# Patient Record
Sex: Female | Born: 1935
Health system: Southern US, Community
[De-identification: ages and names within clinical notes are randomized; demographics above are authoritative.]

## PROBLEM LIST (undated history)

## (undated) DIAGNOSIS — K589 Irritable bowel syndrome without diarrhea: Secondary | ICD-10-CM

## (undated) DIAGNOSIS — M1990 Unspecified osteoarthritis, unspecified site: Secondary | ICD-10-CM

## (undated) DIAGNOSIS — C50919 Malignant neoplasm of unspecified site of unspecified female breast: Secondary | ICD-10-CM

## (undated) DIAGNOSIS — K52831 Collagenous colitis: Secondary | ICD-10-CM

## (undated) DIAGNOSIS — I10 Essential (primary) hypertension: Secondary | ICD-10-CM

## (undated) DIAGNOSIS — H3530 Unspecified macular degeneration: Secondary | ICD-10-CM

## (undated) DIAGNOSIS — E039 Hypothyroidism, unspecified: Secondary | ICD-10-CM

## (undated) DIAGNOSIS — I809 Phlebitis and thrombophlebitis of unspecified site: Secondary | ICD-10-CM

## (undated) DIAGNOSIS — B029 Zoster without complications: Secondary | ICD-10-CM

## (undated) DIAGNOSIS — G629 Polyneuropathy, unspecified: Secondary | ICD-10-CM

## (undated) DIAGNOSIS — N951 Menopausal and female climacteric states: Secondary | ICD-10-CM

## (undated) DIAGNOSIS — C50911 Malignant neoplasm of unspecified site of right female breast: Secondary | ICD-10-CM

## (undated) DIAGNOSIS — R002 Palpitations: Secondary | ICD-10-CM

## (undated) DIAGNOSIS — E785 Hyperlipidemia, unspecified: Secondary | ICD-10-CM

## (undated) DIAGNOSIS — R9431 Abnormal electrocardiogram [ECG] [EKG]: Secondary | ICD-10-CM

## (undated) DIAGNOSIS — K5730 Diverticulosis of large intestine without perforation or abscess without bleeding: Secondary | ICD-10-CM

## (undated) DIAGNOSIS — K644 Residual hemorrhoidal skin tags: Secondary | ICD-10-CM

## (undated) HISTORY — DX: Polyneuropathy, unspecified: G62.9

## (undated) HISTORY — DX: Phlebitis and thrombophlebitis of unspecified site: I80.9

## (undated) HISTORY — DX: Palpitations: R00.2

## (undated) HISTORY — DX: Residual hemorrhoidal skin tags: K64.4

## (undated) HISTORY — PX: CARPAL TUNNEL RELEASE: SHX101

## (undated) HISTORY — DX: Abnormal electrocardiogram (ECG) (EKG): R94.31

## (undated) HISTORY — DX: Unspecified osteoarthritis, unspecified site: M19.90

## (undated) HISTORY — DX: Zoster without complications: B02.9

## (undated) HISTORY — DX: Collagenous colitis: K52.831

## (undated) HISTORY — PX: TONSILLECTOMY AND ADENOIDECTOMY: SHX28

## (undated) HISTORY — PX: BACK SURGERY: SHX140

## (undated) HISTORY — PX: VAGINAL HYSTERECTOMY: SUR661

## (undated) HISTORY — DX: Unspecified macular degeneration: H35.30

## (undated) HISTORY — DX: Essential (primary) hypertension: I10

## (undated) HISTORY — DX: Hypothyroidism, unspecified: E03.9

## (undated) HISTORY — DX: Diverticulosis of large intestine without perforation or abscess without bleeding: K57.30

## (undated) HISTORY — DX: Malignant neoplasm of unspecified site of unspecified female breast: C50.919

## (undated) HISTORY — PX: NEUROPLASTY / TRANSPOSITION MEDIAN NERVE AT CARPAL TUNNEL BILATERAL: SUR894

## (undated) HISTORY — DX: Hyperlipidemia, unspecified: E78.5

## (undated) HISTORY — PX: BREAST LUMPECTOMY: SHX2

## (undated) HISTORY — DX: Irritable bowel syndrome, unspecified: K58.9

## (undated) HISTORY — DX: Malignant neoplasm of unspecified site of right female breast: C50.911

## (undated) HISTORY — DX: Menopausal and female climacteric states: N95.1

---

## 1999-03-14 ENCOUNTER — Other Ambulatory Visit: Admission: RE | Admit: 1999-03-14 | Discharge: 1999-03-14 | Payer: Self-pay | Admitting: Gastroenterology

## 2001-03-25 ENCOUNTER — Ambulatory Visit (HOSPITAL_COMMUNITY): Admission: RE | Admit: 2001-03-25 | Discharge: 2001-03-25 | Payer: Self-pay | Admitting: Family Medicine

## 2001-03-25 ENCOUNTER — Encounter: Payer: Self-pay | Admitting: Family Medicine

## 2001-06-06 ENCOUNTER — Encounter: Admission: RE | Admit: 2001-06-06 | Discharge: 2001-07-03 | Payer: Self-pay | Admitting: Neurosurgery

## 2001-12-10 ENCOUNTER — Encounter: Admission: RE | Admit: 2001-12-10 | Discharge: 2001-12-26 | Payer: Self-pay | Admitting: Internal Medicine

## 2002-11-06 LAB — HM COLONOSCOPY

## 2002-12-03 HISTORY — PX: COLONOSCOPY: SHX174

## 2004-08-03 ENCOUNTER — Encounter: Payer: Self-pay | Admitting: Family Medicine

## 2004-09-27 ENCOUNTER — Encounter: Admission: RE | Admit: 2004-09-27 | Discharge: 2004-11-29 | Payer: Self-pay | Admitting: Family Medicine

## 2005-10-30 ENCOUNTER — Encounter
Admission: RE | Admit: 2005-10-30 | Discharge: 2005-12-03 | Payer: Self-pay | Admitting: Physical Medicine and Rehabilitation

## 2005-12-04 ENCOUNTER — Encounter
Admission: RE | Admit: 2005-12-04 | Discharge: 2006-01-04 | Payer: Self-pay | Admitting: Physical Medicine and Rehabilitation

## 2008-07-14 ENCOUNTER — Encounter: Admission: RE | Admit: 2008-07-14 | Discharge: 2008-08-06 | Payer: Self-pay | Admitting: Family Medicine

## 2008-08-07 ENCOUNTER — Encounter: Admission: RE | Admit: 2008-08-07 | Discharge: 2008-09-22 | Payer: Self-pay | Admitting: Family Medicine

## 2008-08-13 ENCOUNTER — Encounter: Admission: RE | Admit: 2008-08-13 | Discharge: 2008-08-13 | Payer: Self-pay | Admitting: General Surgery

## 2008-09-08 ENCOUNTER — Encounter (INDEPENDENT_AMBULATORY_CARE_PROVIDER_SITE_OTHER): Payer: Self-pay | Admitting: General Surgery

## 2008-09-08 ENCOUNTER — Encounter: Admission: RE | Admit: 2008-09-08 | Discharge: 2008-09-08 | Payer: Self-pay | Admitting: General Surgery

## 2008-09-08 ENCOUNTER — Ambulatory Visit (HOSPITAL_COMMUNITY): Admission: RE | Admit: 2008-09-08 | Discharge: 2008-09-08 | Payer: Self-pay | Admitting: General Surgery

## 2008-10-12 ENCOUNTER — Ambulatory Visit: Admission: RE | Admit: 2008-10-12 | Discharge: 2008-12-01 | Payer: Self-pay | Admitting: Radiation Oncology

## 2008-11-05 LAB — HM PAP SMEAR

## 2010-08-02 LAB — HM MAMMOGRAPHY

## 2010-09-12 ENCOUNTER — Ambulatory Visit: Payer: MEDICARE | Attending: Family Medicine | Admitting: Physical Therapy

## 2010-09-12 DIAGNOSIS — R5381 Other malaise: Secondary | ICD-10-CM | POA: Insufficient documentation

## 2010-09-12 DIAGNOSIS — M6281 Muscle weakness (generalized): Secondary | ICD-10-CM | POA: Insufficient documentation

## 2010-09-12 DIAGNOSIS — R293 Abnormal posture: Secondary | ICD-10-CM | POA: Insufficient documentation

## 2010-09-12 DIAGNOSIS — IMO0001 Reserved for inherently not codable concepts without codable children: Secondary | ICD-10-CM | POA: Insufficient documentation

## 2010-09-16 ENCOUNTER — Ambulatory Visit: Payer: MEDICARE | Admitting: *Deleted

## 2010-09-20 ENCOUNTER — Ambulatory Visit: Payer: MEDICARE | Admitting: Physical Therapy

## 2010-09-22 ENCOUNTER — Ambulatory Visit: Payer: MEDICARE | Admitting: Physical Therapy

## 2010-09-26 ENCOUNTER — Ambulatory Visit: Payer: MEDICARE | Admitting: Physical Therapy

## 2010-09-29 ENCOUNTER — Ambulatory Visit: Payer: MEDICARE | Admitting: Physical Therapy

## 2010-10-03 ENCOUNTER — Ambulatory Visit: Payer: MEDICARE | Admitting: Physical Therapy

## 2010-10-06 ENCOUNTER — Ambulatory Visit: Payer: MEDICARE | Attending: Family Medicine | Admitting: Physical Therapy

## 2010-10-06 DIAGNOSIS — M6281 Muscle weakness (generalized): Secondary | ICD-10-CM | POA: Insufficient documentation

## 2010-10-06 DIAGNOSIS — R5381 Other malaise: Secondary | ICD-10-CM | POA: Insufficient documentation

## 2010-10-06 DIAGNOSIS — IMO0001 Reserved for inherently not codable concepts without codable children: Secondary | ICD-10-CM | POA: Insufficient documentation

## 2010-10-06 DIAGNOSIS — R293 Abnormal posture: Secondary | ICD-10-CM | POA: Insufficient documentation

## 2010-10-10 ENCOUNTER — Ambulatory Visit: Payer: MEDICARE | Admitting: Physical Therapy

## 2010-10-12 ENCOUNTER — Ambulatory Visit: Payer: MEDICARE | Admitting: Physical Therapy

## 2010-10-17 ENCOUNTER — Ambulatory Visit: Payer: MEDICARE | Admitting: Physical Therapy

## 2010-10-20 ENCOUNTER — Ambulatory Visit: Payer: MEDICARE | Admitting: Physical Therapy

## 2010-10-24 ENCOUNTER — Ambulatory Visit: Payer: MEDICARE | Admitting: Physical Therapy

## 2010-10-26 ENCOUNTER — Ambulatory Visit: Payer: MEDICARE | Admitting: Physical Therapy

## 2010-10-31 ENCOUNTER — Ambulatory Visit: Payer: MEDICARE | Admitting: Physical Therapy

## 2010-11-03 ENCOUNTER — Ambulatory Visit: Payer: MEDICARE | Admitting: Physical Therapy

## 2010-11-05 ENCOUNTER — Encounter: Payer: Self-pay | Admitting: Family Medicine

## 2010-11-05 DIAGNOSIS — K529 Noninfective gastroenteritis and colitis, unspecified: Secondary | ICD-10-CM

## 2010-11-05 DIAGNOSIS — I809 Phlebitis and thrombophlebitis of unspecified site: Secondary | ICD-10-CM | POA: Insufficient documentation

## 2010-11-05 DIAGNOSIS — E039 Hypothyroidism, unspecified: Secondary | ICD-10-CM

## 2010-11-05 DIAGNOSIS — E785 Hyperlipidemia, unspecified: Secondary | ICD-10-CM | POA: Insufficient documentation

## 2010-11-05 DIAGNOSIS — M810 Age-related osteoporosis without current pathological fracture: Secondary | ICD-10-CM | POA: Insufficient documentation

## 2010-11-05 DIAGNOSIS — R9431 Abnormal electrocardiogram [ECG] [EKG]: Secondary | ICD-10-CM

## 2010-11-05 DIAGNOSIS — R5383 Other fatigue: Secondary | ICD-10-CM | POA: Insufficient documentation

## 2010-11-05 DIAGNOSIS — R002 Palpitations: Secondary | ICD-10-CM | POA: Insufficient documentation

## 2010-11-05 DIAGNOSIS — K589 Irritable bowel syndrome without diarrhea: Secondary | ICD-10-CM | POA: Insufficient documentation

## 2010-11-05 DIAGNOSIS — N951 Menopausal and female climacteric states: Secondary | ICD-10-CM | POA: Insufficient documentation

## 2010-11-05 DIAGNOSIS — I119 Hypertensive heart disease without heart failure: Secondary | ICD-10-CM | POA: Insufficient documentation

## 2010-11-21 LAB — DIFFERENTIAL
Basophils Absolute: 0.1 10*3/uL (ref 0.0–0.1)
Basophils Relative: 2 % — ABNORMAL HIGH (ref 0–1)
Lymphocytes Relative: 27 % (ref 12–46)
Neutro Abs: 3.6 10*3/uL (ref 1.7–7.7)
Neutrophils Relative %: 56 % (ref 43–77)

## 2010-11-21 LAB — URINALYSIS, ROUTINE W REFLEX MICROSCOPIC
Nitrite: NEGATIVE
Protein, ur: NEGATIVE mg/dL
Specific Gravity, Urine: 1.01 (ref 1.005–1.030)
Urobilinogen, UA: 0.2 mg/dL (ref 0.0–1.0)

## 2010-11-21 LAB — CBC
HCT: 46.3 % — ABNORMAL HIGH (ref 36.0–46.0)
Hemoglobin: 15.1 g/dL — ABNORMAL HIGH (ref 12.0–15.0)
MCV: 95.2 fL (ref 78.0–100.0)
Platelets: 199 10*3/uL (ref 150–400)
RDW: 13.7 % (ref 11.5–15.5)

## 2010-11-21 LAB — URINE MICROSCOPIC-ADD ON

## 2010-11-21 LAB — CANCER ANTIGEN 27.29: CA 27.29: 23 U/mL (ref 0–39)

## 2010-11-21 LAB — COMPREHENSIVE METABOLIC PANEL
Albumin: 4 g/dL (ref 3.5–5.2)
Alkaline Phosphatase: 88 U/L (ref 39–117)
BUN: 16 mg/dL (ref 6–23)
Creatinine, Ser: 0.94 mg/dL (ref 0.4–1.2)
Glucose, Bld: 103 mg/dL — ABNORMAL HIGH (ref 70–99)
Potassium: 4.3 mEq/L (ref 3.5–5.1)
Total Bilirubin: 0.8 mg/dL (ref 0.3–1.2)
Total Protein: 7.3 g/dL (ref 6.0–8.3)

## 2010-12-20 NOTE — Op Note (Signed)
NAMEMAYGEN, [name], M.D.DATE OF BIRTH:  Nov 02, 1921   DATE OF PROCEDURE:  09/08/2008  DATE OF DISCHARGE:  09/08/2008                               OPERATIVE REPORT   PREOPERATIVE DIAGNOSIS:  Invasive breast cancer, right breast.   POSTOPERATIVE DIAGNOSIS:  Invasive breast cancer, right breast.   OPERATION PERFORMED:  1. Inject blue dye, right breast.  2. Right partial mastectomy with needle localization.  3. Right axillary sentinel lymph node biopsy.   SURGEON:  Angelia Mould. Derrell Lolling, MD   OPERATIVE INDICATIONS:  This is a fairly healthy 77 year old white  female who had a screening mammogram which identified an 8-mm density  with calcifications in the right breast at 10 o'clock position, 3 cm  from the nipple.  Further imaging studies were done.  Ultrasound-guided  biopsy was performed and it showed invasive ductal carcinoma which  turned out to be estrogen receptor-positive.  She subsequently had an  MRI which suggested a 1-cm spiculated mass at 10 o'clock position in the  right breast and also some enhancement contiguous with an extending 1.7  cm deep to the superior margin of the mass, suspicious for DCIS.  There  was no axillary or internal mammary chain lymphadenopathy and the breast  abnormality was a solitary finding.  I counseled her extensively as an  outpatient on 2 separate occasions.  She clearly wanted to have breast  conservation surgery.  I felt that was appropriate.  She is brought to  operating room electively.   OPERATIVE TECHNIQUE:  The patient underwent needle localization by Dr.  Cain Saupe at the [facility] this morning and that  wire was very satisfactorily placed.  She was brought to [facility]  where she underwent injection of the right  retroareolar area with  radionuclide by the nuclear medicine technician.  She was taken to the  operating room and underwent general anesthesia.  After an alcohol prep,  we injected the right retroareolar area with 5 mL of blue dye which was  2 mL of methylene blue mixed with 3 mL of saline.  The breast was  massaged for 5 minutes.  we then held surgical time-out identifying the  correct patient, correct procedure, and correct site.  The right chest,  breast wall, axilla, and shoulder were then prepped and draped in a  sterile fashion.  Marcaine 0.5% with epinephrine was used a local  infiltration anesthetic.   We used a NeoProbe and we identified an area of increased radioactivity  very low in the right axilla.  We made a transverse incision overlying  this area and dissected down through the subcutaneous tissue.  We  incised the clavipectoral fascia and on further exploration, we found a  single small lymph node probably less than 5 mm in diameter.  This was  the only sentinel lymph node that we could identify.  We sent it to the  lab.  Dr. Colonel Bald did frozen section and said this was negative for cancer  cells.   I then planned the breast incision.  The localizing wire was inserted  laterally and directed superiorly.  I marked and then made a curved  incision in the outer aspect of the right breast from about the 9  o'clock to about 11 o'clock position.  This was in general parallel to  the areolar margin.  Dissection was carried down into the breast tissue  around localizing wire.  Because of the MRI findings suggesting DCIS, I  took the dissection all the way down to the pectoralis fascia.  The  specimen was marked with the six color margin marker kit.  The specimen  was sent to Dr. Deboraha Sprang at the [facility] [place].  Dr. Deboraha Sprang  called back and stated that the specimen looked good and that we seemed  to have everything.  Specimen was then sent to pathology.   Both incisions  were irrigated with saline.  Hemostasis was excellent in  both incisions.  In the axillary incision, we closed the clavipectoral  fascia and deep subcutaneous tissue with interrupted sutures of 3-0  Vicryl and the skin with a running suture of 4-0 Monocryl and Steri-  Strips.  In the breast incisions, we only closed the superficial  subcutaneous tissue with interrupted sutures of 3-0 Vicryl and the skin  with a running subcuticular suture of 4-0 Monocryl and Steri-Strips.  Clean bandages were placed.  A 6-inch Ace wrap was placed around the  chest.  The patient tolerated the procedure well and was taken recovery  room in stable condition.  Estimated blood loss was about 20 mL.  Complications were none.  Sponge, needle, and instrument counts were  correct.      Angelia Mould. Derrell Lolling, M.D.  Electronically Signed     HMI/MEDQ  D:  09/08/2008  T:  09/09/2008  Job:  [id number]   cc:   Ernestina Penna, M.D.

## 2010-12-27 ENCOUNTER — Encounter (INDEPENDENT_AMBULATORY_CARE_PROVIDER_SITE_OTHER): Payer: Self-pay | Admitting: General Surgery

## 2010-12-27 DIAGNOSIS — M1990 Unspecified osteoarthritis, unspecified site: Secondary | ICD-10-CM

## 2010-12-27 DIAGNOSIS — H9190 Unspecified hearing loss, unspecified ear: Secondary | ICD-10-CM | POA: Insufficient documentation

## 2010-12-27 DIAGNOSIS — H6690 Otitis media, unspecified, unspecified ear: Secondary | ICD-10-CM

## 2010-12-27 DIAGNOSIS — N63 Unspecified lump in unspecified breast: Secondary | ICD-10-CM

## 2011-08-31 ENCOUNTER — Ambulatory Visit (INDEPENDENT_AMBULATORY_CARE_PROVIDER_SITE_OTHER): Payer: Self-pay | Admitting: General Surgery

## 2011-09-26 ENCOUNTER — Ambulatory Visit (INDEPENDENT_AMBULATORY_CARE_PROVIDER_SITE_OTHER): Payer: Medicare Other | Admitting: General Surgery

## 2011-09-28 ENCOUNTER — Ambulatory Visit (INDEPENDENT_AMBULATORY_CARE_PROVIDER_SITE_OTHER): Payer: Medicare Other | Admitting: General Surgery

## 2011-09-28 ENCOUNTER — Encounter (INDEPENDENT_AMBULATORY_CARE_PROVIDER_SITE_OTHER): Payer: Self-pay | Admitting: General Surgery

## 2011-09-28 VITALS — BP 138/80 | HR 90 | Temp 99.0°F | Ht 63.5 in | Wt 121.0 lb

## 2011-09-28 DIAGNOSIS — C50911 Malignant neoplasm of unspecified site of right female breast: Secondary | ICD-10-CM

## 2011-09-28 DIAGNOSIS — C50919 Malignant neoplasm of unspecified site of unspecified female breast: Secondary | ICD-10-CM

## 2011-09-28 HISTORY — DX: Malignant neoplasm of unspecified site of right female breast: C50.911

## 2011-09-28 NOTE — Progress Notes (Signed)
Patient ID: Janice Reed, female   DOB: [birth year], 78 y.o.   MRN: [id number]  Chief Complaint  Patient presents with  . Breast Cancer Long Term Follow Up    R. PM/SLN 09/08/2008;   T2,N0 (IHC+), ER+, Her-2 neg.    HPI Janice Reed is a 78 y.o. female.  She returns for long-term followup of her right breast cancer.  This patient was originally diagnosed with invasive cancer in the right breast 3 years ago. She underwent right partial mastectomy and sentinel lobe biopsy. Date of surgery was September 08, 2008. Final pathology report revealed a T2, N0 (IHC-positive), receptor positive, HER-2-negative, 2.1 cm tumor. She had adjuvant radiation therapy. Dr. Cleone Slim has been following her. He elected not to treat her with antiestrogen therapy. She has done well and has no known recurrence to date.  She has no complaints about her breast. Her health has been stable. She is followed by [facility].  Mammograms performed at the right center in the evening on August 15, 2011 looked fine. HPI  Past Medical History  Diagnosis Date  . Benign hypertensive heart disease   . Other and unspecified hyperlipidemia   . Prolapse of vaginal walls without mention of uterine prolapse   . Phlebitis and thrombophlebitis of unspecified site   . Symptomatic menopausal or female climacteric states   . Malaise and fatigue   . Palpitations   . Nonspecific abnormal electrocardiogram (ECG) (EKG)   . Colitis   . Unspecified hypothyroidism   . Osteoporosis   . IBS (irritable bowel syndrome)   . Arthritis   . Cancer   . Hypertension   . Hearing loss   . Leg swelling   . Cough   . Arthritis pain     Past Surgical History  Procedure Date  . Back surgery   . Vaginal hysterectomy   . Tonsilectomy, adenoidectomy, bilateral myringotomy and tubes   . Neuroplasty / transposition median nerve at carpal tunnel bilateral   . Breast lumpectomy     per medical history form dated 09/27/09.    Family History  Problem Relation  Age of Onset  . Heart disease Mother     Heart failure per medical history form dated 09/27/09.  [name] Kitchen Heart disease Father     Heart attack per medical history form dated 09/27/09.  [name] Kitchen Heart disease Brother     Heart failure per medical history form dated 09/27/09.  . Stroke Brother   . Stroke Brother   . Leukemia Brother     Social History History  Substance Use Topics  . Smoking status: Never Smoker   . Smokeless tobacco: Not on file  . Alcohol Use: No    Allergies  Allergen Reactions  . Benicar (Olmesartan Medoxomil)   . Biaxin   . Celebrex (Celecoxib)   . Fosamax   . Penicillins     Current Outpatient Prescriptions  Medication Sig Dispense Refill  . aspirin 81 MG EC tablet Take 81 mg by mouth daily.       [name] Kitchen atorvastatin (LIPITOR) 20 MG tablet Take 20 mg by mouth daily.        . benazepril (LOTENSIN) 10 MG tablet Take 10 mg by mouth daily.        . Calcium-Magnesium-Vitamin D (CITRACAL CALCIUM+D PO) Take 1 capsule by mouth 2 (two) times daily.        . cholecalciferol (VITAMIN D) 400 UNITS TABS Take 1,000 Units by mouth daily.      [name] Kitchen levothyroxine (SYNTHROID,  LEVOTHROID) 100 MCG tablet Take 100 mcg by mouth daily.        . Multiple Vitamins-Minerals (ICAPS AREDS FORMULA PO) Take by mouth 2 (two) times daily.      . Omega-3 Fatty Acids (FISH OIL) 1000 MG CAPS Take 2 capsules by mouth daily.        . risedronate (ACTONEL) 35 MG tablet Take 35 mg by mouth every 7 (seven) days. with water on empty stomach, nothing by mouth or lie down for next 30 minutes.       . triamterene-hydrochlorothiazide (DYAZIDE) 37.5-25 MG per capsule Take 1 capsule by mouth. Take 1/2 tab qam         Review of Systems Review of Systems  Constitutional: Negative for fever, chills and unexpected weight change.  HENT: Negative for hearing loss, congestion, sore throat, trouble swallowing and voice change.   Eyes: Negative for visual disturbance.  Respiratory: Negative for cough and wheezing.     Cardiovascular: Negative for chest pain, palpitations and leg swelling.  Gastrointestinal: Negative for nausea, vomiting, abdominal pain, diarrhea, constipation, blood in stool, abdominal distention and anal bleeding.  Genitourinary: Negative for hematuria, vaginal bleeding and difficulty urinating.  Musculoskeletal: Negative for arthralgias.  Skin: Negative for rash and wound.  Neurological: Negative for seizures, syncope and headaches.  Hematological: Negative for adenopathy. Does not bruise/bleed easily.  Psychiatric/Behavioral: Negative for confusion.    Blood pressure 138/80, pulse 90, temperature 99 F (37.2 C), temperature source Temporal, height 5' 3.5" (1.613 m), weight 121 lb (54.885 kg), SpO2 97.00%.  Physical Exam Physical Exam  Constitutional: She is oriented to person, place, and time. She appears well-developed and well-nourished. No distress.  HENT:  Head: Normocephalic and atraumatic.  Nose: Nose normal.  Neck: Neck supple. No JVD present. No tracheal deviation present. No thyromegaly present.  Cardiovascular: Normal rate, regular rhythm, normal heart sounds and intact distal pulses.   No murmur heard. Pulmonary/Chest: Effort normal and breath sounds normal. No respiratory distress. She has no wheezes. She has no rales. She exhibits no tenderness.    Musculoskeletal: She exhibits no edema and no tenderness.  Lymphadenopathy:    She has no cervical adenopathy.  Neurological: She is alert and oriented to person, place, and time. She exhibits normal muscle tone. Coordination normal.  Skin: Skin is warm. No rash noted. She is not diaphoretic. No erythema. No pallor.  Psychiatric: She has a normal mood and affect. Her behavior is normal. Judgment and thought content normal.    Data Reviewed I reviewed all of my old records, Dr. [name] Stakes recent note, and the mammogram report. Assessment    Invasive ductal carcinoma right breast, 2.1 cm, receptor positive,  HER-2-negative, pathologic stage T2, N0;   (IHC positive).  Status post right partial mastectomy and sentinel node biopsy September 08, 2008. No evidence of recurrence 3 years post op  S/p adjuvant radiation therapy.    Plan    Continue medical followup with Dr. Rudi Heap.  Continue medical oncology followup with Dr. Margo Common.  Return to see me in one year after you get her annual mammograms.       Angelia Mould. Derrell Lolling, M.D., [facility] General and Minimally invasive Surgery Breast and Colorectal Surgery Office:   [phone] Pager:   [phone]  09/28/2011, 10:24 AM

## 2011-09-28 NOTE — Patient Instructions (Signed)
Your physical exam and your mammograms are normal. There is no evidence of cancer.  Return to see me in one year after you get your annual mammograms.

## 2011-12-08 ENCOUNTER — Encounter: Payer: Self-pay | Admitting: Internal Medicine

## 2012-01-05 ENCOUNTER — Encounter: Payer: Self-pay | Admitting: Internal Medicine

## 2012-01-05 ENCOUNTER — Ambulatory Visit (INDEPENDENT_AMBULATORY_CARE_PROVIDER_SITE_OTHER): Payer: Medicare Other | Admitting: Internal Medicine

## 2012-01-05 ENCOUNTER — Ambulatory Visit: Payer: Medicare Other | Admitting: Internal Medicine

## 2012-01-05 VITALS — BP 150/70 | HR 72 | Ht 63.5 in | Wt 123.8 lb

## 2012-01-05 DIAGNOSIS — R195 Other fecal abnormalities: Secondary | ICD-10-CM

## 2012-01-05 NOTE — Patient Instructions (Signed)
Dr. Leone Payor recommends a colonoscopy and understands that you want to discuss this with your family first.  If you decide to do a colonoscopy call us back and we can set up a pre-visit to go over the details.  We are giving you a colonoscopy handout today.

## 2012-01-05 NOTE — Progress Notes (Signed)
Subjective:    Patient ID: Janice Reed, female    DOB: 10/15/1921, 78 y.o.   MRN: [id number]  HPI This is a delightful widowed elderly white woman who was found to have heme positive stool. She actually had immune based fecal occult blood testing in January of this year, and it was realized some month later that she had heme positive stool. A repeat immune fecal occult blood test was also positive. She has some hemorrhoids but does not note any rectal bleeding or melena. There is rare constipation. She is known to me from previous colonoscopy and a diagnosis of collagenous colitis as well as having diverticulosis and external hemorrhoids. There is no significant change in bowel habits. Sometimes she has some postprandial abdominal bloating and gas. The review of systems is otherwise negative.  She is otherwise doing reasonably well with a diagnosis of breast cancer about 3 years ago. She still lives at home alone and tries her own car. Allergies  Allergen Reactions  . Alendronate Sodium   . Benicar (Olmesartan Medoxomil)   . Celebrex (Celecoxib)   . Clarithromycin   . Penicillins    Outpatient Prescriptions Prior to Visit  Medication Sig Dispense Refill  . atorvastatin (LIPITOR) 20 MG tablet Take 20 mg by mouth daily.        . benazepril (LOTENSIN) 10 MG tablet Take 10 mg by mouth daily.        . Calcium-Magnesium-Vitamin D (CITRACAL CALCIUM+D PO) Take 1 capsule by mouth 2 (two) times daily.        [name] Kitchen levothyroxine (SYNTHROID, LEVOTHROID) 100 MCG tablet Take 100 mcg by mouth daily.        . Multiple Vitamins-Minerals (ICAPS AREDS FORMULA PO) Take by mouth 2 (two) times daily.      . Omega-3 Fatty Acids (FISH OIL) 1000 MG CAPS Take 2 capsules by mouth daily.        . risedronate (ACTONEL) 35 MG tablet Take 35 mg by mouth every 7 (seven) days. with water on empty stomach, nothing by mouth or lie down for next 30 minutes.       . triamterene-hydrochlorothiazide (DYAZIDE) 37.5-25 MG per capsule  Take 1 capsule by mouth. Take 1/2 tab qam       . aspirin 81 MG EC tablet Take 81 mg by mouth daily.       . cholecalciferol (VITAMIN D) 400 UNITS TABS Take 1,000 Units by mouth daily.       Past Medical History  Diagnosis Date  . Benign hypertensive heart disease   . Other and unspecified hyperlipidemia   . Prolapse of vaginal walls without mention of uterine prolapse   . Phlebitis and thrombophlebitis of unspecified site   . Symptomatic menopausal or female climacteric states   . Palpitations   . Nonspecific abnormal electrocardiogram (ECG) (EKG)   . Collagenous colitis   . Unspecified hypothyroidism   . Osteoporosis   . IBS (irritable bowel syndrome)   . Arthritis   . Breast cancer   . Hypertension   . Hearing loss   . Arthritis pain   . Neuropathy, peripheral   . Diverticulosis of colon   . External hemorrhoid   . Macular degeneration of both eyes    Past Surgical History  Procedure Date  . Back surgery   . Vaginal hysterectomy   . Tonsilectomy, adenoidectomy, bilateral myringotomy and tubes   . Neuroplasty / transposition median nerve at carpal tunnel bilateral   . Breast lumpectomy  per medical history form dated 09/27/09.  . Colonoscopy 12/03/2002    Dr. Stan Head  . Carpal tunnel release     bilateral   History   Social History  . Marital Status: Widowed    Spouse Name: N/A    Number of Children: 2  .     Occupational History  . retired-homemaker    Social History Main Topics  . Smoking status: Never Smoker   . Smokeless tobacco: None  . Alcohol Use: No  . Drug Use: No    Social History Narrative   Widowed, 1 son one daughter, retired and homemaker. No alcohol or caffeine or tobacco.   Family History  Problem Relation Age of Onset  . Heart disease Mother     Heart failure per medical history form dated 09/27/09.  [name] Kitchen Heart disease Father     Heart attack per medical history form dated 09/27/09.  [name] Kitchen Heart disease Brother     Heart failure per  medical history form dated 09/27/09.  . Stroke Brother   . Stroke Brother   . Leukemia Brother   . Colon cancer Neg Hx   . Breast cancer Cousin         Review of Systems Positive for reduced vision from macular degeneration, decreased hearing, some pedal edema, some muscle cramps, osteoarthritis symptoms and back pain. All other review of systems are negative or as per history of present illness.    Objective:   Physical Exam General:  Well-developed, well-nourished and in no acute distress Eyes:  anicteric. Other than arcus bilaterally ENT:   Mouth and posterior pharynx free of lesions. He does have dentures. Neck:   supple w/o thyromegaly or mass.  Lungs: Clear to auscultation bilaterally. Heart:  S1S2, no rubs, murmurs, gallops. Abdomen:  soft, non-tender, no hepatosplenomegaly, hernia, or mass and BS+.  Rectal: With female staff present there is no rectal mass, there are no obvious swollen hemorrhoids. Stool is brown. Lymph:  no cervical or supraclavicular adenopathy. Extremities:   no edema Skin   no rash. Neuro:  A&O x 3.  Psych:  appropriate mood and  Affect.   Data Reviewed: Immune fecal occult blood testing from 12-2011 is positive. Hemoglobin is 15 MCV is 94.7 on April 29. White count and platelets are normal.       Assessment & Plan:   1.  immune fecal occult blood test positive x2    I have recommended a colonoscopy to understand the cause of her heme positive stool. The test she has had is specific to a colonic source of hemoglobin. I've advised her of the risks and benefits of the procedure including bleeding, perforation, possible need for surgery, infection, medication reaction. She is elderly but overall fit. I think she would tolerate the procedure well though certainly can't guarantee that. However given that she said to heme positive stools, there could be a significant lesion other than hemorrhoids causing this and it could be helpful to detect this and  provide therapy.  She wants to discuss colonoscopy with her family prior to proceeding. She will think about it. I also mentioned the possibility of a CT colonoscopy though that would probably cost around $700 out of pocket. It is a reasonable option though it could still lead to an endoscopic evaluation.  She understands she could have colorectal cancer causing a heme positive stool.  I appreciate the opportunity to care for this patient.   CC: Rudi Heap, MD

## 2012-01-08 ENCOUNTER — Ambulatory Visit: Payer: Medicare Other | Admitting: Gastroenterology

## 2012-02-19 ENCOUNTER — Ambulatory Visit (INDEPENDENT_AMBULATORY_CARE_PROVIDER_SITE_OTHER): Payer: Medicare Other | Admitting: Internal Medicine

## 2012-02-19 ENCOUNTER — Ambulatory Visit: Payer: Medicare Other | Admitting: Gastroenterology

## 2012-02-19 ENCOUNTER — Encounter: Payer: Self-pay | Admitting: Internal Medicine

## 2012-02-19 VITALS — BP 144/80 | HR 64 | Ht 63.5 in | Wt 121.0 lb

## 2012-02-19 DIAGNOSIS — R195 Other fecal abnormalities: Secondary | ICD-10-CM

## 2012-02-19 MED ORDER — MOVIPREP 100 G PO SOLR: ORAL | Status: DC

## 2012-02-19 NOTE — Patient Instructions (Addendum)
You have been scheduled for a colonoscopy with propofol. Please follow written instructions given to you at your visit today.  Please pick up your prep kit at the pharmacy within the next 1-3 days. If you use inhalers (even only as needed), please bring them with you on the day of your procedure.   Thank you for choosing [facility] today. 

## 2012-02-19 NOTE — Progress Notes (Signed)
Patient ID: Janice Reed, female   DOB: 1922-02-01, 78 y.o.   MRN: [id number]  Patient had been previously seen, we discussed colonoscopy. She discussed this with her family and primary care doctor and is decided to proceed. She was not charge for this visit today, she was here just to schedule a colonoscopy that had previously been discussed.

## 2012-02-23 ENCOUNTER — Ambulatory Visit (AMBULATORY_SURGERY_CENTER): Payer: Medicare Other | Admitting: Internal Medicine

## 2012-02-23 ENCOUNTER — Encounter (INDEPENDENT_AMBULATORY_CARE_PROVIDER_SITE_OTHER): Payer: Self-pay

## 2012-02-23 ENCOUNTER — Encounter: Payer: Self-pay | Admitting: Internal Medicine

## 2012-02-23 VITALS — BP 152/74 | HR 77 | Temp 98.0°F | Resp 18 | Ht 64.0 in | Wt 121.0 lb

## 2012-02-23 DIAGNOSIS — K5730 Diverticulosis of large intestine without perforation or abscess without bleeding: Secondary | ICD-10-CM

## 2012-02-23 DIAGNOSIS — R195 Other fecal abnormalities: Secondary | ICD-10-CM

## 2012-02-23 DIAGNOSIS — K648 Other hemorrhoids: Secondary | ICD-10-CM

## 2012-02-23 MED ORDER — SODIUM CHLORIDE 0.9 % IV SOLN: 500.0000 mL | INTRAVENOUS | Status: DC

## 2012-02-23 NOTE — Progress Notes (Signed)
Patient did not experience any of the following events: a burn prior to discharge; a fall within the facility; wrong site/side/patient/procedure/implant event; or a hospital transfer or hospital admission upon discharge from the facility. (G8907) Patient did not have preoperative order for IV antibiotic SSI prophylaxis. (G8918)  

## 2012-02-23 NOTE — Patient Instructions (Addendum)
You have internal hemorrhoids. That must be where the blood on the test came from. You have diverticulosis also.  No signs of polyps or cancer.  You may see me as needed.  Thank you for choosing me and [facility].  Iva Boop, MD, FACG  YOU HAD AN ENDOSCOPIC PROCEDURE TODAY AT THE  ENDOSCOPY CENTER: Refer to the procedure report that was given to you for any specific questions about what was found during the examination.  If the procedure report does not answer your questions, please call your gastroenterologist to clarify.  If you requested that your care partner not be given the details of your procedure findings, then the procedure report has been included in a sealed envelope for you to review at your convenience later.  YOU SHOULD EXPECT: Some feelings of bloating in the abdomen. Passage of more gas than usual.  Walking can help get rid of the air that was put into your GI tract during the procedure and reduce the bloating. If you had a lower endoscopy (such as a colonoscopy or flexible sigmoidoscopy) you may notice spotting of blood in your stool or on the toilet paper. If you underwent a bowel prep for your procedure, then you may not have a normal bowel movement for a few days.  DIET: Your first meal following the procedure should be a light meal and then it is ok to progress to your normal diet.  A half-sandwich or bowl of soup is an example of a good first meal.  Heavy or fried foods are harder to digest and may make you feel nauseous or bloated.  Likewise meals heavy in dairy and vegetables can cause extra gas to form and this can also increase the bloating.  Drink plenty of fluids but you should avoid alcoholic beverages for 24 hours.  ACTIVITY: Your care partner should take you home directly after the procedure.  You should plan to take it easy, moving slowly for the rest of the day.  You can resume normal activity the day after the procedure however you should  NOT DRIVE or use heavy machinery for 24 hours (because of the sedation medicines used during the test).    SYMPTOMS TO REPORT IMMEDIATELY: A gastroenterologist can be reached at any hour.  During normal business hours, 8:30 AM to 5:00 PM Monday through Friday, call [phone].  After hours and on weekends, please call the GI answering service at [phone] who will take a message and have the physician on call contact you.   Following lower endoscopy (colonoscopy or flexible sigmoidoscopy):  Excessive amounts of blood in the stool  Significant tenderness or worsening of abdominal pains  Swelling of the abdomen that is new, acute  Fever of 100F or higher  Following upper endoscopy (EGD)  Vomiting of blood or coffee ground material  New chest pain or pain under the shoulder blades  Painful or persistently difficult swallowing  New shortness of breath  Fever of 100F or higher  Black, tarry-looking stools  FOLLOW UP: If any biopsies were taken you will be contacted by phone or by letter within the next 1-3 weeks.  Call your gastroenterologist if you have not heard about the biopsies in 3 weeks.  Our staff will call the home number listed on your records the next business day following your procedure to check on you and address any questions or concerns that you may have at that time regarding the information given to you following your procedure.  This is a courtesy call and so if there is no answer at the home number and we have not heard from you through the emergency physician on call, we will assume that you have returned to your regular daily activities without incident.  SIGNATURES/CONFIDENTIALITY: You and/or your care partner have signed paperwork which will be entered into your electronic medical record.  These signatures attest to the fact that that the information above on your After Visit Summary has been reviewed and is understood.  Full responsibility of the confidentiality  of this discharge information lies with you and/or your care-partner.   Handouts on hemorrhoids, diverticulosis, high fiber diet

## 2012-02-23 NOTE — Op Note (Signed)
[facility] [address] [place], [place]  [address]  COLONOSCOPY PROCEDURE REPORT  PATIENT:  Janice Reed, Janice Reed  MR#:  [id number] BIRTHDATE:  12/14/1921, 89 yrs. old  GENDER:  female ENDOSCOPIST:  Iva Boop, MD, [facility]  PROCEDURE DATE:  02/23/2012 PROCEDURE:  Colonoscopy 62130 ASA CLASS:  Class III INDICATIONS:  heme positive stool iFOBT + x 2 MEDICATIONS:   These medications were titrated to patient response per physician's verbal order, MAC sedation, administered by CRNA, propofol (Diprivan) 30 mg IV  DESCRIPTION OF PROCEDURE:   After the risks benefits and alternatives of the procedure were thoroughly explained, informed consent was obtained.  Digital rectal exam was performed and revealed no abnormalities.   The LB [id number] endoscope was introduced through the anus and advanced to the cecum, which was identified by both the appendix and ileocecal valve, without limitations.  The quality of the prep was excellent, using MoviPrep.  The instrument was then slowly withdrawn as the colon was fully examined. <<PROCEDUREIMAGES>>  FINDINGS:  Severe diverticulosis was found in the sigmoid colon. This was otherwise a normal examination of the colon.   Retroflexed views in the rectum revealed internal hemorrhoids.  Moderate to large.  The time to cecum = 4:52 minutes. The scope was then withdrawn in 5:00 minutes from the cecum and the procedure completed. COMPLICATIONS:  None ENDOSCOPIC IMPRESSION: 1) Severe diverticulosis in the sigmoid colon 2) Internal hemorrhoids 3) Otherwise normal examination  Follow-up as needed. No further screening needed at 89.  Iva Boop, MD, [facility]  CC:  Rudi Heap, MD and The Patient  n. eSIGNED:   Iva Boop at 02/23/2012 11:16 AM  Kristeen Miss, [phone]

## 2012-02-26 ENCOUNTER — Telehealth: Payer: Self-pay

## 2012-02-26 NOTE — Telephone Encounter (Signed)
  Follow up Call-  Call back number 02/23/2012  Post procedure Call Back phone  # [phone]  Permission to leave phone message Yes     Patient questions:  Do you have a fever, pain , or abdominal swelling? no Pain Score  0 *  Have you tolerated food without any problems? yes  Have you been able to return to your normal activities? yes  Do you have any questions about your discharge instructions: Diet   no Medications  no Follow up visit  no  Do you have questions or concerns about your Care? no  Actions: * If pain score is 4 or above: No action needed, pain <4.

## 2012-03-05 ENCOUNTER — Encounter: Payer: Medicare Other | Admitting: Hematology and Oncology

## 2012-03-05 DIAGNOSIS — C50919 Malignant neoplasm of unspecified site of unspecified female breast: Secondary | ICD-10-CM

## 2012-10-01 ENCOUNTER — Encounter (INDEPENDENT_AMBULATORY_CARE_PROVIDER_SITE_OTHER): Payer: Self-pay | Admitting: General Surgery

## 2012-10-01 ENCOUNTER — Ambulatory Visit (INDEPENDENT_AMBULATORY_CARE_PROVIDER_SITE_OTHER): Payer: Medicare Other | Admitting: General Surgery

## 2012-10-01 VITALS — BP 124/70 | HR 68 | Temp 98.0°F | Resp 18 | Ht 64.0 in | Wt 120.0 lb

## 2012-10-01 DIAGNOSIS — C50919 Malignant neoplasm of unspecified site of unspecified female breast: Secondary | ICD-10-CM

## 2012-10-01 DIAGNOSIS — C50911 Malignant neoplasm of unspecified site of right female breast: Secondary | ICD-10-CM

## 2012-10-01 NOTE — Patient Instructions (Signed)
Your breast exam and lymph node exam today is normal. There is no evidence of cancer.  Your recent mammograms and even are reportedly normal, and I will review this.  Return to see Dr. Derrell Lolling in one year after you get your annual mammograms.

## 2012-10-01 NOTE — Progress Notes (Signed)
Patient ID: Janice Reed, female   DOB: [birth year], 79 y.o.   MRN: [id number] History: This patient returns for long-term followup regarding her right breast cancer. On 09/08/2008 she underwent right partial mastectomy and SLN biopsy for a cancer in the central right breast. Pathologic stage T2, N0, (IHC-positive). Receptor positive, HER-2-negative, 2.1 cm tumor. She had adjuvant radiation therapy and Dr. Cleone Slim decided that no further treatment was necessary She has no complaints about her breast. She feels things are stable Most recent mammograms at [facility] cancer center dated 08/20/2012 looked fine. No focal abnormality. Category 2.  Exam: Patient looks well. No distress. Neck reveals no adenopathy or mass or JVD. Lungs: Clear to auscultation bilaterally Heart: Regular rate and rhythm. No ectopy. No murmur Breast: Somewhat atrophic. Soft. Transverse scar in upper outer right breast. No palpable mass either breast. No adenopathy  ROS: 10 system review of systems is negative except as described above  Assessment: Invasive carcinoma right breast, central, 2.1 cm tumor, receptor positive, HER-2-negative, stage T2, N0.(IHC+) No evidence of recurrence 4 years following right partial mastectomy and sentinel node biopsy and adjuvant radiation therapy  Plan: Repeat mammograms in 1 year. Return to see me in one year for breast exam. I told her we could consider discontinuing mammograms and routine exams at age 31, if she desired. She wanted to continue followup at this time. Continue routine medical followup with Dr. Vernon Prey in [place].   Angelia Mould. Derrell Lolling, M.D., [facility] General and Minimally invasive Surgery Breast and Colorectal Surgery Office:   [phone] Pager:   [phone]

## 2012-10-15 ENCOUNTER — Other Ambulatory Visit: Payer: Self-pay | Admitting: *Deleted

## 2012-10-15 DIAGNOSIS — M949 Disorder of cartilage, unspecified: Secondary | ICD-10-CM

## 2012-10-30 ENCOUNTER — Ambulatory Visit (INDEPENDENT_AMBULATORY_CARE_PROVIDER_SITE_OTHER): Payer: Medicare Other

## 2012-10-30 ENCOUNTER — Other Ambulatory Visit: Payer: Self-pay

## 2012-10-30 ENCOUNTER — Ambulatory Visit (INDEPENDENT_AMBULATORY_CARE_PROVIDER_SITE_OTHER): Payer: Medicare Other | Admitting: Pharmacist

## 2012-10-30 ENCOUNTER — Encounter: Payer: Self-pay | Admitting: Pharmacist

## 2012-10-30 VITALS — Ht 62.1 in | Wt 123.0 lb

## 2012-10-30 DIAGNOSIS — M949 Disorder of cartilage, unspecified: Secondary | ICD-10-CM

## 2012-10-30 DIAGNOSIS — M810 Age-related osteoporosis without current pathological fracture: Secondary | ICD-10-CM

## 2012-10-30 DIAGNOSIS — M899 Disorder of bone, unspecified: Secondary | ICD-10-CM

## 2012-10-30 NOTE — Progress Notes (Signed)
Patient ID: Janice Reed, female   DOB: 05/16/1922, 79 y.o.   MRN: [id number]  Osteoporosis Visit Filed Vitals:   10/30/12 1247  Height: 5' 2.1" (1.577 m)  Weight: 123 lb (55.792 kg)   Max Lifetime Height:  5'6"   HPI: Does pt already have a diagnosis of:  Osteopenia?  No  Osteoporosis?  Yes  Back Pain?  Yes   History of discectomy 1960's and dx of scoliosis    Kyphosis?  Yes Prior fracture?  Yes- lumbar Med(s) for Osteoporosis/Osteopenia:  Actonel 35mg  QW Med(s) previously tried for Osteoporosis/Osteopenia:  Alendronate - unable to tolerate due to esophagitis                                                            PMH: Age at menopause:  79yo Hysterectomy?  Yes Oophorectomy?  Yes HRT? Yes - Former.  Type/duration: estrace/8 years Steroid Use?  No Thyroid med?  Yes History of cancer?  Breast cancer 2010 - radiation to breast History of digestive disorders (ie Crohn's)?  No Current or previous eating disorders?  No   FH/SH: Family history of osteoporosis?  No Parent with history of hip fracture?  No Family history of breast cancer?  No Exercise?  Yes -  [employer] yoga class Caffeine?  No Smoking?  No Alcohol?  No    Calcium Assessment Calcium Intake  # of servings/day  Calcium mg  Milk (8 oz) 1  x  300  = 300  Yogurt (8 oz) 0 x  400 = 0  Cheese (1 oz) 0 x  200 = 0  Non dairy sources   250 mg  Ca supplement 600mg  bid = 1200mg    Estimated calcium intake per day 1550mg /day    DEXA Results Date of Test T-Score for AP Spine L1-L4 T-Score for Total Left Hip T-Score for Total Right Hip  10/30/2012 -1.3 -3.1 -3.4  09/07/2010 -1.6 -2.8 -3.2  05/13/2008 -1.6 -2.8 -2.9  04/30/2006 -1.6 -2.7 -2.9    Assessment: Osteoporosis with continued decreased in BMD  Recommendations: Will look into insurance coverage of Prolia - if cost agreeable to patient then will start Prolia For now will continue actonel 35mg  weekly Continue weight bearing exercise and yoga Educate on fall  preventtion - counseling and educational materials provided Recheck DEXA:  2 years  Time spent counseling patient:  .

## 2012-10-31 ENCOUNTER — Other Ambulatory Visit: Payer: Self-pay | Admitting: Pharmacist

## 2012-10-31 DIAGNOSIS — M25551 Pain in right hip: Secondary | ICD-10-CM

## 2012-11-11 ENCOUNTER — Telehealth: Payer: Self-pay | Admitting: *Deleted

## 2012-11-11 NOTE — Telephone Encounter (Signed)
Pt wants to wait on ortho referral, hip pain is no worse Please cancel appt

## 2012-11-27 ENCOUNTER — Telehealth: Payer: Self-pay | Admitting: Family Medicine

## 2012-11-27 NOTE — Telephone Encounter (Signed)
Patient says she received a notification saying that prolia had been approved for her.

## 2012-11-27 NOTE — Telephone Encounter (Signed)
Wants [facility] to call Has questions and would like to talk to Dr Christell Constant

## 2012-11-28 NOTE — Telephone Encounter (Signed)
Patient's insurance [employer] has approved Prolia but not sure what co pay will be. I will see if our ins dept can find out.

## 2012-11-29 NOTE — Telephone Encounter (Signed)
Janice Reed -  Called [employer] spoke with [employer] rep Anne Hahn - she stated patients responsibility for Prolia will be $6.35. I asked Zaida to reverify the patients cost and she stated $6.35 was correct.

## 2012-12-02 NOTE — Telephone Encounter (Signed)
Tried to call to discuss Prolia co-pay - LM on VM

## 2012-12-10 ENCOUNTER — Telehealth: Payer: Self-pay | Admitting: Pharmacist

## 2012-12-10 NOTE — Telephone Encounter (Signed)
Patient was contacted regarding Prolia.  Prolia has been approved through [employer].  $6.35 copay. Patient still has 4 weeks for Actonel left.  She will call to have Prolia ordered after she finishes Actonel.  Once she receives Prolia she is instructed to refrigerate and call office for appt for administration.

## 2013-01-27 ENCOUNTER — Encounter: Payer: Self-pay | Admitting: Family Medicine

## 2013-01-27 ENCOUNTER — Ambulatory Visit (INDEPENDENT_AMBULATORY_CARE_PROVIDER_SITE_OTHER): Payer: Medicare Other | Admitting: Family Medicine

## 2013-01-27 VITALS — BP 170/83 | HR 88 | Temp 97.7°F | Ht 62.5 in | Wt 121.4 lb

## 2013-01-27 DIAGNOSIS — R5383 Other fatigue: Secondary | ICD-10-CM

## 2013-01-27 DIAGNOSIS — I10 Essential (primary) hypertension: Secondary | ICD-10-CM

## 2013-01-27 DIAGNOSIS — E785 Hyperlipidemia, unspecified: Secondary | ICD-10-CM

## 2013-01-27 DIAGNOSIS — M25559 Pain in unspecified hip: Secondary | ICD-10-CM

## 2013-01-27 LAB — POCT CBC
Granulocyte percent: 66.8 %G (ref 37–80)
Lymph, poc: 1.6 (ref 0.6–3.4)
MCV: 91.8 fL (ref 80–97)
MPV: 9.7 fL (ref 0–99.8)
Platelet Count, POC: 142 10*3/uL (ref 142–424)
RBC: 4.5 M/uL (ref 4.04–5.48)

## 2013-01-27 LAB — LIPID PANEL
HDL: 69 mg/dL (ref >39)
LDL Cholesterol: 67 mg/dL (ref 0–99)
Total CHOL/HDL Ratio: 2.2 Ratio

## 2013-01-27 LAB — HEPATIC FUNCTION PANEL
Bilirubin, Direct: 0.1 mg/dL (ref 0.0–0.3)
Indirect Bilirubin: 0.3 mg/dL (ref 0.0–0.9)

## 2013-01-27 NOTE — Patient Instructions (Addendum)
Continue to be careful and not fall Try ibuprofen after breakfast and take Zantac before breakfast Change aspirin to after supper Hold the Lipitor for a couple weeks and to see if joint pains get any better when not taking this medication Stay as active as possible Drink plenty of fluids

## 2013-01-27 NOTE — Progress Notes (Signed)
  Subjective:    Patient ID: Janice Reed, female    DOB: 09/15/1921, 79 y.o.   MRN: [id number]  HPI The patient returns today for followup of chronic medical problems. Her health maintenance is up to date. See review of systems. Patient recently received shots in both wrists and she still having wrist pain bilaterally. She was also having hip knee and ankle pain which seems to be increased more recently. She complains of just generalized weakness.   Review of Systems  Constitutional: Positive for fatigue (increased).  HENT: Negative.   Eyes: Negative.   Respiratory: Negative.   Cardiovascular: Positive for leg swelling (L lower leg).  Gastrointestinal: Negative.   Endocrine: Negative.   Genitourinary: Negative.   Musculoskeletal: Positive for back pain (LBP) and arthralgias (bilateral legs and hands, arthritis).  Skin: Negative.   Allergic/Immunologic: Negative.   Neurological: Negative.   Psychiatric/Behavioral: Negative for sleep disturbance. The patient is nervous/anxious (slight).        Objective:   Physical Exam BP 170/83  Pulse 88  Temp(Src) 97.7 F (36.5 C) (Oral)  Ht 5' 2.5" (1.588 m)  Wt 121 lb 6.4 oz (55.067 kg)  BMI 21.84 kg/m2  Repeat blood pressure in the left arm was 152/84  The patient appeared well nourished and normally developed, kyphotic, alert and oriented to time and place. Speech, behavior and judgement appear normal. Vital signs as documented.  Head exam is unremarkable. No scleral icterus or pallor noted. External auditory canals were clear with a hearing aid in the right EAC. Mouth and throat were normal. Neck is without jugular venous distension, thyromegally, or carotid bruits. Carotid upstrokes are brisk bilaterally. No cervical adenopathy. Lungs are clear anteriorly and posteriorly to auscultation. Normal respiratory effort. Cardiac exam reveals regular rate and rhythm at 72 per minute. First and second heart sounds normal.  No murmurs, rubs or  gallops.  Abdominal exam reveals normal bowl sounds, no masses, no organomegaly and no aortic enlargement. No inguinal adenopathy. There is no epigastric or suprapubic tenderness. Extremities are minimally edematous and both femoral and pedal pulses are normal. There were quite a few varicose veins in both ankles and distal lower extremities. As mentioned above she is somewhat kyphotic and has some scoliosis. Skin without pallor or jaundice.  Warm and dry, without rash. Neurologic exam reveals normal deep tendon reflexes and normal sensation.          Assessment & Plan:  1. Fatigue - POCT CBC; Standing - Vitamin D 25 hydroxy; Standing  2. Hyperlipemia - Hepatic function panel; Standing - Lipid panel; Standing  3. Hypertension - POCT CBC; Standing - BASIC METABOLIC PANEL WITH GFR; Standing  4. Chronic arthralgias of knees and hips, unspecified laterality   Patient Instructions  Continue to be careful and not fall Try ibuprofen after breakfast and take Zantac before breakfast Change aspirin to after supper Hold the Lipitor for a couple weeks and to see if joint pains get any better when not taking this medication Stay as active as possible Drink plenty of fluids     Patient will meet with clinical pharmacist soon to get started on Prolia Continue to monitor blood pressures at home

## 2013-01-27 NOTE — Addendum Note (Signed)
Addended by: Orma Render F on: 01/27/2013 04:08 PM   Modules accepted: Orders

## 2013-01-28 LAB — BASIC METABOLIC PANEL WITH GFR
BUN: 24 mg/dL — ABNORMAL HIGH (ref 6–23)
Chloride: 98 mEq/L (ref 96–112)
GFR, Est African American: 61 mL/min
Glucose, Bld: 98 mg/dL (ref 70–99)
Potassium: 4.8 mEq/L (ref 3.5–5.3)

## 2013-02-04 ENCOUNTER — Telehealth: Payer: Self-pay | Admitting: Pharmacist

## 2013-02-05 NOTE — Telephone Encounter (Signed)
Prolia is being shipped from mail order pharmacy.  She will call when she receives to set up appt to have administered.

## 2013-02-12 ENCOUNTER — Telehealth: Payer: Self-pay | Admitting: Pharmacist

## 2013-02-12 NOTE — Telephone Encounter (Signed)
Patient has dental procedure coming up - appt not scheduled yet.  Wants to wait to start Prolia until after this procedure.  This is OK.  Patient to let me know when ready to have Prolia injection given.

## 2013-02-14 ENCOUNTER — Encounter (HOSPITAL_COMMUNITY): Payer: Self-pay | Admitting: *Deleted

## 2013-02-14 ENCOUNTER — Emergency Department (HOSPITAL_COMMUNITY): Payer: Medicare Other

## 2013-02-14 ENCOUNTER — Ambulatory Visit (INDEPENDENT_AMBULATORY_CARE_PROVIDER_SITE_OTHER): Payer: Medicare Other | Admitting: Family Medicine

## 2013-02-14 ENCOUNTER — Encounter: Payer: Self-pay | Admitting: Family Medicine

## 2013-02-14 ENCOUNTER — Emergency Department (HOSPITAL_COMMUNITY)
Admission: EM | Admit: 2013-02-14 | Discharge: 2013-02-14 | Disposition: A | Discharge status: Home / Self Care | Payer: Medicare Other | Attending: Emergency Medicine | Admitting: Emergency Medicine

## 2013-02-14 VITALS — BP 169/85 | HR 97 | Temp 97.1°F | Ht 62.5 in | Wt 120.0 lb

## 2013-02-14 DIAGNOSIS — M25569 Pain in unspecified knee: Secondary | ICD-10-CM

## 2013-02-14 DIAGNOSIS — M79609 Pain in unspecified limb: Secondary | ICD-10-CM

## 2013-02-14 DIAGNOSIS — M810 Age-related osteoporosis without current pathological fracture: Secondary | ICD-10-CM | POA: Insufficient documentation

## 2013-02-14 DIAGNOSIS — Z880 Allergy status to penicillin: Secondary | ICD-10-CM | POA: Insufficient documentation

## 2013-02-14 DIAGNOSIS — E785 Hyperlipidemia, unspecified: Secondary | ICD-10-CM | POA: Insufficient documentation

## 2013-02-14 DIAGNOSIS — M129 Arthropathy, unspecified: Secondary | ICD-10-CM | POA: Insufficient documentation

## 2013-02-14 DIAGNOSIS — Z79899 Other long term (current) drug therapy: Secondary | ICD-10-CM | POA: Insufficient documentation

## 2013-02-14 DIAGNOSIS — Z7982 Long term (current) use of aspirin: Secondary | ICD-10-CM | POA: Insufficient documentation

## 2013-02-14 DIAGNOSIS — M25562 Pain in left knee: Secondary | ICD-10-CM

## 2013-02-14 DIAGNOSIS — I119 Hypertensive heart disease without heart failure: Secondary | ICD-10-CM | POA: Insufficient documentation

## 2013-02-14 DIAGNOSIS — M7989 Other specified soft tissue disorders: Secondary | ICD-10-CM | POA: Insufficient documentation

## 2013-02-14 DIAGNOSIS — Z8742 Personal history of other diseases of the female genital tract: Secondary | ICD-10-CM | POA: Insufficient documentation

## 2013-02-14 DIAGNOSIS — Z853 Personal history of malignant neoplasm of breast: Secondary | ICD-10-CM | POA: Insufficient documentation

## 2013-02-14 DIAGNOSIS — Z8719 Personal history of other diseases of the digestive system: Secondary | ICD-10-CM | POA: Insufficient documentation

## 2013-02-14 DIAGNOSIS — E039 Hypothyroidism, unspecified: Secondary | ICD-10-CM | POA: Insufficient documentation

## 2013-02-14 DIAGNOSIS — Z8669 Personal history of other diseases of the nervous system and sense organs: Secondary | ICD-10-CM | POA: Insufficient documentation

## 2013-02-14 DIAGNOSIS — H9190 Unspecified hearing loss, unspecified ear: Secondary | ICD-10-CM | POA: Insufficient documentation

## 2013-02-14 DIAGNOSIS — Z8679 Personal history of other diseases of the circulatory system: Secondary | ICD-10-CM | POA: Insufficient documentation

## 2013-02-14 LAB — D-DIMER, QUANTITATIVE: D-Dimer, Quant: 5.4 ug/mL-FEU — ABNORMAL HIGH (ref 0.00–0.48)

## 2013-02-14 MED ORDER — TRAMADOL HCL 50 MG PO TABS: 50.0000 mg | ORAL_TABLET | Freq: Three times a day (TID) | ORAL | Status: DC | PRN

## 2013-02-14 NOTE — ED Provider Notes (Addendum)
History    CSN: [id number] Arrival date & time 02/14/13  1755  First MD Initiated Contact with Patient 02/14/13 1834     Chief Complaint  Patient presents with  . Leg Pain   (Consider location/radiation/quality/duration/timing/severity/associated sxs/prior Treatment) Patient is a 79 y.o. female presenting with leg pain. The history is provided by the patient.  Leg Pain Associated symptoms: no back pain and no fever    Patient referred in by their primary care Dr. is Dr. Christell Constant. Patient with left leg swelling and pain for several weeks. Every care Dr. get concerned about deep vein thrombosis and referred her in. Pain is mostly left knee and left ankle area swelling is mostly around the left ankle no traumatic injury known to either one of them for history of arthritis. Patient states that the pain is 5/10 in both areas. Denies any shortness of breath or chest pain. Sounds as if the primary care Dr. Janae Bridgeman have had a positive d-dimer but were not certain. Pain is described as sharp and achy mostly an ache has stated nonradiating.  Past Medical History  Diagnosis Date  . Benign hypertensive heart disease   . Other and unspecified hyperlipidemia   . Prolapse of vaginal walls without mention of uterine prolapse   . Phlebitis and thrombophlebitis of unspecified site   . Symptomatic menopausal or female climacteric states   . Palpitations   . Nonspecific abnormal electrocardiogram (ECG) (EKG)   . Collagenous colitis   . Unspecified hypothyroidism   . Osteoporosis   . IBS (irritable bowel syndrome)   . Arthritis   . Breast cancer   . Hypertension   . Hearing loss   . Arthritis pain   . Neuropathy, peripheral   . Diverticulosis of colon   . External hemorrhoid   . Macular degeneration of both eyes    Past Surgical History  Procedure Laterality Date  . Back surgery    . Vaginal hysterectomy    . Neuroplasty / transposition median nerve at carpal tunnel bilateral    . Breast  lumpectomy      per medical history form dated 09/27/09.  . Colonoscopy  12/03/2002    Dr. Stan Head  . Carpal tunnel release      bilateral  . Tonsillectomy and adenoidectomy     Family History  Problem Relation Age of Onset  . Heart disease Mother     Heart failure per medical history form dated 09/27/09.  [name] Kitchen Heart disease Father     Heart attack per medical history form dated 09/27/09.  [name] Kitchen Heart disease Brother     Heart failure per medical history form dated 09/27/09.  . Stroke Brother   . Stroke Brother   . Leukemia Brother   . Colon cancer Neg Hx   . Breast cancer Cousin    History  Substance Use Topics  . Smoking status: Never Smoker   . Smokeless tobacco: Never Used  . Alcohol Use: No   OB History   Grav Para Term Preterm Abortions TAB SAB Ect Mult Living                 Review of Systems  Constitutional: Negative for fever.  HENT: Negative for congestion.   Eyes: Negative for visual disturbance.  Respiratory: Negative for shortness of breath.   Cardiovascular: Positive for leg swelling. Negative for chest pain.  Gastrointestinal: Negative for nausea, vomiting and abdominal pain.  Genitourinary: Negative for dysuria.  Musculoskeletal: Negative for back pain.  Skin: Negative for rash.  Neurological: Negative for headaches.  Hematological: Does not bruise/bleed easily.  Psychiatric/Behavioral: Negative for confusion.    Allergies  Alendronate sodium; Benicar; Celebrex; Clarithromycin; Sulfa antibiotics; and Penicillins  Home Medications   Current Outpatient Rx  Name  Route  Sig  Dispense  Refill  . Aflibercept (EYLEA IO)   Intraocular   Inject into the eye every 30 (thirty) days.          [name] Kitchen aspirin 325 MG tablet   Oral   Take 325 mg by mouth See admin instructions. Take one capsule a day and more if needed for pain - up to 4 times daily         . atorvastatin (LIPITOR) 20 MG tablet   Oral   Take 20 mg by mouth every evening.          .  benazepril (LOTENSIN) 10 MG tablet   Oral   Take 10 mg by mouth daily.           . bevacizumab (AVASTIN) 1.25 mg/0.1 mL SOLN   Intravitreal   1.25 mg by Intravitreal route every 30 (thirty) days.          . Calcium-Magnesium-Vitamin D (CITRACAL CALCIUM+D PO)   Oral   Take 1 capsule by mouth 2 (two) times daily.           . Cholecalciferol (VITAMIN D3) 1000 UNITS CAPS   Oral   Take 1,000 Units by mouth 2 (two) times daily.          [name] Kitchen levothyroxine (SYNTHROID, LEVOTHROID) 100 MCG tablet   Oral   Take 100 mcg by mouth daily.          . Multiple Vitamins-Minerals (ICAPS AREDS FORMULA PO)   Oral   Take 1 capsule by mouth 2 (two) times daily.          . Omega-3 Fatty Acids (FISH OIL) 1000 MG CAPS   Oral   Take 1,000 mg by mouth 2 (two) times daily.          . ranitidine (ZANTAC) 150 MG tablet   Oral   Take 150 mg by mouth 2 (two) times daily as needed for heartburn.         . triamterene-hydrochlorothiazide (MAXZIDE-25) 37.5-25 MG per tablet   Oral   Take 0.5 tablets by mouth daily.         [name] Kitchen PROLIA 60 MG/ML SOLN injection               . traMADol (ULTRAM) 50 MG tablet   Oral   Take 1 tablet (50 mg total) by mouth every 8 (eight) hours as needed for pain.   30 tablet   0    BP 178/91  Pulse 85  Temp(Src) 98.5 F (36.9 C) (Oral)  Resp 16  SpO2 95% Physical Exam  Nursing note and vitals reviewed. Constitutional: She is oriented to person, place, and time. She appears well-developed and well-nourished. No distress.  HENT:  Head: Normocephalic and atraumatic.  Mouth/Throat: Oropharynx is clear and moist.  Eyes: Conjunctivae are normal. Pupils are equal, round, and reactive to light.  Neck: Normal range of motion. Neck supple.  Cardiovascular: Normal rate, regular rhythm and intact distal pulses.   No murmur heard. Pulmonary/Chest: Effort normal and breath sounds normal. No respiratory distress.  Abdominal: Soft. Bowel sounds are normal. There  is no tenderness.  Musculoskeletal: Normal range of motion. She exhibits edema and tenderness.  Swelling to left  leg and ankle. No redness no increased warmth. No effusion to the left knee. Dorsalis pedis pulses 1+. Good cap refill. Sensations intact. No bruising.  Lymphadenopathy:    She has no cervical adenopathy.  Neurological: She is alert and oriented to person, place, and time. No cranial nerve deficit. She exhibits normal muscle tone. Coordination normal.  Skin: Skin is warm. No erythema.    ED Course  Procedures (including critical care time) Labs Reviewed - No data to display Dg Ankle Complete Left  02/14/2013   *RADIOLOGY REPORT*  Clinical Data: Leg pain  LEFT ANKLE COMPLETE - 3+ VIEW  Comparison: None  Findings: Ankle joint appears normal.  No fracture is seen.  No significant degenerative change.  Phleboliths are present in the calf.  IMPRESSION: Negative ankle.   Original Report Authenticated By: Janeece Riggers, M.D.   Dg Knee Complete 4 Views Left  02/14/2013   *RADIOLOGY REPORT*  Clinical Data: Leg pain  LEFT KNEE - COMPLETE 4+ VIEW  Comparison: None.  Findings: Negative for fracture.  Chondrocalcinosis is present in the medial and lateral joint compartment.  Patella appears normal.  IMPRESSION: Chondrocalcinosis.  Negative for fracture.   Original Report Authenticated By: Janeece Riggers, M.D.   X-rays of the knee and ankle on the left are negative for any acute injuries. Doppler studies are pending. Current time is 7:58 PM.   1. Left leg swelling     MDM  Patient referred in by primary care for rule out of left leg DVT. Doppler study is pending. Patient without any chest pain or shortness of breath nothing consistent with pulmonary embolus. X-rays of the area also been ordered since patient is known to have some arthritic changes and does have some tenderness mostly at the left knee and left ankle to rule out any occult injury. Disposition will be based on the x-rays and the  Doppler study.  Shelda Jakes, MD 02/14/13 1948  Shelda Jakes, MD 02/14/13 1958

## 2013-02-14 NOTE — ED Provider Notes (Signed)
Chi Woodham S  8:00 PM patient discussed in sign out. Patient sent from PCP office to rule out DVT in left lower leg. Vascular ultrasound pending. Plain film x-rays unremarkable.  9:00 PM patient resting comfortably. She has returned from vascular lab. Preliminary report does not show any evidence of DVT. There was a small ruptured Baker's cyst. Patient does report having some pains to the popliteal area. There is no significant swelling at this time. I have explained the x-ray and ultrasound findings to the patient. She does express long history of bilateral foot and ankle swelling particularly in the summer times however the left extremity is worse than any prior time. We discussed treatment including elevation, rest, compression stockings and anti-inflammatories. She had arty been advised by Dr. Christell Constant, PCP to take his Zantac for her stomach in the morning before breakfast and after breakfast use Advil for her arthritis. She also takes aspirin 325 mg in the evening before bed. She is in agreement with the plan and will return home at this time.     VASCULAR LAB  PRELIMINARY PRELIMINARY PRELIMINARY PRELIMINARY  Left lower extremity venous duplex completed.  Preliminary report: Left: No evidence of DVT or superficial thrombosis. There is a small ruptured Baker's cyst in the popliteal fossa coursing to the most proximal calf  SLAUGHTER, [place], RVS  02/14/2013, 8:18 PM   Angus Seller, PA-C 02/14/13 2131

## 2013-02-14 NOTE — ED Notes (Signed)
Pt in c/o left lower leg pain, states the pain moves around from the top of her knee to her ankle area, also behind knee, states she went to her PCP today and they did blood work, called her back tonight and told to her to come in to ED for further evaluation for a blood clot.

## 2013-02-14 NOTE — Progress Notes (Signed)
D-Dimer is elevated at 5.4.  Dr. Alvester Morin notified and recommended that patient go to ED.    Discussed results with patient and instructed her to have someone drive her to [facility] or [facility] ED.  She stated understanding and agreement to plan.  She will f/u as planned and PRN.

## 2013-02-14 NOTE — Progress Notes (Signed)
VASCULAR LAB PRELIMINARY  PRELIMINARY  PRELIMINARY  PRELIMINARY  Left lower extremity venous duplex completed.    Preliminary report:  Left:  No evidence of DVT or superficial thrombosis. There is a small ruptured Baker's cyst in the popliteal fossa coursing to the most proximal calf  Nestor Wieneke, RVS 02/14/2013, 8:18 PM

## 2013-02-14 NOTE — Progress Notes (Signed)
  Subjective:    Patient ID: Janice Reed, female    DOB: Jul 14, 1922, 79 y.o.   MRN: [id number]  HPI This is a pleasant 79 year old female presents today with chief complaint of left knee and leg pain. Patient reports a history of chronic joint pain the setting of osteoarthritis. Patient states she's had some left knee as well as ankle pain over the past 2-4 weeks. This is an acute on chronic issue.  Patient denies any trauma. Pain is predominantly in the knee and is associated with prolonged standing. Has had some popliteal tenderness albeit minimal. Patient also has some mild left ankle pain. Has been diagnosed with venous stasis related pain in her blood sugars in the past. Patient feels ankle pain is related to this. No distal numbness or paresthesias. Patient has been able to ambulate at baseline although knee pain does seem to be exacerbated by prolonged standing. Knee pain is predominantly in the anterolateral knee compartment. Baseline hx/o scoliosis.    Review of Systems  All other systems reviewed and are negative.       Objective:   Physical Exam  Constitutional: She appears well-developed and well-nourished.  HENT:  Head: Normocephalic and atraumatic.  Eyes: Conjunctivae are normal. Pupils are equal, round, and reactive to light.  Neck: Normal range of motion.  Cardiovascular: Normal rate and regular rhythm.   Pulmonary/Chest: Effort normal and breath sounds normal.  Abdominal: Soft.  Musculoskeletal:       Legs: Neurological: She is alert.  Skin: Skin is warm.          Assessment & Plan:  Knee pain, left - Plan: D-dimer, quantitative, Uric acid, traMADol (ULTRAM) 50 MG tablet   Suspect that pain is likely secondary to osteoarthritis flare. Patient does have some minimal popliteal tenderness. Well score of 1-2 at best. We'll check a d-dimer correlate. Also the differential diagnosis includes gout. Resections include thiazide diuretic use. We'll check a  uric acid. Given age. We'll hold off on any conflicting medications such as high-dose NSAIDs her prednisone given GI red flags in setting of high dose ASA use and DeBeers criteria. We'll start patient on low-dose tramadol for pain pending blood work.  discuss musculoskeletal red flags. Followup with primary care provider 1-2 weeks.

## 2013-02-15 NOTE — ED Provider Notes (Signed)
Medical screening examination/treatment/procedure(s) were performed by non-physician practitioner and as supervising physician I was immediately available for consultation/collaboration.  Lyanne Co, MD 02/15/13 [phone]

## 2013-02-18 ENCOUNTER — Telehealth: Payer: Self-pay | Admitting: Pharmacist

## 2013-02-19 ENCOUNTER — Telehealth: Payer: Self-pay | Admitting: *Deleted

## 2013-02-19 NOTE — Telephone Encounter (Signed)
Message copied by Bearl Mulberry on Wed Feb 19, 2013  7:11 PM ------      Message from: Ernestina Penna      Created: Mon Jan 27, 2013  7:12 PM       The CBC had a normal white blood cell count and a normal hemoglobin at 14.5. The platelet count was adequate ------

## 2013-02-19 NOTE — Telephone Encounter (Signed)
appt made for 02/24/13 at 2pm to have prolia injection given

## 2013-02-19 NOTE — Telephone Encounter (Signed)
Pt notified of results

## 2013-02-24 ENCOUNTER — Ambulatory Visit (INDEPENDENT_AMBULATORY_CARE_PROVIDER_SITE_OTHER): Payer: Medicare Other | Admitting: Pharmacist

## 2013-02-24 DIAGNOSIS — M810 Age-related osteoporosis without current pathological fracture: Secondary | ICD-10-CM

## 2013-02-24 NOTE — Progress Notes (Signed)
Patient came in for Prolia - but is unsure is she wants to take Prolia.  She had several questions and I answered them.   She is to see Dr. Christell Constant tomorrow and would like to discuss further with him

## 2013-02-25 ENCOUNTER — Ambulatory Visit (INDEPENDENT_AMBULATORY_CARE_PROVIDER_SITE_OTHER): Payer: Medicare Other | Admitting: Family Medicine

## 2013-02-25 ENCOUNTER — Encounter: Payer: Self-pay | Admitting: Family Medicine

## 2013-02-25 ENCOUNTER — Telehealth: Payer: Self-pay | Admitting: Family Medicine

## 2013-02-25 DIAGNOSIS — M25569 Pain in unspecified knee: Secondary | ICD-10-CM

## 2013-02-25 DIAGNOSIS — M25562 Pain in left knee: Secondary | ICD-10-CM

## 2013-02-25 DIAGNOSIS — M79609 Pain in unspecified limb: Secondary | ICD-10-CM

## 2013-02-25 NOTE — Patient Instructions (Addendum)
Continue support hose as doing We will set up a return visit with Tammy to get started on prolia for the osteoporosis

## 2013-02-25 NOTE — Progress Notes (Signed)
  Subjective:    Patient ID: Janice Reed, female    DOB: July 03, 1922, 79 y.o.   MRN: [id number]  HPI Patient comes the office today to evaluate  left lower leg swelling after making a trip to the emergency room on 02/14/2013. An ultrasound was done at the time and there was no sign of any blood clots.   Review of Systems  Cardiovascular: Positive for leg swelling (LLL, improving).  Skin: Positive for color change (some bruising LLL).       Objective:   Physical Exam  Constitutional: She is oriented to person, place, and time. She appears well-developed and well-nourished.  For her age  HENT:  Head: Normocephalic and atraumatic.  Eyes: Conjunctivae are normal. Right eye exhibits no discharge. Left eye exhibits no discharge. No scleral icterus.  Cardiovascular: Normal rate, regular rhythm and normal heart sounds.  Exam reveals no gallop and no friction rub.   No murmur heard. At 72 per minute  Pulmonary/Chest: Breath sounds normal. No respiratory distress.  Musculoskeletal: Normal range of motion. She exhibits tenderness. She exhibits no edema.  Homans sign is negative. Minimal edema. There was a small lump in the  Medial leg with some surrounding bruising. I feel this is a resolving small hematoma.  Neurological: She is alert and oriented to person, place, and time.  Skin: Skin is warm and dry.  Psychiatric: She has a normal mood and affect. Her behavior is normal. Judgment and thought content normal.          Assessment & Plan:  Lower leg pain, left  Knee pain, left  Patient Instructions  Continue support hose as doing We will set up a return visit with Tammy to get started on prolia for the osteoporosis   Nyra Capes MD

## 2013-02-26 NOTE — Telephone Encounter (Signed)
Patient called and appt made for 03/05/13 at 2:30pm

## 2013-03-05 ENCOUNTER — Ambulatory Visit (INDEPENDENT_AMBULATORY_CARE_PROVIDER_SITE_OTHER): Payer: Medicare Other | Admitting: Pharmacist

## 2013-03-05 DIAGNOSIS — M810 Age-related osteoporosis without current pathological fracture: Secondary | ICD-10-CM

## 2013-03-05 MED ORDER — DENOSUMAB 60 MG/ML ~~LOC~~ SOLN
60.0000 mg | Freq: Once | SUBCUTANEOUS | Status: AC
  Administered 2013-03-05: 60 mg via SUBCUTANEOUS

## 2013-03-05 NOTE — Progress Notes (Signed)
Patient ID: Janice Reed, female   DOB: [birth year], 79 y.o.   MRN: [id number]   Patient here for her first Prolia injection  She has osteoporosis .  Last Dexa was 10/30/2012.  She has taken Actonel in the past but BMD continued to decrease.   She is getting about 1550mg  of calcium daily and last vitmamin D level was normal at 59 on 01/28/2013 Also last GFR was 53 (01/28/2013)  Assessment;  Osteoporosis  Plan:  Prolia 60mg  given SQ in upper right arm.  Continue calcium at least 1200mg  daily  Weight bearing exercise as able daily Recheck Dexa 10/2014 Next Prolia due 09/05/2013  Henrene Pastor, PharmD, CPP

## 2013-03-12 ENCOUNTER — Ambulatory Visit: Payer: Medicare Other

## 2013-06-04 ENCOUNTER — Encounter: Payer: Self-pay | Admitting: Family Medicine

## 2013-06-04 ENCOUNTER — Other Ambulatory Visit: Payer: Medicare Other | Admitting: *Deleted

## 2013-06-04 ENCOUNTER — Ambulatory Visit (INDEPENDENT_AMBULATORY_CARE_PROVIDER_SITE_OTHER): Payer: Medicare Other | Admitting: Family Medicine

## 2013-06-04 VITALS — BP 154/81 | HR 84 | Temp 97.4°F | Ht 62.5 in | Wt 120.0 lb

## 2013-06-04 DIAGNOSIS — M25569 Pain in unspecified knee: Secondary | ICD-10-CM

## 2013-06-04 DIAGNOSIS — R5381 Other malaise: Secondary | ICD-10-CM

## 2013-06-04 DIAGNOSIS — M129 Arthropathy, unspecified: Secondary | ICD-10-CM

## 2013-06-04 DIAGNOSIS — M7120 Synovial cyst of popliteal space [Baker], unspecified knee: Secondary | ICD-10-CM

## 2013-06-04 DIAGNOSIS — E785 Hyperlipidemia, unspecified: Secondary | ICD-10-CM

## 2013-06-04 DIAGNOSIS — C50911 Malignant neoplasm of unspecified site of right female breast: Secondary | ICD-10-CM

## 2013-06-04 DIAGNOSIS — E559 Vitamin D deficiency, unspecified: Secondary | ICD-10-CM

## 2013-06-04 DIAGNOSIS — Z23 Encounter for immunization: Secondary | ICD-10-CM

## 2013-06-04 DIAGNOSIS — I119 Hypertensive heart disease without heart failure: Secondary | ICD-10-CM

## 2013-06-04 DIAGNOSIS — M1990 Unspecified osteoarthritis, unspecified site: Secondary | ICD-10-CM

## 2013-06-04 DIAGNOSIS — C50919 Malignant neoplasm of unspecified site of unspecified female breast: Secondary | ICD-10-CM

## 2013-06-04 DIAGNOSIS — M810 Age-related osteoporosis without current pathological fracture: Secondary | ICD-10-CM

## 2013-06-04 DIAGNOSIS — E039 Hypothyroidism, unspecified: Secondary | ICD-10-CM

## 2013-06-04 LAB — POCT CBC
Granulocyte percent: 59.1 %G (ref 37–80)
Hemoglobin: 14.6 g/dL (ref 12.2–16.2)
MCHC: 32.9 g/dL (ref 31.8–35.4)
MPV: 8.8 fL (ref 0–99.8)
POC Granulocyte: 3.3 (ref 2–6.9)
POC LYMPH PERCENT: 34.8 %L (ref 10–50)

## 2013-06-04 NOTE — Patient Instructions (Addendum)
Continue current medications. Continue good therapeutic lifestyle changes.  Fall precautions discussed with patient.f follow up as planned and earlier as needed. We will call you  when lab work is available We will make arrangements to have you see the orthopedic surgeon regarding your possible Baker's cyst of the left popliteal fossa

## 2013-06-04 NOTE — Progress Notes (Signed)
Subjective:    Patient ID: Janice Reed, female    DOB: 11-07-1921, 79 y.o.   MRN: [id number]  HPI Pt here for follow up and management of chronic medical problems.     Patient Active Problem List   Diagnosis Date Noted  . Internal hemorrhoids without mention of complication 02/23/2012  . Immune fecal occult blood test + x 2 01/05/2012  . Cancer of right breast 09/28/2011  . Hearing loss 12/27/2010  . Arthritis 12/27/2010  . Benign hypertensive heart disease   . Other and unspecified hyperlipidemia   . Phlebitis and thrombophlebitis of unspecified site   . Symptomatic menopausal or female climacteric states   . Malaise and fatigue   . Palpitations   . Nonspecific abnormal electrocardiogram (ECG) (EKG)   . Unspecified hypothyroidism   . Osteoporosis   . IBS (irritable bowel syndrome)    Outpatient Encounter Prescriptions as of 06/04/2013  Medication Sig Dispense Refill  . Aflibercept (EYLEA IO) Inject into the eye every 30 (thirty) days.       [name] Kitchen aspirin 325 MG tablet Take 325 mg by mouth See admin instructions. Take one capsule a day and more if needed for pain - up to 4 times daily      . benazepril (LOTENSIN) 10 MG tablet Take 10 mg by mouth daily.        . bevacizumab (AVASTIN) 1.25 mg/0.1 mL SOLN 1.25 mg by Intravitreal route every 30 (thirty) days.       . Calcium-Magnesium-Vitamin D (CITRACAL CALCIUM+D PO) Take 1 capsule by mouth 2 (two) times daily.        . Cholecalciferol (VITAMIN D3) 1000 UNITS CAPS Take 1,000 Units by mouth 2 (two) times daily.       [name] Kitchen levothyroxine (SYNTHROID, LEVOTHROID) 100 MCG tablet Take 100 mcg by mouth daily.       . Multiple Vitamins-Minerals (ICAPS AREDS FORMULA PO) Take 1 capsule by mouth 2 (two) times daily.       . Omega-3 Fatty Acids (FISH OIL) 1000 MG CAPS Take 1,000 mg by mouth 2 (two) times daily.       [name] Kitchen PROLIA 60 MG/ML SOLN injection       . ranitidine (ZANTAC) 150 MG tablet Take 150 mg by mouth 2 (two) times daily as needed for  heartburn.      . triamterene-hydrochlorothiazide (MAXZIDE-25) 37.5-25 MG per tablet Take 0.5 tablets by mouth daily.      [name] Kitchen atorvastatin (LIPITOR) 20 MG tablet Take 20 mg by mouth every evening.        No facility-administered encounter medications on file as of 06/04/2013.    Review of Systems  Constitutional: Negative.   HENT: Positive for hearing loss.   Eyes: Negative.   Respiratory: Negative.   Cardiovascular: Negative.   Gastrointestinal: Negative.   Endocrine: Negative.   Genitourinary: Negative.   Musculoskeletal: Positive for arthralgias (bilateral leg pain).  Skin: Negative.   Allergic/Immunologic: Negative.   Neurological: Negative.   Hematological: Negative.   Psychiatric/Behavioral: Negative.        Objective:   Physical Exam  Nursing note and vitals reviewed. Constitutional: She is oriented to person, place, and time. No distress.  Thin, appropriately dressed  HENT:  Head: Normocephalic and atraumatic.  Right Ear: External ear normal.  Left Ear: External ear normal.  Nose: Nose normal.  Mouth/Throat: Oropharynx is clear and moist. No oropharyngeal exudate.  Eyes: Conjunctivae and EOM are normal. Pupils are equal, round, and reactive to light. Right eye  exhibits no discharge. Left eye exhibits no discharge. No scleral icterus.  Neck: Normal range of motion. Neck supple. No thyromegaly present.  Cardiovascular: Normal rate, regular rhythm, normal heart sounds and intact distal pulses.  Exam reveals no gallop and no friction rub.   No murmur heard. At 84 per minute  Pulmonary/Chest: Effort normal and breath sounds normal. No respiratory distress. She has no wheezes. She has no rales.  Abdominal: Soft. Bowel sounds are normal. She exhibits no mass. There is no tenderness. There is no rebound and no guarding.  Musculoskeletal: Normal range of motion. She exhibits edema. She exhibits no tenderness.  Slight pedal edema bilaterally Patient has fullness in the left  popliteal fossa  Lymphadenopathy:    She has no cervical adenopathy.  Neurological: She is alert and oriented to person, place, and time. She has normal reflexes. No cranial nerve deficit.  Skin: Skin is warm and dry.  Psychiatric: She has a normal mood and affect. Her behavior is normal. Judgment and thought content normal.   BP 154/81  Pulse 84  Temp(Src) 97.4 F (36.3 C) (Oral)  Ht 5' 2.5" (1.588 m)  Wt 120 lb (54.432 kg)  BMI 21.59 kg/m2        Assessment & Plan:   1. Arthritis   2. Unspecified hypothyroidism   3. Osteoporosis   4. Vitamin D deficiency   5. Other and unspecified hyperlipidemia   6. Malaise and fatigue   7. Cancer of right breast   8. Benign hypertensive heart disease   9. Knee pain, unspecified laterality   10. Baker's cyst, unspecified laterality    Orders Placed This Encounter  Procedures  . Hepatic function panel    Standing Status: Future     Number of Occurrences: 1     Standing Expiration Date: 06/04/2014  . BMP8+EGFR    Standing Status: Future     Number of Occurrences: 1     Standing Expiration Date: 06/04/2014  . NMR, lipoprofile    Standing Status: Future     Number of Occurrences: 1     Standing Expiration Date: 06/04/2014  . Vit D  25 hydroxy (rtn osteoporosis monitoring)    Standing Status: Future     Number of Occurrences: 1     Standing Expiration Date: 06/04/2014  . Thyroid Panel With TSH    Standing Status: Future     Number of Occurrences: 1     Standing Expiration Date: 06/04/2014  . Ambulatory referral to Orthopedic Surgery    Referral Priority:  Routine    Referral Type:  Surgical    Referral Reason:  Specialty Services Required    Requested Specialty:  Orthopedic Surgery    Number of Visits Requested:  1  . POCT CBC    Standing Status: Future     Number of Occurrences: 1     Standing Expiration Date: 07/05/2013   No orders of the defined types were placed in this encounter.   Patient Instructions  Continue  current medications. Continue good therapeutic lifestyle changes.  Fall precautions discussed with patient.f follow up as planned and earlier as needed. We will call you  when lab work is available We will make arrangements to have you see the orthopedic surgeon regarding your possible Baker's cyst of the left popliteal fossa    Nyra Capes MD

## 2013-06-06 LAB — BMP8+EGFR
BUN/Creatinine Ratio: 25 (ref 11–26)
BUN: 24 mg/dL (ref 8–27)
GFR calc Af Amer: 61 mL/min/{1.73_m2} (ref >59)
GFR calc non Af Amer: 53 mL/min/{1.73_m2} — ABNORMAL LOW (ref >59)
Glucose: 100 mg/dL — ABNORMAL HIGH (ref 65–99)
Potassium: 4.9 mmol/L (ref 3.5–5.2)

## 2013-06-06 LAB — NMR, LIPOPROFILE
Cholesterol: 252 mg/dL — ABNORMAL HIGH (ref <200)
HDL Particle Number: 38.4 umol/L (ref >=30.5)
LDL Particle Number: 1714 nmol/L — ABNORMAL HIGH (ref <1000)
LDLC SERPL CALC-MCNC: 154 mg/dL — ABNORMAL HIGH (ref <100)
Triglycerides by NMR: 98 mg/dL (ref <150)

## 2013-06-06 LAB — HEPATIC FUNCTION PANEL
Albumin: 4.4 g/dL (ref 3.5–4.7)
Bilirubin, Direct: 0.09 mg/dL (ref 0.00–0.40)
Total Protein: 6.7 g/dL (ref 6.0–8.5)

## 2013-06-06 LAB — THYROID PANEL WITH TSH
T3 Uptake Ratio: 26 % (ref 24–39)
T4, Total: 11.4 ug/dL (ref 4.5–12.0)

## 2013-08-06 ENCOUNTER — Encounter: Payer: Self-pay | Admitting: *Deleted

## 2013-08-11 ENCOUNTER — Telehealth: Payer: Self-pay | Admitting: Pharmacist

## 2013-08-11 NOTE — Telephone Encounter (Signed)
Next prolia due 09/05/2013. Patient will order Prolia from [facility].   She already has appt to see Dr Laurance Flatten 09/11/13 so will administer Prolia at that appt. Patient called

## 2013-08-13 ENCOUNTER — Other Ambulatory Visit: Payer: Self-pay | Admitting: Family Medicine

## 2013-09-09 ENCOUNTER — Ambulatory Visit: Payer: Medicare Other | Admitting: Family Medicine

## 2013-09-11 ENCOUNTER — Ambulatory Visit: Payer: Medicare Other

## 2013-09-11 ENCOUNTER — Encounter: Payer: Self-pay | Admitting: Family Medicine

## 2013-09-11 ENCOUNTER — Ambulatory Visit (INDEPENDENT_AMBULATORY_CARE_PROVIDER_SITE_OTHER): Payer: Medicare Other | Admitting: Family Medicine

## 2013-09-11 VITALS — BP 146/77 | HR 83 | Temp 98.5°F | Ht 62.5 in | Wt 121.0 lb

## 2013-09-11 DIAGNOSIS — C50919 Malignant neoplasm of unspecified site of unspecified female breast: Secondary | ICD-10-CM

## 2013-09-11 DIAGNOSIS — R002 Palpitations: Secondary | ICD-10-CM

## 2013-09-11 DIAGNOSIS — M1990 Unspecified osteoarthritis, unspecified site: Secondary | ICD-10-CM

## 2013-09-11 DIAGNOSIS — C50911 Malignant neoplasm of unspecified site of right female breast: Secondary | ICD-10-CM

## 2013-09-11 DIAGNOSIS — E039 Hypothyroidism, unspecified: Secondary | ICD-10-CM

## 2013-09-11 DIAGNOSIS — I119 Hypertensive heart disease without heart failure: Secondary | ICD-10-CM

## 2013-09-11 DIAGNOSIS — R2689 Other abnormalities of gait and mobility: Secondary | ICD-10-CM

## 2013-09-11 DIAGNOSIS — E559 Vitamin D deficiency, unspecified: Secondary | ICD-10-CM

## 2013-09-11 DIAGNOSIS — M25569 Pain in unspecified knee: Secondary | ICD-10-CM

## 2013-09-11 DIAGNOSIS — E785 Hyperlipidemia, unspecified: Secondary | ICD-10-CM

## 2013-09-11 DIAGNOSIS — M129 Arthropathy, unspecified: Secondary | ICD-10-CM

## 2013-09-11 DIAGNOSIS — Z1231 Encounter for screening mammogram for malignant neoplasm of breast: Secondary | ICD-10-CM

## 2013-09-11 LAB — POCT CBC
GRANULOCYTE PERCENT: 63.2 % (ref 37–80)
HCT, POC: 44.4 % (ref 37.7–47.9)
Hemoglobin: 14 g/dL (ref 12.2–16.2)
Lymph, poc: 1.9 (ref 0.6–3.4)
MCH, POC: 29.7 pg (ref 27–31.2)
MCHC: 31.6 g/dL — AB (ref 31.8–35.4)
MCV: 93.9 fL (ref 80–97)
MPV: 9.6 fL (ref 0–99.8)
PLATELET COUNT, POC: 166 10*3/uL (ref 142–424)
POC GRANULOCYTE: 4.1 (ref 2–6.9)
POC LYMPH %: 29.2 % (ref 10–50)
RBC: 4.7 M/uL (ref 4.04–5.48)
RDW, POC: 14.5 %
WBC: 6.5 10*3/uL (ref 4.6–10.2)

## 2013-09-11 MED ORDER — DENOSUMAB 60 MG/ML ~~LOC~~ SOLN
60.0000 mg | Freq: Once | SUBCUTANEOUS | Status: AC
  Administered 2013-09-11: 60 mg via SUBCUTANEOUS

## 2013-09-11 NOTE — Patient Instructions (Addendum)
Continue current medications. Continue good therapeutic lifestyle changes which include good diet and exercise. Fall precautions discussed with patient. Schedule your flu vaccine if you haven't had it yet If you are over 78 years old - you may need Prevnar 65 or the adult Pneumonia vaccine. We will arrange for you to get physical therapy next door for gait strengthening and improved mobility Use warm wet compresses to posterior neck and take Tylenol or ASA as needed Get mammogram as planned and followup with surgeon as planned Do not forget to check out the life line monitor

## 2013-09-11 NOTE — Progress Notes (Signed)
Subjective:    Patient ID: Janice Reed, female    DOB: June 18, 1922, 80 y.o.   MRN: [id number]  HPI Pt here for follow up and management of chronic medical problems. Patient complains of arthralgias and left leg with occasional paresthesias        Patient Active Problem List   Diagnosis Date Noted  . Internal hemorrhoids without mention of complication 37/34/2876  . Immune fecal occult blood test + x 2 01/05/2012  . Cancer of right breast 09/28/2011  . Hearing loss 12/27/2010  . Arthritis 12/27/2010  . Hypertension   . Other and unspecified hyperlipidemia   . Phlebitis and thrombophlebitis of unspecified site   . Symptomatic menopausal or female climacteric states   . Malaise and fatigue   . Palpitations   . Nonspecific abnormal electrocardiogram (ECG) (EKG)   . Unspecified hypothyroidism   . Osteoporosis   . IBS (irritable bowel syndrome)    Outpatient Encounter Prescriptions as of 09/11/2013  Medication Sig  . Aflibercept (EYLEA IO) Inject into the eye every 30 (thirty) days.   [name] Kitchen aspirin 325 MG tablet Take 325 mg by mouth See admin instructions. Take one capsule a day and more if needed for pain - up to 4 times daily  . benazepril (LOTENSIN) 10 MG tablet Take 10 mg by mouth daily.    . bevacizumab (AVASTIN) 1.25 mg/0.1 mL SOLN 1.25 mg by Intravitreal route every 30 (thirty) days.   . Calcium-Magnesium-Vitamin D (CITRACAL CALCIUM+D PO) Take 1 capsule by mouth 2 (two) times daily.    . Cholecalciferol (VITAMIN D3) 1000 UNITS CAPS Take 1,000 Units by mouth 2 (two) times daily.   [name] Kitchen levothyroxine (SYNTHROID, LEVOTHROID) 100 MCG tablet Take 100 mcg by mouth daily.   . Multiple Vitamins-Minerals (ICAPS AREDS FORMULA PO) Take 1 capsule by mouth 2 (two) times daily.   . Omega-3 Fatty Acids (FISH OIL) 1000 MG CAPS Take 1,000 mg by mouth 2 (two) times daily.   [name] Kitchen PROLIA 60 MG/ML SOLN injection Inject subcutaneously 45m  every 6 months (to be  administered by MD at  office)  .  ranitidine (ZANTAC) 150 MG tablet Take 150 mg by mouth 2 (two) times daily as needed for heartburn.  . triamterene-hydrochlorothiazide (MAXZIDE-25) 37.5-25 MG per tablet Take 0.5 tablets by mouth daily.  .[name] Kitchenatorvastatin (LIPITOR) 20 MG tablet Take 20 mg by mouth every evening.     Review of Systems  Constitutional: Negative.   HENT: Negative.   Eyes: Negative.   Respiratory: Negative.   Cardiovascular: Negative.   Gastrointestinal: Negative.   Endocrine: Negative.   Genitourinary: Negative.   Musculoskeletal: Positive for arthralgias (pain in left leg- from vericose veins ( some swelling in left ankle)).       Left hand tingling, numb, and cold at times  Skin: Negative.   Allergic/Immunologic: Negative.   Neurological: Negative.   Hematological: Negative.   Psychiatric/Behavioral: Negative.        Objective:   Physical Exam  Nursing note and vitals reviewed. Constitutional: She is oriented to person, place, and time. No distress.  BN younger appearing than stated age, kyphotic and pleasant  HENT:  Head: Normocephalic and atraumatic.  Right Ear: External ear normal.  Left Ear: External ear normal.  Nose: Nose normal.  Mouth/Throat: Oropharynx is clear and moist.  Eyes: Conjunctivae and EOM are normal. Pupils are equal, round, and reactive to light. Right eye exhibits no discharge. Left eye exhibits no discharge. No scleral icterus.  Neck: Normal range  of motion. Neck supple. No JVD present. No thyromegaly present.  Cardiovascular: Normal rate, regular rhythm, normal heart sounds and intact distal pulses.  Exam reveals no gallop and no friction rub.   No murmur heard. At 72 per minute  Pulmonary/Chest: Effort normal and breath sounds normal. No respiratory distress. She has no wheezes. She has no rales. She exhibits no tenderness.  Abdominal: Soft. Bowel sounds are normal. She exhibits no mass. There is no tenderness. There is no rebound and no guarding.  Musculoskeletal:  Normal range of motion. She exhibits no edema and no tenderness.  Left foot and leg are slightly larger than the right and this is most likely secondary to the history of DVT many years ago. No occipital mass or significant tenderness  Lymphadenopathy:    She has no cervical adenopathy.  Neurological: She is alert and oriented to person, place, and time. She has normal reflexes.  Skin: Skin is warm and dry.  Psychiatric: She has a normal mood and affect. Her behavior is normal. Judgment and thought content normal.  Patient wants to maintain her dignity and independence and because of her kyphotic spine she feels less confident with her movements.   BP 146/77  Pulse 83  Temp(Src) 98.5 F (36.9 C) (Oral)  Ht 5' 2.5" (1.588 m)  Wt 121 lb (54.885 kg)  BMI 21.76 kg/m2  [facility] reading (PRIMARY) by  Dr Brunilda Payor x-ray-no active disease                                    Assessment & Plan:  1. Hypertension - POCT CBC - BMP8+EGFR - Hepatic function panel - DG Chest 2 View; Future  2. Other and unspecified hyperlipidemia - POCT CBC - Lipid panel  3. Unspecified hypothyroidism - POCT CBC  4. Vitamin D deficiency - Vit D  25 hydroxy (rtn osteoporosis monitoring) - denosumab (PROLIA) injection 60 mg; Inject 60 mg into the skin once.  5. Visit for screening mammogram - [facility] DIGITAL SCREENING BILATERAL; Future  6. Cancer of right breast  7. Arthritis - denosumab (PROLIA) injection 60 mg; Inject 60 mg into the skin once.  8. Palpitations   Patient Instructions  Continue current medications. Continue good therapeutic lifestyle changes which include good diet and exercise. Fall precautions discussed with patient. Schedule your flu vaccine if you haven't had it yet If you are over 86 years old - you may need Prevnar 32 or the adult Pneumonia vaccine. We will arrange for you to get physical therapy next door for gait strengthening and improved mobility Use warm wet compresses to  posterior neck and take Tylenol or ASA as needed Get mammogram as planned and followup with surgeon as planned Do not forget to check out the life line monitor   Arrie Senate MD

## 2013-09-12 LAB — HEPATIC FUNCTION PANEL
ALBUMIN: 4.2 g/dL (ref 3.5–4.7)
ALT: 19 IU/L (ref 0–32)
AST: 23 IU/L (ref 0–40)
Alkaline Phosphatase: 87 IU/L (ref 39–117)
BILIRUBIN TOTAL: 0.3 mg/dL (ref 0.0–1.2)
Bilirubin, Direct: 0.08 mg/dL (ref 0.00–0.40)
Total Protein: 6.6 g/dL (ref 6.0–8.5)

## 2013-09-12 LAB — BMP8+EGFR
BUN/Creatinine Ratio: 23 (ref 11–26)
BUN: 23 mg/dL (ref 8–27)
CALCIUM: 9.4 mg/dL (ref 8.7–10.3)
CHLORIDE: 97 mmol/L (ref 97–108)
CO2: 28 mmol/L (ref 18–29)
CREATININE: 1.02 mg/dL — AB (ref 0.57–1.00)
GFR calc Af Amer: 56 mL/min/{1.73_m2} — ABNORMAL LOW (ref >59)
GFR calc non Af Amer: 49 mL/min/{1.73_m2} — ABNORMAL LOW (ref >59)
Glucose: 110 mg/dL — ABNORMAL HIGH (ref 65–99)
Potassium: 4.3 mmol/L (ref 3.5–5.2)
Sodium: 138 mmol/L (ref 134–144)

## 2013-09-12 LAB — LIPID PANEL
CHOL/HDL RATIO: 3.2 ratio (ref 0.0–4.4)
Cholesterol, Total: 254 mg/dL — ABNORMAL HIGH (ref 100–199)
HDL: 80 mg/dL (ref >39)
LDL Calculated: 148 mg/dL — ABNORMAL HIGH (ref 0–99)
TRIGLYCERIDES: 130 mg/dL (ref 0–149)
VLDL CHOLESTEROL CAL: 26 mg/dL (ref 5–40)

## 2013-09-12 LAB — VITAMIN D 25 HYDROXY (VIT D DEFICIENCY, FRACTURES): VIT D 25 HYDROXY: 39.9 ng/mL (ref 30.0–100.0)

## 2013-09-15 NOTE — Addendum Note (Signed)
Addended by: Zannie Cove on: 09/15/2013 08:09 AM   Modules accepted: Orders

## 2013-09-17 ENCOUNTER — Other Ambulatory Visit: Payer: Self-pay | Admitting: Family Medicine

## 2013-09-19 ENCOUNTER — Telehealth: Payer: Self-pay | Admitting: Family Medicine

## 2013-09-19 NOTE — Telephone Encounter (Signed)
PATIENT AWARE

## 2013-09-22 ENCOUNTER — Ambulatory Visit: Payer: Medicare Other | Admitting: Physical Therapy

## 2013-09-25 ENCOUNTER — Ambulatory Visit (INDEPENDENT_AMBULATORY_CARE_PROVIDER_SITE_OTHER): Payer: Medicare Other | Admitting: General Surgery

## 2013-09-29 ENCOUNTER — Telehealth: Payer: Self-pay | Admitting: Family Medicine

## 2013-10-02 ENCOUNTER — Ambulatory Visit: Payer: Medicare Other | Admitting: Physical Therapy

## 2013-10-06 ENCOUNTER — Ambulatory Visit: Payer: Medicare Other | Attending: Family Medicine | Admitting: Physical Therapy

## 2013-10-06 DIAGNOSIS — IMO0001 Reserved for inherently not codable concepts without codable children: Secondary | ICD-10-CM | POA: Insufficient documentation

## 2013-10-06 DIAGNOSIS — R5381 Other malaise: Secondary | ICD-10-CM | POA: Insufficient documentation

## 2013-10-06 DIAGNOSIS — R42 Dizziness and giddiness: Secondary | ICD-10-CM | POA: Insufficient documentation

## 2013-10-08 ENCOUNTER — Ambulatory Visit: Payer: Medicare Other | Admitting: Physical Therapy

## 2013-10-13 ENCOUNTER — Ambulatory Visit: Payer: Medicare Other | Admitting: Physical Therapy

## 2013-10-15 ENCOUNTER — Ambulatory Visit: Payer: Medicare Other | Admitting: Physical Therapy

## 2013-10-17 ENCOUNTER — Ambulatory Visit: Payer: Medicare Other | Admitting: *Deleted

## 2013-10-20 ENCOUNTER — Telehealth: Payer: Self-pay | Admitting: Family Medicine

## 2013-10-20 DIAGNOSIS — Z853 Personal history of malignant neoplasm of breast: Secondary | ICD-10-CM

## 2013-10-21 NOTE — Telephone Encounter (Signed)
Needs referral to Dr. Dalbert Batman. Sees him once a year to follow up on breast surgery. Appt scheduled for 11/12/13.

## 2013-11-12 ENCOUNTER — Ambulatory Visit (INDEPENDENT_AMBULATORY_CARE_PROVIDER_SITE_OTHER): Payer: Medicare Other | Admitting: General Surgery

## 2013-11-12 ENCOUNTER — Encounter (INDEPENDENT_AMBULATORY_CARE_PROVIDER_SITE_OTHER): Payer: Self-pay | Admitting: General Surgery

## 2013-11-12 VITALS — BP 118/80 | HR 68 | Temp 97.4°F | Resp 16 | Ht 63.0 in | Wt 121.8 lb

## 2013-11-12 DIAGNOSIS — C50911 Malignant neoplasm of unspecified site of right female breast: Secondary | ICD-10-CM

## 2013-11-12 DIAGNOSIS — C50919 Malignant neoplasm of unspecified site of unspecified female breast: Secondary | ICD-10-CM

## 2013-11-12 NOTE — Patient Instructions (Signed)
Examination of the breasts and all of the regional lymph nodes today is normal.  Your recent mammograms at [facility] are also normal.  There is no evidence of cancer.  We agree that you would graduate from my care today, but you may return to see me if there are any new problems in the future.  Be sure to get annual mammograms  Be sure that Dr. Laurance Flatten examins your breasts once a year.

## 2013-11-12 NOTE — Progress Notes (Signed)
Patient ID: Janice Reed, female   DOB: 04/03/1922, 80 y.o.   MRN: [id number] History: This patient returns for long-term followup regarding her right breast cancer.  On 09/08/2008 she underwent right partial mastectomy and SLN biopsy for a cancer in the central right breast. Pathologic stage T2, N0, (IHC-positive). Receptor positive, HER-2-negative, 2.1 cm tumor.  She had adjuvant radiation therapy and Dr. Sonny Dandy decided that no further treatment was necessary, And she was discharged from his care. She has no complaints about her breast. She feels things are stable  Most recent mammograms at [place] [facility] dated 10/28/2013  looked fine. No focal abnormality. Category 2.   Exam: Patient looks well. No distress.  Neck reveals no adenopathy or mass or JVD.  Lungs: Clear to auscultation bilaterally  Heart: Regular rate and rhythm. No ectopy. No murmur  Breast: Somewhat atrophic. Soft. Transverse scar in upper outer right breast. No palpable mass either breast. No adenopathy   Past history, family history, and social history are documented on the chart, unchanged, and noncontributory except as described above. ROS: 10 system review of systems is negative except as described above   Assessment: Invasive carcinoma right breast, central, 2.1 cm tumor, receptor positive, HER-2-negative, stage T2, N0.(IHC+)  No evidence of recurrence 5 years following right partial mastectomy and sentinel node biopsy and adjuvant radiation therapy   Plan: She states that she is hoping she can graduate from my care this time, and I told her that was reasonable.  Repeat mammograms in 1 year. Certainly she can consider discontinuing mammograms at any time given her advanced age. She will continue to be followed by Dr. Morrie Sheldon. I told her to ask him to perform an annual breast exam very Return to see me as necessary .  Edsel Petrin. Dalbert Batman, M.D., [facility]  General and Minimally  invasive Surgery  Breast and Colorectal Surgery  Office: [phone]  Pager: [phone]

## 2013-11-19 ENCOUNTER — Encounter (INDEPENDENT_AMBULATORY_CARE_PROVIDER_SITE_OTHER): Payer: Self-pay

## 2013-12-05 ENCOUNTER — Other Ambulatory Visit: Payer: Self-pay | Admitting: Family Medicine

## 2013-12-09 ENCOUNTER — Ambulatory Visit: Payer: Medicare Other | Admitting: *Deleted

## 2013-12-09 ENCOUNTER — Ambulatory Visit (INDEPENDENT_AMBULATORY_CARE_PROVIDER_SITE_OTHER): Payer: Medicare Other | Admitting: Family Medicine

## 2013-12-09 ENCOUNTER — Encounter: Payer: Self-pay | Admitting: Family Medicine

## 2013-12-09 ENCOUNTER — Other Ambulatory Visit: Payer: Self-pay | Admitting: Family Medicine

## 2013-12-09 VITALS — BP 145/73 | HR 88 | Temp 98.2°F | Ht 63.0 in | Wt 119.0 lb

## 2013-12-09 DIAGNOSIS — A084 Viral intestinal infection, unspecified: Secondary | ICD-10-CM

## 2013-12-09 DIAGNOSIS — R197 Diarrhea, unspecified: Secondary | ICD-10-CM

## 2013-12-09 DIAGNOSIS — R300 Dysuria: Secondary | ICD-10-CM

## 2013-12-09 DIAGNOSIS — A088 Other specified intestinal infections: Secondary | ICD-10-CM

## 2013-12-09 DIAGNOSIS — N390 Urinary tract infection, site not specified: Secondary | ICD-10-CM

## 2013-12-09 LAB — POCT CBC
Granulocyte percent: 70.7 %G (ref 37–80)
HEMATOCRIT: 40.4 % (ref 37.7–47.9)
HEMOGLOBIN: 13.1 g/dL (ref 12.2–16.2)
Lymph, poc: 1.7 (ref 0.6–3.4)
MCH, POC: 30.3 pg (ref 27–31.2)
MCHC: 32.4 g/dL (ref 31.8–35.4)
MCV: 93.5 fL (ref 80–97)
MPV: 9.7 fL (ref 0–99.8)
POC GRANULOCYTE: 4.7 (ref 2–6.9)
POC LYMPH %: 26.1 % (ref 10–50)
Platelet Count, POC: 191 10*3/uL (ref 142–424)
RBC: 4.3 M/uL (ref 4.04–5.48)
RDW, POC: 13.1 %
WBC: 6.6 10*3/uL (ref 4.6–10.2)

## 2013-12-09 LAB — POCT UA - MICROSCOPIC ONLY
CASTS, UR, LPF, POC: NEGATIVE
Crystals, Ur, HPF, POC: NEGATIVE
Mucus, UA: NEGATIVE
YEAST UA: NEGATIVE

## 2013-12-09 LAB — POCT URINALYSIS DIPSTICK
BILIRUBIN UA: NEGATIVE
GLUCOSE UA: NEGATIVE
Ketones, UA: NEGATIVE
NITRITE UA: POSITIVE
Protein, UA: NEGATIVE
Spec Grav, UA: <=1.005
UROBILINOGEN UA: NEGATIVE
pH, UA: 7

## 2013-12-09 MED ORDER — CIPROFLOXACIN HCL 250 MG PO TABS: 250.0000 mg | ORAL_TABLET | Freq: Two times a day (BID) | ORAL | Status: DC

## 2013-12-09 NOTE — Progress Notes (Signed)
Subjective:    Patient ID: Janice Reed, female    DOB: 1922-04-12, 80 y.o.   MRN: [id number]  HPI Patient here today for diarrhea that has been going on for 2 weeks. The patient indicates that she has only been taking Pepto-Bismol for this. She denies fever or being around anyone else has been sick. She denies milk cheese ice cream and dairy products. She denies caffeine. The patient also complains of some difficulty or burning when she passes her water. Alert and uses a can because of her severe kyphosis scoliosis. She indicates she's been trying to drink plenty of fluids.        Patient Active Problem List   Diagnosis Date Noted  . Internal hemorrhoids without mention of complication 22/29/7989  . Immune fecal occult blood test + x 2 01/05/2012  . Cancer of right breast 09/28/2011  . Hearing loss 12/27/2010  . Arthritis 12/27/2010  . Hypertension   . Hyperlipidemia   . Phlebitis and thrombophlebitis of unspecified site   . Symptomatic menopausal or female climacteric states   . Malaise and fatigue   . Palpitations   . Nonspecific abnormal electrocardiogram (ECG) (EKG)   . Unspecified hypothyroidism   . Osteoporosis   . IBS (irritable bowel syndrome)    Outpatient Encounter Prescriptions as of 12/09/2013  Medication Sig  . Aflibercept (EYLEA IO) Inject into the eye every 30 (thirty) days.   [name] Kitchen aspirin 325 MG tablet Take 325 mg by mouth See admin instructions. Take one capsule a day and more if needed for pain - up to 4 times daily  . atorvastatin (LIPITOR) 20 MG tablet Take 20 mg by mouth every evening.   . benazepril (LOTENSIN) 10 MG tablet Take 10 mg by mouth daily.    . bevacizumab (AVASTIN) 1.25 mg/0.1 mL SOLN 1.25 mg by Intravitreal route every 30 (thirty) days.   . Calcium-Magnesium-Vitamin D (CITRACAL CALCIUM+D PO) Take 1 capsule by mouth 2 (two) times daily.    . Cholecalciferol (VITAMIN D3) 1000 UNITS CAPS Take 1,000 Units by mouth 2 (two) times daily.   [name] Kitchen  levothyroxine (SYNTHROID, LEVOTHROID) 100 MCG tablet Take 1 tablet by mouth  every day  . Multiple Vitamins-Minerals (ICAPS AREDS FORMULA PO) Take 1 capsule by mouth 2 (two) times daily.   . Omega-3 Fatty Acids (FISH OIL) 1000 MG CAPS Take 1,000 mg by mouth 2 (two) times daily.   [name] Kitchen PROLIA 60 MG/ML SOLN injection Inject subcutaneously 53m  every 6 months (to be  administered by MD at  office)  . ranitidine (ZANTAC) 150 MG tablet Take 150 mg by mouth 2 (two) times daily as needed for heartburn.  . triamterene-hydrochlorothiazide (MAXZIDE-25) 37.5-25 MG per tablet Take 1 tablet by mouth  every day as directed    Review of Systems  Constitutional: Negative.   HENT: Negative.   Eyes: Negative.   Respiratory: Negative.   Cardiovascular: Negative.   Gastrointestinal: Positive for diarrhea.  Endocrine: Negative.   Genitourinary: Negative.   Musculoskeletal: Negative.   Skin: Negative.   Allergic/Immunologic: Negative.   Neurological: Negative.   Hematological: Negative.   Psychiatric/Behavioral: Negative.        Objective:   Physical Exam  Nursing note and vitals reviewed. Constitutional: She is oriented to person, place, and time. She appears well-developed and well-nourished. No distress.  Pleasant and alert and cooperative with kyphoscoliosis and using a cane  HENT:  Head: Normocephalic and atraumatic.  Right Ear: External ear normal.  Left Ear: External ear  normal.  Nose: Nose normal.  Mouth/Throat: Oropharynx is clear and moist.  Appears well-hydrated  Eyes: Conjunctivae and EOM are normal. Pupils are equal, round, and reactive to light. Right eye exhibits no discharge. Left eye exhibits no discharge. No scleral icterus.  Neck: Normal range of motion. Neck supple. No thyromegaly present.  Cardiovascular: Normal rate, regular rhythm, normal heart sounds and intact distal pulses.  Exam reveals no gallop and no friction rub.   No murmur heard. At 84 per minute  Pulmonary/Chest:  Effort normal and breath sounds normal. No respiratory distress. She has no wheezes. She has no rales. She exhibits no tenderness.  Abdominal: Soft. Bowel sounds are normal. She exhibits no mass. There is no tenderness. There is no rebound and no guarding.  No abdominal tenderness or masses  Musculoskeletal: Normal range of motion. She exhibits no edema and no tenderness.  Lymphadenopathy:    She has no cervical adenopathy.  Neurological: She is alert and oriented to person, place, and time. She has normal reflexes. No cranial nerve deficit.  Skin: Skin is warm and dry. Rash noted.  Psychiatric: She has a normal mood and affect. Her behavior is normal. Judgment and thought content normal.   BP 145/73  Pulse 88  Temp(Src) 98.2 F (36.8 C) (Oral)  Ht 5' 3" (1.6 m)  Wt 119 lb (53.978 kg)  BMI 21.09 kg/m2        Assessment & Plan:  1. Diarrhea - POCT CBC - BMP8+EGFR - Thyroid Panel With TSH  2. Dysuria - POCT UA - Microscopic Only - POCT urinalysis dipstick - Urine culture  3. Viral gastroenteritis Patient Instructions  Clear liquids for 24 hours (like 7-Up, ginger ale, Sprite, Jello, frozen pops) Full liquids the second 24-hours (like potato soup, tomato soup, chicken noodle soup) Bland diet the third 24-hours (boiled and baked foods, no fried or greasy foods) Avoid milk, cheese, ice cream and dairy products for 72 hours. Avoid caffeine (cola drinks, coffee, tea, Mountain Dew, Mellow Yellow) Take in small amounts, but frequently. Tylenol and/or Advil as needed for aches pains and fever  Will call you with the results of the lab work once those results are available Take Imodium one twice daily for 2 days then one daily as needed Return the stool specimens for cultures as directed     Return the FOBT  Arrie Senate MD

## 2013-12-09 NOTE — Patient Instructions (Signed)
Clear liquids for 24 hours (like 7-Up, ginger ale, Sprite, Jello, frozen pops) Full liquids the second 24-hours (like potato soup, tomato soup, chicken noodle soup) Bland diet the third 24-hours (boiled and baked foods, no fried or greasy foods) Avoid milk, cheese, ice cream and dairy products for 72 hours. Avoid caffeine (cola drinks, coffee, tea, Mountain Dew, Mellow Yellow) Take in small amounts, but frequently. Tylenol and/or Advil as needed for aches pains and fever  Will call you with the results of the lab work once those results are available Take Imodium one twice daily for 2 days then one daily as needed Return the stool specimens for cultures as directed

## 2013-12-10 ENCOUNTER — Other Ambulatory Visit: Payer: Medicare Other

## 2013-12-10 DIAGNOSIS — Z1212 Encounter for screening for malignant neoplasm of rectum: Secondary | ICD-10-CM

## 2013-12-10 LAB — BMP8+EGFR
BUN/Creatinine Ratio: 16 (ref 11–26)
BUN: 19 mg/dL (ref 8–27)
CALCIUM: 9.1 mg/dL (ref 8.7–10.3)
CO2: 23 mmol/L (ref 18–29)
CREATININE: 1.17 mg/dL — AB (ref 0.57–1.00)
Chloride: 96 mmol/L — ABNORMAL LOW (ref 97–108)
GFR, EST AFRICAN AMERICAN: 48 mL/min/{1.73_m2} — AB (ref >59)
GFR, EST NON AFRICAN AMERICAN: 41 mL/min/{1.73_m2} — AB (ref >59)
GLUCOSE: 100 mg/dL — AB (ref 65–99)
Potassium: 4.4 mmol/L (ref 3.5–5.2)
Sodium: 136 mmol/L (ref 134–144)

## 2013-12-10 LAB — THYROID PANEL WITH TSH
FREE THYROXINE INDEX: 3.5 (ref 1.2–4.9)
T3 UPTAKE RATIO: 29 % (ref 24–39)
T4 TOTAL: 12 ug/dL (ref 4.5–12.0)
TSH: 1.53 u[IU]/mL (ref 0.450–4.500)

## 2013-12-11 LAB — URINE CULTURE

## 2013-12-12 LAB — FECAL OCCULT BLOOD, IMMUNOCHEMICAL: Fecal Occult Bld: NEGATIVE

## 2013-12-17 ENCOUNTER — Telehealth: Payer: Self-pay | Admitting: Family Medicine

## 2013-12-17 NOTE — Telephone Encounter (Signed)
Patient aware.

## 2013-12-19 ENCOUNTER — Other Ambulatory Visit (INDEPENDENT_AMBULATORY_CARE_PROVIDER_SITE_OTHER): Payer: Medicare Other

## 2013-12-19 DIAGNOSIS — N390 Urinary tract infection, site not specified: Secondary | ICD-10-CM

## 2013-12-19 LAB — POCT UA - MICROSCOPIC ONLY
CRYSTALS, UR, HPF, POC: NEGATIVE
Casts, Ur, LPF, POC: NEGATIVE
MUCUS UA: NEGATIVE
YEAST UA: NEGATIVE

## 2013-12-19 LAB — POCT URINALYSIS DIPSTICK
BILIRUBIN UA: NEGATIVE
Glucose, UA: NEGATIVE
KETONES UA: NEGATIVE
NITRITE UA: NEGATIVE
PH UA: 6
PROTEIN UA: NEGATIVE
Spec Grav, UA: 1.01
Urobilinogen, UA: NEGATIVE

## 2014-01-03 ENCOUNTER — Other Ambulatory Visit: Payer: Self-pay | Admitting: Family Medicine

## 2014-01-13 ENCOUNTER — Encounter: Payer: Self-pay | Admitting: Family Medicine

## 2014-01-13 ENCOUNTER — Ambulatory Visit (INDEPENDENT_AMBULATORY_CARE_PROVIDER_SITE_OTHER): Payer: Medicare Other

## 2014-01-13 ENCOUNTER — Ambulatory Visit (INDEPENDENT_AMBULATORY_CARE_PROVIDER_SITE_OTHER): Payer: Medicare Other | Admitting: Family Medicine

## 2014-01-13 VITALS — BP 143/69 | HR 88 | Temp 96.6°F | Ht 63.0 in | Wt 115.0 lb

## 2014-01-13 DIAGNOSIS — E559 Vitamin D deficiency, unspecified: Secondary | ICD-10-CM

## 2014-01-13 DIAGNOSIS — R634 Abnormal weight loss: Secondary | ICD-10-CM

## 2014-01-13 DIAGNOSIS — R799 Abnormal finding of blood chemistry, unspecified: Secondary | ICD-10-CM

## 2014-01-13 DIAGNOSIS — E785 Hyperlipidemia, unspecified: Secondary | ICD-10-CM

## 2014-01-13 DIAGNOSIS — I119 Hypertensive heart disease without heart failure: Secondary | ICD-10-CM

## 2014-01-13 DIAGNOSIS — E039 Hypothyroidism, unspecified: Secondary | ICD-10-CM

## 2014-01-13 DIAGNOSIS — Z853 Personal history of malignant neoplasm of breast: Secondary | ICD-10-CM

## 2014-01-13 DIAGNOSIS — R7989 Other specified abnormal findings of blood chemistry: Secondary | ICD-10-CM

## 2014-01-13 LAB — POCT CBC
Granulocyte percent: 66.7 %G (ref 37–80)
HCT, POC: 43 % (ref 37.7–47.9)
HEMOGLOBIN: 13.8 g/dL (ref 12.2–16.2)
Lymph, poc: 1.6 (ref 0.6–3.4)
MCH, POC: 29.9 pg (ref 27–31.2)
MCHC: 32 g/dL (ref 31.8–35.4)
MCV: 93.2 fL (ref 80–97)
MPV: 9.9 fL (ref 0–99.8)
PLATELET COUNT, POC: 212 10*3/uL (ref 142–424)
POC GRANULOCYTE: 4.1 (ref 2–6.9)
POC LYMPH PERCENT: 25.4 %L (ref 10–50)
RBC: 4.6 M/uL (ref 4.04–5.48)
RDW, POC: 13.5 %
WBC: 6.2 10*3/uL (ref 4.6–10.2)

## 2014-01-13 NOTE — Progress Notes (Signed)
Subjective:    Patient ID: Janice Reed, female    DOB: [birth year], 80 y.o.   MRN: [id number]  HPI Pt here for follow up and management of chronic medical problems. The patient has had a 4 pound weight loss over the past month. The patient is also getting over a bout with diarrhea. She also had family members from out of town visiting her for couple weeks and she thinks that stress is playing a role with her weight loss.        Patient Active Problem List   Diagnosis Date Noted  . Internal hemorrhoids without mention of complication 42/59/5638  . Immune fecal occult blood test + x 2 01/05/2012  . Cancer of right breast 09/28/2011  . Hearing loss 12/27/2010  . Arthritis 12/27/2010  . Hypertension   . Hyperlipidemia   . Phlebitis and thrombophlebitis of unspecified site   . Symptomatic menopausal or female climacteric states   . Malaise and fatigue   . Palpitations   . Nonspecific abnormal electrocardiogram (ECG) (EKG)   . Unspecified hypothyroidism   . Osteoporosis   . IBS (irritable bowel syndrome)    Outpatient Encounter Prescriptions as of 01/13/2014  Medication Sig  . Aflibercept (EYLEA IO) Inject into the eye every 30 (thirty) days.   [name] Kitchen aspirin 325 MG tablet Take 325 mg by mouth See admin instructions. Take one capsule a day and more if needed for pain - up to 4 times daily  . benazepril (LOTENSIN) 10 MG tablet Take 1 tablet by mouth  every day  . Calcium-Magnesium-Vitamin D (CITRACAL CALCIUM+D PO) Take 1 capsule by mouth 2 (two) times daily.    . Cholecalciferol (VITAMIN D3) 1000 UNITS CAPS Take 1,000 Units by mouth 2 (two) times daily.   [name] Kitchen levothyroxine (SYNTHROID, LEVOTHROID) 100 MCG tablet Take 1 tablet by mouth  every day  . Multiple Vitamins-Minerals (ICAPS AREDS FORMULA PO) Take 1 capsule by mouth 2 (two) times daily.   . Omega-3 Fatty Acids (FISH OIL) 1000 MG CAPS Take 1,000 mg by mouth 2 (two) times daily.   [name] Kitchen PROLIA 60 MG/ML SOLN injection Inject subcutaneously  44m  every 6 months (to be  administered by MD at  office)  . triamterene-hydrochlorothiazide (MAXZIDE-25) 37.5-25 MG per tablet Take 1 tablet by mouth  every day as directed  . atorvastatin (LIPITOR) 20 MG tablet Take 20 mg by mouth every evening.   . bevacizumab (AVASTIN) 1.25 mg/0.1 mL SOLN 1.25 mg by Intravitreal route every 30 (thirty) days.   . ranitidine (ZANTAC) 150 MG tablet Take 150 mg by mouth 2 (two) times daily as needed for heartburn.  . [DISCONTINUED] ciprofloxacin (CIPRO) 250 MG tablet Take 1 tablet (250 mg total) by mouth 2 (two) times daily.    Review of Systems  Constitutional: Positive for unexpected weight change (wt loss ).  HENT: Negative.   Eyes: Negative.   Respiratory: Negative.   Cardiovascular: Negative.   Gastrointestinal: Negative.   Endocrine: Negative.   Genitourinary: Negative.   Musculoskeletal: Negative.   Skin: Negative.   Allergic/Immunologic: Negative.   Neurological: Negative.   Hematological: Negative.   Psychiatric/Behavioral: Negative.        Objective:   Physical Exam  Nursing note and vitals reviewed. Constitutional: She is oriented to person, place, and time.  Thin kyphotic and pleasant female who looks and acts younger than her stated age of 89 HENT:  Head: Normocephalic and atraumatic.  Right Ear: External ear normal.  Left Ear: External  ear normal.  Nose: Nose normal.  Mouth/Throat: Oropharynx is clear and moist.  Hearing aid in the right ear canal  Eyes: Conjunctivae and EOM are normal. Pupils are equal, round, and reactive to light. Right eye exhibits no discharge. Left eye exhibits no discharge. No scleral icterus.  Neck: Normal range of motion. Neck supple. No thyromegaly present.  Cardiovascular: Normal rate, regular rhythm, normal heart sounds and intact distal pulses.  Exam reveals no gallop and no friction rub.   No murmur heard. At 72 per minute  Pulmonary/Chest: Effort normal and breath sounds normal. No  respiratory distress. She has no wheezes. She has no rales. She exhibits no tenderness.  Abdominal: Soft. Bowel sounds are normal. She exhibits no mass. There is no tenderness. There is no rebound and no guarding.  Musculoskeletal: She exhibits edema. She exhibits no tenderness.  Somewhat hesitant range of motion secondary to her kyphotic posture, slight edema in the left foot greater than the right  Lymphadenopathy:    She has no cervical adenopathy.  Neurological: She is alert and oriented to person, place, and time. She has normal reflexes. No cranial nerve deficit.  Skin: Skin is warm and dry. No rash noted.  Psychiatric: She has a normal mood and affect. Her behavior is normal. Judgment and thought content normal.   BP 143/69  Pulse 88  Temp(Src) 96.6 F (35.9 C) (Oral)  Ht '5\' 3"'  (1.6 m)  Wt 115 lb (52.164 kg)  BMI 20.38 kg/m2  [facility] reading (PRIMARY) by  Dr. Brunilda Payor x-ray-  no active disease, scoliosis, kyphosis, and osteoporosis                                      Assessment & Plan:  1. Hyperlipidemia - POCT CBC - Lipid panel  2. Hypertension - BMP8+EGFR - Hepatic function panel - POCT CBC - DG Chest 2 View; Future  3. Unspecified hypothyroidism - POCT CBC - Thyroid Panel With TSH  4. Vitamin D deficiency - POCT CBC - Vit D  25 hydroxy (rtn osteoporosis monitoring)  5. Loss of weight  6. Elevated serum creatinine  7. History of breast cancer -Recent mammogram was within normal  Patient Instructions                       Medicare Annual Wellness Visit  [facility] and the medical providers at [facility] strive to bring you the best medical care.  In doing so we not only want to address your current medical conditions and concerns but also to detect new conditions early and prevent illness, disease and health-related problems.    Medicare offers a yearly Wellness Visit which allows our clinical staff to assess your need for  preventative services including immunizations, lifestyle education, counseling to decrease risk of preventable diseases and screening for fall risk and other medical concerns.    This visit is provided free of charge (no copay) for all Medicare recipients. The clinical pharmacists at [facility] have begun to conduct these Wellness Visits which will also include a thorough review of all your medications.    As you primary medical provider recommend that you make an appointment for your Annual Wellness Visit if you have not done so already this year.  You may set up this appointment before you leave today or you may call back ([phone]) and schedule an  appointment.  Please make sure when you call that you mention that you are scheduling your Annual Wellness Visit with the clinical pharmacist so that the appointment may be made for the proper length of time.       Continue current medications. Continue good therapeutic lifestyle changes which include good diet and exercise. Fall precautions discussed with patient. If an FOBT was given today- please return it to our front desk. If you are over 4 years old - you may need Prevnar 36 or the adult Pneumonia vaccine.  Continue to watch caffeine intake Avoid milk cheese ice cream and dairy product Take Align over-the-counter one daily as a probiotic Return to clinic in 4 weeks for a BMP and weight check   Arrie Senate MD

## 2014-01-13 NOTE — Patient Instructions (Addendum)
Medicare Annual Wellness Visit  [facility] and the medical providers at [facility] strive to bring you the best medical care.  In doing so we not only want to address your current medical conditions and concerns but also to detect new conditions early and prevent illness, disease and health-related problems.    Medicare offers a yearly Wellness Visit which allows our clinical staff to assess your need for preventative services including immunizations, lifestyle education, counseling to decrease risk of preventable diseases and screening for fall risk and other medical concerns.    This visit is provided free of charge (no copay) for all Medicare recipients. The clinical pharmacists at [facility] have begun to conduct these Wellness Visits which will also include a thorough review of all your medications.    As you primary medical provider recommend that you make an appointment for your Annual Wellness Visit if you have not done so already this year.  You may set up this appointment before you leave today or you may call back ([phone]) and schedule an appointment.  Please make sure when you call that you mention that you are scheduling your Annual Wellness Visit with the clinical pharmacist so that the appointment may be made for the proper length of time.       Continue current medications. Continue good therapeutic lifestyle changes which include good diet and exercise. Fall precautions discussed with patient. If an FOBT was given today- please return it to our front desk. If you are over 10 years old - you may need Prevnar 62 or the adult Pneumonia vaccine.  Continue to watch caffeine intake Avoid milk cheese ice cream and dairy product Take Align over-the-counter one daily as a probiotic Return to clinic in 4 weeks for a BMP and weight check

## 2014-01-14 LAB — HEPATIC FUNCTION PANEL
ALBUMIN: 4.2 g/dL (ref 3.2–4.6)
ALK PHOS: 64 IU/L (ref 39–117)
ALT: 13 IU/L (ref 0–32)
AST: 20 IU/L (ref 0–40)
Bilirubin, Direct: 0.09 mg/dL (ref 0.00–0.40)
TOTAL PROTEIN: 6.3 g/dL (ref 6.0–8.5)
Total Bilirubin: 0.3 mg/dL (ref 0.0–1.2)

## 2014-01-14 LAB — BMP8+EGFR
BUN / CREAT RATIO: 28 — AB (ref 11–26)
BUN: 27 mg/dL (ref 10–36)
CO2: 28 mmol/L (ref 18–29)
CREATININE: 0.98 mg/dL (ref 0.57–1.00)
Calcium: 9.9 mg/dL (ref 8.7–10.3)
Chloride: 96 mmol/L — ABNORMAL LOW (ref 97–108)
GFR calc Af Amer: 59 mL/min/{1.73_m2} — ABNORMAL LOW (ref >59)
GFR, EST NON AFRICAN AMERICAN: 51 mL/min/{1.73_m2} — AB (ref >59)
Glucose: 81 mg/dL (ref 65–99)
Potassium: 4.1 mmol/L (ref 3.5–5.2)
SODIUM: 139 mmol/L (ref 134–144)

## 2014-01-14 LAB — THYROID PANEL WITH TSH
Free Thyroxine Index: 3 (ref 1.2–4.9)
T3 UPTAKE RATIO: 26 % (ref 24–39)
T4, Total: 11.7 ug/dL (ref 4.5–12.0)
TSH: 2.89 u[IU]/mL (ref 0.450–4.500)

## 2014-01-14 LAB — LIPID PANEL
Chol/HDL Ratio: 3.2 ratio units (ref 0.0–4.4)
Cholesterol, Total: 224 mg/dL — ABNORMAL HIGH (ref 100–199)
HDL: 70 mg/dL (ref >39)
LDL Calculated: 137 mg/dL — ABNORMAL HIGH (ref 0–99)
Triglycerides: 85 mg/dL (ref 0–149)
VLDL CHOLESTEROL CAL: 17 mg/dL (ref 5–40)

## 2014-01-14 LAB — VITAMIN D 25 HYDROXY (VIT D DEFICIENCY, FRACTURES): Vit D, 25-Hydroxy: 52.4 ng/mL (ref 30.0–100.0)

## 2014-02-10 ENCOUNTER — Ambulatory Visit (INDEPENDENT_AMBULATORY_CARE_PROVIDER_SITE_OTHER): Payer: Medicare Other | Admitting: *Deleted

## 2014-02-10 VITALS — BP 146/77 | HR 95 | Wt 115.6 lb

## 2014-02-10 DIAGNOSIS — R634 Abnormal weight loss: Secondary | ICD-10-CM

## 2014-02-10 DIAGNOSIS — I10 Essential (primary) hypertension: Secondary | ICD-10-CM

## 2014-02-10 NOTE — Progress Notes (Signed)
Patient came in today for a BP and weight check per Laurance Flatten. BP 146/77  Pulse 95  Wt 115 lb 9.6 oz (52.436 kg) Patient was told that I would send this over to Dr. Laurance Flatten to review and if any changes needed to be made we would call her. Patient also had a BMP today per moore.

## 2014-02-10 NOTE — Addendum Note (Signed)
Addended by: Selmer Dominion on: 02/10/2014 03:13 PM   Modules accepted: Orders

## 2014-02-11 LAB — BMP8+EGFR
BUN/Creatinine Ratio: 18 (ref 11–26)
BUN: 20 mg/dL (ref 10–36)
CALCIUM: 9.9 mg/dL (ref 8.7–10.3)
CHLORIDE: 94 mmol/L — AB (ref 97–108)
CO2: 25 mmol/L (ref 18–29)
Creatinine, Ser: 1.11 mg/dL — ABNORMAL HIGH (ref 0.57–1.00)
GFR calc Af Amer: 51 mL/min/{1.73_m2} — ABNORMAL LOW (ref >59)
GFR, EST NON AFRICAN AMERICAN: 44 mL/min/{1.73_m2} — AB (ref >59)
GLUCOSE: 111 mg/dL — AB (ref 65–99)
POTASSIUM: 4.1 mmol/L (ref 3.5–5.2)
Sodium: 138 mmol/L (ref 134–144)

## 2014-02-11 NOTE — Progress Notes (Signed)
Patient aware to come in 

## 2014-02-16 ENCOUNTER — Telehealth: Payer: Self-pay | Admitting: Pharmacist

## 2014-02-17 NOTE — Telephone Encounter (Signed)
We are working on PA - appt made for 03/12/14 for Prolia injection

## 2014-02-23 ENCOUNTER — Telehealth: Payer: Self-pay | Admitting: Pharmacist

## 2014-02-23 NOTE — Telephone Encounter (Signed)
I called patient to let her know that PA was sent last week and she informed me that she received a call from her insurance Friday 02/20/14 that PA was approved and they are shipping Prolia to her.  Appt 03/12/14.

## 2014-03-12 ENCOUNTER — Ambulatory Visit (INDEPENDENT_AMBULATORY_CARE_PROVIDER_SITE_OTHER): Payer: Medicare Other | Admitting: Pharmacist

## 2014-03-12 VITALS — BP 144/68 | HR 76 | Ht 63.0 in | Wt 115.0 lb

## 2014-03-12 DIAGNOSIS — H353230 Exudative age-related macular degeneration, bilateral, stage unspecified: Secondary | ICD-10-CM | POA: Insufficient documentation

## 2014-03-12 DIAGNOSIS — M810 Age-related osteoporosis without current pathological fracture: Secondary | ICD-10-CM

## 2014-03-12 DIAGNOSIS — H3530 Unspecified macular degeneration: Secondary | ICD-10-CM

## 2014-03-12 MED ORDER — DENOSUMAB 60 MG/ML ~~LOC~~ SOLN
60.0000 mg | Freq: Once | SUBCUTANEOUS | Status: AC
  Administered 2014-03-12: 60 mg via SUBCUTANEOUS

## 2014-03-12 NOTE — Progress Notes (Signed)
Patient ID: Janice Reed, female   DOB: 09/28/1921, 80 y.o.   MRN: [id number]  CC:  Osteoporosis  Patient here for her 3rd Prolia injection.  Last Prolia injection was 08/2013 She has osteoporosis.  Positive kyphosis. Last Dexa was 10/30/2012.  She has taken Actonel in the past but BMD continued to decrease.   She is getting about 1550mg  of calcium daily and last vitmamin D level was normal at 59 on 01/28/2013 Also last GFR was 53 (01/28/2013)  Assessment;  Osteoporosis  Plan:  Prolia 60mg  given SQ in upper left arm.  Continue calcium at least 1200mg  daily  Weight bearing exercise as able daily Recheck Dexa 10/2014 Next Prolia due 09/12/2014  Cherre Robins, PharmD, CPP

## 2014-03-12 NOTE — Patient Instructions (Signed)

## 2014-05-22 ENCOUNTER — Encounter: Payer: Self-pay | Admitting: Family Medicine

## 2014-05-22 ENCOUNTER — Ambulatory Visit (INDEPENDENT_AMBULATORY_CARE_PROVIDER_SITE_OTHER): Payer: Medicare Other | Admitting: Family Medicine

## 2014-05-22 VITALS — BP 157/91 | HR 81 | Temp 96.8°F | Ht 63.0 in | Wt 116.5 lb

## 2014-05-22 DIAGNOSIS — N183 Chronic kidney disease, stage 3 (moderate): Secondary | ICD-10-CM

## 2014-05-22 DIAGNOSIS — L209 Atopic dermatitis, unspecified: Secondary | ICD-10-CM

## 2014-05-22 DIAGNOSIS — C50911 Malignant neoplasm of unspecified site of right female breast: Secondary | ICD-10-CM

## 2014-05-22 DIAGNOSIS — I10 Essential (primary) hypertension: Secondary | ICD-10-CM

## 2014-05-22 DIAGNOSIS — E559 Vitamin D deficiency, unspecified: Secondary | ICD-10-CM

## 2014-05-22 DIAGNOSIS — E039 Hypothyroidism, unspecified: Secondary | ICD-10-CM

## 2014-05-22 DIAGNOSIS — R21 Rash and other nonspecific skin eruption: Secondary | ICD-10-CM

## 2014-05-22 DIAGNOSIS — Z23 Encounter for immunization: Secondary | ICD-10-CM

## 2014-05-22 DIAGNOSIS — E785 Hyperlipidemia, unspecified: Secondary | ICD-10-CM

## 2014-05-22 DIAGNOSIS — I129 Hypertensive chronic kidney disease with stage 1 through stage 4 chronic kidney disease, or unspecified chronic kidney disease: Secondary | ICD-10-CM | POA: Insufficient documentation

## 2014-05-22 DIAGNOSIS — H3530 Unspecified macular degeneration: Secondary | ICD-10-CM

## 2014-05-22 LAB — POCT CBC
Granulocyte percent: 65.9 %G (ref 37–80)
HEMATOCRIT: 45.1 % (ref 37.7–47.9)
Hemoglobin: 14.6 g/dL (ref 12.2–16.2)
Lymph, poc: 2.1 (ref 0.6–3.4)
MCH, POC: 30 pg (ref 27–31.2)
MCHC: 32.4 g/dL (ref 31.8–35.4)
MCV: 92.7 fL (ref 80–97)
MPV: 9.4 fL (ref 0–99.8)
POC GRANULOCYTE: 4.7 (ref 2–6.9)
POC LYMPH PERCENT: 28.9 %L (ref 10–50)
Platelet Count, POC: 175 10*3/uL (ref 142–424)
RBC: 4.9 M/uL (ref 4.04–5.48)
RDW, POC: 13.9 %
WBC: 7.2 10*3/uL (ref 4.6–10.2)

## 2014-05-22 MED ORDER — BENAZEPRIL HCL 10 MG PO TABS: ORAL_TABLET | ORAL | Status: DC

## 2014-05-22 MED ORDER — TRIAMTERENE-HCTZ 37.5-25 MG PO TABS: ORAL_TABLET | ORAL | Status: DC

## 2014-05-22 MED ORDER — LEVOTHYROXINE SODIUM 100 MCG PO TABS: ORAL_TABLET | ORAL | Status: DC

## 2014-05-22 NOTE — Patient Instructions (Addendum)
Medicare Annual Wellness Visit  [facility] and the medical providers at [facility] strive to bring you the best medical care.  In doing so we not only want to address your current medical conditions and concerns but also to detect new conditions early and prevent illness, disease and health-related problems.    Medicare offers a yearly Wellness Visit which allows our clinical staff to assess your need for preventative services including immunizations, lifestyle education, counseling to decrease risk of preventable diseases and screening for fall risk and other medical concerns.    This visit is provided free of charge (no copay) for all Medicare recipients. The clinical pharmacists at [facility] have begun to conduct these Wellness Visits which will also include a thorough review of all your medications.    As you primary medical provider recommend that you make an appointment for your Annual Wellness Visit if you have not done so already this year.  You may set up this appointment before you leave today or you may call back [phone]) and schedule an appointment.  Please make sure when you call that you mention that you are scheduling your Annual Wellness Visit with the clinical pharmacist so that the appointment may be made for the proper length of time.     Continue current medications. Continue good therapeutic lifestyle changes which include good diet and exercise. Fall precautions discussed with patient. If an FOBT was given today- please return it to our front desk. If you are over 42 years old - you may need Prevnar 63 or the adult Pneumonia vaccine.  Flu Shots will be available at our office starting mid- September. Please call and schedule a FLU CLINIC APPOINTMENT.   Avoid scent free fabric softener Use Benadryl for itching Use cortisone 10 cream sparingly at waistline Continue to drink plenty of fluid   No more  Prolia Monitor BP at home and bring readings by for review

## 2014-05-22 NOTE — Progress Notes (Signed)
Subjective:    Patient ID: Janice Reed, female    DOB: 07-06-1922, 80 y.o.   MRN: [id number]  HPI Pt here for follow up and management of chronic medical problems. The patient complains of fatigue. She also complains that she has a rash and she is concerned that it could be  from the prolia. She indicates that she does not want to take any more prolia.        Patient Active Problem List   Diagnosis Date Noted  . Benign essential HTN 05/22/2014  . Macular degeneration of both eyes 03/12/2014  . Internal hemorrhoids without mention of complication 40/98/1191  . Immune fecal occult blood test + x 2 01/05/2012  . Cancer of right breast 09/28/2011  . Hearing loss 12/27/2010  . Arthritis 12/27/2010  . Hypertension   . Hyperlipidemia   . Phlebitis and thrombophlebitis of unspecified site   . Symptomatic menopausal or female climacteric states   . Malaise and fatigue   . Palpitations   . Nonspecific abnormal electrocardiogram (ECG) (EKG)   . Hypothyroidism   . Osteoporosis   . IBS (irritable bowel syndrome)    Outpatient Encounter Prescriptions as of 05/22/2014  Medication Sig  . Aflibercept (EYLEA IO) Inject into the eye every 30 (thirty) days.   [name] Kitchen aspirin 325 MG tablet Take 325 mg by mouth See admin instructions. Take one capsule a day and more if needed for pain - up to 3 times daily  . benazepril (LOTENSIN) 10 MG tablet Take 1 tablet by mouth  every day  . bevacizumab (AVASTIN) 1.25 mg/0.1 mL SOLN 1.25 mg by Intravitreal route every 30 (thirty) days.   . Calcium-Magnesium-Vitamin D (CITRACAL CALCIUM+D PO) Take 1 capsule by mouth 2 (two) times daily.    . Cholecalciferol (VITAMIN D3) 1000 UNITS CAPS Take 1,000 Units by mouth 2 (two) times daily.   [name] Kitchen levothyroxine (SYNTHROID, LEVOTHROID) 100 MCG tablet Take 1 tablet by mouth  every day  . Multiple Vitamins-Minerals (ICAPS AREDS FORMULA PO) Take 1 capsule by mouth 2 (two) times daily.   . Omega-3 Fatty Acids (FISH OIL) 1000 MG  CAPS Take 1,000 mg by mouth 2 (two) times daily.   [name] Kitchen PROLIA 60 MG/ML SOLN injection Inject subcutaneously 54m  every 6 months (to be  administered by MD at  office)  . triamterene-hydrochlorothiazide (MAXZIDE-25) 37.5-25 MG per tablet Take 1 tablet by mouth  every day as directed  . ranitidine (ZANTAC) 150 MG tablet Take 150 mg by mouth 2 (two) times daily as needed for heartburn.    Review of Systems  Constitutional: Positive for fatigue ("no energy").  HENT: Negative.   Eyes: Negative.   Respiratory: Negative.   Cardiovascular: Negative.   Gastrointestinal: Negative.   Endocrine: Negative.   Genitourinary: Negative.   Musculoskeletal: Negative.   Skin: Negative.        Possible rash - pt feels it is from Prolia  Allergic/Immunologic: Negative.   Neurological: Negative.   Hematological: Negative.   Psychiatric/Behavioral: The patient is nervous/anxious.        Objective:   Physical Exam  Nursing note and vitals reviewed. Constitutional: She is oriented to person, place, and time. She appears well-developed and well-nourished. No distress.  The patient is pleasant, thin, and kyphotic. She is alert.  HENT:  Head: Normocephalic and atraumatic.  Right Ear: External ear normal.  Left Ear: External ear normal.  Mouth/Throat: Oropharynx is clear and moist.  There is some nasal congestion bilaterally  Eyes: Conjunctivae  and EOM are normal. Pupils are equal, round, and reactive to light. Right eye exhibits no discharge. Left eye exhibits no discharge. No scleral icterus.  Neck: Normal range of motion. Neck supple. No JVD present. No thyromegaly present.  Cardiovascular: Normal rate, regular rhythm and normal heart sounds.  Exam reveals no gallop and no friction rub.   No murmur heard. The pedal pulses were diminished. The heart is slightly irregular at 84 per minute  Pulmonary/Chest: Effort normal and breath sounds normal. No respiratory distress. She has no wheezes. She has no  rales. She exhibits no tenderness.  Abdominal: Soft. Bowel sounds are normal. She exhibits no mass. There is no tenderness. There is no rebound and no guarding.  Genitourinary:  Both breasts were checked. There was no discrete lumps or masses. There was definite thickening in the upper outer quadrant of the right breast. The patient has a history of radiation treatment for breast cancer.  Musculoskeletal: Normal range of motion. She exhibits edema. She exhibits no tenderness.  There is edema of the left lower extremity and this is specifically the leg that she had phlebitis in the past.  Lymphadenopathy:    She has no cervical adenopathy.  Neurological: She is alert and oriented to person, place, and time. She has normal reflexes.  Skin: Skin is warm and dry. Rash noted. No erythema. No pallor.  There is a faint dry pruritic rash on both arms and lower extremities.  Psychiatric: She has a normal mood and affect. Her behavior is normal. Judgment and thought content normal.   BP 172/91  Pulse 81  Temp(Src) 96.8 F (36 C) (Oral)  Ht '5\' 3"'  (1.6 m)  Wt 116 lb 8 oz (52.844 kg)  BMI 20.64 kg/m2 Repeat blood pressure 164/90 in the right arm sitting       Assessment & Plan:  1. Hyperlipidemia - POCT CBC - NMR, lipoprofile  2. Benign essential HTN - POCT CBC - BMP8+EGFR - Hepatic function panel  3. Hypothyroidism, unspecified hypothyroidism type - POCT CBC  4. Macular degeneration of both eyes - POCT CBC  5. Cancer of right breast - POCT CBC  6. Vitamin D deficiency - Vit D  25 hydroxy (rtn osteoporosis monitoring)  7. Atopic dermatitis  8. Rash and nonspecific skin eruption  Meds ordered this encounter  Medications  . triamterene-hydrochlorothiazide (MAXZIDE-25) 37.5-25 MG per tablet    Sig: Take 1 tablet by mouth  every day as directed    Dispense:  90 tablet    Refill:  3  . levothyroxine (SYNTHROID, LEVOTHROID) 100 MCG tablet    Sig: Take 1 tablet by mouth  every  day    Dispense:  90 tablet    Refill:  3  . benazepril (LOTENSIN) 10 MG tablet    Sig: Take 1 tablet by mouth  every day    Dispense:  90 tablet    Refill:  3   Patient Instructions                       Medicare Annual Wellness Visit  [facility] and the medical providers at [facility] strive to bring you the best medical care.  In doing so we not only want to address your current medical conditions and concerns but also to detect new conditions early and prevent illness, disease and health-related problems.    Medicare offers a yearly Wellness Visit which allows our clinical staff to assess your need for  preventative services including immunizations, lifestyle education, counseling to decrease risk of preventable diseases and screening for fall risk and other medical concerns.    This visit is provided free of charge (no copay) for all Medicare recipients. The clinical pharmacists at [facility] have begun to conduct these Wellness Visits which will also include a thorough review of all your medications.    As you primary medical provider recommend that you make an appointment for your Annual Wellness Visit if you have not done so already this year.  You may set up this appointment before you leave today or you may call back ([phone]) and schedule an appointment.  Please make sure when you call that you mention that you are scheduling your Annual Wellness Visit with the clinical pharmacist so that the appointment may be made for the proper length of time.     Continue current medications. Continue good therapeutic lifestyle changes which include good diet and exercise. Fall precautions discussed with patient. If an FOBT was given today- please return it to our front desk. If you are over 35 years old - you may need Prevnar 10 or the adult Pneumonia vaccine.  Flu Shots will be available at our office starting mid- September. Please call and  schedule a FLU CLINIC APPOINTMENT.   Avoid scent free fabric softener Use Benadryl for itching Use cortisone 10 cream sparingly at waistline Continue to drink plenty of fluid   Arrie Senate MD

## 2014-05-23 LAB — HEPATIC FUNCTION PANEL
ALT: 12 IU/L (ref 0–32)
AST: 20 IU/L (ref 0–40)
Albumin: 4.4 g/dL (ref 3.2–4.6)
Alkaline Phosphatase: 73 IU/L (ref 39–117)
BILIRUBIN DIRECT: 0.1 mg/dL (ref 0.00–0.40)
Total Bilirubin: 0.4 mg/dL (ref 0.0–1.2)
Total Protein: 6.8 g/dL (ref 6.0–8.5)

## 2014-05-23 LAB — BMP8+EGFR
BUN/Creatinine Ratio: 25 (ref 11–26)
BUN: 26 mg/dL (ref 10–36)
CO2: 28 mmol/L (ref 18–29)
Calcium: 10.1 mg/dL (ref 8.7–10.3)
Chloride: 95 mmol/L — ABNORMAL LOW (ref 97–108)
Creatinine, Ser: 1.05 mg/dL — ABNORMAL HIGH (ref 0.57–1.00)
GFR, EST AFRICAN AMERICAN: 54 mL/min/{1.73_m2} — AB (ref >59)
GFR, EST NON AFRICAN AMERICAN: 47 mL/min/{1.73_m2} — AB (ref >59)
Glucose: 93 mg/dL (ref 65–99)
POTASSIUM: 4.7 mmol/L (ref 3.5–5.2)
SODIUM: 137 mmol/L (ref 134–144)

## 2014-05-23 LAB — NMR, LIPOPROFILE
Cholesterol: 258 mg/dL — ABNORMAL HIGH (ref 100–199)
HDL Cholesterol by NMR: 79 mg/dL (ref >39)
HDL Particle Number: 36.4 umol/L (ref >=30.5)
LDL PARTICLE NUMBER: 1655 nmol/L — AB (ref <1000)
LDL SIZE: 21.5 nm (ref >20.5)
LDLC SERPL CALC-MCNC: 155 mg/dL — ABNORMAL HIGH (ref 0–99)
LP-IR Score: <25 (ref <=45)
Small LDL Particle Number: <90 nmol/L (ref <=527)
Triglycerides by NMR: 119 mg/dL (ref 0–149)

## 2014-05-23 LAB — VITAMIN D 25 HYDROXY (VIT D DEFICIENCY, FRACTURES): VIT D 25 HYDROXY: 48.6 ng/mL (ref 30.0–100.0)

## 2014-05-29 ENCOUNTER — Other Ambulatory Visit: Payer: Self-pay | Admitting: *Deleted

## 2014-05-29 DIAGNOSIS — R300 Dysuria: Secondary | ICD-10-CM

## 2014-06-02 ENCOUNTER — Other Ambulatory Visit: Payer: Medicare Other

## 2014-06-03 ENCOUNTER — Telehealth: Payer: Self-pay | Admitting: Family Medicine

## 2014-06-03 NOTE — Telephone Encounter (Signed)
Patient Aware that it was sent off to be cultured. That we would call her when it came back in

## 2014-06-04 ENCOUNTER — Telehealth: Payer: Self-pay | Admitting: *Deleted

## 2014-06-04 LAB — URINE CULTURE

## 2014-06-04 MED ORDER — CIPROFLOXACIN HCL 250 MG PO TABS: 250.0000 mg | ORAL_TABLET | Freq: Two times a day (BID) | ORAL | Status: DC

## 2014-06-04 NOTE — Telephone Encounter (Signed)
Message copied by Marin Olp on Thu Jun 04, 2014  3:02 PM ------      Message from: Chipper Herb      Created: Thu Jun 04, 2014  2:02 PM       Patient does have a significant urinary tract infection based on the culture and sensitivity. She'll need to be started on antibiotics. The bacteria that are growing or sensitive to Cipro. Please call in a prescription for Cipro 250 twice daily for 7 days. The patient should have a urine rechecked a few days after completing the Cipro. ------

## 2014-06-04 NOTE — Telephone Encounter (Signed)
Pt's daughter notified of results Verbalizes understanding 

## 2014-06-09 ENCOUNTER — Other Ambulatory Visit: Payer: Self-pay | Admitting: Family Medicine

## 2014-06-09 NOTE — Telephone Encounter (Signed)
SHORT TERM LEVOTHYROXINE CALLED INTO [facility] UNTIL HERS GETS TO HER THROUGH MAIL ORDER

## 2014-06-16 ENCOUNTER — Other Ambulatory Visit: Payer: Medicare Other

## 2014-06-16 NOTE — Addendum Note (Signed)
Addended by: Selmer Dominion on: 06/16/2014 04:59 PM   Modules accepted: Orders

## 2014-06-18 LAB — URINE CULTURE

## 2014-07-06 ENCOUNTER — Other Ambulatory Visit: Payer: Self-pay | Admitting: Family Medicine

## 2014-08-11 DIAGNOSIS — H3532 Exudative age-related macular degeneration: Secondary | ICD-10-CM | POA: Diagnosis not present

## 2014-08-17 DIAGNOSIS — M9903 Segmental and somatic dysfunction of lumbar region: Secondary | ICD-10-CM | POA: Diagnosis not present

## 2014-08-17 DIAGNOSIS — M9901 Segmental and somatic dysfunction of cervical region: Secondary | ICD-10-CM | POA: Diagnosis not present

## 2014-08-17 DIAGNOSIS — M9902 Segmental and somatic dysfunction of thoracic region: Secondary | ICD-10-CM | POA: Diagnosis not present

## 2014-08-17 DIAGNOSIS — M5137 Other intervertebral disc degeneration, lumbosacral region: Secondary | ICD-10-CM | POA: Diagnosis not present

## 2014-08-18 DIAGNOSIS — M9903 Segmental and somatic dysfunction of lumbar region: Secondary | ICD-10-CM | POA: Diagnosis not present

## 2014-08-18 DIAGNOSIS — M5137 Other intervertebral disc degeneration, lumbosacral region: Secondary | ICD-10-CM | POA: Diagnosis not present

## 2014-08-18 DIAGNOSIS — M9901 Segmental and somatic dysfunction of cervical region: Secondary | ICD-10-CM | POA: Diagnosis not present

## 2014-08-18 DIAGNOSIS — M9902 Segmental and somatic dysfunction of thoracic region: Secondary | ICD-10-CM | POA: Diagnosis not present

## 2014-08-19 DIAGNOSIS — M9903 Segmental and somatic dysfunction of lumbar region: Secondary | ICD-10-CM | POA: Diagnosis not present

## 2014-08-19 DIAGNOSIS — M5137 Other intervertebral disc degeneration, lumbosacral region: Secondary | ICD-10-CM | POA: Diagnosis not present

## 2014-08-19 DIAGNOSIS — M9901 Segmental and somatic dysfunction of cervical region: Secondary | ICD-10-CM | POA: Diagnosis not present

## 2014-08-19 DIAGNOSIS — M9902 Segmental and somatic dysfunction of thoracic region: Secondary | ICD-10-CM | POA: Diagnosis not present

## 2014-08-20 DIAGNOSIS — M9901 Segmental and somatic dysfunction of cervical region: Secondary | ICD-10-CM | POA: Diagnosis not present

## 2014-08-20 DIAGNOSIS — M9902 Segmental and somatic dysfunction of thoracic region: Secondary | ICD-10-CM | POA: Diagnosis not present

## 2014-08-20 DIAGNOSIS — M5137 Other intervertebral disc degeneration, lumbosacral region: Secondary | ICD-10-CM | POA: Diagnosis not present

## 2014-08-20 DIAGNOSIS — M9903 Segmental and somatic dysfunction of lumbar region: Secondary | ICD-10-CM | POA: Diagnosis not present

## 2014-08-24 DIAGNOSIS — M5137 Other intervertebral disc degeneration, lumbosacral region: Secondary | ICD-10-CM | POA: Diagnosis not present

## 2014-08-24 DIAGNOSIS — M9903 Segmental and somatic dysfunction of lumbar region: Secondary | ICD-10-CM | POA: Diagnosis not present

## 2014-08-24 DIAGNOSIS — M9901 Segmental and somatic dysfunction of cervical region: Secondary | ICD-10-CM | POA: Diagnosis not present

## 2014-08-24 DIAGNOSIS — M9902 Segmental and somatic dysfunction of thoracic region: Secondary | ICD-10-CM | POA: Diagnosis not present

## 2014-08-25 DIAGNOSIS — H3532 Exudative age-related macular degeneration: Secondary | ICD-10-CM | POA: Diagnosis not present

## 2014-08-26 DIAGNOSIS — M9903 Segmental and somatic dysfunction of lumbar region: Secondary | ICD-10-CM | POA: Diagnosis not present

## 2014-08-26 DIAGNOSIS — M5137 Other intervertebral disc degeneration, lumbosacral region: Secondary | ICD-10-CM | POA: Diagnosis not present

## 2014-08-26 DIAGNOSIS — M9902 Segmental and somatic dysfunction of thoracic region: Secondary | ICD-10-CM | POA: Diagnosis not present

## 2014-08-26 DIAGNOSIS — M9901 Segmental and somatic dysfunction of cervical region: Secondary | ICD-10-CM | POA: Diagnosis not present

## 2014-09-02 DIAGNOSIS — M9901 Segmental and somatic dysfunction of cervical region: Secondary | ICD-10-CM | POA: Diagnosis not present

## 2014-09-02 DIAGNOSIS — M9903 Segmental and somatic dysfunction of lumbar region: Secondary | ICD-10-CM | POA: Diagnosis not present

## 2014-09-02 DIAGNOSIS — M9902 Segmental and somatic dysfunction of thoracic region: Secondary | ICD-10-CM | POA: Diagnosis not present

## 2014-09-02 DIAGNOSIS — M5137 Other intervertebral disc degeneration, lumbosacral region: Secondary | ICD-10-CM | POA: Diagnosis not present

## 2014-09-03 DIAGNOSIS — M5137 Other intervertebral disc degeneration, lumbosacral region: Secondary | ICD-10-CM | POA: Diagnosis not present

## 2014-09-03 DIAGNOSIS — M9901 Segmental and somatic dysfunction of cervical region: Secondary | ICD-10-CM | POA: Diagnosis not present

## 2014-09-03 DIAGNOSIS — M9902 Segmental and somatic dysfunction of thoracic region: Secondary | ICD-10-CM | POA: Diagnosis not present

## 2014-09-03 DIAGNOSIS — M9903 Segmental and somatic dysfunction of lumbar region: Secondary | ICD-10-CM | POA: Diagnosis not present

## 2014-09-07 DIAGNOSIS — M5137 Other intervertebral disc degeneration, lumbosacral region: Secondary | ICD-10-CM | POA: Diagnosis not present

## 2014-09-07 DIAGNOSIS — M9901 Segmental and somatic dysfunction of cervical region: Secondary | ICD-10-CM | POA: Diagnosis not present

## 2014-09-07 DIAGNOSIS — M9902 Segmental and somatic dysfunction of thoracic region: Secondary | ICD-10-CM | POA: Diagnosis not present

## 2014-09-07 DIAGNOSIS — M9903 Segmental and somatic dysfunction of lumbar region: Secondary | ICD-10-CM | POA: Diagnosis not present

## 2014-09-08 ENCOUNTER — Other Ambulatory Visit: Payer: Self-pay | Admitting: Pharmacist

## 2014-09-10 DIAGNOSIS — M9902 Segmental and somatic dysfunction of thoracic region: Secondary | ICD-10-CM | POA: Diagnosis not present

## 2014-09-10 DIAGNOSIS — M9901 Segmental and somatic dysfunction of cervical region: Secondary | ICD-10-CM | POA: Diagnosis not present

## 2014-09-10 DIAGNOSIS — M9903 Segmental and somatic dysfunction of lumbar region: Secondary | ICD-10-CM | POA: Diagnosis not present

## 2014-09-10 DIAGNOSIS — M5137 Other intervertebral disc degeneration, lumbosacral region: Secondary | ICD-10-CM | POA: Diagnosis not present

## 2014-10-05 ENCOUNTER — Ambulatory Visit (INDEPENDENT_AMBULATORY_CARE_PROVIDER_SITE_OTHER): Payer: Medicare Other | Admitting: Family Medicine

## 2014-10-05 ENCOUNTER — Encounter: Payer: Self-pay | Admitting: Family Medicine

## 2014-10-05 VITALS — BP 139/78 | HR 79 | Temp 97.1°F | Ht 63.0 in | Wt 116.0 lb

## 2014-10-05 DIAGNOSIS — E559 Vitamin D deficiency, unspecified: Secondary | ICD-10-CM | POA: Diagnosis not present

## 2014-10-05 DIAGNOSIS — R21 Rash and other nonspecific skin eruption: Secondary | ICD-10-CM | POA: Diagnosis not present

## 2014-10-05 DIAGNOSIS — L853 Xerosis cutis: Secondary | ICD-10-CM | POA: Diagnosis not present

## 2014-10-05 DIAGNOSIS — R609 Edema, unspecified: Secondary | ICD-10-CM

## 2014-10-05 DIAGNOSIS — Z13820 Encounter for screening for osteoporosis: Secondary | ICD-10-CM

## 2014-10-05 DIAGNOSIS — E785 Hyperlipidemia, unspecified: Secondary | ICD-10-CM | POA: Diagnosis not present

## 2014-10-05 DIAGNOSIS — I10 Essential (primary) hypertension: Secondary | ICD-10-CM | POA: Diagnosis not present

## 2014-10-05 DIAGNOSIS — E039 Hypothyroidism, unspecified: Secondary | ICD-10-CM | POA: Diagnosis not present

## 2014-10-05 DIAGNOSIS — I872 Venous insufficiency (chronic) (peripheral): Secondary | ICD-10-CM | POA: Diagnosis not present

## 2014-10-05 DIAGNOSIS — Z780 Asymptomatic menopausal state: Secondary | ICD-10-CM

## 2014-10-05 DIAGNOSIS — C50911 Malignant neoplasm of unspecified site of right female breast: Secondary | ICD-10-CM

## 2014-10-05 LAB — POCT CBC
GRANULOCYTE PERCENT: 60.7 % (ref 37–80)
HCT, POC: 45.4 % (ref 37.7–47.9)
Hemoglobin: 14 g/dL (ref 12.2–16.2)
Lymph, poc: 2.3 (ref 0.6–3.4)
MCH, POC: 28.9 pg (ref 27–31.2)
MCHC: 30.8 g/dL — AB (ref 31.8–35.4)
MCV: 93.9 fL (ref 80–97)
MPV: 10.1 fL (ref 0–99.8)
POC Granulocyte: 4.4 (ref 2–6.9)
POC LYMPH PERCENT: 32.1 %L (ref 10–50)
Platelet Count, POC: 196 10*3/uL (ref 142–424)
RBC: 4.83 M/uL (ref 4.04–5.48)
RDW, POC: 14 %
WBC: 7.2 10*3/uL (ref 4.6–10.2)

## 2014-10-05 MED ORDER — METHYLPREDNISOLONE ACETATE 40 MG/ML IJ SUSP
40.0000 mg | Freq: Once | INTRAMUSCULAR | Status: AC
  Administered 2014-10-05: 40 mg via INTRAMUSCULAR

## 2014-10-05 NOTE — Patient Instructions (Addendum)
Medicare Annual Wellness Visit  [facility] and the medical providers at [facility] strive to bring you the best medical care.  In doing so we not only want to address your current medical conditions and concerns but also to detect new conditions early and prevent illness, disease and health-related problems.    Medicare offers a yearly Wellness Visit which allows our clinical staff to assess your need for preventative services including immunizations, lifestyle education, counseling to decrease risk of preventable diseases and screening for fall risk and other medical concerns.    This visit is provided free of charge (no copay) for all Medicare recipients. The clinical pharmacists at [facility] have begun to conduct these Wellness Visits which will also include a thorough review of all your medications.    As you primary medical provider recommend that you make an appointment for your Annual Wellness Visit if you have not done so already this year.  You may set up this appointment before you leave today or you may call back ([phone]) and schedule an appointment.  Please make sure when you call that you mention that you are scheduling your Annual Wellness Visit with the clinical pharmacist so that the appointment may be made for the proper length of time.     Continue current medications. Continue good therapeutic lifestyle changes which include good diet and exercise. Fall precautions discussed with patient. If an FOBT was given today- please return it to our front desk. If you are over 82 years old - you may need Prevnar 37 or the adult Pneumonia vaccine.  Flu Shots are still available at our office. If you still haven't had one please call to set up a nurse visit to get one.   After your visit with [place] today you will receive a survey in the mail or online from [employer] regarding your care with [place]. Please take a moment to  fill this out. Your feedback is very important to [place] as you can help [place] better understand your patient needs as well as improve your experience and satisfaction. WE CARE ABOUT YOU!!!   The patient should use a cool mist humidifier at home and dry to drink as many fluids as possible She should continue to watch her sodium intake She should put on support hose as soon as she gets out of bed in the morning She should try to elevate her legs during the day She needs to be more active physically with walking etc. Take the prednisone as directed and see if this helps her skin situation. Also see if it helps the numbness in the fingers of the left hand. If the edema does not get improved in the left leg we will need to do venous Dopplers of both legs. Try the support hose first and if no better with the edema call back and we will arrange to get venous Dopplers in 2-3 weeks.

## 2014-10-05 NOTE — Progress Notes (Signed)
Subjective:    Patient ID: Janice Reed, female    DOB: 1922/01/09, 81 y.o.   MRN: [id number]  HPI Pt here for follow up and management of chronic medical problems which includes hypothyroid, hyperlipidemia, and hypertension. She is taking medications regularly. The patient has several complaints. One of them is dry skin and itches at times. She says this is been worse and she took the polio. She also complains of the fingers and her left hand going to sleep and this is the fourth and fifth fingers. She complains of some edema in her legs. The left is worse than the right. She does not use a lot of salt.          Patient Active Problem List   Diagnosis Date Noted  . Benign essential HTN 05/22/2014  . Macular degeneration of both eyes 03/12/2014  . Internal hemorrhoids without mention of complication 01/28/7627  . Immune fecal occult blood test + x 2 01/05/2012  . Cancer of right breast 09/28/2011  . Hearing loss 12/27/2010  . Arthritis 12/27/2010  . Hypertension   . Hyperlipidemia   . Phlebitis and thrombophlebitis of unspecified site   . Symptomatic menopausal or female climacteric states   . Malaise and fatigue   . Palpitations   . Nonspecific abnormal electrocardiogram (ECG) (EKG)   . Hypothyroidism   . Osteoporosis   . IBS (irritable bowel syndrome)    Outpatient Encounter Prescriptions as of 10/05/2014  Medication Sig  . Aflibercept (EYLEA IO) Inject into the eye every 30 (thirty) days.   [name] Kitchen aspirin 325 MG tablet Take 325 mg by mouth See admin instructions. Take one capsule a day and more if needed for pain - up to 3 times daily  . benazepril (LOTENSIN) 10 MG tablet Take 1 tablet by mouth  every day  . bevacizumab (AVASTIN) 1.25 mg/0.1 mL SOLN 1.25 mg by Intravitreal route every 30 (thirty) days.   . Calcium-Magnesium-Vitamin D (CITRACAL CALCIUM+D PO) Take 1 capsule by mouth 2 (two) times daily.    . Cholecalciferol (VITAMIN D3) 1000 UNITS CAPS Take 1,000 Units by mouth 2  (two) times daily.   [name] Kitchen levothyroxine (SYNTHROID, LEVOTHROID) 100 MCG tablet TAKE 1 TABLET EVERY DAY  . Multiple Vitamins-Minerals (ICAPS AREDS FORMULA PO) Take 1 capsule by mouth 2 (two) times daily.   . Omega-3 Fatty Acids (FISH OIL) 1000 MG CAPS Take 1,000 mg by mouth 2 (two) times daily.   . ranitidine (ZANTAC) 150 MG tablet Take 150 mg by mouth 2 (two) times daily as needed for heartburn.  . triamterene-hydrochlorothiazide (MAXZIDE-25) 37.5-25 MG per tablet Take 1 tablet by mouth  every day as directed  . [DISCONTINUED] ciprofloxacin (CIPRO) 250 MG tablet Take 1 tablet (250 mg total) by mouth 2 (two) times daily.    Review of Systems  Constitutional: Negative.   HENT: Negative.   Eyes: Negative.   Respiratory: Negative.   Cardiovascular: Positive for leg swelling.  Gastrointestinal: Negative.   Endocrine: Negative.   Genitourinary: Negative.   Musculoskeletal: Negative.   Skin: Positive for rash (dry skin).       Left hand/ wrist cyst-like area  Allergic/Immunologic: Negative.   Neurological: Positive for numbness (left hand - 4th and 5th finger ).  Hematological: Negative.   Psychiatric/Behavioral: Negative.        Objective:   Physical Exam  Constitutional: She is oriented to person, place, and time. She appears well-developed and well-nourished.  The patient is pleasant and nicely dressed and appears  much younger than her stated age of 81 years other than her severe kyphosis.  HENT:  Head: Normocephalic and atraumatic.  Right Ear: External ear normal.  Left Ear: External ear normal.  Nose: Nose normal.  Mouth/Throat: Oropharynx is clear and moist.  She is wearing a hearing aid in the right ear canal. She does have nasal congestion.  Eyes: Conjunctivae and EOM are normal. Pupils are equal, round, and reactive to light. Right eye exhibits no discharge. Left eye exhibits no discharge. No scleral icterus.  Neck: Normal range of motion. Neck supple. No thyromegaly present.    No anterior cervical nodes thyromegaly or carotid bruits  Cardiovascular: Normal rate, regular rhythm, normal heart sounds and intact distal pulses.   No murmur heard. The heart is regular at 72/m, pulses were palpable in both feet but more difficult to palpate in the left foot.  Pulmonary/Chest: Effort normal and breath sounds normal. No respiratory distress. She has no wheezes. She has no rales. She exhibits no tenderness.  Abdominal: Soft. Bowel sounds are normal. She exhibits no mass. There is no tenderness. There is no rebound and no guarding.  The abdomen was nontender and there were no masses palpable.  Musculoskeletal: She exhibits edema. She exhibits no tenderness.  The patient has severe kyphosis and uses a cane for ambulation.  Lymphadenopathy:    She has no cervical adenopathy.  Neurological: She is alert and oriented to person, place, and time.  Skin: Skin is warm and dry. Rash noted. No erythema. No pallor.  The patient has a lot of dry skin and subcutaneous atrophy of subcutaneous  Psychiatric: She has a normal mood and affect. Her behavior is normal. Judgment and thought content normal.  Nursing note and vitals reviewed.  BP 139/78 mmHg  Pulse 79  Temp(Src) 97.1 F (36.2 C) (Oral)  Ht '5\' 3"'  (1.6 m)  Wt 116 lb (52.617 kg)  BMI 20.55 kg/m2        Assessment & Plan:  1. Hyperlipidemia -The patient should continue with omega-3 fatty acids and with as aggressive diet habits as possible along with physical activity. - POCT CBC - NMR, lipoprofile  2. Benign essential HTN -The blood pressure is good today and there should be no change in treatment except she should increase the Maxzide to a whole one on Monday Wednesday and Friday and a half a one the other days. This is done more for her increased fluid retention than for her blood pressure control - POCT CBC - BMP8+EGFR - Hepatic function panel  3. Hypothyroidism, unspecified hypothyroidism type -For now,  continue with Levothroid 100 g daily - POCT CBC - Thyroid Panel With TSH  4. Vitamin D deficiency -Continue with current dose of vitamin D pending results of lab work - POCT CBC - Vit D  25 hydroxy (rtn osteoporosis monitoring) - DG Bone Density; Future  5. Cancer of right breast -Continue monthly breast checks and follow-up with mammograms - POCT CBC  6. Rash -Continue with moisturizers like Eucerin - methylPREDNISolone acetate (DEPO-MEDROL) injection 40 mg; Inject 1 mL (40 mg total) into the muscle once.  7. Postmenopausal - DG Bone Density; Future  8. Screening for osteoporosis -This should be done in March and further treatment will be rendered depending upon the results from having taken the prolia - DG Bone Density; Future  9. Edema -Support hose -sodium restriction -If problems continue venous Dopplers   10. Chronic venous insufficiency -If problems continue with edema venous Dopplers and a  couple of weeks  11. Dry skin -Moisturizers and prednisone  Meds ordered this encounter  Medications  . methylPREDNISolone acetate (DEPO-MEDROL) injection 40 mg    Sig:    Patient Instructions                       Medicare Annual Wellness Visit  [facility] and the medical providers at [facility] strive to bring you the best medical care.  In doing so we not only want to address your current medical conditions and concerns but also to detect new conditions early and prevent illness, disease and health-related problems.    Medicare offers a yearly Wellness Visit which allows our clinical staff to assess your need for preventative services including immunizations, lifestyle education, counseling to decrease risk of preventable diseases and screening for fall risk and other medical concerns.    This visit is provided free of charge (no copay) for all Medicare recipients. The clinical pharmacists at [facility] have begun to  conduct these Wellness Visits which will also include a thorough review of all your medications.    As you primary medical provider recommend that you make an appointment for your Annual Wellness Visit if you have not done so already this year.  You may set up this appointment before you leave today or you may call back ([phone]) and schedule an appointment.  Please make sure when you call that you mention that you are scheduling your Annual Wellness Visit with the clinical pharmacist so that the appointment may be made for the proper length of time.     Continue current medications. Continue good therapeutic lifestyle changes which include good diet and exercise. Fall precautions discussed with patient. If an FOBT was given today- please return it to our front desk. If you are over 60 years old - you may need Prevnar 60 or the adult Pneumonia vaccine.  Flu Shots are still available at our office. If you still haven't had one please call to set up a nurse visit to get one.   After your visit with [place] today you will receive a survey in the mail or online from [employer] regarding your care with [place]. Please take a moment to fill this out. Your feedback is very important to [place] as you can help [place] better understand your patient needs as well as improve your experience and satisfaction. WE CARE ABOUT YOU!!!   The patient should use a cool mist humidifier at home and dry to drink as many fluids as possible She should continue to watch her sodium intake She should put on support hose as soon as she gets out of bed in the morning She should try to elevate her legs during the day She needs to be more active physically with walking etc. Take the prednisone as directed and see if this helps her skin situation. Also see if it helps the numbness in the fingers of the left hand. If the edema does not get improved in the left leg we will need to do venous Dopplers of both legs. Try the support hose first and  if no better with the edema call back and we will arrange to get venous Dopplers in 2-3 weeks.   Arrie Senate MD

## 2014-10-06 ENCOUNTER — Telehealth: Payer: Self-pay | Admitting: Family Medicine

## 2014-10-06 ENCOUNTER — Telehealth: Payer: Self-pay | Admitting: *Deleted

## 2014-10-06 DIAGNOSIS — H3532 Exudative age-related macular degeneration: Secondary | ICD-10-CM | POA: Diagnosis not present

## 2014-10-06 LAB — BMP8+EGFR
BUN/Creatinine Ratio: 23 (ref 11–26)
BUN: 24 mg/dL (ref 10–36)
CO2: 28 mmol/L (ref 18–29)
Calcium: 9.9 mg/dL (ref 8.7–10.3)
Chloride: 96 mmol/L — ABNORMAL LOW (ref 97–108)
Creatinine, Ser: 1.03 mg/dL — ABNORMAL HIGH (ref 0.57–1.00)
GFR calc Af Amer: 55 mL/min/{1.73_m2} — ABNORMAL LOW (ref >59)
GFR calc non Af Amer: 48 mL/min/{1.73_m2} — ABNORMAL LOW (ref >59)
Glucose: 89 mg/dL (ref 65–99)
Potassium: 4.9 mmol/L (ref 3.5–5.2)
Sodium: 138 mmol/L (ref 134–144)

## 2014-10-06 LAB — HEPATIC FUNCTION PANEL
ALBUMIN: 3.8 g/dL (ref 3.2–4.6)
ALK PHOS: 72 IU/L (ref 39–117)
ALT: 14 IU/L (ref 0–32)
AST: 19 IU/L (ref 0–40)
BILIRUBIN, DIRECT: 0.08 mg/dL (ref 0.00–0.40)
Bilirubin Total: 0.2 mg/dL (ref 0.0–1.2)
TOTAL PROTEIN: 5.9 g/dL — AB (ref 6.0–8.5)

## 2014-10-06 LAB — THYROID PANEL WITH TSH
Free Thyroxine Index: 3.4 (ref 1.2–4.9)
T3 Uptake Ratio: 29 % (ref 24–39)
T4, Total: 11.6 ug/dL (ref 4.5–12.0)
TSH: 6.05 u[IU]/mL — ABNORMAL HIGH (ref 0.450–4.500)

## 2014-10-06 LAB — VITAMIN D 25 HYDROXY (VIT D DEFICIENCY, FRACTURES): Vit D, 25-Hydroxy: 47.8 ng/mL (ref 30.0–100.0)

## 2014-10-06 LAB — NMR, LIPOPROFILE
Cholesterol: 244 mg/dL — ABNORMAL HIGH (ref 100–199)
HDL CHOLESTEROL BY NMR: 85 mg/dL (ref >39)
HDL PARTICLE NUMBER: 35.9 umol/L (ref >=30.5)
LDL Particle Number: 1449 nmol/L — ABNORMAL HIGH (ref <1000)
LDL Size: 21.2 nm (ref >20.5)
LDL-C: 146 mg/dL — AB (ref 0–99)
LP-IR Score: <25 (ref <=45)
Small LDL Particle Number: <90 nmol/L (ref <=527)
Triglycerides by NMR: 66 mg/dL (ref 0–149)

## 2014-10-06 MED ORDER — LEVOTHYROXINE SODIUM 25 MCG PO TABS: 25.0000 ug | ORAL_TABLET | Freq: Every day | ORAL | Status: DC

## 2014-10-06 NOTE — Telephone Encounter (Signed)
Please call in prescription for 25 g and take one half along with the remaining 100 g that she has at home

## 2014-10-06 NOTE — Telephone Encounter (Signed)
Pt aware and new med sent in

## 2014-10-06 NOTE — Telephone Encounter (Signed)
Lm - labs

## 2014-10-06 NOTE — Telephone Encounter (Signed)
-----   Message from Chipper Herb, MD sent at 10/06/2014  7:57 AM EST ----- The blood sugar is good at 89. The creatinine, the most important kidney function test remains slightly elevated at 1.03. This is consistent with past readings. The electrolytes including potassium are within normal limits except the chloride is slightly decreased and this is also consistent with past readings. Cholesterol numbers with advanced lipid testing remain elevated at with a total LDL particle number at 1449. This is however slightly decreased from 4 months ago. The LDL C is good at 146 and the triglycerides are within normal limits. The patient should continue with aggressive therapeutic lifestyle changes which include as much exercise as possible and sticking with her diet as closely as possible and with taking omega-3 fatty acids. The vitamin D level is good at 47.8. She should continue with her current treatment of vitamin D3. This is 1000 units of D3 daily. The TSH is slightly elevated. This means that she is not getting quite as much thyroid as she needs. Please make sure that she is taking her thyroid medication regularly, if she is taking 100 g daily and not missing any medication, we should have her continue with this and add to this 12.5 g daily. Please call in a separate prescription for this and she should take this along with her current medication every day. She should recheck her thyroid again in 6 weeks.++++++++ All liver function tests are within normal limits except one is slightly decreased.

## 2014-10-06 NOTE — Telephone Encounter (Signed)
Would you like me to send over rx for synthroid 129mcg because the pt is already taking 142mcg and 12.5 mcg isn't a choice.

## 2014-10-08 DIAGNOSIS — H3532 Exudative age-related macular degeneration: Secondary | ICD-10-CM | POA: Diagnosis not present

## 2014-10-27 ENCOUNTER — Telehealth: Payer: Self-pay | Admitting: Family Medicine

## 2014-10-27 NOTE — Telephone Encounter (Signed)
Patients questions answered

## 2014-11-04 ENCOUNTER — Ambulatory Visit (INDEPENDENT_AMBULATORY_CARE_PROVIDER_SITE_OTHER): Payer: Medicare Other

## 2014-11-04 ENCOUNTER — Ambulatory Visit (INDEPENDENT_AMBULATORY_CARE_PROVIDER_SITE_OTHER): Payer: Medicare Other | Admitting: Pharmacist

## 2014-11-04 ENCOUNTER — Encounter: Payer: Self-pay | Admitting: Pharmacist

## 2014-11-04 VITALS — BP 138/80 | HR 74 | Ht 63.0 in | Wt 116.0 lb

## 2014-11-04 DIAGNOSIS — Z780 Asymptomatic menopausal state: Secondary | ICD-10-CM | POA: Diagnosis not present

## 2014-11-04 DIAGNOSIS — E038 Other specified hypothyroidism: Secondary | ICD-10-CM

## 2014-11-04 DIAGNOSIS — Z0000 Encounter for general adult medical examination without abnormal findings: Secondary | ICD-10-CM | POA: Diagnosis not present

## 2014-11-04 DIAGNOSIS — M810 Age-related osteoporosis without current pathological fracture: Secondary | ICD-10-CM

## 2014-11-04 DIAGNOSIS — Z13820 Encounter for screening for osteoporosis: Secondary | ICD-10-CM

## 2014-11-04 DIAGNOSIS — E559 Vitamin D deficiency, unspecified: Secondary | ICD-10-CM

## 2014-11-04 MED ORDER — RISEDRONATE SODIUM 35 MG PO TABS: 35.0000 mg | ORAL_TABLET | ORAL | Status: DC

## 2014-11-04 NOTE — Progress Notes (Addendum)
Patient ID: Janice Reed, female   DOB: [birth year], 81 y.o.   MRN: [id number]    Subjective:   Janice Reed is a 81 y.o. female who presents for an Initial Medicare Annual Wellness Visit and to review DEXA results / osteoporosis  HPI: Patient with osteoporosis.   Previsoulsy treated with Prolia but stopped at patient request last injection was August 2015  Back Pain?  Yes       Kyphosis?  Yes Prior fracture?  Yes - lumbar / compression fracture Med(s) for Osteoporosis/Osteopenia:  none Med(s) previously tried for Osteoporosis/Osteopenia:  Actonel - took for several years without any problems;  Took fosamax but had espophagitis;  Took 3 injection of Prolia but has rash that she felt was related so stopped - rash resolved.                                                               Current Medications (verified) Outpatient Encounter Prescriptions as of 11/04/2014  Medication Sig  . Aflibercept (EYLEA IO) Inject into the eye every 30 (thirty) days.   [name] Kitchen aspirin 325 MG tablet Take 325 mg by mouth See admin instructions. Take one capsule a day and more if needed for pain - up to 3 times daily  . benazepril (LOTENSIN) 10 MG tablet Take 1 tablet by mouth  every day  . bevacizumab (AVASTIN) 1.25 mg/0.1 mL SOLN 1.25 mg by Intravitreal route every 30 (thirty) days.   . Calcium-Magnesium-Vitamin D (CITRACAL CALCIUM+D PO) Take 1 capsule by mouth 2 (two) times daily.    . Cholecalciferol (VITAMIN D3) 1000 UNITS CAPS Take 1,000 Units by mouth 2 (two) times daily.   [name] Kitchen levothyroxine (SYNTHROID, LEVOTHROID) 100 MCG tablet TAKE 1 TABLET EVERY DAY  . levothyroxine (SYNTHROID, LEVOTHROID) 25 MCG tablet Take 1 tablet (25 mcg total) by mouth daily before breakfast. As directed  . Multiple Vitamins-Minerals (ICAPS AREDS FORMULA PO) Take 1 capsule by mouth 2 (two) times daily.   . Omega-3 Fatty Acids (FISH OIL) 1000 MG CAPS Take 1,000 mg by mouth 2 (two) times daily.   . ranitidine (ZANTAC) 150 MG tablet Take 150  mg by mouth 2 (two) times daily as needed for heartburn.  . risedronate (ACTONEL) 35 MG tablet Take 1 tablet (35 mg total) by mouth every 7 (seven) days. with water on empty stomach, nothing by mouth or lie down for next 30 minutes.  . triamterene-hydrochlorothiazide (MAXZIDE-25) 37.5-25 MG per tablet Take 1 tablet by mouth  every day as directed  . [DISCONTINUED] risedronate (ACTONEL) 35 MG tablet Take 35 mg by mouth every 7 (seven) days. with water on empty stomach, nothing by mouth or lie down for next 30 minutes.    Allergies (verified) Alendronate sodium; Benicar; Celebrex; Clarithromycin; Sulfa antibiotics; and Penicillins   History: Past Medical History  Diagnosis Date  . Benign hypertensive heart disease   . Other and unspecified hyperlipidemia   . Prolapse of vaginal walls without mention of uterine prolapse   . Phlebitis and thrombophlebitis of unspecified site   . Symptomatic menopausal or female climacteric states   . Palpitations   . Nonspecific abnormal electrocardiogram (ECG) (EKG)   . Collagenous colitis   . Unspecified hypothyroidism   . Osteoporosis   . IBS (irritable bowel syndrome)   .  Arthritis   . Breast cancer   . Hypertension   . Hearing loss   . Arthritis pain   . Neuropathy, peripheral   . Diverticulosis of colon   . External hemorrhoid   . Macular degeneration of both eyes    Past Surgical History  Procedure Laterality Date  . Back surgery    . Vaginal hysterectomy    . Neuroplasty / transposition median nerve at carpal tunnel bilateral    . Breast lumpectomy      per medical history form dated 09/27/09.  . Colonoscopy  12/03/2002    Dr. Silvano Rusk  . Carpal tunnel release      bilateral  . Tonsillectomy and adenoidectomy     Family History  Problem Relation Age of Onset  . Heart disease Mother     Heart failure per medical history form dated 09/27/09.  [name] Kitchen Heart disease Father     Heart attack per medical history form dated 09/27/09.  [name] Kitchen  Heart attack Father   . Heart disease Brother     Heart failure per medical history form dated 09/27/09.  . Stroke Brother   . Stroke Brother   . Leukemia Brother   . Colon cancer Neg Hx   . Breast cancer Cousin    Social History   Occupational History  . retired-homemaker    Social History Main Topics  . Smoking status: Never Smoker   . Smokeless tobacco: Never Used  . Alcohol Use: No  . Drug Use: No  . Sexual Activity: No    Do you feel safe at home?  Yes  Dietary issues and exercise activities: Current Exercise Habits:: Home exercise routine, Type of exercise: yoga, Time (Minutes): 15, Frequency (Times/Week): 2, Weekly Exercise (Minutes/Week): 30, Intensity: Moderate  Current Dietary habits:  Patient has good dietary habits.  She has recently started drinking [employer].   Objective:    Today's Vitals   11/04/14 0831 11/04/14 0908  BP: 138/80   Pulse: 74   Height: 5\' 3"  (1.6 m)   Weight: 116 lb (52.617 kg)   PainSc:  0-No pain   Body mass index is 20.55 kg/(m^2).  Calcium Assessment Calcium Intake  # of servings/day  Calcium mg  Milk (8 oz) 1  x  300  = 300mg   Yogurt (4 oz) 0 x  200 = 0  Cheese (1 oz) 0 x  200 = 0  Other Calcium sources   250mg   Ca supplement 600mg  bid = 1200mg    Estimated calcium intake per day 1750mg     Activities of Daily Living In your present state of health, do you have any difficulty performing the following activities: 11/04/2014  Is the patient deaf or have difficulty hearing? Y  Hearing Y  Vision N  Difficulty concentrating or making decisions Y  Walking or climbing stairs? Y  Doing errands, shopping? N  Preparing Food and eating ? N  Using the Toilet? N  In the past six months, have you accidently leaked urine? N  Do you have problems with loss of bowel control? N  Managing your Medications? N  Managing your Finances? N  Housekeeping or managing your Housekeeping? N    Are there smokers in your home  (other than you)? No   Cardiac Risk Factors include: advanced age (>79men, >11 women);family history of premature cardiovascular disease;hypertension  Depression Screen PHQ 2/9 Scores 11/04/2014 10/05/2014 03/12/2014 12/09/2013  PHQ - 2 Score 1 2 0 0  PHQ- 9 Score -  3 - -    Fall Risk Fall Risk  11/04/2014 10/05/2014 03/12/2014 12/09/2013  Falls in the past year? No No - No  Risk for fall due to : - - Impaired vision -    Cognitive Function: MMSE - Mini Mental State Exam 11/04/2014  Orientation to time 5  Orientation to Place 5  Registration 3  Attention/ Calculation 5  Recall 1  Language- name 2 objects 2  Language- repeat 1  Language- follow 3 step command 3  Language- read & follow direction 1  Write a sentence 1  Copy design 1    Immunizations and Health Maintenance Immunization History  Administered Date(s) Administered  . Influenza Whole 04/07/2010  . Influenza,inj,Quad PF,36+ Mos 06/04/2013, 05/22/2014  . Pneumococcal Conjugate-13 06/04/2013  . Pneumococcal Polysaccharide-23 08/07/1998  . Td 02/05/2007   There are no preventive care reminders to display for this patient.  Patient Care Team: Chipper Herb, MD as PCP - General (Family Medicine) Milus Height, MD as Referring Physician (Ophthalmology)  Indicate any recent Medical Services you may have received from other than [facility] providers in the past year (date may be approximate).    Assessment:    Annual Wellness Visit  Osteoporosis - stable BMD but not currently on pharmacotherapy for osteoporosis   Screening Tests Health Maintenance  Topic Date Due  . ZOSTAVAX  11/04/2015 (Originally 01/01/1984)  . INFLUENZA VACCINE  03/08/2015  . DEXA SCAN  11/03/2016  . TETANUS/TDAP  02/04/2017  . COLONOSCOPY  02/22/2022  . PNA vac Low Risk Adult  Completed        Plan:   During the course of the visit Lanisha was educated and counseled about the following appropriate screening and preventive services:   Vaccines  to include Pneumoccal, Influenza, Hepatitis B, Td, Zostavax - Patient is UTD except for Zostavax which she declines to get (she has had shingles in past)  Colorectal cancer screening - UTD  Cardiovascular disease screening - UTd  Diabetes screening - UTD  Bone Denisty / Osteoporosis Screening  - Done today.  Recheck in 2 years.  Start actonel 35mg  1 tablet weekly - reminded to take on empty stomach with full glass of water.  Nothing to eat or drink and no lying down for 30 minutes after taking actonel  Recommended calcium intake of 1200mg  daily from both supplements and diet.  weight bearing exercise recommended.  Continue with Yoga for building core strength.  Mammogram - scheduled for next week  Glaucoma screening / Diabetic Eye Exam - called to get records from Dr Blenda Mounts office.  Per staff patient has been seen regularly for macular degenration but has not had general eye exam recently.  Staff will alert Dr Oval Linsey to this and appt to be scheduled.   Advanced Directives - UTD.  Patinet encouraged to bring into office for files.  Recommended using shower stool or chair to prevent falls in shower/bath  Fall prevention discussed.     Patient Instructions (the written plan) were given to the patient.   Cherre Robins, [facility]   11/04/2014     Addendum:  Received reports from Dr. Kathlene November, MD - opthmologist.  Patient has comprehensive eye exam with glaucoma testing 04/2014 Tonometry:  Right eye = 90mmHg  Left eye = 36mmHg

## 2014-11-04 NOTE — Patient Instructions (Addendum)
Exercise for Strong Bones  Exercise is important to build and maintain strong bones / bone density.  There are 2 types of exercises that are important to building and maintaining strong bones:  Weight- bearing and muscle-stregthening.  Weight-bearing Exercises  These exercises include activities that make you move against gravity while staying upright. Weight-bearing exercises can be high-impact or low-impact.  High-impact weight-bearing exercises help build bones and keep them strong. If you have broken a bone due to osteoporosis or are at risk of breaking a bone, you may need to avoid high-impact exercises. If you're not sure, you should check with your healthcare provider.  Examples of high-impact weight-bearing exercises are: Dancing  Doing high-impact aerobics  Hiking  Jogging/running  Jumping Rope  Stair climbing  Tennis  Low-impact weight-bearing exercises can also help keep bones strong and are a safe alternative if you cannot do high-impact exercises.   Examples of low-impact weight-bearing exercises are: Using elliptical training machines  Doing low-impact aerobics  Using stair-step machines  Fast walking on a treadmill or outside   Muscle-Strengthening Exercises These exercises include activities where you move your body, a weight or some other resistance against gravity. They are also known as resistance exercises and include: Lifting weights  Using elastic exercise bands  Using weight machines  Lifting your own body weight  Functional movements, such as standing and rising up on your toes  Yoga and Pilates can also improve strength, balance and flexibility. However, certain positions may not be safe for people with osteoporosis or those at increased risk of broken bones. For example, exercises that have you bend forward may increase the chance of breaking a bone in the spine.   Non-Impact Exercises There are other types of exercises that can help  prevent falls.  Non-impact exercises can help you to improve balance, posture and how well you move in everyday activities. Some of these exercises include: Balance exercises that strengthen your legs and test your balance, such as Tai Chi, can decrease your risk of falls.  Posture exercises that improve your posture and reduce rounded or "sloping" shoulders can help you decrease the chance of breaking a bone, especially in the spine.  Functional exercises that improve how well you move can help you with everyday activities and decrease your chance of falling and breaking a bone. For example, if you have trouble getting up from a chair or climbing stairs, you should do these activities as exercises.   **A physical therapist can teach you balance, posture and functional exercises. He/she can also help you learn which exercises are safe and appropriate for you.  [facility] has a physical therapy office in [place] in front of our office and referrals can be made for assessments and treatment as needed and strength and balance training.  If you would like to have an assessment with [facility] and our physical therapy team please let a nurse or provider know.   Fall Prevention and Home Safety Falls cause injuries and can affect all age groups. It is possible to use preventive measures to significantly decrease the likelihood of falls. There are many simple measures which can make your home safer and prevent falls. OUTDOORS  Repair cracks and edges of walkways and driveways.  Remove high doorway thresholds.  Trim shrubbery on the main path into your home.  Have good outside lighting.  Clear walkways of tools, rocks, debris, and clutter.  Check that handrails are not broken and are securely fastened. Both sides of steps   should have handrails.  Have leaves, snow, and ice cleared regularly.  Use sand or salt on walkways during winter months.  In the garage, clean up grease or oil  spills. BATHROOM  Install night lights.  Install grab bars by the toilet and in the tub and shower.  Use non-skid mats or decals in the tub or shower.  Place a plastic non-slip stool in the shower to sit on, if needed.  Keep floors dry and clean up all water on the floor immediately.  Remove soap buildup in the tub or shower on a regular basis.  Secure bath mats with non-slip, double-sided rug tape.  Remove throw rugs and tripping hazards from the floors. BEDROOMS  Install night lights.  Make sure a bedside light is easy to reach.  Do not use oversized bedding.  Keep a telephone by your bedside.  Have a firm chair with side arms to use for getting dressed.  Remove throw rugs and tripping hazards from the floor. KITCHEN  Keep handles on pots and pans turned toward the center of the stove. Use back burners when possible.  Clean up spills quickly and allow time for drying.  Avoid walking on wet floors.  Avoid hot utensils and knives.  Position shelves so they are not too high or low.  Place commonly used objects within easy reach.  If necessary, use a sturdy step stool with a grab bar when reaching.  Keep electrical cables out of the way.  Do not use floor polish or wax that makes floors slippery. If you must use wax, use non-skid floor wax.  Remove throw rugs and tripping hazards from the floor. STAIRWAYS  Never leave objects on stairs.  Place handrails on both sides of stairways and use them. Fix any loose handrails. Make sure handrails on both sides of the stairways are as long as the stairs.  Check carpeting to make sure it is firmly attached along stairs. Make repairs to worn or loose carpet promptly.  Avoid placing throw rugs at the top or bottom of stairways, or properly secure the rug with carpet tape to prevent slippage. Get rid of throw rugs, if possible.  Have an electrician put in a light switch at the top and bottom of the stairs. OTHER FALL  PREVENTION TIPS  Wear low-heel or rubber-soled shoes that are supportive and fit well. Wear closed toe shoes.  When using a stepladder, make sure it is fully opened and both spreaders are firmly locked. Do not climb a closed stepladder.  Add color or contrast paint or tape to grab bars and handrails in your home. Place contrasting color strips on first and last steps.  Learn and use mobility aids as needed. Install an electrical emergency response system.  Turn on lights to avoid dark areas. Replace light bulbs that burn out immediately. Get light switches that glow.  Arrange furniture to create clear pathways. Keep furniture in the same place.  Firmly attach carpet with non-skid or double-sided tape.  Eliminate uneven floor surfaces.  Select a carpet pattern that does not visually hide the edge of steps.  Be aware of all pets. OTHER HOME SAFETY TIPS  Set the water temperature for 120 F (48.8 C).  Keep emergency numbers on or near the telephone.  Keep smoke detectors on every level of the home and near sleeping areas. Document Released: 07/14/2002 Document Revised: 01/23/2012 Document Reviewed: 10/13/2011 Exit[employer] Patient Information 2015 [employer]. This information is not intended to replace advice given   to you by your health care provider. Make sure you discuss any questions you have with your health care provider.  Preventive Care for Adults A healthy lifestyle and preventive care can promote health and wellness. Preventive health guidelines for women include the following key practices.  A routine yearly physical is a good way to check with your health care provider about your health and preventive screening. It is a chance to share any concerns and updates on your health and to receive a thorough exam.  Visit your dentist for a routine exam and preventive care every 6 months. Brush your teeth twice a day and floss once a day. Good oral hygiene prevents tooth decay and  gum disease.  The frequency of eye exams is based on your age, health, family medical history, use of contact lenses, and other factors. Follow your health care provider's recommendations for frequency of eye exams.  Eat a healthy diet. Foods like vegetables, fruits, whole grains, low-fat dairy products, and lean protein foods contain the nutrients you need without too many calories. Decrease your intake of foods high in solid fats, added sugars, and salt. Eat the right amount of calories for you.Get information about a proper diet from your health care provider, if necessary.  Regular physical exercise is one of the most important things you can do for your health. Most adults should get at least 150 minutes of moderate-intensity exercise (any activity that increases your heart rate and causes you to sweat) each week. In addition, most adults need muscle-strengthening exercises on 2 or more days a week.  Maintain a healthy weight. The body mass index (BMI) is a screening tool to identify possible weight problems. It provides an estimate of body fat based on height and weight. Your health care provider can find your BMI and can help you achieve or maintain a healthy weight.For adults 20 years and older:  A BMI below 18.5 is considered underweight.  A BMI of 18.5 to 24.9 is normal.  A BMI of 25 to 29.9 is considered overweight.  A BMI of 30 and above is considered obese.  Maintain normal blood lipids and cholesterol levels by exercising and minimizing your intake of saturated fat. Eat a balanced diet with plenty of fruit and vegetables. Blood tests for lipids and cholesterol should begin at age 69 and be repeated every 5 years. If your lipid or cholesterol levels are high, you are over 50, or you are at high risk for heart disease, you may need your cholesterol levels checked more frequently.Ongoing high lipid and cholesterol levels should be treated with medicines if diet and exercise are not  working.  If you smoke, find out from your health care provider how to quit. If you do not use tobacco, do not start.  Lung cancer screening is recommended for adults aged 67-80 years who are at high risk for developing lung cancer because of a history of smoking. A yearly low-dose CT scan of the lungs is recommended for people who have at least a 30-pack-year history of smoking and are a current smoker or have quit within the past 15 years. A pack year of smoking is smoking an average of 1 pack of cigarettes a day for 1 year (for example: 1 pack a day for 30 years or 2 packs a day for 15 years). Yearly screening should continue until the smoker has stopped smoking for at least 15 years. Yearly screening should be stopped for people who develop a health problem  that would prevent them from having lung cancer treatment.  If you are pregnant, do not drink alcohol. If you are breastfeeding, be very cautious about drinking alcohol. If you are not pregnant and choose to drink alcohol, do not have more than 1 drink per day. One drink is considered to be 12 ounces (355 mL) of beer, 5 ounces (148 mL) of wine, or 1.5 ounces (44 mL) of liquor.  Avoid use of street drugs. Do not share needles with anyone. Ask for help if you need support or instructions about stopping the use of drugs.  High blood pressure causes heart disease and increases the risk of stroke. Your blood pressure should be checked at least every 1 to 2 years. Ongoing high blood pressure should be treated with medicines if weight loss and exercise do not work.  If you are 81-18 years old, ask your health care provider if you should take aspirin to prevent strokes.  Diabetes screening involves taking a blood sample to check your fasting blood sugar level. This should be done once every 3 years, after age 30, if you are within normal weight and without risk factors for diabetes. Testing should be considered at a younger age or be carried out more  frequently if you are overweight and have at least 1 risk factor for diabetes.  Breast cancer screening is essential preventive care for women. You should practice "breast self-awareness." This means understanding the normal appearance and feel of your breasts and may include breast self-examination. Any changes detected, no matter how small, should be reported to a health care provider. Women in their 71s and 30s should have a clinical breast exam (CBE) by a health care provider as part of a regular health exam every 1 to 3 years. After age 33, women should have a CBE every year. Starting at age 52, women should consider having a mammogram (breast X-ray test) every year. Women who have a family history of breast cancer should talk to their health care provider about genetic screening. Women at a high risk of breast cancer should talk to their health care providers about having an MRI and a mammogram every year.  Breast cancer gene (BRCA)-related cancer risk assessment is recommended for women who have family members with BRCA-related cancers. BRCA-related cancers include breast, ovarian, tubal, and peritoneal cancers. Having family members with these cancers may be associated with an increased risk for harmful changes (mutations) in the breast cancer genes BRCA1 and BRCA2. Results of the assessment will determine the need for genetic counseling and BRCA1 and BRCA2 testing.  Routine pelvic exams to screen for cancer are no longer recommended for nonpregnant women who are considered low risk for cancer of the pelvic organs (ovaries, uterus, and vagina) and who do not have symptoms. Ask your health care provider if a screening pelvic exam is right for you.  If you have had past treatment for cervical cancer or a condition that could lead to cancer, you need Pap tests and screening for cancer for at least 20 years after your treatment. If Pap tests have been discontinued, your risk factors (such as having a new  sexual partner) need to be reassessed to determine if screening should be resumed. Some women have medical problems that increase the chance of getting cervical cancer. In these cases, your health care provider may recommend more frequent screening and Pap tests.  The HPV test is an additional test that may be used for cervical cancer screening. The HPV test  looks for the virus that can cause the cell changes on the cervix. The cells collected during the Pap test can be tested for HPV. The HPV test could be used to screen women aged 60 years and older, and should be used in women of any age who have unclear Pap test results. After the age of 15, women should have HPV testing at the same frequency as a Pap test.  Colorectal cancer can be detected and often prevented. Most routine colorectal cancer screening begins at the age of 19 years and continues through age 21 years. However, your health care provider may recommend screening at an earlier age if you have risk factors for colon cancer. On a yearly basis, your health care provider may provide home test kits to check for hidden blood in the stool. Use of a small camera at the end of a tube, to directly examine the colon (sigmoidoscopy or colonoscopy), can detect the earliest forms of colorectal cancer. Talk to your health care provider about this at age 17, when routine screening begins. Direct exam of the colon should be repeated every 5-10 years through age 15 years, unless early forms of pre-cancerous polyps or small growths are found.  People who are at an increased risk for hepatitis B should be screened for this virus. You are considered at high risk for hepatitis B if:  You were born in a country where hepatitis B occurs often. Talk with your health care provider about which countries are considered high risk.  Your parents were born in a high-risk country and you have not received a shot to protect against hepatitis B (hepatitis B  vaccine).  You have HIV or AIDS.  You use needles to inject street drugs.  You live with, or have sex with, someone who has hepatitis B.  You get hemodialysis treatment.  You take certain medicines for conditions like cancer, organ transplantation, and autoimmune conditions.  Hepatitis C blood testing is recommended for all people born from [birth year] through 1963 and any individual with known risks for hepatitis C.  Practice safe sex. Use condoms and avoid high-risk sexual practices to reduce the spread of sexually transmitted infections (STIs). STIs include gonorrhea, chlamydia, syphilis, trichomonas, herpes, HPV, and human immunodeficiency virus (HIV). Herpes, HIV, and HPV are viral illnesses that have no cure. They can result in disability, cancer, and death.  You should be screened for sexually transmitted illnesses (STIs) including gonorrhea and chlamydia if:  You are sexually active and are younger than 24 years.  You are older than 24 years and your health care provider tells you that you are at risk for this type of infection.  Your sexual activity has changed since you were last screened and you are at an increased risk for chlamydia or gonorrhea. Ask your health care provider if you are at risk.  If you are at risk of being infected with HIV, it is recommended that you take a prescription medicine daily to prevent HIV infection. This is called preexposure prophylaxis (PrEP). You are considered at risk if:  You are a heterosexual woman, are sexually active, and are at increased risk for HIV infection.  You take drugs by injection.  You are sexually active with a partner who has HIV.  Talk with your health care provider about whether you are at high risk of being infected with HIV. If you choose to begin PrEP, you should first be tested for HIV. You should then be tested every 3 months  for as long as you are taking PrEP.  Osteoporosis is a disease in which the bones lose minerals  and strength with aging. This can result in serious bone fractures or breaks. The risk of osteoporosis can be identified using a bone density scan. Women ages 70 years and over and women at risk for fractures or osteoporosis should discuss screening with their health care providers. Ask your health care provider whether you should take a calcium supplement or vitamin D to reduce the rate of osteoporosis.  Menopause can be associated with physical symptoms and risks. Hormone replacement therapy is available to decrease symptoms and risks. You should talk to your health care provider about whether hormone replacement therapy is right for you.  Use sunscreen. Apply sunscreen liberally and repeatedly throughout the day. You should seek shade when your shadow is shorter than you. Protect yourself by wearing long sleeves, pants, a wide-brimmed hat, and sunglasses year round, whenever you are outdoors.  Once a month, do a whole body skin exam, using a mirror to look at the skin on your back. Tell your health care provider of new moles, moles that have irregular borders, moles that are larger than a pencil eraser, or moles that have changed in shape or color.  Stay current with required vaccines (immunizations).  Influenza vaccine. All adults should be immunized every year.  Tetanus, diphtheria, and acellular pertussis (Td, Tdap) vaccine. Pregnant women should receive 1 dose of Tdap vaccine during each pregnancy. The dose should be obtained regardless of the length of time since the last dose. Immunization is preferred during the 27th-36th week of gestation. An adult who has not previously received Tdap or who does not know her vaccine status should receive 1 dose of Tdap. This initial dose should be followed by tetanus and diphtheria toxoids (Td) booster doses every 10 years. Adults with an unknown or incomplete history of completing a 3-dose immunization series with Td-containing vaccines should begin or  complete a primary immunization series including a Tdap dose. Adults should receive a Td booster every 10 years.  Varicella vaccine. An adult without evidence of immunity to varicella should receive 2 doses or a second dose if she has previously received 1 dose. Pregnant females who do not have evidence of immunity should receive the first dose after pregnancy. This first dose should be obtained before leaving the health care facility. The second dose should be obtained 4-8 weeks after the first dose.  Human papillomavirus (HPV) vaccine. Females aged 13-26 years who have not received the vaccine previously should obtain the 3-dose series. The vaccine is not recommended for use in pregnant females. However, pregnancy testing is not needed before receiving a dose. If a female is found to be pregnant after receiving a dose, no treatment is needed. In that case, the remaining doses should be delayed until after the pregnancy. Immunization is recommended for any person with an immunocompromised condition through the age of 70 years if she did not get any or all doses earlier. During the 3-dose series, the second dose should be obtained 4-8 weeks after the first dose. The third dose should be obtained 24 weeks after the first dose and 16 weeks after the second dose.  Zoster vaccine. One dose is recommended for adults aged 72 years or older unless certain conditions are present.  Measles, mumps, and rubella (MMR) vaccine. Adults born before [birth year] generally are considered immune to measles and mumps. Adults born in [birth year] or later should have 1 or  more doses of MMR vaccine unless there is a contraindication to the vaccine or there is laboratory evidence of immunity to each of the three diseases. A routine second dose of MMR vaccine should be obtained at least 28 days after the first dose for students attending postsecondary schools, health care workers, or international travelers. People who received inactivated  measles vaccine or an unknown type of measles vaccine during 1963-1967 should receive 2 doses of MMR vaccine. People who received inactivated mumps vaccine or an unknown type of mumps vaccine before 1979 and are at high risk for mumps infection should consider immunization with 2 doses of MMR vaccine. For females of childbearing age, rubella immunity should be determined. If there is no evidence of immunity, females who are not pregnant should be vaccinated. If there is no evidence of immunity, females who are pregnant should delay immunization until after pregnancy. Unvaccinated health care workers born before [birth year] who lack laboratory evidence of measles, mumps, or rubella immunity or laboratory confirmation of disease should consider measles and mumps immunization with 2 doses of MMR vaccine or rubella immunization with 1 dose of MMR vaccine.  Pneumococcal 13-valent conjugate (PCV13) vaccine. When indicated, a person who is uncertain of her immunization history and has no record of immunization should receive the PCV13 vaccine. An adult aged 90 years or older who has certain medical conditions and has not been previously immunized should receive 1 dose of PCV13 vaccine. This PCV13 should be followed with a dose of pneumococcal polysaccharide (PPSV23) vaccine. The PPSV23 vaccine dose should be obtained at least 8 weeks after the dose of PCV13 vaccine. An adult aged 40 years or older who has certain medical conditions and previously received 1 or more doses of PPSV23 vaccine should receive 1 dose of PCV13. The PCV13 vaccine dose should be obtained 1 or more years after the last PPSV23 vaccine dose.  Pneumococcal polysaccharide (PPSV23) vaccine. When PCV13 is also indicated, PCV13 should be obtained first. All adults aged 24 years and older should be immunized. An adult younger than age 30 years who has certain medical conditions should be immunized. Any person who resides in a nursing home or long-term care  facility should be immunized. An adult smoker should be immunized. People with an immunocompromised condition and certain other conditions should receive both PCV13 and PPSV23 vaccines. People with human immunodeficiency virus (HIV) infection should be immunized as soon as possible after diagnosis. Immunization during chemotherapy or radiation therapy should be avoided. Routine use of PPSV23 vaccine is not recommended for American Indians, [place] Natives, or people younger than 65 years unless there are medical conditions that require PPSV23 vaccine. When indicated, people who have unknown immunization and have no record of immunization should receive PPSV23 vaccine. One-time revaccination 5 years after the first dose of PPSV23 is recommended for people aged 19-64 years who have chronic kidney failure, nephrotic syndrome, asplenia, or immunocompromised conditions. People who received 1-2 doses of PPSV23 before age 42 years should receive another dose of PPSV23 vaccine at age 93 years or later if at least 5 years have passed since the previous dose. Doses of PPSV23 are not needed for people immunized with PPSV23 at or after age 75 years.  Meningococcal vaccine. Adults with asplenia or persistent complement component deficiencies should receive 2 doses of quadrivalent meningococcal conjugate (MenACWY-D) vaccine. The doses should be obtained at least 2 months apart. Microbiologists working with certain meningococcal bacteria, [place] recruits, people at risk during an outbreak, and people who travel  to or live in countries with a high rate of meningitis should be immunized. A first-year college student up through age 72 years who is living in a residence hall should receive a dose if she did not receive a dose on or after her 16th birthday. Adults who have certain high-risk conditions should receive one or more doses of vaccine.  Hepatitis A vaccine. Adults who wish to be protected from this disease, have certain  high-risk conditions, work with hepatitis A-infected animals, work in hepatitis A research labs, or travel to or work in countries with a high rate of hepatitis A should be immunized. Adults who were previously unvaccinated and who anticipate close contact with an international adoptee during the first 60 days after arrival in the [place] from a country with a high rate of hepatitis A should be immunized.  Hepatitis B vaccine. Adults who wish to be protected from this disease, have certain high-risk conditions, may be exposed to blood or other infectious body fluids, are household contacts or sex partners of hepatitis B positive people, are clients or workers in certain care facilities, or travel to or work in countries with a high rate of hepatitis B should be immunized.  Haemophilus influenzae type b (Hib) vaccine. A previously unvaccinated person with asplenia or sickle cell disease or having a scheduled splenectomy should receive 1 dose of Hib vaccine. Regardless of previous immunization, a recipient of a hematopoietic stem cell transplant should receive a 3-dose series 6-12 months after her successful transplant. Hib vaccine is not recommended for adults with HIV infection. Preventive Services / Frequency Ages 66 years and over  Blood pressure check.** / Every 1 to 2 years.  Lipid and cholesterol check.** / Every 5 years beginning at age 3 years.  Lung cancer screening. / Every year if you are aged 28-80 years and have a 30-pack-year history of smoking and currently smoke or have quit within the past 15 years. Yearly screening is stopped once you have quit smoking for at least 15 years or develop a health problem that would prevent you from having lung cancer treatment.  Clinical breast exam.** / Every year after age 8 years.  BRCA-related cancer risk assessment.** / For women who have family members with a BRCA-related cancer (breast, ovarian, tubal, or peritoneal  cancers).  Mammogram.** / Every year beginning at age 43 years and continuing for as long as you are in good health. Consult with your health care provider.  Pap test.** / Every 3 years starting at age 38 years through age 22 or 67 years with 3 consecutive normal Pap tests. Testing can be stopped between 65 and 70 years with 3 consecutive normal Pap tests and no abnormal Pap or HPV tests in the past 10 years.  HPV screening.** / Every 3 years from ages 84 years through ages 43 or 11 years with a history of 3 consecutive normal Pap tests. Testing can be stopped between 65 and 70 years with 3 consecutive normal Pap tests and no abnormal Pap or HPV tests in the past 10 years.  Fecal occult blood test (FOBT) of stool. / Every year beginning at age 35 years and continuing until age 58 years. You may not need to do this test if you get a colonoscopy every 10 years.  Flexible sigmoidoscopy or colonoscopy.** / Every 5 years for a flexible sigmoidoscopy or every 10 years for a colonoscopy beginning at age 35 years and continuing until age 74 years.  Hepatitis C blood  test.** / For all people born from [birth year] through 1963 and any individual with known risks for hepatitis C.  Osteoporosis screening.** / A one-time screening for women ages 71 years and over and women at risk for fractures or osteoporosis.  Skin self-exam. / Monthly.  Influenza vaccine. / Every year.  Tetanus, diphtheria, and acellular pertussis (Tdap/Td) vaccine.** / 1 dose of Td every 10 years.  Varicella vaccine.** / Consult your health care provider.  Zoster vaccine.** / 1 dose for adults aged 48 years or older.  Pneumococcal 13-valent conjugate (PCV13) vaccine.** / Consult your health care provider.  Pneumococcal polysaccharide (PPSV23) vaccine.** / 1 dose for all adults aged 57 years and older.  Meningococcal vaccine.** / Consult your health care provider.  Hepatitis A vaccine.** / Consult your health care  provider.  Hepatitis B vaccine.** / Consult your health care provider.  Haemophilus influenzae type b (Hib) vaccine.** / Consult your health care provider. ** Family history and personal history of risk and conditions may change your health care provider's recommendations. Document Released: 09/19/2001 Document Revised: 12/08/2013 Document Reviewed: 12/19/2010 [facility] Patient Information 2015 [place], [place]. This information is not intended to replace advice given to you by your health care provider. Make sure you discuss any questions you have with your health care provider.

## 2014-11-05 LAB — THYROID PANEL WITH TSH
FREE THYROXINE INDEX: 4.1 (ref 1.2–4.9)
T3 UPTAKE RATIO: 31 % (ref 24–39)
T4, Total: 13.1 ug/dL — ABNORMAL HIGH (ref 4.5–12.0)
TSH: 0.442 u[IU]/mL — AB (ref 0.450–4.500)

## 2014-11-11 DIAGNOSIS — Z853 Personal history of malignant neoplasm of breast: Secondary | ICD-10-CM | POA: Diagnosis not present

## 2014-11-12 DIAGNOSIS — H3532 Exudative age-related macular degeneration: Secondary | ICD-10-CM | POA: Diagnosis not present

## 2014-11-17 DIAGNOSIS — H3532 Exudative age-related macular degeneration: Secondary | ICD-10-CM | POA: Diagnosis not present

## 2014-11-23 ENCOUNTER — Telehealth: Payer: Self-pay | Admitting: Family Medicine

## 2014-11-23 NOTE — Telephone Encounter (Signed)
Patient states that she feels like her thyroid med may be too strong she is experiencing tingling in her fingers and hands, she feels real nervous, she can't concentrate, shaky. Please advise. Patient given an appointment for tomorrow to evaluate cyst

## 2014-11-23 NOTE — Telephone Encounter (Signed)
The patient should continue with a slightly reduced thyroid medicine as her lab work indicated that she is getting too much thyroid medicine the only reduce this by a small amount. We will recheck the thyroid tests again in about 6 weeks. Please confirm with her again how she is taking the medicine and look at the last directions how we decreased the amount that she was taking and make sure that she is doing it correctly. Also we will be happy if she would like for [place] to look at the cyst on her hand to further evaluate that and you can schedule an appointment for that.

## 2014-11-24 ENCOUNTER — Ambulatory Visit (INDEPENDENT_AMBULATORY_CARE_PROVIDER_SITE_OTHER): Payer: Medicare Other | Admitting: Family Medicine

## 2014-11-24 ENCOUNTER — Encounter: Payer: Self-pay | Admitting: Family Medicine

## 2014-11-24 VITALS — BP 154/91 | HR 90 | Temp 97.5°F | Ht 63.0 in | Wt 114.0 lb

## 2014-11-24 DIAGNOSIS — R946 Abnormal results of thyroid function studies: Secondary | ICD-10-CM | POA: Diagnosis not present

## 2014-11-24 DIAGNOSIS — M25842 Other specified joint disorders, left hand: Secondary | ICD-10-CM | POA: Diagnosis not present

## 2014-11-24 DIAGNOSIS — R7989 Other specified abnormal findings of blood chemistry: Secondary | ICD-10-CM

## 2014-11-24 NOTE — Patient Instructions (Signed)
Repeat thyroid profile in a couple weeks as already planned----we will adjust her medication further at that time if necessary We will arrange for you to have an appointment with orthopedic surgeon regarding the cyst and the lateral aspect of the left hand. This doctor is Dr. Doran Durand.

## 2014-11-24 NOTE — Progress Notes (Signed)
Subjective:    Patient ID: Janice Reed, female    DOB: 19-Feb-1922, 81 y.o.   MRN: [id number]  HPI Patient here today for a cyst on her left wrist and she is also feeling "jittery and nervous". She feels that her thyroid may need adjusting. Her thyroid medicine was recently adjusted because the TSH was low where she had been taking 25 g one half daily in addition to the 100 g this was reduced to one half of a 25 g on Monday Wednesday and Friday.       Patient Active Problem List   Diagnosis Date Noted  . Benign essential HTN 05/22/2014  . Age-related macular degeneration, wet, both eyes 03/12/2014  . Internal hemorrhoids without mention of complication 51/09/5850  . Immune fecal occult blood test + x 2 01/05/2012  . Cancer of right breast 09/28/2011  . Hearing loss 12/27/2010  . Arthritis 12/27/2010  . Hypertension   . Hyperlipidemia   . Phlebitis and thrombophlebitis of unspecified site   . Symptomatic menopausal or female climacteric states   . Malaise and fatigue   . Palpitations   . Nonspecific abnormal electrocardiogram (ECG) (EKG)   . Hypothyroidism   . Osteoporosis   . IBS (irritable bowel syndrome)    Outpatient Encounter Prescriptions as of 11/24/2014  Medication Sig  . Aflibercept (EYLEA IO) Inject into the eye every 30 (thirty) days.   [name] Kitchen aspirin 325 MG tablet Take 325 mg by mouth See admin instructions. Take one capsule a day and more if needed for pain - up to 3 times daily  . benazepril (LOTENSIN) 10 MG tablet Take 1 tablet by mouth  every day  . bevacizumab (AVASTIN) 1.25 mg/0.1 mL SOLN 1.25 mg by Intravitreal route every 30 (thirty) days.   . Calcium-Magnesium-Vitamin D (CITRACAL CALCIUM+D PO) Take 1 capsule by mouth 2 (two) times daily.    . Cholecalciferol (VITAMIN D3) 1000 UNITS CAPS Take 1,000 Units by mouth 2 (two) times daily.   [name] Kitchen levothyroxine (SYNTHROID, LEVOTHROID) 100 MCG tablet TAKE 1 TABLET EVERY DAY  . levothyroxine (SYNTHROID, LEVOTHROID) 25  MCG tablet Take 1 tablet (25 mcg total) by mouth daily before breakfast. As directed  . Multiple Vitamins-Minerals (ICAPS AREDS FORMULA PO) Take 1 capsule by mouth 2 (two) times daily.   . Omega-3 Fatty Acids (FISH OIL) 1000 MG CAPS Take 1,000 mg by mouth 2 (two) times daily.   . ranitidine (ZANTAC) 150 MG tablet Take 150 mg by mouth 2 (two) times daily as needed for heartburn.  . risedronate (ACTONEL) 35 MG tablet Take 1 tablet (35 mg total) by mouth every 7 (seven) days. with water on empty stomach, nothing by mouth or lie down for next 30 minutes.  . triamterene-hydrochlorothiazide (MAXZIDE-25) 37.5-25 MG per tablet Take 1 tablet by mouth  every day as directed     Review of Systems  Constitutional: Negative.   HENT: Negative.   Eyes: Negative.   Respiratory: Negative.   Cardiovascular: Negative.   Gastrointestinal: Negative.   Endocrine: Negative.   Genitourinary: Negative.   Musculoskeletal: Negative.        Cyst of left hand/ wrist  Skin: Negative.   Allergic/Immunologic: Negative.   Neurological: Negative.   Hematological: Negative.   Psychiatric/Behavioral: The patient is nervous/anxious (and jittery).        Objective:   Physical Exam  Constitutional: No distress.  Alert but small framed and thin  HENT:  Head: Normocephalic and atraumatic.  Eyes: Conjunctivae are normal. Pupils  are equal, round, and reactive to light. Right eye exhibits no discharge. Left eye exhibits no discharge. No scleral icterus.  Neck: Normal range of motion.  Musculoskeletal: She exhibits tenderness. She exhibits no edema.  The patient uses a cane for ambulation. She has a cyst of the left lateral hand that is nontender but apparently getting bigger according to the patient and uncomfortable.  Neurological: She is alert.  Skin: Skin is warm and dry. No rash noted. No erythema. No pallor.  Psychiatric: She has a normal mood and affect. Her behavior is normal. Thought content normal.  Nursing  note and vitals reviewed.  BP 154/91 mmHg  Pulse 90  Temp(Src) 97.5 F (36.4 C) (Oral)  Ht 5\' 3"  (1.6 m)  Wt 114 lb (51.71 kg)  BMI 20.20 kg/m2        Assessment & Plan:  1. Cyst of joint of left hand -The patient will call us back and we will make an appointment for her to see the orthopedic surgeon at a time when her daughter can take her to see him in [place].  2. Low TSH level -The most recent TSH was low and we decreased the patient's thyroid medicine and have plans to recheck another TSH in a couple weeks and we will adjust medicine further at that time  Patient Instructions  Repeat thyroid profile in a couple weeks as already planned----we will adjust her medication further at that time if necessary We will arrange for you to have an appointment with orthopedic surgeon regarding the cyst and the lateral aspect of the left hand. This doctor is Dr. Doran Durand.   Arrie Senate MD

## 2014-11-24 NOTE — Addendum Note (Signed)
Addended by: Zannie Cove on: 11/24/2014 06:22 PM   Modules accepted: Orders

## 2014-12-11 ENCOUNTER — Other Ambulatory Visit (INDEPENDENT_AMBULATORY_CARE_PROVIDER_SITE_OTHER): Payer: Medicare Other

## 2014-12-11 DIAGNOSIS — E038 Other specified hypothyroidism: Secondary | ICD-10-CM | POA: Diagnosis not present

## 2014-12-11 NOTE — Progress Notes (Signed)
Lab only 

## 2014-12-12 LAB — THYROID PANEL WITH TSH
FREE THYROXINE INDEX: 3.7 (ref 1.2–4.9)
T3 Uptake Ratio: 26 % (ref 24–39)
T4, Total: 14.1 ug/dL — ABNORMAL HIGH (ref 4.5–12.0)
TSH: 0.549 u[IU]/mL (ref 0.450–4.500)

## 2014-12-17 DIAGNOSIS — H3532 Exudative age-related macular degeneration: Secondary | ICD-10-CM | POA: Diagnosis not present

## 2014-12-17 DIAGNOSIS — H43813 Vitreous degeneration, bilateral: Secondary | ICD-10-CM | POA: Diagnosis not present

## 2014-12-18 ENCOUNTER — Encounter: Payer: Self-pay | Admitting: Family Medicine

## 2014-12-29 DIAGNOSIS — M67432 Ganglion, left wrist: Secondary | ICD-10-CM | POA: Diagnosis not present

## 2015-01-05 DIAGNOSIS — H3532 Exudative age-related macular degeneration: Secondary | ICD-10-CM | POA: Diagnosis not present

## 2015-01-11 ENCOUNTER — Telehealth: Payer: Self-pay | Admitting: Family Medicine

## 2015-01-12 ENCOUNTER — Encounter: Payer: Self-pay | Admitting: Physician Assistant

## 2015-01-12 ENCOUNTER — Ambulatory Visit (INDEPENDENT_AMBULATORY_CARE_PROVIDER_SITE_OTHER): Payer: Medicare Other | Admitting: Physician Assistant

## 2015-01-12 ENCOUNTER — Telehealth: Payer: Self-pay | Admitting: Family Medicine

## 2015-01-12 VITALS — BP 144/70 | HR 90 | Temp 97.4°F | Ht 63.0 in | Wt 109.0 lb

## 2015-01-12 DIAGNOSIS — R5383 Other fatigue: Secondary | ICD-10-CM | POA: Diagnosis not present

## 2015-01-12 DIAGNOSIS — N3090 Cystitis, unspecified without hematuria: Secondary | ICD-10-CM

## 2015-01-12 DIAGNOSIS — R197 Diarrhea, unspecified: Secondary | ICD-10-CM | POA: Diagnosis not present

## 2015-01-12 DIAGNOSIS — A084 Viral intestinal infection, unspecified: Secondary | ICD-10-CM | POA: Diagnosis not present

## 2015-01-12 DIAGNOSIS — R110 Nausea: Secondary | ICD-10-CM | POA: Diagnosis not present

## 2015-01-12 LAB — POCT CBC
Granulocyte percent: 64.1 %G (ref 37–80)
HEMATOCRIT: 41 % (ref 37.7–47.9)
Hemoglobin: 13.4 g/dL (ref 12.2–16.2)
Lymph, poc: 1.6 (ref 0.6–3.4)
MCH, POC: 30 pg (ref 27–31.2)
MCHC: 32.7 g/dL (ref 31.8–35.4)
MCV: 91.7 fL (ref 80–97)
MPV: 9.8 fL (ref 0–99.8)
POC GRANULOCYTE: 4 (ref 2–6.9)
POC LYMPH %: 25.9 % (ref 10–50)
Platelet Count, POC: 204 10*3/uL (ref 142–424)
RBC: 4.48 M/uL (ref 4.04–5.48)
RDW, POC: 12.8 %
WBC: 6.3 10*3/uL (ref 4.6–10.2)

## 2015-01-12 LAB — POCT UA - MICROSCOPIC ONLY
CRYSTALS, UR, HPF, POC: NEGATIVE
Casts, Ur, LPF, POC: NEGATIVE
MUCUS UA: NEGATIVE
YEAST UA: NEGATIVE

## 2015-01-12 LAB — POCT URINALYSIS DIPSTICK
Bilirubin, UA: NEGATIVE
GLUCOSE UA: NEGATIVE
Ketones, UA: NEGATIVE
Nitrite, UA: NEGATIVE
PH UA: 5
Spec Grav, UA: <=1.005
UROBILINOGEN UA: NEGATIVE

## 2015-01-12 MED ORDER — CIPROFLOXACIN HCL 500 MG PO TABS
500.0000 mg | ORAL_TABLET | Freq: Two times a day (BID) | ORAL | Status: DC
Start: 2015-01-12 — End: 2015-01-27

## 2015-01-12 NOTE — Progress Notes (Signed)
   Subjective:    Patient ID: Janice Reed, female    DOB: 1922/05/11, 81 y.o.   MRN: [id number]  HPI 81 y/o female with h/o IBS presents with c/o 4-5 episodes of diarrhea x 4 days. The BM's are during the day. She is not having episodes at night. States that her appetite is not as usual. She has took immodium with relief. She feels that she is somewhat improving and has not had as many episodes today. Denies associated sick contacts or recent tick bite.     Review of Systems  Constitutional: Positive for fatigue. Negative for fever, chills and diaphoresis.  Gastrointestinal: Positive for nausea (2-3 times x 4 days ) and diarrhea. Negative for vomiting, abdominal pain, constipation and abdominal distention.  Genitourinary:       Darker urine  Neurological: Positive for headaches (2 days ago).  All other systems reviewed and are negative.      Objective:   Physical Exam  Constitutional: She is oriented to person, place, and time. She appears well-developed and well-nourished. No distress.  HENT:  Head: Normocephalic.  Mouth/Throat: Oropharynx is clear and moist.  Cardiovascular: Normal rate, regular rhythm and normal heart sounds.  Exam reveals no gallop and no friction rub.   No murmur heard. Pulmonary/Chest: Effort normal and breath sounds normal. No respiratory distress. She has no wheezes. She has no rales. She exhibits no tenderness.  Abdominal: Soft.  Neurological: She is alert and oriented to person, place, and time.  Skin: No rash noted. She is not diaphoretic. No erythema. No pallor.  Psychiatric: She has a normal mood and affect. Her behavior is normal. Judgment and thought content normal.  Nursing note and vitals reviewed.         Assessment & Plan:  1. Diarrhea  - POCT CBC - CMP14+EGFR - POCT UA - Microscopic Only - POCT urinalysis dipstick - Urine culture  2. Other fatigue  - POCT CBC - CMP14+EGFR - POCT UA - Microscopic Only - POCT urinalysis dipstick -  Urine culture  3. Nausea without vomiting  - POCT CBC - CMP14+EGFR - POCT UA - Microscopic Only - POCT urinalysis dipstick - Urine culture  4. Viral gastroenteritis Clear liquids for 24 hours (like 7-Up, ginger ale, Sprite, Jello, frozen pops) Full liquids the second 24-hours (like potato soup, tomato soup, chicken noodle soup) Bland diet the third 24-hours (boiled and baked foods, no fried or greasy foods) Avoid milk, cheese, ice cream and dairy products for 72 hours. Avoid caffeine (cola drinks, coffee, tea, Mountain Dew, Mellow Yellow) Take in small amounts, but frequently. Tylenol and/or Advil as needed for aches pains and fever  5. Cystitis  - Cipro 500 mg BID x 10 day - F/U in 2 weeks for recheck of urine    Continue all meds Labs pending Health Maintenance reviewed Diet and exercise encouraged RTO 2 weeks   Tiffany A. Benjamin Stain PA-C

## 2015-01-12 NOTE — Patient Instructions (Signed)
Bland diet - bananas, rice, toast, non sugar drinks   Food Choices to Help Relieve Diarrhea When you have diarrhea, the foods you eat and your eating habits are very important. Choosing the right foods and drinks can help relieve diarrhea. Also, because diarrhea can last up to 7 days, you need to replace lost fluids and electrolytes (such as sodium, potassium, and chloride) in order to help prevent dehydration.  WHAT GENERAL GUIDELINES DO I NEED TO FOLLOW?  Slowly drink 1 cup (8 oz) of fluid for each episode of diarrhea. If you are getting enough fluid, your urine will be clear or pale yellow.  Eat starchy foods. Some good choices include white rice, white toast, pasta, low-fiber cereal, baked potatoes (without the skin), saltine crackers, and bagels.  Avoid large servings of any cooked vegetables.  Limit fruit to two servings per day. A serving is  cup or 1 small piece.  Choose foods with less than 2 g of fiber per serving.  Limit fats to less than 8 tsp (38 g) per day.  Avoid fried foods.  Eat foods that have probiotics in them. Probiotics can be found in certain dairy products.  Avoid foods and beverages that may increase the speed at which food moves through the stomach and intestines (gastrointestinal tract). Things to avoid include:  High-fiber foods, such as dried fruit, raw fruits and vegetables, nuts, seeds, and whole grain foods.  Spicy foods and high-fat foods.  Foods and beverages sweetened with high-fructose corn syrup, honey, or sugar alcohols such as xylitol, sorbitol, and mannitol. WHAT FOODS ARE RECOMMENDED? Grains White rice. White, [place], or pita breads (fresh or toasted), including plain rolls, buns, or bagels. White pasta. Saltine, soda, or graham crackers. Pretzels. Low-fiber cereal. Cooked cereals made with water (such as cornmeal, farina, or cream cereals). Plain muffins. Matzo. Melba toast. Zwieback.  Vegetables Potatoes (without the skin). Strained  tomato and vegetable juices. Most well-cooked and canned vegetables without seeds. Tender lettuce. Fruits Cooked or canned applesauce, apricots, cherries, fruit cocktail, grapefruit, peaches, pears, or plums. Fresh bananas, apples without skin, cherries, grapes, cantaloupe, grapefruit, peaches, oranges, or plums.  Meat and Other Protein Products Baked or boiled chicken. Eggs. Tofu. Fish. Seafood. Smooth peanut butter. Ground or well-cooked tender beef, ham, veal, lamb, pork, or poultry.  Dairy Plain yogurt, kefir, and unsweetened liquid yogurt. Lactose-free milk, buttermilk, or soy milk. Plain hard cheese. Beverages Sport drinks. Clear broths. Diluted fruit juices (except prune). Regular, caffeine-free sodas such as ginger ale. Water. Decaffeinated teas. Oral rehydration solutions. Sugar-free beverages not sweetened with sugar alcohols. Other Bouillon, broth, or soups made from recommended foods.  The items listed above may not be a complete list of recommended foods or beverages. Contact your dietitian for more options. WHAT FOODS ARE NOT RECOMMENDED? Grains Whole grain, whole wheat, bran, or rye breads, rolls, pastas, crackers, and cereals. Wild or brown rice. Cereals that contain more than 2 g of fiber per serving. Corn tortillas or taco shells. Cooked or dry oatmeal. Granola. Popcorn. Vegetables Raw vegetables. Cabbage, broccoli, Brussels sprouts, artichokes, baked beans, beet greens, corn, kale, legumes, peas, sweet potatoes, and yams. Potato skins. Cooked spinach and cabbage. Fruits Dried fruit, including raisins and dates. Raw fruits. Stewed or dried prunes. Fresh apples with skin, apricots, mangoes, pears, raspberries, and strawberries.  Meat and Other Protein Products Chunky peanut butter. Nuts and seeds. Beans and lentils. Berniece Salines.  Dairy High-fat cheeses. Milk, chocolate milk, and beverages made with milk, such as milk shakes. Cream. Ice  cream. Sweets and Desserts Sweet rolls,  doughnuts, and sweet breads. Pancakes and waffles. Fats and Oils Butter. Cream sauces. Margarine. Salad oils. Plain salad dressings. Olives. Avocados.  Beverages Caffeinated beverages (such as coffee, tea, soda, or energy drinks). Alcoholic beverages. Fruit juices with pulp. Prune juice. Soft drinks sweetened with high-fructose corn syrup or sugar alcohols. Other Coconut. Hot sauce. Chili powder. Mayonnaise. Gravy. Cream-based or milk-based soups.  The items listed above may not be a complete list of foods and beverages to avoid. Contact your dietitian for more information. WHAT SHOULD I DO IF I BECOME DEHYDRATED? Diarrhea can sometimes lead to dehydration. Signs of dehydration include dark urine and dry mouth and skin. If you think you are dehydrated, you should rehydrate with an oral rehydration solution. These solutions can be purchased at pharmacies, retail stores, or online.  Drink -1 cup (120-240 mL) of oral rehydration solution each time you have an episode of diarrhea. If drinking this amount makes your diarrhea worse, try drinking smaller amounts more often. For example, drink 1-3 tsp (5-15 mL) every 5-10 minutes.  A general rule for staying hydrated is to drink 1-2 L of fluid per day. Talk to your health care provider about the specific amount you should be drinking each day. Drink enough fluids to keep your urine clear or pale yellow. Document Released: 10/14/2003 Document Revised: 07/29/2013 Document Reviewed: 06/16/2013 [facility] Patient Information 2015 [place], [place]. This information is not intended to replace advice given to you by your health care provider. Make sure you discuss any questions you have with your health care provider.

## 2015-01-12 NOTE — Telephone Encounter (Signed)
No appts available with [facility] today. Will have to keep appt with [facility]

## 2015-01-12 NOTE — Telephone Encounter (Signed)
Spoke with pt regarding Cipro She verbalizes understanding

## 2015-01-13 LAB — CMP14+EGFR
ALBUMIN: 3.6 g/dL (ref 3.2–4.6)
ALK PHOS: 87 IU/L (ref 39–117)
ALT: 13 IU/L (ref 0–32)
AST: 21 IU/L (ref 0–40)
Albumin/Globulin Ratio: 1.8 (ref 1.1–2.5)
BUN/Creatinine Ratio: 14 (ref 11–26)
BUN: 14 mg/dL (ref 10–36)
Bilirubin Total: 0.4 mg/dL (ref 0.0–1.2)
CHLORIDE: 81 mmol/L — AB (ref 97–108)
CO2: 24 mmol/L (ref 18–29)
CREATININE: 0.99 mg/dL (ref 0.57–1.00)
Calcium: 9.1 mg/dL (ref 8.7–10.3)
GFR calc non Af Amer: 50 mL/min/{1.73_m2} — ABNORMAL LOW (ref >59)
GFR, EST AFRICAN AMERICAN: 58 mL/min/{1.73_m2} — AB (ref >59)
GLOBULIN, TOTAL: 2 g/dL (ref 1.5–4.5)
GLUCOSE: 103 mg/dL — AB (ref 65–99)
Potassium: 3.6 mmol/L (ref 3.5–5.2)
Sodium: 123 mmol/L — ABNORMAL LOW (ref 134–144)
TOTAL PROTEIN: 5.6 g/dL — AB (ref 6.0–8.5)

## 2015-01-14 ENCOUNTER — Telehealth: Payer: Self-pay | Admitting: Family Medicine

## 2015-01-14 LAB — URINE CULTURE

## 2015-01-14 NOTE — Telephone Encounter (Signed)
Spoke with pt regarding sxs She will try eating bland diet And will call back if sxs persist or worsen

## 2015-01-18 ENCOUNTER — Ambulatory Visit (INDEPENDENT_AMBULATORY_CARE_PROVIDER_SITE_OTHER): Payer: Medicare Other | Admitting: Family

## 2015-01-18 ENCOUNTER — Encounter: Payer: Self-pay | Admitting: Family

## 2015-01-18 VITALS — BP 150/77 | HR 80 | Temp 97.1°F | Ht 63.0 in | Wt 114.6 lb

## 2015-01-18 DIAGNOSIS — Z1839 Other retained organic fragments: Secondary | ICD-10-CM | POA: Diagnosis not present

## 2015-01-18 DIAGNOSIS — S40851A Superficial foreign body of right upper arm, initial encounter: Secondary | ICD-10-CM | POA: Diagnosis not present

## 2015-01-18 NOTE — Progress Notes (Signed)
   Subjective:    Patient ID: Janice Reed, female    DOB: 29-Apr-1922, 81 y.o.   MRN: [id number]  Pt presents to the office today to have a tick removed from her right axillary. Pt states she noticed it last night. Pt states it "seems a little red around it". Pt states she had a GI bug that started last week. Pt states she had diarrhea with N&V. Pt states she was put on cipro and has about 3 days left. Pt states she is feeling better, just weak.  Diarrhea  This is a new problem. The current episode started 1 to 4 weeks ago. The problem occurs less than 2 times per day. The problem has been gradually improving. Pertinent negatives include no bloating or headaches.      Review of Systems  Constitutional: Negative.   HENT: Negative.   Eyes: Negative.   Respiratory: Negative.  Negative for shortness of breath.   Cardiovascular: Negative.  Negative for palpitations.  Gastrointestinal: Positive for diarrhea. Negative for bloating.  Endocrine: Negative.   Genitourinary: Negative.   Musculoskeletal: Negative.   Neurological: Negative.  Negative for headaches.  Hematological: Negative.   Psychiatric/Behavioral: Negative.   All other systems reviewed and are negative.      Objective:   Physical Exam  Constitutional: She is oriented to person, place, and time. She appears well-developed and well-nourished. No distress.  HENT:  Head: Normocephalic and atraumatic.  Eyes: Pupils are equal, round, and reactive to light.  Neck: Normal range of motion. Neck supple. No thyromegaly present.  Cardiovascular: Normal rate, regular rhythm, normal heart sounds and intact distal pulses.   No murmur heard. Pulmonary/Chest: Effort normal and breath sounds normal. No respiratory distress. She has no wheezes.  Abdominal: Soft. Bowel sounds are normal. She exhibits no distension. There is no tenderness.  Musculoskeletal: Normal range of motion. She exhibits no edema or tenderness.  Neurological: She is alert  and oriented to person, place, and time. She has normal reflexes. No cranial nerve deficit.  Skin: Skin is warm and dry.  Embedded tick under right axillary   Psychiatric: She has a normal mood and affect. Her behavior is normal. Judgment and thought content normal.  Vitals reviewed.  Area cleaned Tick removed Antibiotic ointment applied Band-Aid applied   BP 150/77 mmHg  Pulse 80  Temp(Src) 97.1 F (36.2 C) (Oral)  Ht 5\' 3"  (1.6 m)  Wt 114 lb 9.6 oz (51.982 kg)  BMI 20.31 kg/m2      Assessment & Plan:  1. Embedded tick of axilla, right, initial encounter -Report any fever, rash, or joint pain -Wear protective clothing while outside -Wear insect replant -Keep area clean and dry -RTO prn   Evelina Dun, FNP

## 2015-01-18 NOTE — Patient Instructions (Signed)
Tick Bite Information Ticks are insects that attach themselves to the skin and draw blood for food. There are various types of ticks. Common types include wood ticks and deer ticks. Most ticks live in shrubs and grassy areas. Ticks can climb onto your body when you make contact with leaves or grass where the tick is waiting. The most common places on the body for ticks to attach themselves are the scalp, neck, armpits, waist, and groin. Most tick bites are harmless, but sometimes ticks carry germs that cause diseases. These germs can be spread to a person during the tick's feeding process. The chance of a disease spreading through a tick bite depends on:   The type of tick.  Time of year.   How long the tick is attached.   Geographic location.  HOW CAN YOU PREVENT TICK BITES? Take these steps to help prevent tick bites when you are outdoors:  Wear protective clothing. Long sleeves and long pants are best.   Wear white clothes so you can see ticks more easily.  Tuck your pant legs into your socks.   If walking on a trail, stay in the middle of the trail to avoid brushing against bushes.  Avoid walking through areas with long grass.  Put insect repellent on all exposed skin and along boot tops, pant legs, and sleeve cuffs.   Check clothing, hair, and skin repeatedly and before going inside.   Brush off any ticks that are not attached.  Take a shower or bath as soon as possible after being outdoors.  WHAT IS THE PROPER WAY TO REMOVE A TICK? Ticks should be removed as soon as possible to help prevent diseases caused by tick bites. 1. If latex gloves are available, put them on before trying to remove a tick.  2. Using fine-point tweezers, grasp the tick as close to the skin as possible. You may also use curved forceps or a tick removal tool. Grasp the tick as close to its head as possible. Avoid grasping the tick on its body. 3. Pull gently with steady upward pressure until  the tick lets go. Do not twist the tick or jerk it suddenly. This may break off the tick's head or mouth parts. 4. Do not squeeze or crush the tick's body. This could force disease-carrying fluids from the tick into your body.  5. After the tick is removed, wash the bite area and your hands with soap and water or other disinfectant such as alcohol. 6. Apply a small amount of antiseptic cream or ointment to the bite site.  7. Wash and disinfect any instruments that were used.  Do not try to remove a tick by applying a hot match, petroleum jelly, or fingernail polish to the tick. These methods do not work and may increase the chances of disease being spread from the tick bite.  WHEN SHOULD YOU SEEK MEDICAL CARE? Contact your health care provider if you are unable to remove a tick from your skin or if a part of the tick breaks off and is stuck in the skin.  After a tick bite, you need to be aware of signs and symptoms that could be related to diseases spread by ticks. Contact your health care provider if you develop any of the following in the days or weeks after the tick bite:  Unexplained fever.  Rash. A circular rash that appears days or weeks after the tick bite may indicate the possibility of Lyme disease. The rash may resemble   a target with a bull's-eye and may occur at a different part of your body than the tick bite.  Redness and swelling in the area of the tick bite.   Tender, swollen lymph glands.   Diarrhea.   Weight loss.   Cough.   Fatigue.   Muscle, joint, or bone pain.   Abdominal pain.   Headache.   Lethargy or a change in your level of consciousness.  Difficulty walking or moving your legs.   Numbness in the legs.   Paralysis.  Shortness of breath.   Confusion.   Repeated vomiting.  Document Released: 07/21/2000 Document Revised: 05/14/2013 Document Reviewed: 01/01/2013 Exit[employer] Patient Information 2015 [employer]. This information is  not intended to replace advice given to you by your health care provider. Make sure you discuss any questions you have with your health care provider.  

## 2015-01-27 ENCOUNTER — Ambulatory Visit (INDEPENDENT_AMBULATORY_CARE_PROVIDER_SITE_OTHER): Payer: Medicare Other | Admitting: Physician Assistant

## 2015-01-27 ENCOUNTER — Encounter: Payer: Self-pay | Admitting: Physician Assistant

## 2015-01-27 VITALS — BP 146/76 | HR 89 | Temp 97.2°F | Ht 63.0 in | Wt 107.2 lb

## 2015-01-27 DIAGNOSIS — R197 Diarrhea, unspecified: Secondary | ICD-10-CM | POA: Diagnosis not present

## 2015-01-27 DIAGNOSIS — R5383 Other fatigue: Secondary | ICD-10-CM

## 2015-01-27 NOTE — Progress Notes (Signed)
   Subjective:    Patient ID: Janice Reed, female    DOB: 03-23-1922, 81 y.o.   MRN: [id number]  HPI 81 y/o female presents for follow up of viral gastritis with diarrhea x 1 week ago. She states taht she is no longer having diarrhea but has lost weight due to her sickness, which concerns her.     Review of Systems  Constitutional: Positive for fatigue and unexpected weight change (possible due to diarrhea ).  Gastrointestinal: Negative for nausea, vomiting, abdominal pain, diarrhea, constipation and abdominal distention.  Neurological: Positive for weakness.       Objective:   Physical Exam  Constitutional: She is oriented to person, place, and time. She appears well-developed. No distress.  Thin frame   Cardiovascular: Normal rate, regular rhythm, normal heart sounds and intact distal pulses.  Exam reveals no gallop and no friction rub.   No murmur heard. Pulmonary/Chest: Effort normal and breath sounds normal. No respiratory distress. She has no wheezes. She has no rales. She exhibits no tenderness.  Neurological: She is alert and oriented to person, place, and time.  Skin: She is not diaphoretic.  Psychiatric: She has a normal mood and affect. Her behavior is normal. Judgment and thought content normal.  Nursing note and vitals reviewed.         Assessment & Plan:  1. Diarrhea - resolved   2. Fatigue - I feel that this is due to patient's recent viral gatroenteririts and will resolve with time and proper nutrition. I have advised her to eat well over the next few weeks, including Ensure or Boost at least twice daily, if not more. She agrees to do so. I am unsure if the last weight taken was actually accurate due to the significant amount of weight loss in such a short time. She is scheduled to f/u with Dr. Laurance Flatten on July 7th so at that time he can reassess for weight gain.     RTO prn   Janice Reed A. Benjamin Stain PA-C

## 2015-01-27 NOTE — Patient Instructions (Signed)
Drink at least 2 Boost or Ensure daily for weight gain and nutrition. - Drink the one with more nutritional value

## 2015-01-28 DIAGNOSIS — H3532 Exudative age-related macular degeneration: Secondary | ICD-10-CM | POA: Diagnosis not present

## 2015-02-10 ENCOUNTER — Ambulatory Visit (INDEPENDENT_AMBULATORY_CARE_PROVIDER_SITE_OTHER): Payer: Medicare Other | Admitting: Family Medicine

## 2015-02-10 ENCOUNTER — Encounter: Payer: Self-pay | Admitting: Family Medicine

## 2015-02-10 VITALS — BP 108/68 | HR 88 | Temp 98.2°F | Ht 63.0 in | Wt 107.0 lb

## 2015-02-10 DIAGNOSIS — E785 Hyperlipidemia, unspecified: Secondary | ICD-10-CM

## 2015-02-10 DIAGNOSIS — E039 Hypothyroidism, unspecified: Secondary | ICD-10-CM

## 2015-02-10 DIAGNOSIS — I10 Essential (primary) hypertension: Secondary | ICD-10-CM | POA: Diagnosis not present

## 2015-02-10 DIAGNOSIS — N390 Urinary tract infection, site not specified: Secondary | ICD-10-CM

## 2015-02-10 DIAGNOSIS — E559 Vitamin D deficiency, unspecified: Secondary | ICD-10-CM

## 2015-02-10 DIAGNOSIS — R609 Edema, unspecified: Secondary | ICD-10-CM

## 2015-02-10 LAB — POCT UA - MICROSCOPIC ONLY
CASTS, UR, LPF, POC: NEGATIVE
CRYSTALS, UR, HPF, POC: NEGATIVE
MUCUS UA: NEGATIVE
RBC, urine, microscopic: NEGATIVE
Yeast, UA: NEGATIVE

## 2015-02-10 LAB — POCT URINALYSIS DIPSTICK
Bilirubin, UA: NEGATIVE
Blood, UA: NEGATIVE
GLUCOSE UA: NEGATIVE
Ketones, UA: NEGATIVE
Nitrite, UA: NEGATIVE
SPEC GRAV UA: 1.01
UROBILINOGEN UA: NEGATIVE
pH, UA: 6

## 2015-02-10 LAB — POCT CBC
Granulocyte percent: 56 %G (ref 37–80)
HCT, POC: 39.5 % (ref 37.7–47.9)
Hemoglobin: 12.6 g/dL (ref 12.2–16.2)
LYMPH, POC: 1.8 (ref 0.6–3.4)
MCH, POC: 29.1 pg (ref 27–31.2)
MCHC: 32 g/dL (ref 31.8–35.4)
MCV: 90.8 fL (ref 80–97)
MPV: 8.8 fL (ref 0–99.8)
PLATELET COUNT, POC: 198 10*3/uL (ref 142–424)
POC Granulocyte: 2.9 (ref 2–6.9)
POC LYMPH PERCENT: 34.8 %L (ref 10–50)
RBC: 4.35 M/uL (ref 4.04–5.48)
RDW, POC: 14.2 %
WBC: 5.1 10*3/uL (ref 4.6–10.2)

## 2015-02-10 NOTE — Progress Notes (Signed)
Subjective:    Patient ID: Janice Reed, female    DOB: 1921/12/14, 81 y.o.   MRN: [id number]  HPI Pt here for follow up and management of chronic medical problems which includes hypothyroid, hypertension, and hyperlipidemia. She is taking medications regularly. The patient has had a weight loss of 7-9 pounds in the past year. She has had some increased swelling in her feet and ankles also. She has her chronic issues with loose bowel movements but this seems to be stable presently. She is also concerned about a cyst on her left hand that has returned.       Patient Active Problem List   Diagnosis Date Noted  . Benign essential HTN 05/22/2014  . Age-related macular degeneration, wet, both eyes 03/12/2014  . Internal hemorrhoids without mention of complication 01/65/5374  . Immune fecal occult blood test + x 2 01/05/2012  . Cancer of right breast 09/28/2011  . Hearing loss 12/27/2010  . Arthritis 12/27/2010  . Hypertension   . Hyperlipidemia   . Phlebitis and thrombophlebitis of unspecified site   . Symptomatic menopausal or female climacteric states   . Malaise and fatigue   . Palpitations   . Nonspecific abnormal electrocardiogram (ECG) (EKG)   . Hypothyroidism   . Osteoporosis   . IBS (irritable bowel syndrome)    Outpatient Encounter Prescriptions as of 02/10/2015  Medication Sig  . Aflibercept (EYLEA IO) Inject into the eye every 30 (thirty) days.   [name] Kitchen aspirin 325 MG tablet Take 325 mg by mouth See admin instructions. Take one capsule a day and more if needed for pain - up to 3 times daily  . benazepril (LOTENSIN) 10 MG tablet Take 1 tablet by mouth  every day  . bevacizumab (AVASTIN) 1.25 mg/0.1 mL SOLN 1.25 mg by Intravitreal route every 30 (thirty) days.   . Calcium-Magnesium-Vitamin D (CITRACAL CALCIUM+D PO) Take 1 capsule by mouth 2 (two) times daily.    . Cholecalciferol (VITAMIN D3) 1000 UNITS CAPS Take 1,000 Units by mouth 2 (two) times daily.   [name] Kitchen levothyroxine  (SYNTHROID, LEVOTHROID) 100 MCG tablet TAKE 1 TABLET EVERY DAY  . levothyroxine (SYNTHROID, LEVOTHROID) 25 MCG tablet Take 1 tablet (25 mcg total) by mouth daily before breakfast. As directed  . Multiple Vitamins-Minerals (ICAPS AREDS FORMULA PO) Take 1 capsule by mouth 2 (two) times daily.   . Omega-3 Fatty Acids (FISH OIL) 1000 MG CAPS Take 1,000 mg by mouth 2 (two) times daily.   . ranitidine (ZANTAC) 150 MG tablet Take 150 mg by mouth 2 (two) times daily as needed for heartburn.  . risedronate (ACTONEL) 35 MG tablet Take 1 tablet (35 mg total) by mouth every 7 (seven) days. with water on empty stomach, nothing by mouth or lie down for next 30 minutes.  . triamterene-hydrochlorothiazide (MAXZIDE-25) 37.5-25 MG per tablet Take 1 tablet by mouth  every day as directed   No facility-administered encounter medications on file as of 02/10/2015.     Review of Systems  Constitutional: Positive for unexpected weight change (down about 7-9 lbs in last year).  HENT: Negative.   Eyes: Negative.   Respiratory: Negative.   Cardiovascular: Positive for leg swelling (ankles - at times).  Gastrointestinal: Positive for diarrhea (recent trouble - not having right now).  Endocrine: Negative.   Genitourinary: Negative.   Musculoskeletal: Negative.        Cyst on left hand has returned  Skin: Negative.   Allergic/Immunologic: Negative.   Neurological: Negative.   Hematological:  Negative.   Psychiatric/Behavioral: Negative.        Objective:   Physical Exam  Constitutional: She is oriented to person, place, and time. She appears well-developed and well-nourished. No distress.  The patient is pleasant and alert.  HENT:  Head: Normocephalic and atraumatic.  Right Ear: External ear normal.  Left Ear: External ear normal.  Nose: Nose normal.  Mouth/Throat: Oropharynx is clear and moist.  Eyes: Conjunctivae and EOM are normal. Pupils are equal, round, and reactive to light. Right eye exhibits no  discharge. Left eye exhibits no discharge. No scleral icterus.  Neck: Normal range of motion. Neck supple. No thyromegaly present.  Cardiovascular: Normal rate, regular rhythm, normal heart sounds and intact distal pulses.   No murmur heard. At 72/m  Pulmonary/Chest: Effort normal and breath sounds normal. No respiratory distress. She has no wheezes. She has no rales. She exhibits no tenderness.  Clear anteriorly and posteriorly  Abdominal: Soft. Bowel sounds are normal. She exhibits no mass. There is no tenderness. There is no rebound and no guarding.  Without masses or organ enlargement or tenderness  Musculoskeletal: Normal range of motion. She exhibits edema. She exhibits no tenderness.  There is 1+ to 2+ pedal edema on the left and this is the leg where she had phlebitis in the past.  Lymphadenopathy:    She has no cervical adenopathy.  Neurological: She is alert and oriented to person, place, and time. She has normal reflexes. No cranial nerve deficit.  Skin: Skin is warm and dry. No rash noted.  Psychiatric: She has a normal mood and affect. Her behavior is normal. Judgment and thought content normal.  Nursing note and vitals reviewed.  BP 108/68 mmHg  Pulse 88  Temp(Src) 98.2 F (36.8 C) (Oral)  Ht _0  (1.6 m)  Wt 107 lb (48.535 kg)  BMI 18.96 kg/m2        Assessment & Plan:  1. Benign essential HTN -The blood pressure is under good control today and the patient is having no symptoms with it being at the lower end of the normal range. - POCT CBC - BMP8+EGFR - Hepatic function panel  2. Vitamin D deficiency -She should continue her current vitamin D dose pending results of lab work - POCT CBC - Vit D  25 hydroxy (rtn osteoporosis monitoring)  3. Hypothyroidism, unspecified hypothyroidism type -We've had some problems adjusting this in the past and we will not change the current regimen which is 100 g daily and 12.5 g on Monday Wednesday and Friday until the  results of the lab work is returned - POCT CBC - Thyroid Panel With TSH  4. Hyperlipidemia -She should continue with her omega-3 fatty acids and diet regimen until lab work is returned - POCT CBC - Lipid panel  5. Urinary tract infection without hematuria, site unspecified -She is currently not having any symptoms with the urinary tract but is requesting a repeat urinalysis today. - POCT CBC - POCT urinalysis dipstick - POCT UA - Microscopic Only - Urine culture  6. Edema -Watch sodium intake and continue with support hose  Patient Instructions                       Medicare Annual Wellness Visit  [facility] and the medical providers at [facility] strive to bring you the best medical care.  In doing so we not only want to address your current medical conditions and concerns but also to  detect new conditions early and prevent illness, disease and health-related problems.    Medicare offers a yearly Wellness Visit which allows our clinical staff to assess your need for preventative services including immunizations, lifestyle education, counseling to decrease risk of preventable diseases and screening for fall risk and other medical concerns.    This visit is provided free of charge (no copay) for all Medicare recipients. The clinical pharmacists at [facility] have begun to conduct these Wellness Visits which will also include a thorough review of all your medications.    As you primary medical provider recommend that you make an appointment for your Annual Wellness Visit if you have not done so already this year.  You may set up this appointment before you leave today or you may call back [phone]) and schedule an appointment.  Please make sure when you call that you mention that you are scheduling your Annual Wellness Visit with the clinical pharmacist so that the appointment may be made for the proper length of time.     Continue  current medications. Continue good therapeutic lifestyle changes which include good diet and exercise. Fall precautions discussed with patient. If an FOBT was given today- please return it to our front desk. If you are over 56 years old - you may need Prevnar 42 or the adult Pneumonia vaccine.  Flu Shots are still available at our office. If you still haven't had one please call to set up a nurse visit to get one.   After your visit with [place] today you will receive a survey in the mail or online from [employer] regarding your care with [place]. Please take a moment to fill this out. Your feedback is very important to [place] as you can help [place] better understand your patient needs as well as improve your experience and satisfaction. WE CARE ABOUT YOU!!!   The patient should continue to drink plenty of fluids especially water every day She should continue to wear her support hose and put these on the first thing when she arises in the morning She should watch her sodium intake closely She should use her cane and be careful not to put herself at risk for falling We will continue to monitor the cyst on the left hand and if they become a problem especially with pain we will go back and see the previous orthopedic surgeon. Continue to take thyroid medication 100 g daily except one half of a 25 g on Monday Wednesday and Friday until lab work is returned Return the FOBT Retry Ensure maybe 3 days weekly We will call the report on the urinalysis to use it as that becomes available   Arrie Senate MD

## 2015-02-10 NOTE — Patient Instructions (Addendum)
Medicare Annual Wellness Visit  [facility] and the medical providers at [facility] strive to bring you the best medical care.  In doing so we not only want to address your current medical conditions and concerns but also to detect new conditions early and prevent illness, disease and health-related problems.    Medicare offers a yearly Wellness Visit which allows our clinical staff to assess your need for preventative services including immunizations, lifestyle education, counseling to decrease risk of preventable diseases and screening for fall risk and other medical concerns.    This visit is provided free of charge (no copay) for all Medicare recipients. The clinical pharmacists at [facility] have begun to conduct these Wellness Visits which will also include a thorough review of all your medications.    As you primary medical provider recommend that you make an appointment for your Annual Wellness Visit if you have not done so already this year.  You may set up this appointment before you leave today or you may call back ([phone]) and schedule an appointment.  Please make sure when you call that you mention that you are scheduling your Annual Wellness Visit with the clinical pharmacist so that the appointment may be made for the proper length of time.     Continue current medications. Continue good therapeutic lifestyle changes which include good diet and exercise. Fall precautions discussed with patient. If an FOBT was given today- please return it to our front desk. If you are over 62 years old - you may need Prevnar 64 or the adult Pneumonia vaccine.  Flu Shots are still available at our office. If you still haven't had one please call to set up a nurse visit to get one.   After your visit with [place] today you will receive a survey in the mail or online from [employer] regarding your care with [place]. Please take a moment to  fill this out. Your feedback is very important to [place] as you can help [place] better understand your patient needs as well as improve your experience and satisfaction. WE CARE ABOUT YOU!!!   The patient should continue to drink plenty of fluids especially water every day She should continue to wear her support hose and put these on the first thing when she arises in the morning She should watch her sodium intake closely She should use her cane and be careful not to put herself at risk for falling We will continue to monitor the cyst on the left hand and if they become a problem especially with pain we will go back and see the previous orthopedic surgeon. Continue to take thyroid medication 100 g daily except one half of a 25 g on Monday Wednesday and Friday until lab work is returned Return the FOBT Retry Ensure maybe 3 days weekly We will call the report on the urinalysis to use it as that becomes available

## 2015-02-11 LAB — THYROID PANEL WITH TSH
Free Thyroxine Index: 3.9 (ref 1.2–4.9)
T3 Uptake Ratio: 29 % (ref 24–39)
T4, Total: 13.5 ug/dL — ABNORMAL HIGH (ref 4.5–12.0)
TSH: 1.43 u[IU]/mL (ref 0.450–4.500)

## 2015-02-11 LAB — HEPATIC FUNCTION PANEL
ALBUMIN: 4.1 g/dL (ref 3.2–4.6)
ALT: 14 IU/L (ref 0–32)
AST: 16 IU/L (ref 0–40)
Alkaline Phosphatase: 71 IU/L (ref 39–117)
BILIRUBIN, DIRECT: 0.09 mg/dL (ref 0.00–0.40)
Bilirubin Total: 0.3 mg/dL (ref 0.0–1.2)
TOTAL PROTEIN: 6.4 g/dL (ref 6.0–8.5)

## 2015-02-11 LAB — URINE CULTURE

## 2015-02-11 LAB — BMP8+EGFR
BUN/Creatinine Ratio: 21 (ref 11–26)
BUN: 24 mg/dL (ref 10–36)
CALCIUM: 9.9 mg/dL (ref 8.7–10.3)
CO2: 25 mmol/L (ref 18–29)
Chloride: 95 mmol/L — ABNORMAL LOW (ref 97–108)
Creatinine, Ser: 1.13 mg/dL — ABNORMAL HIGH (ref 0.57–1.00)
GFR calc Af Amer: 49 mL/min/{1.73_m2} — ABNORMAL LOW (ref >59)
GFR calc non Af Amer: 43 mL/min/{1.73_m2} — ABNORMAL LOW (ref >59)
GLUCOSE: 97 mg/dL (ref 65–99)
POTASSIUM: 3.9 mmol/L (ref 3.5–5.2)
Sodium: 137 mmol/L (ref 134–144)

## 2015-02-11 LAB — VITAMIN D 25 HYDROXY (VIT D DEFICIENCY, FRACTURES): Vit D, 25-Hydroxy: 48.5 ng/mL (ref 30.0–100.0)

## 2015-02-11 LAB — LIPID PANEL
CHOL/HDL RATIO: 3 ratio (ref 0.0–4.4)
Cholesterol, Total: 239 mg/dL — ABNORMAL HIGH (ref 100–199)
HDL: 79 mg/dL (ref >39)
LDL Calculated: 144 mg/dL — ABNORMAL HIGH (ref 0–99)
Triglycerides: 81 mg/dL (ref 0–149)
VLDL Cholesterol Cal: 16 mg/dL (ref 5–40)

## 2015-02-12 ENCOUNTER — Telehealth: Payer: Self-pay | Admitting: *Deleted

## 2015-02-12 NOTE — Telephone Encounter (Signed)
-----   Message from Chipper Herb, MD sent at 02/11/2015  9:33 PM EDT ----- There were greater than 2 organisms recovered on the urine culture. If the patient is having symptoms she should resubmit another specimen that is a clean catch midstream specimen for another urine culture. If she is not having symptoms there is no need to resubmit another urine specimen. She should continue to drink plenty of fluids daily.

## 2015-02-15 ENCOUNTER — Other Ambulatory Visit: Payer: Medicare Other

## 2015-02-15 DIAGNOSIS — Z1212 Encounter for screening for malignant neoplasm of rectum: Secondary | ICD-10-CM | POA: Diagnosis not present

## 2015-02-15 NOTE — Progress Notes (Signed)
Lab only 

## 2015-02-17 LAB — FECAL OCCULT BLOOD, IMMUNOCHEMICAL: Fecal Occult Bld: NEGATIVE

## 2015-03-01 ENCOUNTER — Encounter: Payer: Self-pay | Admitting: Family Medicine

## 2015-03-09 DIAGNOSIS — H3532 Exudative age-related macular degeneration: Secondary | ICD-10-CM | POA: Diagnosis not present

## 2015-03-25 DIAGNOSIS — H3532 Exudative age-related macular degeneration: Secondary | ICD-10-CM | POA: Diagnosis not present

## 2015-05-11 DIAGNOSIS — H353212 Exudative age-related macular degeneration, right eye, with inactive choroidal neovascularization: Secondary | ICD-10-CM | POA: Diagnosis not present

## 2015-05-11 DIAGNOSIS — H353222 Exudative age-related macular degeneration, left eye, with inactive choroidal neovascularization: Secondary | ICD-10-CM | POA: Diagnosis not present

## 2015-05-18 ENCOUNTER — Other Ambulatory Visit: Payer: Self-pay | Admitting: Dermatology

## 2015-05-18 DIAGNOSIS — D485 Neoplasm of uncertain behavior of skin: Secondary | ICD-10-CM | POA: Diagnosis not present

## 2015-05-18 DIAGNOSIS — I872 Venous insufficiency (chronic) (peripheral): Secondary | ICD-10-CM | POA: Diagnosis not present

## 2015-05-18 DIAGNOSIS — L821 Other seborrheic keratosis: Secondary | ICD-10-CM | POA: Diagnosis not present

## 2015-05-18 DIAGNOSIS — L300 Nummular dermatitis: Secondary | ICD-10-CM | POA: Diagnosis not present

## 2015-05-31 ENCOUNTER — Encounter: Payer: Self-pay | Admitting: Family Medicine

## 2015-05-31 ENCOUNTER — Ambulatory Visit (INDEPENDENT_AMBULATORY_CARE_PROVIDER_SITE_OTHER): Payer: Medicare Other | Admitting: Family Medicine

## 2015-05-31 ENCOUNTER — Ambulatory Visit (INDEPENDENT_AMBULATORY_CARE_PROVIDER_SITE_OTHER): Payer: Medicare Other

## 2015-05-31 VITALS — BP 123/70 | HR 82 | Temp 96.9°F | Ht 63.0 in | Wt 110.0 lb

## 2015-05-31 DIAGNOSIS — Z23 Encounter for immunization: Secondary | ICD-10-CM

## 2015-05-31 DIAGNOSIS — M25571 Pain in right ankle and joints of right foot: Secondary | ICD-10-CM

## 2015-05-31 DIAGNOSIS — L03115 Cellulitis of right lower limb: Secondary | ICD-10-CM

## 2015-05-31 MED ORDER — CEPHALEXIN 500 MG PO CAPS: 500.0000 mg | ORAL_CAPSULE | Freq: Two times a day (BID) | ORAL | Status: DC

## 2015-05-31 NOTE — Progress Notes (Signed)
Subjective:    Patient ID: Janice Reed, female    DOB: October 22, 1921, 81 y.o.   MRN: [id number]  HPI Patient is here today with Right ankle pain. She states that she hit ankle 3 weeks ago on rocking chair. It just became red and more painful recently.  Review of Systems  Constitutional: Negative.   HENT: Negative.   Eyes: Negative.   Respiratory: Negative.   Cardiovascular: Negative.   Gastrointestinal: Negative.   Endocrine: Negative.   Genitourinary: Negative.   Musculoskeletal:       Right ankle red and swollen   Skin: Positive for color change.       Right ankle Red.  Allergic/Immunologic: Negative.   Neurological: Negative.   Hematological: Negative.   Psychiatric/Behavioral: Negative.         Patient Active Problem List   Diagnosis Date Noted  . Benign essential HTN 05/22/2014  . Age-related macular degeneration, wet, both eyes ([facility] [place]) 03/12/2014  . Internal hemorrhoids without mention of complication 53/97/6734  . Immune fecal occult blood test + x 2 01/05/2012  . Cancer of right breast ([facility]) 09/28/2011  . Hearing loss 12/27/2010  . Arthritis 12/27/2010  . Hypertension   . Hyperlipidemia   . Phlebitis and thrombophlebitis of unspecified site   . Symptomatic menopausal or female climacteric states   . Malaise and fatigue   . Palpitations   . Nonspecific abnormal electrocardiogram (ECG) (EKG)   . Hypothyroidism   . Osteoporosis   . IBS (irritable bowel syndrome)    Outpatient Encounter Prescriptions as of 05/31/2015  Medication Sig  . Aflibercept (EYLEA IO) Inject into the eye every 30 (thirty) days.   [name] Kitchen aspirin 325 MG tablet Take 325 mg by mouth See admin instructions. Take one capsule a day and more if needed for pain - up to 3 times daily  . benazepril (LOTENSIN) 10 MG tablet Take 1 tablet by mouth  every day  . bevacizumab (AVASTIN) 1.25 mg/0.1 mL SOLN 1.25 mg by Intravitreal route every 30 (thirty) days.   . Calcium-Magnesium-Vitamin D (CITRACAL  CALCIUM+D PO) Take 1 capsule by mouth 2 (two) times daily.    . Cholecalciferol (VITAMIN D3) 1000 UNITS CAPS Take 1,000 Units by mouth 2 (two) times daily.   [name] Kitchen levothyroxine (SYNTHROID, LEVOTHROID) 100 MCG tablet TAKE 1 TABLET EVERY DAY  . levothyroxine (SYNTHROID, LEVOTHROID) 25 MCG tablet Take 1 tablet (25 mcg total) by mouth daily before breakfast. As directed  . Multiple Vitamins-Minerals (ICAPS AREDS FORMULA PO) Take 1 capsule by mouth 2 (two) times daily.   . Omega-3 Fatty Acids (FISH OIL) 1000 MG CAPS Take 1,000 mg by mouth 2 (two) times daily.   . ranitidine (ZANTAC) 150 MG tablet Take 150 mg by mouth 2 (two) times daily as needed for heartburn.  . risedronate (ACTONEL) 35 MG tablet Take 1 tablet (35 mg total) by mouth every 7 (seven) days. with water on empty stomach, nothing by mouth or lie down for next 30 minutes.  . triamcinolone cream (KENALOG) 0.1 % APPLY TO AFFECTED AREA EVERY DAY AFTER BATH FOR ITCHY RASH  . triamterene-hydrochlorothiazide (MAXZIDE-25) 37.5-25 MG per tablet Take 1 tablet by mouth  every day as directed   No facility-administered encounter medications on file as of 05/31/2015.       Objective:   Physical Exam  Constitutional: She is oriented to person, place, and time. She appears well-developed and well-nourished. No distress.  The patient looks wonderful for her age of 81 years and  is alert.  HENT:  Head: Normocephalic.  Eyes: Conjunctivae and EOM are normal. Pupils are equal, round, and reactive to light. Right eye exhibits no discharge. Left eye exhibits no discharge. No scleral icterus.  Neck: Normal range of motion.  Musculoskeletal: She exhibits edema and tenderness.  There is tenderness over the right lateral malleolus and some slight swelling with varicosities in both feet. There is rubor and slight redness and erythema to the lateral malleolus  Neurological: She is alert and oriented to person, place, and time.  Skin: Skin is warm and dry. No rash  noted. There is erythema.  Psychiatric: She has a normal mood and affect. Her behavior is normal. Judgment and thought content normal.  Nursing note and vitals reviewed.  BP 123/70 mmHg  Pulse 82  Temp(Src) 96.9 F (36.1 C) (Oral)  Ht 5\' 3"  (1.6 m)  Wt 110 lb (49.896 kg)  BMI 19.49 kg/m2  [facility] reading (PRIMARY) by  Dr.Moore-right ankle-no sign of any acute fracture to the lateral malleolus-await radiology report                                        Assessment & Plan:  1. Cellulitis of right lower extremity -Take antibiotic as directed - cephALEXin (KEFLEX) 500 MG capsule; Take 1 capsule (500 mg total) by mouth 2 (two) times daily.  Dispense: 14 capsule; Refill: 1 - CBC with Differential/Platelet  2. Right ankle pain -We will call with the results of the lab work and the ankle x-rays from the radiologist as soon as they become available - Uric acid - DG Ankle Complete Right; Future  Patient Instructions  Take antibiotic as directed We will call you with the x-ray results and the lab work results as soon as they become available Elevate when possible and use some warm wet compresses 20 minutes 3 or 4 times daily   Arrie Senate MD

## 2015-05-31 NOTE — Patient Instructions (Signed)
Take antibiotic as directed We will call you with the x-ray results and the lab work results as soon as they become available Elevate when possible and use some warm wet compresses 20 minutes 3 or 4 times daily

## 2015-06-01 LAB — URIC ACID: URIC ACID: 5.6 mg/dL (ref 2.5–7.1)

## 2015-06-08 ENCOUNTER — Encounter: Payer: Self-pay | Admitting: Family Medicine

## 2015-06-08 ENCOUNTER — Ambulatory Visit (INDEPENDENT_AMBULATORY_CARE_PROVIDER_SITE_OTHER): Payer: Medicare Other | Admitting: Family Medicine

## 2015-06-08 VITALS — BP 139/80 | HR 92 | Temp 97.5°F | Ht 63.0 in | Wt 113.0 lb

## 2015-06-08 DIAGNOSIS — L03115 Cellulitis of right lower limb: Secondary | ICD-10-CM

## 2015-06-08 DIAGNOSIS — I10 Essential (primary) hypertension: Secondary | ICD-10-CM

## 2015-06-08 DIAGNOSIS — S9001XD Contusion of right ankle, subsequent encounter: Secondary | ICD-10-CM | POA: Diagnosis not present

## 2015-06-08 NOTE — Patient Instructions (Signed)
Continue to try to be careful and did not put yourself at risk for falling and injuring your lower extremities Moves slowly Always keep a check and make sure there is no sign of any infection Continue to watch the ankle that seems to be better

## 2015-06-08 NOTE — Progress Notes (Signed)
Subjective:    Patient ID: Janice Reed, female    DOB: [birth year], 81 y.o.   MRN: [id number]  HPI Patient here today for 8 day follow up on right ankle pain and cellulitis. The patient is doing better. She still noticed a little bit of warmth and sensitivity to the ankle but it is improved.     Patient Active Problem List   Diagnosis Date Noted  . Benign essential HTN 05/22/2014  . Age-related macular degeneration, wet, both eyes ([place]) 03/12/2014  . Internal hemorrhoids without mention of complication 62/13/0865  . Immune fecal occult blood test + x 2 01/05/2012  . Cancer of right breast ([place]) 09/28/2011  . Hearing loss 12/27/2010  . Arthritis 12/27/2010  . Hypertension   . Hyperlipidemia   . Phlebitis and thrombophlebitis of unspecified site   . Symptomatic menopausal or female climacteric states   . Malaise and fatigue   . Palpitations   . Nonspecific abnormal electrocardiogram (ECG) (EKG)   . Hypothyroidism   . Osteoporosis   . IBS (irritable bowel syndrome)    Outpatient Encounter Prescriptions as of 06/08/2015  Medication Sig  . Aflibercept (EYLEA IO) Inject into the eye every 30 (thirty) days.   [name] Kitchen aspirin 325 MG tablet Take 325 mg by mouth See admin instructions. Take one capsule a day and more if needed for pain - up to 3 times daily  . benazepril (LOTENSIN) 10 MG tablet Take 1 tablet by mouth  every day  . bevacizumab (AVASTIN) 1.25 mg/0.1 mL SOLN 1.25 mg by Intravitreal route every 30 (thirty) days.   . Calcium-Magnesium-Vitamin D (CITRACAL CALCIUM+D PO) Take 1 capsule by mouth 2 (two) times daily.    . cephALEXin (KEFLEX) 500 MG capsule Take 1 capsule (500 mg total) by mouth 2 (two) times daily.  . Cholecalciferol (VITAMIN D3) 1000 UNITS CAPS Take 1,000 Units by mouth 2 (two) times daily.   [name] Kitchen levothyroxine (SYNTHROID, LEVOTHROID) 100 MCG tablet TAKE 1 TABLET EVERY DAY  . levothyroxine (SYNTHROID, LEVOTHROID) 25 MCG tablet Take 1 tablet (25 mcg total) by mouth  daily before breakfast. As directed  . Multiple Vitamins-Minerals (ICAPS AREDS FORMULA PO) Take 1 capsule by mouth 2 (two) times daily.   . Omega-3 Fatty Acids (FISH OIL) 1000 MG CAPS Take 1,000 mg by mouth 2 (two) times daily.   . ranitidine (ZANTAC) 150 MG tablet Take 150 mg by mouth 2 (two) times daily as needed for heartburn.  . risedronate (ACTONEL) 35 MG tablet Take 1 tablet (35 mg total) by mouth every 7 (seven) days. with water on empty stomach, nothing by mouth or lie down for next 30 minutes.  . triamcinolone cream (KENALOG) 0.1 % APPLY TO AFFECTED AREA EVERY DAY AFTER BATH FOR ITCHY RASH  . triamterene-hydrochlorothiazide (MAXZIDE-25) 37.5-25 MG per tablet Take 1 tablet by mouth  every day as directed   No facility-administered encounter medications on file as of 06/08/2015.      Review of Systems  Constitutional: Negative.   HENT: Negative.   Eyes: Negative.   Respiratory: Negative.   Cardiovascular: Negative.   Gastrointestinal: Negative.   Endocrine: Negative.   Genitourinary: Negative.   Musculoskeletal: Negative.   Skin: Negative.        Still some redness and slight pain of right ankle  Allergic/Immunologic: Negative.   Neurological: Negative.   Hematological: Negative.   Psychiatric/Behavioral: Negative.        Objective:   Physical Exam  Constitutional: She is oriented to person,  place, and time. No distress.  Thin but elderly and alert  HENT:  Head: Normocephalic.  Eyes: Conjunctivae and EOM are normal. Pupils are equal, round, and reactive to light. Right eye exhibits no discharge. Left eye exhibits no discharge. No scleral icterus.  Neck: Normal range of motion.  Musculoskeletal: Normal range of motion. She exhibits no edema or tenderness.  Less redness and less tenderness of right lateral malleolar area. There is no rubor or warmth palpable.  Neurological: She is alert and oriented to person, place, and time.  Skin: Skin is warm and dry. No rash noted.  No erythema.  Psychiatric: She has a normal mood and affect. Her behavior is normal. Thought content normal.  Nursing note and vitals reviewed.  BP 160/73 mmHg  Pulse 92  Temp(Src) 97.5 F (36.4 C) (Oral)  Ht 5\' 3"  (1.6 m)  Wt 113 lb (51.256 kg)  BMI 20.02 kg/m2  Repeat blood pressure 139/80 left arm sitting regular cuff      Assessment & Plan:  1. Cellulitis of right lower extremity -This is improved and she has finished the antibiotic and no further treatment is necessary  2. Ankle contusion, right, subsequent encounter -No redness and no bruising noticeable today and less tenderness  3. Benign essential HTN -Repeat blood pressure was better  Patient Instructions  Continue to try to be careful and did not put yourself at risk for falling and injuring your lower extremities Moves slowly Always keep a check and make sure there is no sign of any infection Continue to watch the ankle that seems to be better   Arrie Senate MD

## 2015-06-18 ENCOUNTER — Other Ambulatory Visit: Payer: Self-pay | Admitting: Family Medicine

## 2015-06-21 ENCOUNTER — Other Ambulatory Visit: Payer: Self-pay | Admitting: Family Medicine

## 2015-06-21 MED ORDER — TRIAMTERENE-HCTZ 37.5-25 MG PO TABS: ORAL_TABLET | ORAL | Status: DC

## 2015-06-21 MED ORDER — RISEDRONATE SODIUM 35 MG PO TABS: 35.0000 mg | ORAL_TABLET | ORAL | Status: DC

## 2015-06-21 MED ORDER — BENAZEPRIL HCL 10 MG PO TABS: ORAL_TABLET | ORAL | Status: DC

## 2015-06-21 NOTE — Telephone Encounter (Signed)
done

## 2015-06-28 ENCOUNTER — Ambulatory Visit (INDEPENDENT_AMBULATORY_CARE_PROVIDER_SITE_OTHER): Payer: Medicare Other | Admitting: Family Medicine

## 2015-06-28 ENCOUNTER — Encounter: Payer: Self-pay | Admitting: Family Medicine

## 2015-06-28 VITALS — BP 145/75 | HR 79 | Temp 97.0°F | Ht 63.0 in | Wt 115.0 lb

## 2015-06-28 DIAGNOSIS — E785 Hyperlipidemia, unspecified: Secondary | ICD-10-CM | POA: Diagnosis not present

## 2015-06-28 DIAGNOSIS — L03115 Cellulitis of right lower limb: Secondary | ICD-10-CM | POA: Diagnosis not present

## 2015-06-28 DIAGNOSIS — E039 Hypothyroidism, unspecified: Secondary | ICD-10-CM

## 2015-06-28 DIAGNOSIS — E559 Vitamin D deficiency, unspecified: Secondary | ICD-10-CM

## 2015-06-28 DIAGNOSIS — I10 Essential (primary) hypertension: Secondary | ICD-10-CM

## 2015-06-28 MED ORDER — LEVOTHYROXINE SODIUM 25 MCG PO TABS: 25.0000 ug | ORAL_TABLET | Freq: Every day | ORAL | Status: DC

## 2015-06-28 NOTE — Patient Instructions (Addendum)
Medicare Annual Wellness Visit  [facility] and the medical providers at [facility] strive to bring you the best medical care.  In doing so we not only want to address your current medical conditions and concerns but also to detect new conditions early and prevent illness, disease and health-related problems.    Medicare offers a yearly Wellness Visit which allows our clinical staff to assess your need for preventative services including immunizations, lifestyle education, counseling to decrease risk of preventable diseases and screening for fall risk and other medical concerns.    This visit is provided free of charge (no copay) for all Medicare recipients. The clinical pharmacists at [facility] have begun to conduct these Wellness Visits which will also include a thorough review of all your medications.    As you primary medical provider recommend that you make an appointment for your Annual Wellness Visit if you have not done so already this year.  You may set up this appointment before you leave today or you may call back [phone] and schedule an appointment.  Please make sure when you call that you mention that you are scheduling your Annual Wellness Visit with the clinical pharmacist so that the appointment may be made for the proper length of time.    Continue current medications. Continue good therapeutic lifestyle changes which include good diet and exercise. Fall precautions discussed with patient. If an FOBT was given today- please return it to our front desk. If you are over 17 years old - you may need Prevnar 18 or the adult Pneumonia vaccine.  **Flu shots are available--- please call and schedule a FLU-CLINIC appointment**  After your visit with [place] today you will receive a survey in the mail or online from [employer] regarding your care with [place]. Please take a moment to fill this out. Your feedback is very  important to [place] as you can help [place] better understand your patient needs as well as improve your experience and satisfaction. WE CARE ABOUT YOU!!!   The patient will increase her coated aspirin to one twice daily for a couple weeks and then taper back down to 1 daily by 4 weeks from now. She will keep wearing her support hose She will make sure that she takes the aspirin after eating She will bring [place] some blood pressure readings for review in a couple weeks as her blood pressure was slightly elevated today. She will drink plenty of fluids and stay well hydrated this winter and use nasal saline during the day and Mucinex if needed for cough and congestion She will continue to be careful with movement and activity and not put herself at risk for falling by moving slowly and not climbing

## 2015-06-28 NOTE — Progress Notes (Signed)
Subjective:    Patient ID: Janice Reed, female    DOB: Jul 21, 1922, 81 y.o.   MRN: [id number]  HPI Pt here for follow up and management of chronic medical problems which includes hypertension, hypothyroid, and hyperlipidemia. She is taking medications regularly. The patient continues to have some right ankle redness. Otherwise she denies any other symptoms. She is due to get lab work today. The patient is currently drinking to insure daily and we will make sure that she continues to do that as her weight has increased slightly. She says she enjoys this and it makes her feel better. She denies any chest pain shortness of breath trouble swallowing heartburn indigestion and nausea vomiting diarrhea or blood in the stool. She continues to have some tingling in both of her hands and has atrophy of both thenar muscles. We've discussed with her the possibility of her having carpal tunnel syndrome. She is currently not wearing any braces.       Patient Active Problem List   Diagnosis Date Noted  . Benign essential HTN 05/22/2014  . Age-related macular degeneration, wet, both eyes ([place]) 03/12/2014  . Internal hemorrhoids without mention of complication 98/33/8250  . Immune fecal occult blood test + x 2 01/05/2012  . Cancer of right breast ([place]) 09/28/2011  . Hearing loss 12/27/2010  . Arthritis 12/27/2010  . Hypertension   . Hyperlipidemia   . Phlebitis and thrombophlebitis of unspecified site   . Symptomatic menopausal or female climacteric states   . Malaise and fatigue   . Palpitations   . Nonspecific abnormal electrocardiogram (ECG) (EKG)   . Hypothyroidism   . Osteoporosis   . IBS (irritable bowel syndrome)    Outpatient Encounter Prescriptions as of 06/28/2015  Medication Sig  . Aflibercept (EYLEA IO) Inject into the eye every 30 (thirty) days.   [name] Kitchen aspirin 325 MG tablet Take 325 mg by mouth See admin instructions. Take one capsule a day and more if needed for pain - up to 3 times  daily  . benazepril (LOTENSIN) 10 MG tablet Take 1 tablet by mouth  every day  . bevacizumab (AVASTIN) 1.25 mg/0.1 mL SOLN 1.25 mg by Intravitreal route every 30 (thirty) days.   . Calcium-Magnesium-Vitamin D (CITRACAL CALCIUM+D PO) Take 1 capsule by mouth 2 (two) times daily.    . Cholecalciferol (VITAMIN D3) 1000 UNITS CAPS Take 1,000 Units by mouth 2 (two) times daily.   [name] Kitchen levothyroxine (SYNTHROID, LEVOTHROID) 100 MCG tablet TAKE 1 TABLET EVERY DAY  . levothyroxine (SYNTHROID, LEVOTHROID) 25 MCG tablet Take 1 tablet (25 mcg total) by mouth daily before breakfast. As directed  . Multiple Vitamins-Minerals (ICAPS AREDS FORMULA PO) Take 1 capsule by mouth 2 (two) times daily.   . Omega-3 Fatty Acids (FISH OIL) 1000 MG CAPS Take 1,000 mg by mouth 2 (two) times daily.   . ranitidine (ZANTAC) 150 MG tablet Take 150 mg by mouth 2 (two) times daily as needed for heartburn.  . risedronate (ACTONEL) 35 MG tablet Take 1 tablet (35 mg total) by mouth every 7 (seven) days. with water on empty stomach, nothing by mouth or lie down for next 30 minutes.  . triamcinolone cream (KENALOG) 0.1 % APPLY TO AFFECTED AREA EVERY DAY AFTER BATH FOR ITCHY RASH  . triamterene-hydrochlorothiazide (MAXZIDE-25) 37.5-25 MG tablet Take 1 tablet by mouth  every day as directed  . [DISCONTINUED] levothyroxine (SYNTHROID, LEVOTHROID) 100 MCG tablet Take 1 tablet by mouth  every day   No facility-administered encounter medications  on file as of 06/28/2015.      Review of Systems  Constitutional: Negative.   HENT: Negative.   Eyes: Negative.   Respiratory: Negative.   Cardiovascular: Negative.   Gastrointestinal: Negative.   Endocrine: Negative.   Genitourinary: Negative.   Skin: Negative.        Some right ankle redness   Allergic/Immunologic: Negative.   Neurological: Negative.   Hematological: Negative.   Psychiatric/Behavioral: Negative.        Objective:   Physical Exam  Constitutional: She is oriented to  person, place, and time. No distress.  Small framed, elderly but alert.  HENT:  Head: Normocephalic and atraumatic.  Right Ear: External ear normal.  Left Ear: External ear normal.  Nose: Nose normal.  Mouth/Throat: Oropharynx is clear and moist. No oropharyngeal exudate.  Eyes: Conjunctivae and EOM are normal. Pupils are equal, round, and reactive to light. Right eye exhibits no discharge. Left eye exhibits no discharge. No scleral icterus.  She sees the ophthalmologist regularly.  Neck: Normal range of motion. Neck supple. No thyromegaly present.  No bruits or thyromegaly  Cardiovascular: Normal rate, regular rhythm and intact distal pulses.   No murmur heard. Heart is regular at 72/m  Pulmonary/Chest: Effort normal and breath sounds normal. No respiratory distress. She has no wheezes. She has no rales. She exhibits no tenderness.  Clear anteriorly and posteriorly  Abdominal: Soft. Bowel sounds are normal. She exhibits no mass. There is no tenderness. There is no rebound and no guarding.  Then without organ enlargement or bruits are suprapubic tenderness  Musculoskeletal: Normal range of motion. She exhibits no edema or tenderness.  There is slight redness of the right ankle but the patient says this is improved and the support hose have helped some. The patient has a kyphotic posture  Lymphadenopathy:    She has no cervical adenopathy.  Neurological: She is alert and oriented to person, place, and time. She has normal reflexes. No cranial nerve deficit.  Skin: Skin is warm and dry. No rash noted.  Psychiatric: She has a normal mood and affect. Her behavior is normal. Judgment and thought content normal.  Nursing note and vitals reviewed.   BP 145/75 mmHg  Pulse 79  Temp(Src) 97 F (36.1 C) (Oral)  Ht _0  (1.6 m)  Wt 115 lb (52.164 kg)  BMI 20.38 kg/m2       Assessment & Plan:  1. Hypothyroidism, unspecified hypothyroidism type -She will continue current treatment  pending results of lab work - CBC with Differential/Platelet - Thyroid Panel With TSH  2. Hyperlipidemia -Continue with omega-3 fatty acids and diet management - CBC with Differential/Platelet - Lipid panel  3. Vitamin D deficiency -Continue with current treatment pending results of lab work - CBC with Differential/Platelet - VITAMIN D 25 Hydroxy (Vit-D Deficiency, Fractures)  4. Benign essential HTN -The patient will bring some home blood pressure readings by and a couple weeks for review and they will be no change in the treatment today even though her blood pressure was slightly elevated when she arrived. - BMP8+EGFR - CBC with Differential/Platelet - Hepatic function panel  5. Cellulitis of right lower extremity -Increase coated aspirin to one twice daily after eating for a couple weeks and watch stomach carefully to make sure that there is no GI irritation  Patient Instructions                       Medicare Annual Wellness Visit  [facility] and the medical providers at [facility] strive to bring you the best medical care.  In doing so we not only want to address your current medical conditions and concerns but also to detect new conditions early and prevent illness, disease and health-related problems.    Medicare offers a yearly Wellness Visit which allows our clinical staff to assess your need for preventative services including immunizations, lifestyle education, counseling to decrease risk of preventable diseases and screening for fall risk and other medical concerns.    This visit is provided free of charge (no copay) for all Medicare recipients. The clinical pharmacists at [facility] have begun to conduct these Wellness Visits which will also include a thorough review of all your medications.    As you primary medical provider recommend that you make an appointment for your Annual Wellness Visit if you have not done so  already this year.  You may set up this appointment before you leave today or you may call back [phone]) and schedule an appointment.  Please make sure when you call that you mention that you are scheduling your Annual Wellness Visit with the clinical pharmacist so that the appointment may be made for the proper length of time.    Continue current medications. Continue good therapeutic lifestyle changes which include good diet and exercise. Fall precautions discussed with patient. If an FOBT was given today- please return it to our front desk. If you are over 38 years old - you may need Prevnar 87 or the adult Pneumonia vaccine.  **Flu shots are available--- please call and schedule a FLU-CLINIC appointment**  After your visit with [place] today you will receive a survey in the mail or online from [employer] regarding your care with [place]. Please take a moment to fill this out. Your feedback is very important to [place] as you can help [place] better understand your patient needs as well as improve your experience and satisfaction. WE CARE ABOUT YOU!!!   The patient will increase her coated aspirin to one twice daily for a couple weeks and then taper back down to 1 daily by 4 weeks from now. She will keep wearing her support hose She will make sure that she takes the aspirin after eating She will bring [place] some blood pressure readings for review in a couple weeks as her blood pressure was slightly elevated today. She will drink plenty of fluids and stay well hydrated this winter and use nasal saline during the day and Mucinex if needed for cough and congestion She will continue to be careful with movement and activity and not put herself at risk for falling by moving slowly and not climbing   Arrie Senate MD

## 2015-06-29 DIAGNOSIS — H353222 Exudative age-related macular degeneration, left eye, with inactive choroidal neovascularization: Secondary | ICD-10-CM | POA: Diagnosis not present

## 2015-06-29 LAB — THYROID PANEL WITH TSH
Free Thyroxine Index: 3.5 (ref 1.2–4.9)
T3 Uptake Ratio: 30 % (ref 24–39)
T4, Total: 11.7 ug/dL (ref 4.5–12.0)
TSH: 1.25 u[IU]/mL (ref 0.450–4.500)

## 2015-06-29 LAB — HEPATIC FUNCTION PANEL
ALBUMIN: 4 g/dL (ref 3.2–4.6)
ALT: 15 IU/L (ref 0–32)
AST: 20 IU/L (ref 0–40)
Alkaline Phosphatase: 91 IU/L (ref 39–117)
Bilirubin Total: 0.3 mg/dL (ref 0.0–1.2)
Bilirubin, Direct: 0.09 mg/dL (ref 0.00–0.40)
TOTAL PROTEIN: 6.5 g/dL (ref 6.0–8.5)

## 2015-06-29 LAB — BMP8+EGFR
BUN/Creatinine Ratio: 26 (ref 11–26)
BUN: 25 mg/dL (ref 10–36)
CALCIUM: 9.9 mg/dL (ref 8.7–10.3)
CHLORIDE: 96 mmol/L — AB (ref 97–106)
CO2: 29 mmol/L (ref 18–29)
Creatinine, Ser: 0.97 mg/dL (ref 0.57–1.00)
GFR calc non Af Amer: 51 mL/min/{1.73_m2} — ABNORMAL LOW (ref >59)
GFR, EST AFRICAN AMERICAN: 59 mL/min/{1.73_m2} — AB (ref >59)
GLUCOSE: 85 mg/dL (ref 65–99)
POTASSIUM: 4.6 mmol/L (ref 3.5–5.2)
Sodium: 138 mmol/L (ref 136–144)

## 2015-06-29 LAB — CBC WITH DIFFERENTIAL/PLATELET
BASOS ABS: 0.2 10*3/uL (ref 0.0–0.2)
Basos: 3 %
EOS (ABSOLUTE): 0.3 10*3/uL (ref 0.0–0.4)
Eos: 6 %
Hematocrit: 41.1 % (ref 34.0–46.6)
Hemoglobin: 13.8 g/dL (ref 11.1–15.9)
IMMATURE GRANS (ABS): 0 10*3/uL (ref 0.0–0.1)
Immature Granulocytes: 1 %
LYMPHS: 30 %
Lymphocytes Absolute: 1.7 10*3/uL (ref 0.7–3.1)
MCH: 30.3 pg (ref 26.6–33.0)
MCHC: 33.6 g/dL (ref 31.5–35.7)
MCV: 90 fL (ref 79–97)
Monocytes Absolute: 0.5 10*3/uL (ref 0.1–0.9)
Monocytes: 10 %
NEUTROS ABS: 2.7 10*3/uL (ref 1.4–7.0)
Neutrophils: 50 %
PLATELETS: 219 10*3/uL (ref 150–379)
RBC: 4.55 x10E6/uL (ref 3.77–5.28)
RDW: 13.6 % (ref 12.3–15.4)
WBC: 5.4 10*3/uL (ref 3.4–10.8)

## 2015-06-29 LAB — LIPID PANEL
CHOLESTEROL TOTAL: 226 mg/dL — AB (ref 100–199)
Chol/HDL Ratio: 2.7 ratio units (ref 0.0–4.4)
HDL: 83 mg/dL (ref >39)
LDL CALC: 127 mg/dL — AB (ref 0–99)
TRIGLYCERIDES: 82 mg/dL (ref 0–149)
VLDL CHOLESTEROL CAL: 16 mg/dL (ref 5–40)

## 2015-06-29 LAB — VITAMIN D 25 HYDROXY (VIT D DEFICIENCY, FRACTURES): Vit D, 25-Hydroxy: 47.9 ng/mL (ref 30.0–100.0)

## 2015-07-20 DIAGNOSIS — H353212 Exudative age-related macular degeneration, right eye, with inactive choroidal neovascularization: Secondary | ICD-10-CM | POA: Diagnosis not present

## 2015-08-24 DIAGNOSIS — H353212 Exudative age-related macular degeneration, right eye, with inactive choroidal neovascularization: Secondary | ICD-10-CM | POA: Diagnosis not present

## 2015-08-24 DIAGNOSIS — H353222 Exudative age-related macular degeneration, left eye, with inactive choroidal neovascularization: Secondary | ICD-10-CM | POA: Diagnosis not present

## 2015-09-29 ENCOUNTER — Telehealth: Payer: Self-pay | Admitting: Family Medicine

## 2015-09-29 NOTE — Telephone Encounter (Signed)
FYI

## 2015-09-29 NOTE — Telephone Encounter (Signed)
Please tell her about the new doctor that comes next door

## 2015-09-29 NOTE — Telephone Encounter (Signed)
Stp and advised Janice Reed is the new audiologist next door.

## 2015-10-05 DIAGNOSIS — H353212 Exudative age-related macular degeneration, right eye, with inactive choroidal neovascularization: Secondary | ICD-10-CM | POA: Diagnosis not present

## 2015-10-08 ENCOUNTER — Encounter: Payer: Self-pay | Admitting: Pharmacist

## 2015-10-08 ENCOUNTER — Ambulatory Visit (INDEPENDENT_AMBULATORY_CARE_PROVIDER_SITE_OTHER): Payer: Medicare Other | Admitting: Pharmacist

## 2015-10-08 VITALS — BP 134/70 | HR 70 | Ht 61.0 in | Wt 118.0 lb

## 2015-10-08 DIAGNOSIS — Z0000 Encounter for general adult medical examination without abnormal findings: Secondary | ICD-10-CM | POA: Diagnosis not present

## 2015-10-08 MED ORDER — TRIAMTERENE-HCTZ 37.5-25 MG PO TABS: 0.5000 | ORAL_TABLET | Freq: Every day | ORAL | Status: DC

## 2015-10-08 NOTE — Patient Instructions (Addendum)
Janice Reed , Thank you for taking time to come for your Medicare Wellness Visit. I appreciate your ongoing commitment to your health goals. Please review the following plan we discussed and let me know if I can assist you in the future.   These are the goals we discussed: Try to stay as active as you can - Yoga or Chair exercises from handout.  Continue to eat a variety of fruits and vegetables, lean proteins - fish, chicken, [place], beans, eggs and whole grains.  Can try colace as needed to decrease straining with bowel movements.  Can take B Complex vitamin for nerve pain / tingling   This is a list of the screening recommended for you and due dates:  Health Maintenance  Topic Date Due  .    [name] Kitchen Flu Shot  03/07/2016  . DEXA scan (bone density measurement)  11/03/2016  . Tetanus Vaccine  02/04/2017  . Pneumonia vaccines  Completed  *Topic was postponed. The date shown is not the original due date.    Health Maintenance, Female Adopting a healthy lifestyle and getting preventive care can go a long way to promote health and wellness. Talk with your health care provider about what schedule of regular examinations is right for you. This is a good chance for you to check in with your provider about disease prevention and staying healthy. In between checkups, there are plenty of things you can do on your own. Experts have done a lot of research about which lifestyle changes and preventive measures are most likely to keep you healthy. Ask your health care provider for more information. WEIGHT AND DIET  Eat a healthy diet  Be sure to include plenty of vegetables, fruits, low-fat dairy products, and lean protein.  Do not eat a lot of foods high in solid fats, added sugars, or salt.  Get regular exercise. This is one of the most important things you can do for your health.  Most adults should exercise for at least 150 minutes each week. The exercise should increase your heart rate and make you  sweat (moderate-intensity exercise).  Most adults should also do strengthening exercises at least twice a week. This is in addition to the moderate-intensity exercise.  Maintain a healthy weight  Body mass index (BMI) is a measurement that can be used to identify possible weight problems. It estimates body fat based on height and weight. Your health care provider can help determine your BMI and help you achieve or maintain a healthy weight.  For females 3 years of age and older:   A BMI below 18.5 is considered underweight.  A BMI of 18.5 to 24.9 is normal.  A BMI of 25 to 29.9 is considered overweight.  A BMI of 30 and above is considered obese.  Watch levels of cholesterol and blood lipids  You should start having your blood tested for lipids and cholesterol at 80 years of age, then have this test every 5 years.  You may need to have your cholesterol levels checked more often if:  Your lipid or cholesterol levels are high.  You are older than 80 years of age.  You are at high risk for heart disease.  CANCER SCREENING   Lung Cancer  Lung cancer screening is recommended for adults 64-52 years old who are at high risk for lung cancer because of a history of smoking.  A yearly low-dose CT scan of the lungs is recommended for people who:  Currently smoke.  Have  quit within the past 15 years.  Have at least a 30-pack-year history of smoking. A pack year is smoking an average of one pack of cigarettes a day for 1 year.  Yearly screening should continue until it has been 15 years since you quit.  Yearly screening should stop if you develop a health problem that would prevent you from having lung cancer treatment.  Breast Cancer  Practice breast self-awareness. This means understanding how your breasts normally appear and feel.  It also means doing regular breast self-exams. Let your health care provider know about any changes, no matter how small.  If you are in  your 20s or 30s, you should have a clinical breast exam (CBE) by a health care provider every 1-3 years as part of a regular health exam.  If you are 53 or older, have a CBE every year. Also consider having a breast X-ray (mammogram) every year.  If you have a family history of breast cancer, talk to your health care provider about genetic screening.  If you are at high risk for breast cancer, talk to your health care provider about having an MRI and a mammogram every year.  Breast cancer gene (BRCA) assessment is recommended for women who have family members with BRCA-related cancers. BRCA-related cancers include:  Breast.  Ovarian.  Tubal.  Peritoneal cancers.  Results of the assessment will determine the need for genetic counseling and BRCA1 and BRCA2 testing. Cervical Cancer Your health care provider may recommend that you be screened regularly for cancer of the pelvic organs (ovaries, uterus, and vagina). This screening involves a pelvic examination, including checking for microscopic changes to the surface of your cervix (Pap test). You may be encouraged to have this screening done every 3 years, beginning at age 30.  For women ages 77-65, health care providers may recommend pelvic exams and Pap testing every 3 years, or they may recommend the Pap and pelvic exam, combined with testing for human papilloma virus (HPV), every 5 years. Some types of HPV increase your risk of cervical cancer. Testing for HPV may also be done on women of any age with unclear Pap test results.  Other health care providers may not recommend any screening for nonpregnant women who are considered low risk for pelvic cancer and who do not have symptoms. Ask your health care provider if a screening pelvic exam is right for you.  If you have had past treatment for cervical cancer or a condition that could lead to cancer, you need Pap tests and screening for cancer for at least 20 years after your treatment. If  Pap tests have been discontinued, your risk factors (such as having a new sexual partner) need to be reassessed to determine if screening should resume. Some women have medical problems that increase the chance of getting cervical cancer. In these cases, your health care provider may recommend more frequent screening and Pap tests. Colorectal Cancer  This type of cancer can be detected and often prevented.  Routine colorectal cancer screening usually begins at 80 years of age and continues through 80 years of age.  Your health care provider may recommend screening at an earlier age if you have risk factors for colon cancer.  Your health care provider may also recommend using home test kits to check for hidden blood in the stool.  A small camera at the end of a tube can be used to examine your colon directly (sigmoidoscopy or colonoscopy). This is done to check for  the earliest forms of colorectal cancer.  Routine screening usually begins at age 30.  Direct examination of the colon should be repeated every 5-10 years through 80 years of age. However, you may need to be screened more often if early forms of precancerous polyps or small growths are found. Skin Cancer  Check your skin from head to toe regularly.  Tell your health care provider about any new moles or changes in moles, especially if there is a change in a mole's shape or color.  Also tell your health care provider if you have a mole that is larger than the size of a pencil eraser.  Always use sunscreen. Apply sunscreen liberally and repeatedly throughout the day.  Protect yourself by wearing long sleeves, pants, a wide-brimmed hat, and sunglasses whenever you are outside. HEART DISEASE, DIABETES, AND HIGH BLOOD PRESSURE   High blood pressure causes heart disease and increases the risk of stroke. High blood pressure is more likely to develop in:  People who have blood pressure in the high end of the normal range  (130-139/85-89 mm Hg).  People who are overweight or obese.  People who are African American.  If you are 51-58 years of age, have your blood pressure checked every 3-5 years. If you are 18 years of age or older, have your blood pressure checked every year. You should have your blood pressure measured twice--once when you are at a hospital or clinic, and once when you are not at a hospital or clinic. Record the average of the two measurements. To check your blood pressure when you are not at a hospital or clinic, you can use:  An automated blood pressure machine at a pharmacy.  A home blood pressure monitor.  If you are between 22 years and 41 years old, ask your health care provider if you should take aspirin to prevent strokes.  Have regular diabetes screenings. This involves taking a blood sample to check your fasting blood sugar level.  If you are at a normal weight and have a low risk for diabetes, have this test once every three years after 80 years of age.  If you are overweight and have a high risk for diabetes, consider being tested at a younger age or more often. PREVENTING INFECTION  Hepatitis B  If you have a higher risk for hepatitis B, you should be screened for this virus. You are considered at high risk for hepatitis B if:  You were born in a country where hepatitis B is common. Ask your health care provider which countries are considered high risk.  Your parents were born in a high-risk country, and you have not been immunized against hepatitis B (hepatitis B vaccine).  You have HIV or AIDS.  You use needles to inject street drugs.  You live with someone who has hepatitis B.  You have had sex with someone who has hepatitis B.  You get hemodialysis treatment.  You take certain medicines for conditions, including cancer, organ transplantation, and autoimmune conditions. Hepatitis C  Blood testing is recommended for:  Everyone born from [birth year] through  1963.  Anyone with known risk factors for hepatitis C. Sexually transmitted infections (STIs)  You should be screened for sexually transmitted infections (STIs) including gonorrhea and chlamydia if:  You are sexually active and are younger than 80 years of age.  You are older than 80 years of age and your health care provider tells you that you are at risk for this type of  infection.  Your sexual activity has changed since you were last screened and you are at an increased risk for chlamydia or gonorrhea. Ask your health care provider if you are at risk.  If you do not have HIV, but are at risk, it may be recommended that you take a prescription medicine daily to prevent HIV infection. This is called pre-exposure prophylaxis (PrEP). You are considered at risk if:  You are sexually active and do not regularly use condoms or know the HIV status of your partner(s).  You take drugs by injection.  You are sexually active with a partner who has HIV. Talk with your health care provider about whether you are at high risk of being infected with HIV. If you choose to begin PrEP, you should first be tested for HIV. You should then be tested every 3 months for as long as you are taking PrEP.  PREGNANCY   If you are premenopausal and you may become pregnant, ask your health care provider about preconception counseling.  If you may become pregnant, take 400 to 800 micrograms (mcg) of folic acid every day.  If you want to prevent pregnancy, talk to your health care provider about birth control (contraception). OSTEOPOROSIS AND MENOPAUSE   Osteoporosis is a disease in which the bones lose minerals and strength with aging. This can result in serious bone fractures. Your risk for osteoporosis can be identified using a bone density scan.  If you are 63 years of age or older, or if you are at risk for osteoporosis and fractures, ask your health care provider if you should be screened.  Ask your health  care provider whether you should take a calcium or vitamin D supplement to lower your risk for osteoporosis.  Menopause may have certain physical symptoms and risks.  Hormone replacement therapy may reduce some of these symptoms and risks. Talk to your health care provider about whether hormone replacement therapy is right for you.  HOME CARE INSTRUCTIONS   Schedule regular health, dental, and eye exams.  Stay current with your immunizations.   Do not use any tobacco products including cigarettes, chewing tobacco, or electronic cigarettes.  If you are pregnant, do not drink alcohol.  If you are breastfeeding, limit how much and how often you drink alcohol.  Limit alcohol intake to no more than 1 drink per day for nonpregnant women. One drink equals 12 ounces of beer, 5 ounces of wine, or 1 ounces of hard liquor.  Do not use street drugs.  Do not share needles.  Ask your health care provider for help if you need support or information about quitting drugs.  Tell your health care provider if you often feel depressed.  Tell your health care provider if you have ever been abused or do not feel safe at home.   This information is not intended to replace advice given to you by your health care provider. Make sure you discuss any questions you have with your health care provider.   Document Released: 02/06/2011 Document Revised: 08/14/2014 Document Reviewed: 06/25/2013 Elsevier Interactive Patient Education [employer].

## 2015-10-08 NOTE — Progress Notes (Signed)
Patient ID: Janice Reed, female   DOB: [birth year], 82 y.o.   MRN: [id number]    Subjective:   Janice Reed is a 82 y.o. white, female who presents for a Subsequent Medicare Annual Wellness Visit.  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  She is widowed and lives alone but her daughter lives next door.  She is pleasant, alert and oriented today.   Review of Systems  Review of Systems  Constitutional: Negative.   HENT: Positive for hearing loss.   Eyes: Negative.   Respiratory: Negative.   Cardiovascular: Negative.   Gastrointestinal: Negative for constipation.       However patient does report sometimes she strains and this worsens her hemorrhoids.   Genitourinary: Negative.   Musculoskeletal: Positive for back pain (she is seeing chiropractor Dr Andres Labrum and reports improvement in back pain over the last 2 weeks) and joint pain. Negative for myalgias, falls and neck pain.  Skin: Negative.   Neurological: Positive for tingling (reports tingling from time to time in pointer and middle fingers on both hands.  She has a histor of carpel tunnel with suregery several years ago. ).  Endo/Heme/Allergies: Negative.   Psychiatric/Behavioral: Negative.    Current Medications (verified) Outpatient Encounter Prescriptions as of 10/08/2015  Medication Sig  . Aflibercept (EYLEA IO) Inject into the eye every 30 (thirty) days.   [name] Kitchen aspirin 325 MG tablet Take 325 mg by mouth See admin instructions. Take one tablet a day and more if needed for pain - up to 3 times daily  . benazepril (LOTENSIN) 10 MG tablet Take 1 tablet by mouth  every day  . bevacizumab (AVASTIN) 1.25 mg/0.1 mL SOLN 1.25 mg by Intravitreal route every 30 (thirty) days.   . Calcium-Magnesium-Vitamin D (CITRACAL CALCIUM+D PO) Take 1 capsule by mouth 2 (two) times daily.    . Cholecalciferol (VITAMIN D3) 1000 UNITS CAPS Take 1,000 Units by mouth 2 (two) times daily.   [name] Kitchen levothyroxine  (SYNTHROID, LEVOTHROID) 100 MCG tablet TAKE 1 TABLET EVERY DAY  . levothyroxine (SYNTHROID, LEVOTHROID) 25 MCG tablet Take 1 tablet (25 mcg total) by mouth daily before breakfast. As directed (Patient taking differently: Take 12.5 mcg by mouth daily before breakfast. Three days per week)  . Multiple Vitamins-Minerals (ICAPS AREDS FORMULA PO) Take 1 capsule by mouth 2 (two) times daily.   . Omega-3 Fatty Acids (FISH OIL) 1000 MG CAPS Take 1,000 mg by mouth 2 (two) times daily.   . ranitidine (ZANTAC) 150 MG tablet Take 150 mg by mouth 2 (two) times daily as needed for heartburn.  . risedronate (ACTONEL) 35 MG tablet Take 1 tablet (35 mg total) by mouth every 7 (seven) days. with water on empty stomach, nothing by mouth or lie down for next 30 minutes.  . triamcinolone cream (KENALOG) 0.1 % APPLY TO AFFECTED AREA EVERY DAY AFTER BATH FOR ITCHY RASH  . triamterene-hydrochlorothiazide (MAXZIDE-25) 37.5-25 MG tablet Take 0.5 tablets by mouth daily.  . [DISCONTINUED] triamterene-hydrochlorothiazide (MAXZIDE-25) 37.5-25 MG tablet Take 1 tablet by mouth  every day as directed (Patient taking differently: Take 0.5 tablets by mouth daily. Take 1 tablet by mouth  every day as directed)   No facility-administered encounter medications on file as of 10/08/2015.    Allergies (verified) Alendronate sodium; Benicar; Celebrex; Clarithromycin; Sulfa antibiotics; and Penicillins   History: Past Medical History  Diagnosis Date  . Benign hypertensive heart disease   . Other and unspecified hyperlipidemia   .  Prolapse of vaginal walls without mention of uterine prolapse   . Phlebitis and thrombophlebitis of unspecified site   . Symptomatic menopausal or female climacteric states   . Palpitations   . Nonspecific abnormal electrocardiogram (ECG) (EKG)   . Collagenous colitis   . Unspecified hypothyroidism   . Osteoporosis   . IBS (irritable bowel syndrome)   . Arthritis   . Breast cancer ([place])   . Hypertension    . Hearing loss   . Arthritis pain   . Neuropathy, peripheral ([place])   . Diverticulosis of colon   . External hemorrhoid   . Macular degeneration of both eyes   . Cancer of right breast ([place]) 09/28/2011    IDC;  T2, N0 (IHC+);   ER+;  Her-2 neg.,  Right PM,SLN  09/08/08    Past Surgical History  Procedure Laterality Date  . Back surgery    . Vaginal hysterectomy    . Neuroplasty / transposition median nerve at carpal tunnel bilateral    . Breast lumpectomy      per medical history form dated 09/27/09.  . Colonoscopy  12/03/2002    Dr. Silvano Rusk  . Carpal tunnel release      bilateral  . Tonsillectomy and adenoidectomy     Family History  Problem Relation Age of Onset  . Heart disease Mother     Heart failure per medical history form dated 09/27/09.  [name] Kitchen Heart disease Father     Heart attack per medical history form dated 09/27/09.  [name] Kitchen Heart attack Father   . Heart disease Brother     Heart failure per medical history form dated 09/27/09.  . Stroke Brother   . Stroke Brother   . Leukemia Brother   . Colon cancer Neg Hx   . Breast cancer Cousin    Social History   Occupational History  . retired-homemaker    Social History Main Topics  . Smoking status: Never Smoker   . Smokeless tobacco: Never Used  . Alcohol Use: No  . Drug Use: No  . Sexual Activity: No    Do you feel safe at home?  Yes  Dietary issues and exercise activities: Current Exercise Habits:: Exercise is limited by, Limited by:: orthopedic condition(s)  Current Dietary habits:  Drinks Ensure twice a day;  Eating a variety of foods.   Objective:    Today's Vitals   10/08/15 1011  BP: 134/70  Pulse: 70  Height: '5\' 1"'  (1.549 m)  Weight: 118 lb (53.524 kg)  PainSc: 4   PainLoc: Back   Body mass index is 22.31 kg/(m^2).  Activities of Daily Living In your present state of health, do you have any difficulty performing the following activities: 10/08/2015 11/04/2014  Hearing? Tempie Donning  Vision? Y Y    Difficulty concentrating or making decisions? N N  Walking or climbing stairs? Y Y  Dressing or bathing? Y Y  Doing errands, shopping? N N  Preparing Food and eating ? N N  Using the Toilet? N N  In the past six months, have you accidently leaked urine? N N  Do you have problems with loss of bowel control? N N  Managing your Medications? N N  Managing your Finances? N N  Housekeeping or managing your Housekeeping? N N    Are there smokers in your home (other than you)? No   Cardiac Risk Factors include: advanced age (>110mn, >>82women);family history of premature cardiovascular disease;hypertension  Depression Screen PHQ 2/9 Scores  10/08/2015 06/28/2015 06/08/2015 02/10/2015  PHQ - 2 Score 1 0 1 0  PHQ- 9 Score - - - -    Fall Risk Fall Risk  10/08/2015 06/28/2015 06/08/2015 02/10/2015 11/24/2014  Falls in the past year? No No No No No  Risk for fall due to : - - - - -    Cognitive Function: MMSE - Mini Mental State Exam 10/08/2015 11/04/2014  Orientation to time 5 5  Orientation to Place 5 5  Registration 3 3  Attention/ Calculation 5 5  Recall 3 1  Language- name 2 objects 2 2  Language- repeat 1 1  Language- follow 3 step command 3 3  Language- read & follow direction 1 1  Write a sentence 1 1  Copy design 1 1  Total score 30 -    Immunizations and Health Maintenance Immunization History  Administered Date(s) Administered  . Influenza Whole 04/07/2010  . Influenza,inj,Quad PF,36+ Mos 06/04/2013, 05/22/2014, 05/31/2015  . Pneumococcal Conjugate-13 06/04/2013  . Pneumococcal Polysaccharide-23 08/07/1998  . Td 02/05/2007   There are no preventive care reminders to display for this patient.  Patient Care Team: Chipper Herb, MD as PCP - General (Family Medicine) Milus Height, MD as Referring Physician (Ophthalmology) Ralene Muskrat as Physician Assistant (Chiropractic Medicine)  Indicate any recent Medical Services you may have received from other than [facility]  providers in the past year (date may be approximate).    Assessment:    Annual Wellness Visit    Screening Tests Health Maintenance  Topic Date Due  . ZOSTAVAX  11/04/2015 (Originally [birth year]01[birth year]1984)  . INFLUENZA VACCINE  03/07/2016  . DEXA SCAN  11/03/2016  . TETANUS/TDAP  02/04/2017  . PNA vac Low Risk Adult  Completed        Plan:   During the course of the visit Janice Reed was educated and counseled about the following appropriate screening and preventive services:   Vaccines to include Pneumoccal, Influenza, Hepatitis B, Td, Zostavax - recommended vaccines are UTD (she has not had Zostavax but she also has history of shingles)  Colorectal cancer screening - FOBT and colonoscopy both UTD  Cardiovascular disease screening - last EKG was 2009  Diabetes screening - UTD - last FBG was WNL  Bone Denisty / Osteoporosis Screening - patient is compliance with calcium and Actonel.  She is due to have Dexa rechecked around 11/03/16  Mammogram - UTD - Gets at [facility] in [place], [place]  PAP - no longer required  Glaucoma screening /  Eye Exam - UTD - sees Dr Oval Linsey regularly for macular degeneration   Nutrition counseling - Continue to drink Ensure 1 to 2 times daily.  Eat a variety of fruits and vegetables, lean proteins and whole grains  Physical Activity - try to do chair exercise or gentle yoga exercises to maintain flexibility and leg and core strength.  Decreased hearing - offered appt with audiologist.  Patient declined because she is unable to get hearing aid until 06/2016.  Will make appt closer to that time  Advanced Directives - UTD / copy requested.  Try Vitamin B complex once daily for tingling / nerve  Pain  Try Colace 162m qd as needed to prevent straining with BM    Patient Instructions (the written plan) were given to the patient.   ECherre Robins PEn[facility]  10/08/2015

## 2015-10-24 DIAGNOSIS — R404 Transient alteration of awareness: Secondary | ICD-10-CM | POA: Diagnosis not present

## 2015-10-24 DIAGNOSIS — R42 Dizziness and giddiness: Secondary | ICD-10-CM | POA: Diagnosis not present

## 2015-10-24 DIAGNOSIS — Z7982 Long term (current) use of aspirin: Secondary | ICD-10-CM | POA: Diagnosis not present

## 2015-10-24 DIAGNOSIS — R930 Abnormal findings on diagnostic imaging of skull and head, not elsewhere classified: Secondary | ICD-10-CM | POA: Diagnosis not present

## 2015-10-24 DIAGNOSIS — R55 Syncope and collapse: Secondary | ICD-10-CM | POA: Diagnosis not present

## 2015-10-24 DIAGNOSIS — I517 Cardiomegaly: Secondary | ICD-10-CM | POA: Diagnosis not present

## 2015-10-24 DIAGNOSIS — Z79899 Other long term (current) drug therapy: Secondary | ICD-10-CM | POA: Diagnosis not present

## 2015-10-24 DIAGNOSIS — I10 Essential (primary) hypertension: Secondary | ICD-10-CM | POA: Diagnosis not present

## 2015-10-26 DIAGNOSIS — H353222 Exudative age-related macular degeneration, left eye, with inactive choroidal neovascularization: Secondary | ICD-10-CM | POA: Diagnosis not present

## 2015-10-27 ENCOUNTER — Telehealth: Payer: Self-pay | Admitting: Family Medicine

## 2015-10-27 NOTE — Telephone Encounter (Signed)
Patient passed out at church on Sunday and they carried her to [facility] and the only thing they could find was her BP was fluctuating. Patients appointment moved to Monday 3/27 with Laurance Flatten.

## 2015-11-01 ENCOUNTER — Encounter: Payer: Self-pay | Admitting: Family Medicine

## 2015-11-01 ENCOUNTER — Ambulatory Visit (INDEPENDENT_AMBULATORY_CARE_PROVIDER_SITE_OTHER): Payer: Medicare Other | Admitting: Family Medicine

## 2015-11-01 VITALS — BP 119/67 | HR 82 | Temp 98.0°F | Ht 61.0 in | Wt 117.0 lb

## 2015-11-01 DIAGNOSIS — E039 Hypothyroidism, unspecified: Secondary | ICD-10-CM

## 2015-11-01 DIAGNOSIS — M79641 Pain in right hand: Secondary | ICD-10-CM | POA: Diagnosis not present

## 2015-11-01 DIAGNOSIS — M25561 Pain in right knee: Secondary | ICD-10-CM | POA: Diagnosis not present

## 2015-11-01 DIAGNOSIS — M79642 Pain in left hand: Secondary | ICD-10-CM | POA: Diagnosis not present

## 2015-11-01 DIAGNOSIS — E559 Vitamin D deficiency, unspecified: Secondary | ICD-10-CM

## 2015-11-01 DIAGNOSIS — I10 Essential (primary) hypertension: Secondary | ICD-10-CM | POA: Diagnosis not present

## 2015-11-01 DIAGNOSIS — R55 Syncope and collapse: Secondary | ICD-10-CM

## 2015-11-01 DIAGNOSIS — E785 Hyperlipidemia, unspecified: Secondary | ICD-10-CM

## 2015-11-01 DIAGNOSIS — R2681 Unsteadiness on feet: Secondary | ICD-10-CM | POA: Diagnosis not present

## 2015-11-01 NOTE — Progress Notes (Signed)
Subjective:    Patient ID: Janice Reed, female    DOB: 07-08-1922, 82 y.o.   MRN: [id number]  HPI Pt here for follow up and management of chronic medical problems which includes hypertension, hyperlipidemia, and hypothyroid. She is taking medications regularly.The patient had a recent syncopal spell at church on March 19 and went to [facility]. She will get lab work today. We'll try to get a copy of the record from that visit. This information was obtained and all lab work that was checked was within normal limits. The patient indicates that the doctor told her he could not find anything wrong. A CT scan was done and a chest x-ray was done we do not have copies of those results. Since that happen the patient has felt well and has had no further syncopal episodes. She denies any chest pain or shortness of breath. She is not having any trouble with her GI tract including nausea vomiting diarrhea blood in the stool or black tarry bowel movements. She is passing her water without problems. The day the episode happened and she went to the emergency room her blood pressure was actually high and not low.      Patient Active Problem List   Diagnosis Date Noted  . Benign essential HTN 05/22/2014  . Age-related macular degeneration, wet, both eyes ([place]) 03/12/2014  . Internal hemorrhoids without mention of complication 01/00/7121  . Immune fecal occult blood test + x 2 01/05/2012  . Cancer of right breast ([place]) 09/28/2011  . Hearing loss 12/27/2010  . Arthritis 12/27/2010  . Hypertension   . Hyperlipidemia   . Phlebitis and thrombophlebitis of unspecified site   . Symptomatic menopausal or female climacteric states   . Malaise and fatigue   . Palpitations   . Nonspecific abnormal electrocardiogram (ECG) (EKG)   . Hypothyroidism   . Osteoporosis   . IBS (irritable bowel syndrome)    Outpatient Encounter Prescriptions as of 11/01/2015  Medication Sig  . Aflibercept (EYLEA IO) Inject  into the eye every 30 (thirty) days.   [name] Kitchen aspirin 325 MG tablet Take 325 mg by mouth See admin instructions. Take one tablet a day and more if needed for pain - up to 3 times daily  . benazepril (LOTENSIN) 10 MG tablet Take 1 tablet by mouth  every day  . bevacizumab (AVASTIN) 1.25 mg/0.1 mL SOLN 1.25 mg by Intravitreal route every 30 (thirty) days.   . Calcium-Magnesium-Vitamin D (CITRACAL CALCIUM+D PO) Take 1 capsule by mouth 2 (two) times daily.    . Cholecalciferol (VITAMIN D3) 1000 UNITS CAPS Take 1,000 Units by mouth 2 (two) times daily.   [name] Kitchen levothyroxine (SYNTHROID, LEVOTHROID) 100 MCG tablet TAKE 1 TABLET EVERY DAY  . levothyroxine (SYNTHROID, LEVOTHROID) 25 MCG tablet Take 1 tablet (25 mcg total) by mouth daily before breakfast. As directed (Patient taking differently: Take 12.5 mcg by mouth daily before breakfast. Three days per week)  . Multiple Vitamins-Minerals (ICAPS AREDS FORMULA PO) Take 1 capsule by mouth 2 (two) times daily.   . Omega-3 Fatty Acids (FISH OIL) 1000 MG CAPS Take 1,000 mg by mouth 2 (two) times daily.   . ranitidine (ZANTAC) 150 MG tablet Take 150 mg by mouth 2 (two) times daily as needed for heartburn.  . risedronate (ACTONEL) 35 MG tablet Take 1 tablet (35 mg total) by mouth every 7 (seven) days. with water on empty stomach, nothing by mouth or lie down for next 30 minutes.  . triamcinolone cream (  KENALOG) 0.1 % APPLY TO AFFECTED AREA EVERY DAY AFTER BATH FOR ITCHY RASH  . triamterene-hydrochlorothiazide (MAXZIDE-25) 37.5-25 MG tablet Take 0.5 tablets by mouth daily.   No facility-administered encounter medications on file as of 11/01/2015.      Review of Systems  Constitutional: Negative.   HENT: Negative.   Eyes: Negative.   Respiratory: Negative.   Cardiovascular: Negative.   Gastrointestinal: Negative.   Endocrine: Negative.   Genitourinary: Negative.   Musculoskeletal: Negative.   Skin: Negative.   Allergic/Immunologic: Negative.   Neurological:  Negative.  Syncope: recent spell - 10/24/15 went to [facility]   Hematological: Negative.   Psychiatric/Behavioral: Negative.        Objective:   Physical Exam  Constitutional: She is oriented to person, place, and time.  The elderly but alert and calm  HENT:  Head: Normocephalic and atraumatic.  Right Ear: External ear normal.  Left Ear: External ear normal.  Nose: Nose normal.  Mouth/Throat: Oropharynx is clear and moist.  Eyes: Conjunctivae and EOM are normal. Pupils are equal, round, and reactive to light. Right eye exhibits no discharge. Left eye exhibits no discharge. No scleral icterus.  Neck: Normal range of motion. Neck supple. No thyromegaly present.  No bruits or thyromegaly  Cardiovascular: Normal rate, regular rhythm and normal heart sounds.   No murmur heard. Patient had good pedal pulses in the right foot but they were diminished in the left foot. There were good inguinal pulses bilaterally. The heart was regular at 72/m  Pulmonary/Chest: Effort normal and breath sounds normal. No respiratory distress. She has no wheezes. She has no rales. She exhibits no tenderness.  Clear anteriorly and posteriorly  Abdominal: Soft. Bowel sounds are normal. She exhibits no mass. There is no tenderness. There is no rebound and no guarding.  No abdominal tenderness or organ enlargement or inguinal adenopathy  Genitourinary:  Both breasts were checked and no sign of any masses or axillary adenopathy. The previous resection site in the right breast is noted.  Musculoskeletal: Normal range of motion. She exhibits no edema or tenderness.  The patient ambulates using a cane.  Lymphadenopathy:    She has no cervical adenopathy.  Neurological: She is alert and oriented to person, place, and time. She has normal reflexes. No cranial nerve deficit.  Skin: Skin is warm and dry. No rash noted.  Psychiatric: She has a normal mood and affect. Her behavior is normal. Judgment and thought content normal.    Nursing note and vitals reviewed.  BP 119/67 mmHg  Pulse 82  Temp(Src) 98 F (36.7 C) (Oral)  Ht 5' 1" (1.549 m)  Wt 117 lb (53.071 kg)  BMI 22.12 kg/m2        Assessment & Plan:  1. Hypothyroidism, unspecified hypothyroidism type -Continue current treatment pending results of lab work. Current treatment includes 100 g daily plus half of a 25 on Monday Wednesday and Friday according to the patient. - CBC with Differential/Platelet - Thyroid Panel With TSH  2. Hyperlipidemia -Continue aggressive therapeutic lifestyle changes - CBC with Differential/Platelet - Lipid panel  3. Vitamin D deficiency -Continue current vitamin D replacement pending results of the lab work. - CBC with Differential/Platelet - VITAMIN D 25 Hydroxy (Vit-D Deficiency, Fractures)  4. Benign essential HTN -The blood pressure is good today and we will ask the patient to bring readings by about 4 weeks to make sure she is not having any episodes of hypotension. - CBC with Differential/Platelet - BMP8+EGFR - Hepatic function panel  5. Syncope, unspecified syncope type -Continue to be careful with arising from a supine position to a sitting position to a standing position and arising from a sitting position to a standing position. The patient actually got a little bit lightheaded from sitting up on the table today.  6. Right knee pain -Referred to orthopedist and physical therapy - Ambulatory referral to Physical Therapy - Ambulatory referral to Orthopedic Surgery  7. Bilateral hand pain -Referred to hand specialist - Ambulatory referral to Orthopedic Surgery  8. Gait instability - Ambulatory referral to Physical Therapy  Patient Instructions                       Medicare Annual Wellness Visit  [facility] and the medical providers at [facility] strive to bring you the best medical care.  In doing so we not only want to address your current medical conditions and  concerns but also to detect new conditions early and prevent illness, disease and health-related problems.    Medicare offers a yearly Wellness Visit which allows our clinical staff to assess your need for preventative services including immunizations, lifestyle education, counseling to decrease risk of preventable diseases and screening for fall risk and other medical concerns.    This visit is provided free of charge (no copay) for all Medicare recipients. The clinical pharmacists at [facility] have begun to conduct these Wellness Visits which will also include a thorough review of all your medications.    As you primary medical provider recommend that you make an appointment for your Annual Wellness Visit if you have not done so already this year.  You may set up this appointment before you leave today or you may call back [phone]) and schedule an appointment.  Please make sure when you call that you mention that you are scheduling your Annual Wellness Visit with the clinical pharmacist so that the appointment may be made for the proper length of time.     Continue current medications. Continue good therapeutic lifestyle changes which include good diet and exercise. Fall precautions discussed with patient. If an FOBT was given today- please return it to our front desk. If you are over 52 years old - you may need Prevnar 57 or the adult Pneumonia vaccine.  **Flu shots are available--- please call and schedule a FLU-CLINIC appointment**  After your visit with [place] today you will receive a survey in the mail or online from [employer] regarding your care with [place]. Please take a moment to fill this out. Your feedback is very important to [place] as you can help [place] better understand your patient needs as well as improve your experience and satisfaction. WE CARE ABOUT YOU!!!   We will arrange for you to see the hand specialist at [facility] about the pain in your wrist  and your right knee. We can also discuss your need for carpal tunnel testing if you decide to do that Please try to be more careful with arising and standing too quickly as this could cause you to feel more lightheaded and be more apt to pass out Drink plenty of fluids and stay well hydrated If additional medication is needed for pain Tylenol would be appropriate Return blood pressures for review in about 4 weeks after taking blood pressures in the morning and in the evening and especially if you feel lightheaded on any occasion   Arrie Senate MD

## 2015-11-01 NOTE — Patient Instructions (Addendum)
Medicare Annual Wellness Visit  [facility] and the medical providers at [facility] strive to bring you the best medical care.  In doing so we not only want to address your current medical conditions and concerns but also to detect new conditions early and prevent illness, disease and health-related problems.    Medicare offers a yearly Wellness Visit which allows our clinical staff to assess your need for preventative services including immunizations, lifestyle education, counseling to decrease risk of preventable diseases and screening for fall risk and other medical concerns.    This visit is provided free of charge (no copay) for all Medicare recipients. The clinical pharmacists at [facility] have begun to conduct these Wellness Visits which will also include a thorough review of all your medications.    As you primary medical provider recommend that you make an appointment for your Annual Wellness Visit if you have not done so already this year.  You may set up this appointment before you leave today or you may call back [phone] and schedule an appointment.  Please make sure when you call that you mention that you are scheduling your Annual Wellness Visit with the clinical pharmacist so that the appointment may be made for the proper length of time.     Continue current medications. Continue good therapeutic lifestyle changes which include good diet and exercise. Fall precautions discussed with patient. If an FOBT was given today- please return it to our front desk. If you are over 53 years old - you may need Prevnar 85 or the adult Pneumonia vaccine.  **Flu shots are available--- please call and schedule a FLU-CLINIC appointment**  After your visit with [place] today you will receive a survey in the mail or online from [employer] regarding your care with [place]. Please take a moment to fill this out. Your feedback is very  important to [place] as you can help [place] better understand your patient needs as well as improve your experience and satisfaction. WE CARE ABOUT YOU!!!   We will arrange for you to see the hand specialist at [facility] about the pain in your wrist and your right knee. We can also discuss your need for carpal tunnel testing if you decide to do that Please try to be more careful with arising and standing too quickly as this could cause you to feel more lightheaded and be more apt to pass out Drink plenty of fluids and stay well hydrated If additional medication is needed for pain Tylenol would be appropriate Return blood pressures for review in about 4 weeks after taking blood pressures in the morning and in the evening and especially if you feel lightheaded on any occasion

## 2015-11-02 LAB — CBC WITH DIFFERENTIAL/PLATELET
Basophils Absolute: 0.2 10*3/uL (ref 0.0–0.2)
Basos: 2 %
EOS (ABSOLUTE): 0.3 10*3/uL (ref 0.0–0.4)
EOS: 5 %
HEMATOCRIT: 39.9 % (ref 34.0–46.6)
HEMOGLOBIN: 14 g/dL (ref 11.1–15.9)
Immature Grans (Abs): 0 10*3/uL (ref 0.0–0.1)
Immature Granulocytes: 0 %
LYMPHS ABS: 2.3 10*3/uL (ref 0.7–3.1)
Lymphs: 35 %
MCH: 31 pg (ref 26.6–33.0)
MCHC: 35.1 g/dL (ref 31.5–35.7)
MCV: 89 fL (ref 79–97)
MONOS ABS: 0.6 10*3/uL (ref 0.1–0.9)
Monocytes: 9 %
Neutrophils Absolute: 3.2 10*3/uL (ref 1.4–7.0)
Neutrophils: 49 %
Platelets: 226 10*3/uL (ref 150–379)
RBC: 4.51 x10E6/uL (ref 3.77–5.28)
RDW: 14.5 % (ref 12.3–15.4)
WBC: 6.5 10*3/uL (ref 3.4–10.8)

## 2015-11-02 LAB — THYROID PANEL WITH TSH
Free Thyroxine Index: 3.2 (ref 1.2–4.9)
T3 UPTAKE RATIO: 27 % (ref 24–39)
T4, Total: 12 ug/dL (ref 4.5–12.0)
TSH: 1.74 u[IU]/mL (ref 0.450–4.500)

## 2015-11-02 LAB — HEPATIC FUNCTION PANEL
ALBUMIN: 4.2 g/dL (ref 3.2–4.6)
ALT: 15 IU/L (ref 0–32)
AST: 20 IU/L (ref 0–40)
Alkaline Phosphatase: 80 IU/L (ref 39–117)
BILIRUBIN TOTAL: 0.2 mg/dL (ref 0.0–1.2)
Bilirubin, Direct: 0.07 mg/dL (ref 0.00–0.40)
Total Protein: 6.4 g/dL (ref 6.0–8.5)

## 2015-11-02 LAB — BMP8+EGFR
BUN/Creatinine Ratio: 24 (ref 11–26)
BUN: 28 mg/dL (ref 10–36)
CALCIUM: 9.5 mg/dL (ref 8.7–10.3)
CO2: 25 mmol/L (ref 18–29)
Chloride: 97 mmol/L (ref 96–106)
Creatinine, Ser: 1.18 mg/dL — ABNORMAL HIGH (ref 0.57–1.00)
GFR calc Af Amer: 47 mL/min/{1.73_m2} — ABNORMAL LOW (ref >59)
GFR, EST NON AFRICAN AMERICAN: 40 mL/min/{1.73_m2} — AB (ref >59)
Glucose: 102 mg/dL — ABNORMAL HIGH (ref 65–99)
POTASSIUM: 4.7 mmol/L (ref 3.5–5.2)
Sodium: 136 mmol/L (ref 134–144)

## 2015-11-02 LAB — LIPID PANEL
CHOL/HDL RATIO: 3 ratio (ref 0.0–4.4)
Cholesterol, Total: 250 mg/dL — ABNORMAL HIGH (ref 100–199)
HDL: 83 mg/dL (ref >39)
LDL CALC: 152 mg/dL — AB (ref 0–99)
Triglycerides: 77 mg/dL (ref 0–149)
VLDL CHOLESTEROL CAL: 15 mg/dL (ref 5–40)

## 2015-11-02 LAB — VITAMIN D 25 HYDROXY (VIT D DEFICIENCY, FRACTURES): VIT D 25 HYDROXY: 45.9 ng/mL (ref 30.0–100.0)

## 2015-11-09 ENCOUNTER — Ambulatory Visit: Payer: Medicare Other | Attending: Family Medicine | Admitting: Physical Therapy

## 2015-11-09 ENCOUNTER — Encounter: Payer: Self-pay | Admitting: Physical Therapy

## 2015-11-09 DIAGNOSIS — R2681 Unsteadiness on feet: Secondary | ICD-10-CM | POA: Insufficient documentation

## 2015-11-09 DIAGNOSIS — M25561 Pain in right knee: Secondary | ICD-10-CM | POA: Diagnosis not present

## 2015-11-09 NOTE — Therapy (Signed)
[facility] [place], [place], [address] Phone: [phone]   Fax:  [phone]  Physical Therapy Evaluation  Patient Details  Name: Janice Reed MRN: [id number] Date of Birth: 04-10-1922 Referring Provider: Redge Gainer  Encounter Date: 11/09/2015      PT End of Session - 11/09/15 1000    Visit Number 1   Number of Visits 12   Date for PT Re-Evaluation 12/21/15   PT Start Time 1000   PT Stop Time 1036   PT Time Calculation (min) 36 min   Activity Tolerance Patient tolerated treatment well   Behavior During Therapy [facility] for tasks assessed/performed      Past Medical History  Diagnosis Date  . Benign hypertensive heart disease   . Other and unspecified hyperlipidemia   . Prolapse of vaginal walls without mention of uterine prolapse   . Phlebitis and thrombophlebitis of unspecified site   . Symptomatic menopausal or female climacteric states   . Palpitations   . Nonspecific abnormal electrocardiogram (ECG) (EKG)   . Collagenous colitis   . Unspecified hypothyroidism   . Osteoporosis   . IBS (irritable bowel syndrome)   . Arthritis   . Breast cancer ([place])   . Hypertension   . Hearing loss   . Arthritis pain   . Neuropathy, peripheral ([place])   . Diverticulosis of colon   . External hemorrhoid   . Macular degeneration of both eyes   . Cancer of right breast ([place]) 09/28/2011    IDC;  T2, N0 (IHC+);   ER+;  Her-2 neg.,  Right PM,SLN  09/08/08   . Shingles     Past Surgical History  Procedure Laterality Date  . Back surgery    . Vaginal hysterectomy    . Neuroplasty / transposition median nerve at carpal tunnel bilateral    . Breast lumpectomy      per medical history form dated 09/27/09.  . Colonoscopy  12/03/2002    Dr. Silvano Rusk  . Carpal tunnel release      bilateral  . Tonsillectomy and adenoidectomy      There were no vitals filed for this visit.  Visit Diagnosis:  Unsteadiness on feet - Plan: PT  plan of care cert/re-cert  Pain in right knee - Plan: PT plan of care cert/re-cert      Subjective Assessment - 11/09/15 1000    Subjective Patient reports that she has had increased weakness in BLE  and R knee pain since July 2016. She states she has really slowed down since then and wants to get back to her previous level activity. Patient also c/o neck pain and stifness.   Pertinent History scoliosis, osteoporosis.   How long can you walk comfortably? 30 min   Patient Stated Goals return to PLOF   Currently in Pain? Yes   Pain Score 5    Pain Location Knee   Pain Orientation Right   Pain Descriptors / Indicators Sharp;Aching   Pain Type Chronic pain   Pain Radiating Towards into lateral lower leg   Pain Onset More than a month ago   Pain Frequency Intermittent   Aggravating Factors  being on your feet   Pain Relieving Factors rest   Effect of Pain on Daily Activities limits waking            [facility] PT Assessment - 11/09/15 0001    Assessment   Medical Diagnosis R knee pain; gait instability   Referring Provider Redge Gainer  Onset Date/Surgical Date 02/05/15   Next MD Visit July 2017   Precautions   Precautions Fall   Precaution Comments Patient had one episode of passing out two weeks ago which has been attributed to sudden drop in BP   Balance Screen   Has the patient fallen in the past 6 months No   Has the patient had a decrease in activity level because of a fear of falling?  No   Is the patient reluctant to leave their home because of a fear of falling?  No   Home Social worker Private residence   [place] to enter   Entrance Stairs-Number of Steps 1   [place] - single point   Additional Comments uses cane outside the home   Prior Function   Level of Independence Independent  uses SPC in community   Cognition   Overall Cognitive Status Within Functional  Limits for tasks assessed   Observation/Other Assessments   Focus on Therapeutic Outcomes (FOTO)  BERG Balance 41/56   Posture/Postural Control   Posture Comments Patient has significant L thoracic scoliosis resulting in RSB with R knee valgus; Also forward head and rounded posture.   ROM / Strength   AROM / PROM / Strength Strength   Strength   Overall Strength Comments R hip flex and L knee ext 4+/5, B hip ABD/add 5/5 in sitting   Palpation   Palpation comment mild tendeness of R knee joint line   Special Tests    Special Tests --  negative R knee valgus stress   Standardized Balance Assessment   Standardized Balance Assessment Berg Balance Test   Berg Balance Test   Sit to Stand Able to stand  independently using hands   Standing Unsupported Able to stand safely 2 minutes   Sitting with Back Unsupported but Feet Supported on Floor or Stool Able to sit safely and securely 2 minutes   Stand to Sit Sits safely with minimal use of hands   Transfers Able to transfer safely, minor use of hands   Standing Unsupported with Eyes Closed Able to stand 10 seconds safely   Standing Ubsupported with Feet Together Able to place feet together independently and stand 1 minute safely   From Standing, Reach Forward with Outstretched Arm Can reach forward >12 cm safely (5")   From Standing Position, Pick up Object from Floor Able to pick up shoe safely and easily   From Standing Position, Turn to Look Behind Over each Shoulder Turn sideways only but maintains balance   Turn 360 Degrees Able to turn 360 degrees safely but slowly   Standing Unsupported, Alternately Place Feet on Step/Stool Needs assistance to keep from falling or unable to try   Standing Unsupported, One Foot in Front Able to take small step independently and hold 30 seconds  difficulty lifting RLE   Standing on One Leg Tries to lift leg/unable to hold 3 seconds but remains standing independently   Total Score 41                            PT Education - 11/09/15 1550    Education provided Yes   Education Details hep   Person(s) Educated Patient   Methods Demonstration;Explanation;Handout   Comprehension Verbalized understanding;Returned demonstration          PT Short Term Goals - 11/09/15 1557  PT SHORT TERM GOAL #1   Title I with HEP   Time 2   Period Weeks   Status New           PT Long Term Goals - November 23, 2015 1557    PT LONG TERM GOAL #1   Title improved BERG to 46/56 or better   Time 6   Period Weeks   Status New   PT LONG TERM GOAL #2   Title decreased R knee pain by 50% with ADLS   Time 6   Period Weeks   Status New               Plan - 11/23/15 1551    Clinical Impression Statement Patient presents with concerns of weakness in BLE as well as R knee pain. She has increased edema in the R vs. L knee. She is a significant fall risk based on her BERG score of 41/56. Her SPC was adjusted to the correct height.   Pt will benefit from skilled therapeutic intervention in order to improve on the following deficits Abnormal gait;Decreased activity tolerance;Pain;Postural dysfunction;Decreased strength;Increased edema   Rehab Potential Good   PT Frequency 2x / week   PT Duration 6 weeks   PT Treatment/Interventions Electrical Stimulation;Ultrasound;Therapeutic exercise;Manual techniques;Patient/family education;Neuromuscular re-education;Balance training;Vasopneumatic Device   PT Next Visit Plan balance, work on changing COG posterior and standing up straighter, modalities for knee pain prn   PT Home Exercise Plan bridging   Consulted and Agree with Plan of Care Patient          G-Codes - 2015-11-23 1558    Functional Assessment Tool Used BERG Balance Assessment   Functional Limitation Mobility: Walking and moving around   Mobility: Walking and Moving Around Current Status [phone]) At least 40 percent but less than 60 percent impaired, limited or  restricted   Mobility: Walking and Moving Around Goal Status [phone]) At least 40 percent but less than 60 percent impaired, limited or restricted       Problem List Patient Active Problem List   Diagnosis Date Noted  . Benign essential HTN 05/22/2014  . Age-related macular degeneration, wet, both eyes ([place]) 03/12/2014  . Internal hemorrhoids without mention of complication 18/29/9371  . Immune fecal occult blood test + x 2 01/05/2012  . Cancer of right breast ([place]) 09/28/2011  . Hearing loss 12/27/2010  . Arthritis 12/27/2010  . Hypertension   . Hyperlipidemia   . Phlebitis and thrombophlebitis of unspecified site   . Symptomatic menopausal or female climacteric states   . Malaise and fatigue   . Palpitations   . Nonspecific abnormal electrocardiogram (ECG) (EKG)   . Hypothyroidism   . Osteoporosis   . IBS (irritable bowel syndrome)    Madelyn Flavors PT  11/23/2015, 4:02 PM  [facility] [address] [place], [place], [address] Phone: [phone]   Fax:  [phone]  Name: Janice Reed MRN: [id number] Date of Birth: September 28, 1921

## 2015-11-09 NOTE — Patient Instructions (Signed)
Bridge   Lie back, legs bent. Inhale, pressing hips up. Keeping ribs in, lengthen lower back. Exhale, rolling down along spine from top. Repeat 10-30 times. Do __2__ sessions per day.  Madelyn Flavors, PT 11/09/2015 3:50 PM [facility] [place], [place], [address] Phone: [phone]   Fax:  [phone]

## 2015-11-11 ENCOUNTER — Encounter: Payer: Self-pay | Admitting: Physical Therapy

## 2015-11-11 ENCOUNTER — Ambulatory Visit: Payer: Medicare Other | Admitting: Physical Therapy

## 2015-11-11 ENCOUNTER — Telehealth: Payer: Self-pay | Admitting: Family Medicine

## 2015-11-11 DIAGNOSIS — R2681 Unsteadiness on feet: Secondary | ICD-10-CM

## 2015-11-11 DIAGNOSIS — M25561 Pain in right knee: Secondary | ICD-10-CM | POA: Diagnosis not present

## 2015-11-11 NOTE — Telephone Encounter (Signed)
Debbi talked to patient.

## 2015-11-11 NOTE — Therapy (Signed)
[facility] [place], [place], [address] Phone: [phone]   Fax:  [phone]  Physical Therapy Treatment  Patient Details  Name: Janice Reed MRN: [id number] Date of Birth: [birth year] Referring Provider: Redge Gainer  Encounter Date: 11/11/2015      PT End of Session - 11/11/15 1147    Visit Number 2   Number of Visits 12   Date for PT Re-Evaluation 12/21/15   PT Start Time 1119   PT Stop Time 1159   PT Time Calculation (min) 40 min   Activity Tolerance Patient tolerated treatment well   Behavior During Therapy [facility] for tasks assessed/performed      Past Medical History  Diagnosis Date  . Benign hypertensive heart disease   . Other and unspecified hyperlipidemia   . Prolapse of vaginal walls without mention of uterine prolapse   . Phlebitis and thrombophlebitis of unspecified site   . Symptomatic menopausal or female climacteric states   . Palpitations   . Nonspecific abnormal electrocardiogram (ECG) (EKG)   . Collagenous colitis   . Unspecified hypothyroidism   . Osteoporosis   . IBS (irritable bowel syndrome)   . Arthritis   . Breast cancer ([place])   . Hypertension   . Hearing loss   . Arthritis pain   . Neuropathy, peripheral ([place])   . Diverticulosis of colon   . External hemorrhoid   . Macular degeneration of both eyes   . Cancer of right breast ([place]) 09/28/2011    IDC;  T2, N0 (IHC+);   ER+;  Her-2 neg.,  Right PM,SLN  09/08/08   . Shingles     Past Surgical History  Procedure Laterality Date  . Back surgery    . Vaginal hysterectomy    . Neuroplasty / transposition median nerve at carpal tunnel bilateral    . Breast lumpectomy      per medical history form dated 09/27/09.  . Colonoscopy  12/03/2002    Dr. Silvano Rusk  . Carpal tunnel release      bilateral  . Tonsillectomy and adenoidectomy      There were no vitals filed for this visit.  Visit Diagnosis:  Unsteadiness on feet  Pain in  right knee      Subjective Assessment - 11/11/15 1121    Subjective Patient feeling ok today    Pertinent History scoliosis, osteoporosis.   How long can you walk comfortably? 30 min   Patient Stated Goals return to PLOF   Currently in Pain? No/denies                         [facility] Adult PT Treatment/Exercise - 11/11/15 0001    Exercises   Exercises Lumbar;Knee/Hip   Lumbar Exercises: Standing   Other Standing Lumbar Exercises CGA with standing on door for overhead snow angels, x15   Lumbar Exercises: Seated   Other Seated Lumbar Exercises scap retractions 2x10, ball behind back with retraction 3x10, lumbar ext with ball behing back 2x10   Other Seated Lumbar Exercises seated with pink xts for scap retrations, ext, hoizontal abd, back ext 2x10 each   Knee/Hip Exercises: Aerobic   Nustep 69mn UE/LE L1, monitored for progression             Balance Exercises - 11/11/15 1151    Balance Exercises: Standing   Rockerboard UE support  airex balance x430m, the rockerboard x4m91m  PT Short Term Goals - 11/09/15 1557    PT SHORT TERM GOAL #1   Title I with HEP   Time 2   Period Weeks   Status New           PT Long Term Goals - 11/09/15 1557    PT LONG TERM GOAL #1   Title improved BERG to 46/56 or better   Time 6   Period Weeks   Status New   PT LONG TERM GOAL #2   Title decreased R knee pain by 50% with ADLS   Time 6   Period Weeks   Status New               Plan - 11/11/15 1156    Clinical Impression Statement patient tolerated treatment well today with no pain pre or post treatment. Patient has a lot of difficulty with upright posture due to scoliosis. Patient progressing with balance and required CGA/SBA with standing activities for safety. Patient required cues for technique throghout. Goals ongoing due to balance and pain deficits   Pt will benefit from skilled therapeutic intervention in order to improve on the  following deficits Abnormal gait;Decreased activity tolerance;Pain;Postural dysfunction;Decreased strength;Increased edema   Rehab Potential Good   PT Frequency 2x / week   PT Duration 6 weeks   PT Treatment/Interventions Electrical Stimulation;Ultrasound;Therapeutic exercise;Manual techniques;Patient/family education;Neuromuscular re-education;Balance training;Vasopneumatic Device   PT Next Visit Plan balance, work on changing COG posterior and standing up straighter, modalities for knee pain prn   PT Home Exercise Plan review HEP   Consulted and Agree with Plan of Care Patient        Problem List Patient Active Problem List   Diagnosis Date Noted  . Benign essential HTN 05/22/2014  . Age-related macular degeneration, wet, both eyes ([place]) 03/12/2014  . Internal hemorrhoids without mention of complication 59/97/7414  . Immune fecal occult blood test + x 2 01/05/2012  . Cancer of right breast ([place]) 09/28/2011  . Hearing loss 12/27/2010  . Arthritis 12/27/2010  . Hypertension   . Hyperlipidemia   . Phlebitis and thrombophlebitis of unspecified site   . Symptomatic menopausal or female climacteric states   . Malaise and fatigue   . Palpitations   . Nonspecific abnormal electrocardiogram (ECG) (EKG)   . Hypothyroidism   . Osteoporosis   . IBS (irritable bowel syndrome)     Jadelynn Boylan P, PTA 11/11/2015, 12:01 PM  [facility] [place], [place], [address] Phone: [phone]   Fax:  [phone]  Name: Janice Reed MRN: [id number] Date of Birth: 01-Dec-1921

## 2015-11-12 DIAGNOSIS — Z9889 Other specified postprocedural states: Secondary | ICD-10-CM | POA: Diagnosis not present

## 2015-11-12 DIAGNOSIS — Z853 Personal history of malignant neoplasm of breast: Secondary | ICD-10-CM | POA: Diagnosis not present

## 2015-11-12 DIAGNOSIS — Z1231 Encounter for screening mammogram for malignant neoplasm of breast: Secondary | ICD-10-CM | POA: Diagnosis not present

## 2015-11-12 LAB — HM MAMMOGRAPHY

## 2015-11-15 ENCOUNTER — Ambulatory Visit: Payer: Self-pay | Admitting: Family Medicine

## 2015-11-15 ENCOUNTER — Encounter: Payer: Self-pay | Admitting: Physical Therapy

## 2015-11-15 ENCOUNTER — Ambulatory Visit: Payer: Medicare Other | Admitting: Physical Therapy

## 2015-11-15 DIAGNOSIS — R2681 Unsteadiness on feet: Secondary | ICD-10-CM | POA: Diagnosis not present

## 2015-11-15 DIAGNOSIS — M25561 Pain in right knee: Secondary | ICD-10-CM | POA: Diagnosis not present

## 2015-11-15 NOTE — Patient Instructions (Signed)
  PNF Strengthening: Resisted    Standing with resistive band around each hand, bring right arm up and away, thumb back. Then repeat with other arm Repeat _10___ times per set. Do __2__ sets per session. Do _2___ sessions per day.  http://orth.exer.us/918   Copyright  VHI. All rights reserved.  Scapular Retraction: Abduction (Standing)    With arms elevated and elbows bent to 90, pinch shoulder blades together and press arms back. Repeat _10___ times per set. Do ___2_ sets per session. Do _2___ sessions per day.  http://orth.exer.us/950   Copyright  VHI. All rights reserved.

## 2015-11-15 NOTE — Therapy (Signed)
[facility] [place], [place], [address] Phone: [phone]   Fax:  [phone]  Physical Therapy Treatment  Patient Details  Name: Janice Reed MRN: [id number] Date of Birth: [birth year] Referring Provider: Redge Gainer  Encounter Date: 11/15/2015      PT End of Session - 11/15/15 1307    Visit Number 3   Number of Visits 12   Date for PT Re-Evaluation 12/21/15   PT Start Time 1233   PT Stop Time 1313   PT Time Calculation (min) 40 min   Activity Tolerance Patient tolerated treatment well   Behavior During Therapy [facility] for tasks assessed/performed      Past Medical History  Diagnosis Date  . Benign hypertensive heart disease   . Other and unspecified hyperlipidemia   . Prolapse of vaginal walls without mention of uterine prolapse   . Phlebitis and thrombophlebitis of unspecified site   . Symptomatic menopausal or female climacteric states   . Palpitations   . Nonspecific abnormal electrocardiogram (ECG) (EKG)   . Collagenous colitis   . Unspecified hypothyroidism   . Osteoporosis   . IBS (irritable bowel syndrome)   . Arthritis   . Breast cancer ([place])   . Hypertension   . Hearing loss   . Arthritis pain   . Neuropathy, peripheral ([place])   . Diverticulosis of colon   . External hemorrhoid   . Macular degeneration of both eyes   . Cancer of right breast ([place]) 09/28/2011    IDC;  T2, N0 (IHC+);   ER+;  Her-2 neg.,  Right PM,SLN  09/08/08   . Shingles     Past Surgical History  Procedure Laterality Date  . Back surgery    . Vaginal hysterectomy    . Neuroplasty / transposition median nerve at carpal tunnel bilateral    . Breast lumpectomy      per medical history form dated 09/27/09.  . Colonoscopy  12/03/2002    Dr. Silvano Rusk  . Carpal tunnel release      bilateral  . Tonsillectomy and adenoidectomy      There were no vitals filed for this visit.      Subjective Assessment - 11/15/15 1238     Subjective Patient reported no complaints after last treatment   Pertinent History scoliosis, osteoporosis.   How long can you walk comfortably? 30 min   Patient Stated Goals return to PLOF   Currently in Pain? No/denies                         [facility] Adult PT Treatment/Exercise - 11/15/15 0001    Lumbar Exercises: Seated   Other Seated Lumbar Exercises seated 2# D1/D2 2x10 each way   Other Seated Lumbar Exercises seated scap retraction and trunk ext with pink XTS 2x10 each using grey ball behind back for lumbar stabilization   Knee/Hip Exercises: Aerobic   Nustep 99mn L2 monitored for progression   Knee/Hip Exercises: Standing   Forward Step Up Right;2 sets;10 reps;Hand Hold: 2;Step Height: 4"   Knee/Hip Exercises: Seated   Long Arc Quad Strengthening;Right;3 sets;10 reps   Long Arc Quad Weight 3 lbs.             Balance Exercises - 11/15/15 1250    Balance Exercises: Standing   Rockerboard Intermittent UE support  airex 267m then feet close x2m61m rockerboard x4mi51m Other Standing Exercises toes on airex foam for COG x2min5m  PT Education - 11/15/15 1313    Education provided Yes   Education Details HEP   Person(s) Educated Patient   Methods Explanation;Demonstration;Handout   Comprehension Verbalized understanding;Returned demonstration          PT Short Term Goals - 11/15/15 1313    PT SHORT TERM GOAL #1   Title I with HEP   Time 2   Period Weeks   Status Achieved           PT Long Term Goals - 11/09/15 1557    PT LONG TERM GOAL #1   Title improved BERG to 46/56 or better   Time 6   Period Weeks   Status New   PT LONG TERM GOAL #2   Title decreased R knee pain by 50% with ADLS   Time 6   Period Weeks   Status New               Plan - 11/15/15 1313    Clinical Impression Statement Patient progressing with all activities today. She had no complaints today and liked the progression to correct posture and  balance. HEP given today with yellow tband, patient independent. Patient met STG #1 other LTG's ongoing due to balance deficits.   Rehab Potential Good   PT Frequency 2x / week   PT Duration 6 weeks   PT Treatment/Interventions Electrical Stimulation;Ultrasound;Therapeutic exercise;Manual techniques;Patient/family education;Neuromuscular re-education;Balance training;Vasopneumatic Device   PT Next Visit Plan balance, work on changing COG posterior and standing up straighter, modalities for knee pain prn   Consulted and Agree with Plan of Care Patient      Patient will benefit from skilled therapeutic intervention in order to improve the following deficits and impairments:  Abnormal gait, Decreased activity tolerance, Pain, Postural dysfunction, Decreased strength, Increased edema  Visit Diagnosis: Unsteadiness on feet  Pain in right knee     Problem List Patient Active Problem List   Diagnosis Date Noted  . Benign essential HTN 05/22/2014  . Age-related macular degeneration, wet, both eyes ([place]) 03/12/2014  . Internal hemorrhoids without mention of complication 82/50/0370  . Immune fecal occult blood test + x 2 01/05/2012  . Cancer of right breast ([place]) 09/28/2011  . Hearing loss 12/27/2010  . Arthritis 12/27/2010  . Hypertension   . Hyperlipidemia   . Phlebitis and thrombophlebitis of unspecified site   . Symptomatic menopausal or female climacteric states   . Malaise and fatigue   . Palpitations   . Nonspecific abnormal electrocardiogram (ECG) (EKG)   . Hypothyroidism   . Osteoporosis   . IBS (irritable bowel syndrome)     Samadhi Mahurin P, PTA 11/15/2015, 1:22 PM  [facility] [place], [place], [address] Phone: [phone]   Fax:  [phone]  Name: Janice Reed MRN: [id number] Date of Birth: Sep 23, 1921

## 2015-11-18 ENCOUNTER — Ambulatory Visit: Payer: Medicare Other | Admitting: Physical Therapy

## 2015-11-18 ENCOUNTER — Encounter: Payer: Self-pay | Admitting: Physical Therapy

## 2015-11-18 ENCOUNTER — Other Ambulatory Visit: Payer: Self-pay | Admitting: Family Medicine

## 2015-11-18 DIAGNOSIS — R2681 Unsteadiness on feet: Secondary | ICD-10-CM

## 2015-11-18 DIAGNOSIS — M25561 Pain in right knee: Secondary | ICD-10-CM | POA: Diagnosis not present

## 2015-11-18 NOTE — Therapy (Signed)
[facility] [place], [place], [address] Phone: [phone]   Fax:  [phone]  Physical Therapy Treatment  Patient Details  Name: Janice Reed MRN: [id number] Date of Birth: June 27, 1922 Referring Provider: Redge Gainer  Encounter Date: 11/18/2015      PT End of Session - 11/18/15 1247    Visit Number 4   Number of Visits 12   Date for PT Re-Evaluation 12/21/15   PT Start Time 1230   PT Stop Time 1312   PT Time Calculation (min) 42 min   Activity Tolerance Patient tolerated treatment well   Behavior During Therapy [facility] for tasks assessed/performed      Past Medical History  Diagnosis Date  . Benign hypertensive heart disease   . Other and unspecified hyperlipidemia   . Prolapse of vaginal walls without mention of uterine prolapse   . Phlebitis and thrombophlebitis of unspecified site   . Symptomatic menopausal or female climacteric states   . Palpitations   . Nonspecific abnormal electrocardiogram (ECG) (EKG)   . Collagenous colitis   . Unspecified hypothyroidism   . Osteoporosis   . IBS (irritable bowel syndrome)   . Arthritis   . Breast cancer ([place])   . Hypertension   . Hearing loss   . Arthritis pain   . Neuropathy, peripheral ([place])   . Diverticulosis of colon   . External hemorrhoid   . Macular degeneration of both eyes   . Cancer of right breast ([place]) 09/28/2011    IDC;  T2, N0 (IHC+);   ER+;  Her-2 neg.,  Right PM,SLN  09/08/08   . Shingles     Past Surgical History  Procedure Laterality Date  . Back surgery    . Vaginal hysterectomy    . Neuroplasty / transposition median nerve at carpal tunnel bilateral    . Breast lumpectomy      per medical history form dated 09/27/09.  . Colonoscopy  12/03/2002    Dr. Silvano Rusk  . Carpal tunnel release      bilateral  . Tonsillectomy and adenoidectomy      There were no vitals filed for this visit.      Subjective Assessment - 11/18/15 1235    Subjective Patient reported no complaints after last treatment   Pertinent History scoliosis, osteoporosis.   How long can you walk comfortably? 30 min   Patient Stated Goals return to PLOF   Currently in Pain? No/denies            [facility] PT Assessment - 11/18/15 0001    Berg Balance Test   Sit to Stand Able to stand  independently using hands   Standing Unsupported Able to stand safely 2 minutes   Sitting with Back Unsupported but Feet Supported on Floor or Stool Able to sit safely and securely 2 minutes   Stand to Sit Sits safely with minimal use of hands   Transfers Able to transfer safely, minor use of hands   Standing Unsupported with Eyes Closed Able to stand 10 seconds safely   Standing Ubsupported with Feet Together Able to place feet together independently and stand 1 minute safely   From Standing, Reach Forward with Outstretched Arm Can reach forward >12 cm safely (5")   From Standing Position, Pick up Object from Floor Able to pick up shoe safely and easily   From Standing Position, Turn to Look Behind Over each Shoulder Turn sideways only but maintains balance   Turn 360 Degrees  Able to turn 360 degrees safely but slowly   Standing Unsupported, Alternately Place Feet on Step/Stool Able to complete >2 steps/needs minimal assist   Standing Unsupported, One Foot in Front Able to take small step independently and hold 30 seconds   Standing on One Leg Tries to lift leg/unable to hold 3 seconds but remains standing independently   Total Score 42                     [facility] Adult PT Treatment/Exercise - 11/18/15 0001    Lumbar Exercises: Seated   Other Seated Lumbar Exercises seated 2# D1/D2 2x10 each way   Other Seated Lumbar Exercises seated scap retraction and trunk ext with pink XTS 2x10 each using grey ball behind back for lumbar stabilization   Knee/Hip Exercises: Aerobic   Nustep 76mn L3 monitored for progression   Knee/Hip Exercises: Standing   Forward  Step Up Right;2 sets;10 reps;Hand Hold: 2;Step Height: 6"   Knee/Hip Exercises: Seated   Long Arc Quad Strengthening;Right;3 sets;10 reps   Long Arc Quad Weight 3 lbs.             Balance Exercises - 11/18/15 1246    Balance Exercises: Standing   Rockerboard Intermittent UE support  x266m airex, x2 rockerboard   Heel Raises Limitations 2x10   Other Standing Exercises toes on airex foam for COG x2m7m            PT Short Term Goals - 11/15/15 1313    PT SHORT TERM GOAL #1   Title I with HEP   Time 2   Period Weeks   Status Achieved           PT Long Term Goals - 11/18/15 1249    PT LONG TERM GOAL #1   Title improved BERG to 46/56 or better   Time 6   Period Weeks   Status On-going   PT LONG TERM GOAL #2   Title decreased R knee pain by 50% with ADLS   Time 6   Period Weeks   Status On-going  no change per patient 11/18/15               Plan - 11/18/15 1249    Clinical Impression Statement Patient progressing with all activities today and able to perform all exercises with no pain. Patient continues to have forward posture due to scoliosis yet able to perform posture activities with good technique. Patient improved with balance today BERG improved 1 point yet goals ongoing due to balance and knee pain deficits   Rehab Potential Good   PT Frequency 2x / week   PT Duration 6 weeks   PT Treatment/Interventions Electrical Stimulation;Ultrasound;Therapeutic exercise;Manual techniques;Patient/family education;Neuromuscular re-education;Balance training;Vasopneumatic Device   PT Next Visit Plan balance, work on changing COG posterior and standing up straighter, modalities for knee pain prn   Consulted and Agree with Plan of Care Patient      Patient will benefit from skilled therapeutic intervention in order to improve the following deficits and impairments:  Abnormal gait, Decreased activity tolerance, Pain, Postural dysfunction, Decreased strength,  Increased edema  Visit Diagnosis: Unsteadiness on feet  Pain in right knee     Problem List Patient Active Problem List   Diagnosis Date Noted  . Benign essential HTN 05/22/2014  . Age-related macular degeneration, wet, [place]/01/2014  . Internal hemorrhoids without mention of complication 02/09/29/1601 Immune fecal occult blood test + x 2 01/05/2012  .  Cancer of right breast ([place]) 09/28/2011  . Hearing loss 12/27/2010  . Arthritis 12/27/2010  . Hypertension   . Hyperlipidemia   . Phlebitis and thrombophlebitis of unspecified site   . Symptomatic menopausal or female climacteric states   . Malaise and fatigue   . Palpitations   . Nonspecific abnormal electrocardiogram (ECG) (EKG)   . Hypothyroidism   . Osteoporosis   . IBS (irritable bowel syndrome)     Maaz Spiering P, PTA 11/18/2015, 1:12 PM  [facility] [place], [place], [address] Phone: [phone]   Fax:  [phone]  Name: Janice Reed MRN: [id number] Date of Birth: May 12, 1922

## 2015-11-22 ENCOUNTER — Other Ambulatory Visit: Payer: Self-pay | Admitting: Family Medicine

## 2015-11-22 ENCOUNTER — Ambulatory Visit: Payer: Medicare Other | Admitting: Physical Therapy

## 2015-11-22 DIAGNOSIS — R2681 Unsteadiness on feet: Secondary | ICD-10-CM

## 2015-11-22 DIAGNOSIS — M25561 Pain in right knee: Secondary | ICD-10-CM | POA: Diagnosis not present

## 2015-11-22 NOTE — Therapy (Signed)
[facility] [place], [place], [address] Phone: [phone]   Fax:  [phone]  Physical Therapy Treatment  Patient Details  Name: Janice Reed MRN: [id number] Date of Birth: 10-15-1921 Referring Provider: Redge Gainer  Encounter Date: 11/22/2015      PT End of Session - 11/22/15 1131    Visit Number 5   Number of Visits 12   Date for PT Re-Evaluation 12/21/15   PT Start Time 1130   PT Stop Time 1209   PT Time Calculation (min) 39 min   Activity Tolerance Patient tolerated treatment well   Behavior During Therapy [facility] for tasks assessed/performed      Past Medical History  Diagnosis Date  . Benign hypertensive heart disease   . Other and unspecified hyperlipidemia   . Prolapse of vaginal walls without mention of uterine prolapse   . Phlebitis and thrombophlebitis of unspecified site   . Symptomatic menopausal or female climacteric states   . Palpitations   . Nonspecific abnormal electrocardiogram (ECG) (EKG)   . Collagenous colitis   . Unspecified hypothyroidism   . Osteoporosis   . IBS (irritable bowel syndrome)   . Arthritis   . Breast cancer ([place])   . Hypertension   . Hearing loss   . Arthritis pain   . Neuropathy, peripheral ([place])   . Diverticulosis of colon   . External hemorrhoid   . Macular degeneration of both eyes   . Cancer of right breast ([place]) 09/28/2011    IDC;  T2, N0 (IHC+);   ER+;  Her-2 neg.,  Right PM,SLN  09/08/08   . Shingles     Past Surgical History  Procedure Laterality Date  . Back surgery    . Vaginal hysterectomy    . Neuroplasty / transposition median nerve at carpal tunnel bilateral    . Breast lumpectomy      per medical history form dated 09/27/09.  . Colonoscopy  12/03/2002    Dr. Silvano Rusk  . Carpal tunnel release      bilateral  . Tonsillectomy and adenoidectomy      There were no vitals filed for this visit.      Subjective Assessment - 11/22/15 1131    Subjective Patient states she is doing a little bit better. She still feels tired. Her knee is hurting a little worse today. She says she is seeing an orthopedic in a couple of weeks.   Pertinent History scoliosis, osteoporosis.   How long can you walk comfortably? 30 min   Patient Stated Goals return to PLOF   Currently in Pain? Yes   Pain Score 6    Pain Location Knee   Pain Orientation Right   Pain Descriptors / Indicators Sharp;Aching   Pain Type Chronic pain   Pain Onset More than a month ago   Pain Frequency Intermittent   Aggravating Factors  being on her feet   Pain Relieving Factors rest   Effect of Pain on Daily Activities limites walking                         [facility] Adult PT Treatment/Exercise - 11/22/15 0001    Lumbar Exercises: Standing   Other Standing Lumbar Exercises thoracic extension against wall 10 sec hold x 5. (added toes on foam to decrease pressure on toes). Then added in B shoulder abduction and cervical retraction.   Other Standing Lumbar Exercises soleus activation with weight shift onto balls  of feet/no heel lift; then with toes on foam beam for stretch.    Lumbar Exercises: Supine   Other Supine Lumbar Exercises thoracic extension with arms in Y position for stretch;    Other Supine Lumbar Exercises cervical retraction into pillow x 5   Knee/Hip Exercises: Aerobic   Nustep L 5 x 8 min                  PT Short Term Goals - 11/15/15 1313    PT SHORT TERM GOAL #1   Title I with HEP   Time 2   Period Weeks   Status Achieved           PT Long Term Goals - 11/18/15 1249    PT LONG TERM GOAL #1   Title improved BERG to 46/56 or better   Time 6   Period Weeks   Status On-going   PT LONG TERM GOAL #2   Title decreased R knee pain by 50% with ADLS   Time 6   Period Weeks   Status On-going  no change per patient 11/18/15               Plan - 11/22/15 1213    Clinical Impression Statement Patient did well with  treatment today. She has difficulty with thoracic extension in standing vs supine reporting feelings of pitching forward. We worked on activating her soleus muscles to counter this which she did well with. Patient has poor motor control with cervical retraction in standing but is able to perform in supine with VCs. She reports that supine thoracic extension and cervical retraction feels good to her back. Goals are ongoing.   Rehab Potential Good   PT Frequency 2x / week   PT Duration 6 weeks   PT Treatment/Interventions Electrical Stimulation;Ultrasound;Therapeutic exercise;Manual techniques;Patient/family education;Neuromuscular re-education;Balance training;Vasopneumatic Device   PT Next Visit Plan Continue working on thoracic extension in supine and standing as well as soleus activation to counter forward lean.   PT Home Exercise Plan thoracic ext and cerv retraction in supine and church pew for soleus.   Consulted and Agree with Plan of Care Patient      Patient will benefit from skilled therapeutic intervention in order to improve the following deficits and impairments:  Abnormal gait, Decreased activity tolerance, Pain, Postural dysfunction, Decreased strength, Increased edema  Visit Diagnosis: Unsteadiness on feet     Problem List Patient Active Problem List   Diagnosis Date Noted  . Benign essential HTN 05/22/2014  . Age-related macular degeneration, wet, both eyes ([place]) 03/12/2014  . Internal hemorrhoids without mention of complication 40/98/1191  . Immune fecal occult blood test + x 2 01/05/2012  . Cancer of right breast ([place]) 09/28/2011  . Hearing loss 12/27/2010  . Arthritis 12/27/2010  . Hypertension   . Hyperlipidemia   . Phlebitis and thrombophlebitis of unspecified site   . Symptomatic menopausal or female climacteric states   . Malaise and fatigue   . Palpitations   . Nonspecific abnormal electrocardiogram (ECG) (EKG)   . Hypothyroidism   . Osteoporosis   . IBS  (irritable bowel syndrome)     Janice Reed PT  11/22/2015, 12:18 PM  [facility] [place], [place], [address] Phone: [phone]   Fax:  [phone]  Name: Janice Reed MRN: [id number] Date of Birth: 09-07-1921

## 2015-11-23 ENCOUNTER — Encounter: Payer: Self-pay | Admitting: *Deleted

## 2015-11-25 ENCOUNTER — Ambulatory Visit: Payer: Medicare Other | Admitting: Physical Therapy

## 2015-11-25 DIAGNOSIS — M25561 Pain in right knee: Secondary | ICD-10-CM | POA: Diagnosis not present

## 2015-11-25 DIAGNOSIS — R2681 Unsteadiness on feet: Secondary | ICD-10-CM

## 2015-11-25 NOTE — Therapy (Signed)
[facility] [place], [place], [address] Phone: [phone]   Fax:  [phone]  Physical Therapy Treatment  Patient Details  Name: Janice Reed MRN: [id number] Date of Birth: 1922-07-09 Referring Provider: Redge Gainer  Encounter Date: 11/25/2015      PT End of Session - 11/25/15 1205    Visit Number 6   Number of Visits 12   Date for PT Re-Evaluation 12/21/15   PT Start Time 9509  Patient late for treatment.   PT Stop Time 1201   PT Time Calculation (min) 37 min      Past Medical History  Diagnosis Date  . Benign hypertensive heart disease   . Other and unspecified hyperlipidemia   . Prolapse of vaginal walls without mention of uterine prolapse   . Phlebitis and thrombophlebitis of unspecified site   . Symptomatic menopausal or female climacteric states   . Palpitations   . Nonspecific abnormal electrocardiogram (ECG) (EKG)   . Collagenous colitis   . Unspecified hypothyroidism   . Osteoporosis   . IBS (irritable bowel syndrome)   . Arthritis   . Breast cancer ([place])   . Hypertension   . Hearing loss   . Arthritis pain   . Neuropathy, peripheral ([place])   . Diverticulosis of colon   . External hemorrhoid   . Macular degeneration of both eyes   . Cancer of right breast ([place]) 09/28/2011    IDC;  T2, N0 (IHC+);   ER+;  Her-2 neg.,  Right PM,SLN  09/08/08   . Shingles     Past Surgical History  Procedure Laterality Date  . Back surgery    . Vaginal hysterectomy    . Neuroplasty / transposition median nerve at carpal tunnel bilateral    . Breast lumpectomy      per medical history form dated 09/27/09.  . Colonoscopy  12/03/2002    Dr. Silvano Rusk  . Carpal tunnel release      bilateral  . Tonsillectomy and adenoidectomy      There were no vitals filed for this visit.                                 PT Short Term Goals - 11/15/15 1313    PT SHORT TERM GOAL #1   Title I  with HEP   Time 2   Period Weeks   Status Achieved           PT Long Term Goals - 11/18/15 1249    PT LONG TERM GOAL #1   Title improved BERG to 46/56 or better   Time 6   Period Weeks   Status On-going   PT LONG TERM GOAL #2   Title decreased R knee pain by 50% with ADLS   Time 6   Period Weeks   Status On-going  no change per patient 11/18/15             Patient will benefit from skilled therapeutic intervention in order to improve the following deficits and impairments:     Visit Diagnosis: Unsteadiness on feet  Pain in right knee     Problem List Patient Active Problem List   Diagnosis Date Noted  . Benign essential HTN 05/22/2014  . Age-related macular degeneration, wet, both eyes ([place]) 03/12/2014  . Internal hemorrhoids without mention of complication 32/67/1245  . Immune fecal occult blood test + x 2  01/05/2012  . Cancer of right breast ([place]) 09/28/2011  . Hearing loss 12/27/2010  . Arthritis 12/27/2010  . Hypertension   . Hyperlipidemia   . Phlebitis and thrombophlebitis of unspecified site   . Symptomatic menopausal or female climacteric states   . Malaise and fatigue   . Palpitations   . Nonspecific abnormal electrocardiogram (ECG) (EKG)   . Hypothyroidism   . Osteoporosis   . IBS (irritable bowel syndrome)   Treatment:  Nustep level 4 x 11 minutes.  In parallel bars:  Rockerboard x 5 minutes; Airex balance pad with manual balance challenges x 5 minutes; Airex balance beam x 5 minutes.  Green XTS for scapular retraction 2 x fatigue f/b seated thoracic stretch.  Ignace Mandigo, [place] MPT 11/25/2015, 12:15 PM  [facility] [address] [place], [place], [address] Phone: [phone]   Fax:  [phone]  Name: Janice Reed MRN: [id number] Date of Birth: Jan 16, 1922

## 2015-11-29 ENCOUNTER — Encounter: Payer: Self-pay | Admitting: Physical Therapy

## 2015-11-30 ENCOUNTER — Ambulatory Visit: Payer: Medicare Other | Admitting: Physical Therapy

## 2015-11-30 ENCOUNTER — Encounter: Payer: Self-pay | Admitting: Physical Therapy

## 2015-11-30 DIAGNOSIS — M25561 Pain in right knee: Secondary | ICD-10-CM | POA: Diagnosis not present

## 2015-11-30 DIAGNOSIS — R2681 Unsteadiness on feet: Secondary | ICD-10-CM | POA: Diagnosis not present

## 2015-11-30 NOTE — Therapy (Signed)
[facility] [place], [place], [address] Phone: [phone]   Fax:  [phone]  Physical Therapy Treatment  Patient Details  Name: Janice Reed MRN: [id number] Date of Birth: 11-19-1921 Referring Provider: Redge Gainer  Encounter Date: 11/30/2015      PT End of Session - 11/30/15 1526    Visit Number 7   Number of Visits 12   Date for PT Re-Evaluation 12/21/15   PT Start Time 2336   PT Stop Time 1601   PT Time Calculation (min) 38 min   Activity Tolerance Patient tolerated treatment well   Behavior During Therapy [facility] for tasks assessed/performed      Past Medical History  Diagnosis Date  . Benign hypertensive heart disease   . Other and unspecified hyperlipidemia   . Prolapse of vaginal walls without mention of uterine prolapse   . Phlebitis and thrombophlebitis of unspecified site   . Symptomatic menopausal or female climacteric states   . Palpitations   . Nonspecific abnormal electrocardiogram (ECG) (EKG)   . Collagenous colitis   . Unspecified hypothyroidism   . Osteoporosis   . IBS (irritable bowel syndrome)   . Arthritis   . Breast cancer ([place])   . Hypertension   . Hearing loss   . Arthritis pain   . Neuropathy, peripheral ([place])   . Diverticulosis of colon   . External hemorrhoid   . Macular degeneration of both eyes   . Cancer of right breast ([place]) 09/28/2011    IDC;  T2, N0 (IHC+);   ER+;  Her-2 neg.,  Right PM,SLN  09/08/08   . Shingles     Past Surgical History  Procedure Laterality Date  . Back surgery    . Vaginal hysterectomy    . Neuroplasty / transposition median nerve at carpal tunnel bilateral    . Breast lumpectomy      per medical history form dated 09/27/09.  . Colonoscopy  12/03/2002    Dr. Silvano Rusk  . Carpal tunnel release      bilateral  . Tonsillectomy and adenoidectomy      There were no vitals filed for this visit.      Subjective Assessment - 11/30/15 1525    Subjective States that her knee is still giving her trouble but she is to see orthopedic MD 12/10/2015   Pertinent History scoliosis, osteoporosis.   How long can you walk comfortably? 30 min   Patient Stated Goals return to PLOF   Currently in Pain? Other (Comment)  Reports only knee soreness but gave no numerical rating             [facility] PT Assessment - 11/30/15 0001    Assessment   Medical Diagnosis R knee pain; gait instability   Onset Date/Surgical Date 02/05/15   Next MD Visit July 2017   Precautions   Precautions Fall   Precaution Comments Patient had one episode of passing out two weeks ago which has been attributed to sudden drop in BP                     [facility] Adult PT Treatment/Exercise - 11/30/15 0001    Lumbar Exercises: Seated   Other Seated Lumbar Exercises Seated shoulder retractions red theraband x25 reps   Other Seated Lumbar Exercises Seated horizontal abduction red theraband x20 reps   Knee/Hip Exercises: Aerobic   Nustep L4, seat 6 x15 min   Knee/Hip Exercises: Seated   Long Arc [employer]  Strengthening;Both;2 sets;10 reps;Weights   Long Arc Quad Weight 3 lbs.   Ball Squeeze x20 reps   Clamshell with [employer]  x20 reps   Knee/Hip Exercises: Supine   Bridges Limitations x20 reps   Straight Leg Raises Strengthening;Both;2 sets;10 reps                  PT Short Term Goals - 11/15/15 1313    PT SHORT TERM GOAL #1   Title I with HEP   Time 2   Period Weeks   Status Achieved           PT Long Term Goals - 11/18/15 1249    PT LONG TERM GOAL #1   Title improved BERG to 46/56 or better   Time 6   Period Weeks   Status On-going   PT LONG TERM GOAL #2   Title decreased R knee pain by 50% with ADLS   Time 6   Period Weeks   Status On-going  no change per patient 11/18/15               Plan - 11/30/15 1654    Clinical Impression Statement Patient tolerated today's treatment well with no reports of increased pain in  knees. Patient required VC to sit erect during sitting exercises and moderate multimodal cueing for proper exercise technique and corrections. Patient continues to ambulate with SPC and in trunk flexion and R knee valgus. Patient's LT goals remain on-going at this time secondary to continued increased R knee pain and balance deficits. Patient denied any increased knee pain following end of treatment.   Rehab Potential Good   PT Frequency 2x / week   PT Duration 6 weeks   PT Treatment/Interventions Electrical Stimulation;Ultrasound;Therapeutic exercise;Manual techniques;Patient/family education;Neuromuscular re-education;Balance training;Vasopneumatic Device   PT Next Visit Plan Continue working on thoracic extension in supine and standing as well as soleus activation to counter forward lean.   PT Home Exercise Plan thoracic ext and cerv retraction in supine and church pew for soleus.   Consulted and Agree with Plan of Care Patient      Patient will benefit from skilled therapeutic intervention in order to improve the following deficits and impairments:  Abnormal gait, Decreased activity tolerance, Pain, Postural dysfunction, Decreased strength, Increased edema  Visit Diagnosis: Unsteadiness on feet  Pain in right knee     Problem List Patient Active Problem List   Diagnosis Date Noted  . Benign essential HTN 05/22/2014  . Age-related macular degeneration, wet, both eyes ([place]) 03/12/2014  . Internal hemorrhoids without mention of complication 37/16/9678  . Immune fecal occult blood test + x 2 01/05/2012  . Cancer of right breast ([place]) 09/28/2011  . Hearing loss 12/27/2010  . Arthritis 12/27/2010  . Hypertension   . Hyperlipidemia   . Phlebitis and thrombophlebitis of unspecified site   . Symptomatic menopausal or female climacteric states   . Malaise and fatigue   . Palpitations   . Nonspecific abnormal electrocardiogram (ECG) (EKG)   . Hypothyroidism   . Osteoporosis   . IBS  (irritable bowel syndrome)     Wynelle Fanny, PTA 11/30/2015, 5:04 PM  [facility] [place], [place], [address] Phone: [phone]   Fax:  [phone]  Name: Janice Reed MRN: [id number] Date of Birth: 01-21-1922

## 2015-12-02 ENCOUNTER — Ambulatory Visit: Payer: Medicare Other | Admitting: Physical Therapy

## 2015-12-02 DIAGNOSIS — M25561 Pain in right knee: Secondary | ICD-10-CM | POA: Diagnosis not present

## 2015-12-02 DIAGNOSIS — R2681 Unsteadiness on feet: Secondary | ICD-10-CM | POA: Diagnosis not present

## 2015-12-02 NOTE — Therapy (Addendum)
[facility] [address] [place], [place], [address] Phone: [phone]   Fax:  [phone]  Physical Therapy Treatment  Patient Details  Name: Janice Reed MRN: [id number] Date of Birth: 04/07/1922 Referring Provider: Donald Moore  Encounter Date: 12/02/2015    Past Medical History:  Diagnosis Date  . Arthritis   . Arthritis pain   . Benign hypertensive heart disease   . Breast cancer ([facility])   . Cancer of right breast ([facility]) 09/28/2011   IDC;  T2, N0 (IHC+);   ER+;  Her-2 neg.,  Right PM,SLN  09/08/08   . Collagenous colitis   . Diverticulosis of colon   . External hemorrhoid   . Hearing loss   . Hypertension   . IBS (irritable bowel syndrome)   . Macular degeneration of both eyes   . Neuropathy, peripheral ([facility])   . Nonspecific abnormal electrocardiogram (ECG) (EKG)   . Osteoporosis   . Other and unspecified hyperlipidemia   . Palpitations   . Phlebitis and thrombophlebitis of unspecified site   . Prolapse of vaginal walls without mention of uterine prolapse   . Shingles   . Symptomatic menopausal or female climacteric states   . Unspecified hypothyroidism     Past Surgical History:  Procedure Laterality Date  . BACK SURGERY    . BREAST LUMPECTOMY     per medical history form dated 09/27/09.  . CARPAL TUNNEL RELEASE     bilateral  . COLONOSCOPY  12/03/2002   Dr. Carl Gessner  . NEUROPLASTY / TRANSPOSITION MEDIAN NERVE AT CARPAL TUNNEL BILATERAL    . TONSILLECTOMY AND ADENOIDECTOMY    . VAGINAL HYSTERECTOMY      There were no vitals filed for this visit.                                 PT Short Term Goals - 11/15/15 1313      PT SHORT TERM GOAL #1   Title I with HEP   Time 2   Period Weeks   Status Achieved           PT Long Term Goals - 12/20/15 1340      PT LONG TERM GOAL #1   Title improved BERG to 46/56 or better   Time 6   Period Weeks   Status On-going  45/56 BERG  12/20/15     PT LONG TERM GOAL #2   Title decreased R knee pain by 50% with ADLS   Time 6   Period Weeks   Status On-going  3+/10 pain in right knee with ADL's 12/20/15             Patient will benefit from skilled therapeutic intervention in order to improve the following deficits and impairments:  Abnormal gait, Decreased activity tolerance, Pain, Postural dysfunction, Decreased strength, Increased edema  Visit Diagnosis: Unsteadiness on feet  Pain in right knee     Problem List Patient Active Problem List   Diagnosis Date Noted  . Benign essential HTN 05/22/2014  . Age-related macular degeneration, wet, both eyes ([facility]) 03/12/2014  . Internal hemorrhoids without mention of complication 02/23/2012  . Immune fecal occult blood test + x 2 01/05/2012  . Cancer of right breast ([facility]) 09/28/2011  . Hearing loss 12/27/2010  . Arthritis 12/27/2010  . Hyperlipidemia   . Phlebitis and thrombophlebitis of unspecified site   . Symptomatic menopausal or female climacteric states   .   Malaise and fatigue   . Palpitations   . Nonspecific abnormal electrocardiogram (ECG) (EKG)   . Hypothyroidism   . Osteoporosis   . IBS (irritable bowel syndrome)       PT  08/23/2016, 4:03 PM  [facility] [address] [place], [place], [address] Phone: [phone]   Fax:  [phone]  Name: Janice Reed MRN: [id number] Date of Birth: 11/08/1921  PHYSICAL THERAPY DISCHARGE SUMMARY  Visits from Start of Care: 8.  Current functional level related to goals / functional outcomes: See above.   Remaining deficits: Continued right knee pain and loss of balance.   Education / Equipment: HEP. Plan: Patient agrees to discharge.  Patient goals were not met. Patient is being discharged due to meeting the stated rehab goals.  ?????        Chad Applegate MPT    

## 2015-12-06 ENCOUNTER — Telehealth: Payer: Self-pay | Admitting: Family Medicine

## 2015-12-06 NOTE — Telephone Encounter (Signed)
Patient states that her BP has been running low and she would like to see Dr. Laurance Flatten for this. Patient will run readings by the office and appointment scheduled for Wednesday @ 11:30 with Laurance Flatten.

## 2015-12-07 ENCOUNTER — Encounter: Payer: Self-pay | Admitting: *Deleted

## 2015-12-08 ENCOUNTER — Encounter: Payer: Self-pay | Admitting: Family Medicine

## 2015-12-08 ENCOUNTER — Ambulatory Visit (INDEPENDENT_AMBULATORY_CARE_PROVIDER_SITE_OTHER): Payer: Medicare Other | Admitting: Family Medicine

## 2015-12-08 VITALS — BP 138/84 | HR 95 | Temp 97.7°F | Ht 61.0 in | Wt 115.0 lb

## 2015-12-08 DIAGNOSIS — I952 Hypotension due to drugs: Secondary | ICD-10-CM

## 2015-12-08 NOTE — Patient Instructions (Signed)
Continue to take blood pressure medications as she is currently doing and that includes just one half of a fluid pill daily. Please keep an ongoing diarrhea pounds she feels with the ongoing blood pressures that are being recorded at this lower dose of medicine Bring readings by for review in a couple weeks Alternate readings in the left arm and the right arm Continue to watch sodium intake Check weights periodically

## 2015-12-08 NOTE — Progress Notes (Signed)
Subjective:    Patient ID: Janice Reed, female    DOB: 1921/12/12, 82 y.o.   MRN: [id number]  HPI Patient here today for Low BP.The blood pressure by [place] in the office upon arrival today was 145/83. Using her monitor her blood pressure was 147/84. She has held the benazepril 10 mg daily for the last 3 days. She is only taking half daily on the maxzide. She does bring in blood pressures for review and these will be scanned into the record. I only see one that was less than 100 and this was back on April 16 in the afternoon when her blood pressure was 93/48. Otherwise her numbers have been running in the [id number] range systolically and diastolically anywhere in the upper 40s to the low 70s. The patient says that since she is been leaving off some of the medicine that she has felt better. She still relates back to passing out in church. She is not sure what her blood pressure was at that time. She also says that she has days when she is very sleepy in the evening. She has not been checking her blood pressure at that time. Denies any chest pain or shortness of breath.  Patient Active Problem List   Diagnosis Date Noted  . Benign essential HTN 05/22/2014  . Age-related macular degeneration, wet, both eyes ([place]) 03/12/2014  . Internal hemorrhoids without mention of complication [id number]  . Immune fecal occult blood test + x 2 01/05/2012  . Cancer of right breast ([place]) 09/28/2011  . Hearing loss 12/27/2010  . Arthritis 12/27/2010  . Hypertension   . Hyperlipidemia   . Phlebitis and thrombophlebitis of unspecified site   . Symptomatic menopausal or female climacteric states   . Malaise and fatigue   . Palpitations   . Nonspecific abnormal electrocardiogram (ECG) (EKG)   . Hypothyroidism   . Osteoporosis   . IBS (irritable bowel syndrome)    Outpatient Encounter Prescriptions as of 12/08/2015  Medication Sig  . Aflibercept (EYLEA IO) Inject into the eye every 30 (thirty) days.   [name] Kitchen aspirin 325  MG tablet Take 325 mg by mouth See admin instructions. Take one tablet a day and more if needed for pain - up to 3 times daily  . benazepril (LOTENSIN) 10 MG tablet Take 1 tablet by mouth  every day  . bevacizumab (AVASTIN) 1.25 mg/0.1 mL SOLN 1.25 mg by Intravitreal route every 30 (thirty) days.   . Calcium-Magnesium-Vitamin D (CITRACAL CALCIUM+D PO) Take 1 capsule by mouth 2 (two) times daily.    . Cholecalciferol (VITAMIN D3) 1000 UNITS CAPS Take 1,000 Units by mouth 2 (two) times daily.   [name] Kitchen levothyroxine (SYNTHROID, LEVOTHROID) 100 MCG tablet TAKE 1 TABLET EVERY DAY  . levothyroxine (SYNTHROID, LEVOTHROID) 25 MCG tablet Take 1 tablet (25 mcg total) by mouth daily before breakfast. As directed (Patient taking differently: Take 12.5 mcg by mouth daily before breakfast. Three days per week)  . Multiple Vitamins-Minerals (ICAPS AREDS FORMULA PO) Take 1 capsule by mouth 2 (two) times daily.   . Omega-3 Fatty Acids (FISH OIL) 1000 MG CAPS Take 1,000 mg by mouth 2 (two) times daily.   . ranitidine (ZANTAC) 150 MG tablet Take 150 mg by mouth 2 (two) times daily as needed for heartburn.  . risedronate (ACTONEL) 35 MG tablet TAKE 1 TABLET BY MOUTH  EVERY 7 DAYS WITH WATER ON  EMPTY STOMACH, NOTHING BY  MOUTH OR LIE DOWN FOR NEXT  30 MINUTES.  [name] Kitchen  triamterene-hydrochlorothiazide (MAXZIDE-25) 37.5-25 MG tablet Take 0.5 tablets by mouth daily.  [name] Kitchen triamcinolone cream (KENALOG) 0.1 % Reported on 12/08/2015   No facility-administered encounter medications on file as of 12/08/2015.       Review of Systems  Constitutional: Positive for fatigue.  HENT: Negative.   Eyes: Negative.   Respiratory: Negative.   Cardiovascular: Negative.   Gastrointestinal: Negative.   Endocrine: Negative.   Genitourinary: Negative.   Musculoskeletal: Negative.   Skin: Negative.   Allergic/Immunologic: Negative.   Neurological: Positive for weakness.  Hematological: Negative.   Psychiatric/Behavioral: Negative.          Objective:   Physical Exam  Constitutional: She is oriented to person, place, and time. No distress.  Elderly but alert and pleasant  HENT:  Head: Normocephalic and atraumatic.  Nose: Nose normal.  Eyes: Conjunctivae and EOM are normal. Pupils are equal, round, and reactive to light. Right eye exhibits no discharge. Left eye exhibits no discharge. No scleral icterus.  Neck: Normal range of motion. Neck supple. No thyromegaly present.  Cardiovascular: Normal rate, regular rhythm and normal heart sounds.   No murmur heard. Pulmonary/Chest: Effort normal and breath sounds normal. No respiratory distress. She has no wheezes. She has no rales. She exhibits no tenderness.  Musculoskeletal: Normal range of motion. She exhibits edema.  Uses a cane for gait stability  Lymphadenopathy:    She has no cervical adenopathy.  Neurological: She is alert and oriented to person, place, and time.  Skin: Skin is warm and dry. No rash noted.  Psychiatric: She has a normal mood and affect. Her behavior is normal. Judgment and thought content normal.  Nursing note and vitals reviewed.  BP 145/83 mmHg  Pulse 95  Temp(Src) 97.7 F (36.5 C) (Oral)  Ht 5\' 1"  (1.549 m)  Wt 115 lb (52.164 kg)  BMI 21.74 kg/m2        Assessment & Plan:  1. Hypotension due to drugs -Continue to take half of a Maxide daily -Continue to monitor blood pressures periodically at home and correlate symptoms if blood pressure readings are low -Continue to watch sodium intake -Bring readings by 2-3 weeks for review  Patient Instructions  Continue to take blood pressure medications as she is currently doing and that includes just one half of a fluid pill daily. Please keep an ongoing diarrhea pounds she feels with the ongoing blood pressures that are being recorded at this lower dose of medicine Bring readings by for review in a couple weeks Alternate readings in the left arm and the right arm Continue to watch sodium  intake Check weights periodically   Arrie Senate MD

## 2015-12-09 ENCOUNTER — Encounter: Payer: Self-pay | Admitting: Physical Therapy

## 2015-12-10 DIAGNOSIS — M1611 Unilateral primary osteoarthritis, right hip: Secondary | ICD-10-CM | POA: Diagnosis not present

## 2015-12-10 DIAGNOSIS — M1711 Unilateral primary osteoarthritis, right knee: Secondary | ICD-10-CM | POA: Diagnosis not present

## 2015-12-10 DIAGNOSIS — M25562 Pain in left knee: Secondary | ICD-10-CM | POA: Diagnosis not present

## 2015-12-14 DIAGNOSIS — H353212 Exudative age-related macular degeneration, right eye, with inactive choroidal neovascularization: Secondary | ICD-10-CM | POA: Diagnosis not present

## 2015-12-14 DIAGNOSIS — H353222 Exudative age-related macular degeneration, left eye, with inactive choroidal neovascularization: Secondary | ICD-10-CM | POA: Diagnosis not present

## 2015-12-15 ENCOUNTER — Encounter: Payer: Self-pay | Admitting: Physical Therapy

## 2015-12-15 ENCOUNTER — Ambulatory Visit: Payer: Medicare Other | Attending: Family Medicine | Admitting: Physical Therapy

## 2015-12-15 DIAGNOSIS — M25561 Pain in right knee: Secondary | ICD-10-CM | POA: Insufficient documentation

## 2015-12-15 DIAGNOSIS — R2681 Unsteadiness on feet: Secondary | ICD-10-CM

## 2015-12-15 NOTE — Therapy (Signed)
[facility] [place], [place], [address] Phone: [phone]   Fax:  [phone]  Physical Therapy Treatment  Patient Details  Name: Janice Reed MRN: [id number] Date of Birth: [birth year] Referring Provider: Redge Gainer  Encounter Date: 12/15/2015      PT End of Session - 12/15/15 1400    Visit Number 9   Number of Visits 12   Date for PT Re-Evaluation 12/21/15   PT Start Time 3154   PT Stop Time 1437   PT Time Calculation (min) 39 min   Activity Tolerance Patient tolerated treatment well   Behavior During Therapy [facility] for tasks assessed/performed      Past Medical History  Diagnosis Date  . Benign hypertensive heart disease   . Other and unspecified hyperlipidemia   . Prolapse of vaginal walls without mention of uterine prolapse   . Phlebitis and thrombophlebitis of unspecified site   . Symptomatic menopausal or female climacteric states   . Palpitations   . Nonspecific abnormal electrocardiogram (ECG) (EKG)   . Collagenous colitis   . Unspecified hypothyroidism   . Osteoporosis   . IBS (irritable bowel syndrome)   . Arthritis   . Breast cancer ([place])   . Hypertension   . Hearing loss   . Arthritis pain   . Neuropathy, peripheral ([place])   . Diverticulosis of colon   . External hemorrhoid   . Macular degeneration of both eyes   . Cancer of right breast ([place]) 09/28/2011    IDC;  T2, N0 (IHC+);   ER+;  Her-2 neg.,  Right PM,SLN  09/08/08   . Shingles     Past Surgical History  Procedure Laterality Date  . Back surgery    . Vaginal hysterectomy    . Neuroplasty / transposition median nerve at carpal tunnel bilateral    . Breast lumpectomy      per medical history form dated 09/27/09.  . Colonoscopy  12/03/2002    Dr. Silvano Rusk  . Carpal tunnel release      bilateral  . Tonsillectomy and adenoidectomy      There were no vitals filed for this visit.      Subjective Assessment - 12/15/15 1359    Subjective Reports that MD wanted her to try stationary bike for her knees. Reports that she recieved in R knee upon last visit to MD.   Pertinent History scoliosis, osteoporosis.   How long can you walk comfortably? 30 min   Patient Stated Goals return to PLOF   Currently in Pain? No/denies            [facility] PT Assessment - 12/15/15 0001    Assessment   Medical Diagnosis R knee pain; gait instability   Onset Date/Surgical Date 02/05/15   Precautions   Precautions Fall   Precaution Comments Patient had one episode of passing out two weeks ago which has been attributed to sudden drop in BP                     [facility] Adult PT Treatment/Exercise - 12/15/15 0001    Knee/Hip Exercises: Aerobic   Stationary Bike x6 min; patient requested to stop as she is not used to "that kind of exercise"   Knee/Hip Exercises: Standing   Hip Flexion Stengthening;Right;2 sets;10 reps;Knee bent  3#   Hip Abduction Stengthening;Right;2 sets;10 reps;Knee straight;Other (comment)  3#   Forward Step Up Both;2 sets;10 reps;Hand Hold: 2;Step Height: 4"   Other  Standing Knee Exercises Standing R HS curl 3# x20 reps   Knee/Hip Exercises: Seated   Long Arc Quad Strengthening;Right;2 sets;10 reps;Weights   Long Arc Quad Weight 3 lbs.   Sit to Sand 15 reps;without UE support   Knee/Hip Exercises: Supine   Bridges Limitations x20 reps   Straight Leg Raises Strengthening;Both;2 sets;10 reps   Other Supine Knee/Hip Exercises Clamshell red theraband x20 reps             Balance Exercises - 12/15/15 1439    Balance Exercises: Standing   Standing Eyes Opened Narrow base of support (BOS);Foam/compliant surface;Other reps (comment)  x2 min with 2# reaching; x2 min for across midline reaching   Standing Eyes Closed Narrow base of support (BOS);Foam/compliant surface  x2 min with intermittant UE support and CGA with min assist    Tandem Stance Eyes open;Foam/compliant surface;Intermittent upper  extremity support  x2 min   Marching Limitations on airex x1 min   Other Standing Exercises toe taps 6" step x1 min             PT Short Term Goals - 11/15/15 1313    PT SHORT TERM GOAL #1   Title I with HEP   Time 2   Period Weeks   Status Achieved           PT Long Term Goals - 12/02/15 1631    PT LONG TERM GOAL #1   Title improved BERG to 46/56 or better   Time 6   Period Weeks   Status On-going   PT LONG TERM GOAL #2   Title decreased R knee pain by 50% with ADLS   Time 6   Period Weeks   Status On-going               Plan - 12/15/15 1441    Clinical Impression Statement Patient tolerated today's treatment well as she had no complaints of increased pain only of fatigue following today's treatment. Patient started treatment with using stationary bike to which she stated she had no pain in R knee with. Patient only completed six minutes on stationary bike as she stated she had no had that kind of exercise in a while. Patient completed all other therapeutic exercises well without complaint with minimal to moderate multimodal cueing for correct exercise technique and any corrections required. Patient tolerated standing exercises with resistance well only noting fatigue with exercises. Completed balance exercises fairly well with requiring intermittant to one UE support with balance acitivities. Patient required CGA to min assist with exercises such as standing with eyes closed on airex.    Rehab Potential Good   PT Frequency 2x / week   PT Duration 6 weeks   PT Treatment/Interventions Electrical Stimulation;Ultrasound;Therapeutic exercise;Manual techniques;Patient/family education;Neuromuscular re-education;Balance training;Vasopneumatic Device   PT Next Visit Plan Continue use of stationary bike, R knee strengthing and balance per MPT POC.   PT Home Exercise Plan thoracic ext and cerv retraction in supine and church pew for soleus.   Consulted and Agree with Plan  of Care Patient      Patient will benefit from skilled therapeutic intervention in order to improve the following deficits and impairments:  Abnormal gait, Decreased activity tolerance, Pain, Postural dysfunction, Decreased strength, Increased edema  Visit Diagnosis: Unsteadiness on feet  Pain in right knee     Problem List Patient Active Problem List   Diagnosis Date Noted  . Benign essential HTN 05/22/2014  . Age-related macular degeneration, wet, both eyes ([place])  03/12/2014  . Internal hemorrhoids without mention of complication 41/95/4248  . Immune fecal occult blood test + x 2 01/05/2012  . Cancer of right breast ([place]) 09/28/2011  . Hearing loss 12/27/2010  . Arthritis 12/27/2010  . Hyperlipidemia   . Phlebitis and thrombophlebitis of unspecified site   . Symptomatic menopausal or female climacteric states   . Malaise and fatigue   . Palpitations   . Nonspecific abnormal electrocardiogram (ECG) (EKG)   . Hypothyroidism   . Osteoporosis   . IBS (irritable bowel syndrome)     Wynelle Fanny, PTA 12/15/2015, 2:48 PM  [facility] [address] [place], [place], [address] Phone: [phone]   Fax:  [phone]  Name: Janice Reed MRN: [id number] Date of Birth: October 24, 1921

## 2015-12-20 ENCOUNTER — Encounter: Payer: Self-pay | Admitting: Physical Therapy

## 2015-12-20 ENCOUNTER — Ambulatory Visit: Payer: Medicare Other | Admitting: Physical Therapy

## 2015-12-20 DIAGNOSIS — M419 Scoliosis, unspecified: Secondary | ICD-10-CM | POA: Diagnosis not present

## 2015-12-20 DIAGNOSIS — R2681 Unsteadiness on feet: Secondary | ICD-10-CM | POA: Diagnosis not present

## 2015-12-20 DIAGNOSIS — M25561 Pain in right knee: Secondary | ICD-10-CM | POA: Diagnosis not present

## 2015-12-20 NOTE — Therapy (Signed)
[facility] [place], [place], [address] Phone: [phone]   Fax:  [phone]  Physical Therapy Treatment  Patient Details  Name: Janice Reed MRN: [id number] Date of Birth: Aug 01, 1922 Referring Provider: Redge Gainer  Encounter Date: 12/20/2015      PT End of Session - 12/20/15 1334    Visit Number 10   Number of Visits 12   Date for PT Re-Evaluation 12/21/15   PT Start Time 1324   PT Stop Time 1404   PT Time Calculation (min) 40 min   Activity Tolerance Patient tolerated treatment well   Behavior During Therapy [facility] for tasks assessed/performed      Past Medical History  Diagnosis Date  . Benign hypertensive heart disease   . Other and unspecified hyperlipidemia   . Prolapse of vaginal walls without mention of uterine prolapse   . Phlebitis and thrombophlebitis of unspecified site   . Symptomatic menopausal or female climacteric states   . Palpitations   . Nonspecific abnormal electrocardiogram (ECG) (EKG)   . Collagenous colitis   . Unspecified hypothyroidism   . Osteoporosis   . IBS (irritable bowel syndrome)   . Arthritis   . Breast cancer ([place])   . Hypertension   . Hearing loss   . Arthritis pain   . Neuropathy, peripheral ([place])   . Diverticulosis of colon   . External hemorrhoid   . Macular degeneration of both eyes   . Cancer of right breast ([place]) 09/28/2011    IDC;  T2, N0 (IHC+);   ER+;  Her-2 neg.,  Right PM,SLN  09/08/08   . Shingles     Past Surgical History  Procedure Laterality Date  . Back surgery    . Vaginal hysterectomy    . Neuroplasty / transposition median nerve at carpal tunnel bilateral    . Breast lumpectomy      per medical history form dated 09/27/09.  . Colonoscopy  12/03/2002    Dr. Silvano Rusk  . Carpal tunnel release      bilateral  . Tonsillectomy and adenoidectomy      There were no vitals filed for this visit.      Subjective Assessment - 12/20/15 1328    Subjective Patient was a little sore from stationary bike last treatment yet wanted to try it again today   Pertinent History scoliosis, osteoporosis.   How long can you walk comfortably? 30 min   Patient Stated Goals return to PLOF   Currently in Pain? Yes   Pain Score 3    Pain Location Knee   Pain Orientation Right   Pain Descriptors / Indicators Aching   Pain Type Chronic pain   Pain Onset More than a month ago   Pain Frequency Intermittent   Aggravating Factors  prolong activity   Pain Relieving Factors rest            [facility] PT Assessment - 12/20/15 0001    Berg Balance Test   Sit to Stand Able to stand  independently using hands   Standing Unsupported Able to stand safely 2 minutes   Sitting with Back Unsupported but Feet Supported on Floor or Stool Able to sit safely and securely 2 minutes   Stand to Sit Sits safely with minimal use of hands   Transfers Able to transfer safely, minor use of hands   Standing Unsupported with Eyes Closed Able to stand 10 seconds safely   Standing Ubsupported with Feet Together Able to  place feet together independently and stand 1 minute safely   From Standing, Reach Forward with Outstretched Arm Can reach forward >12 cm safely (5")   From Standing Position, Pick up Object from [place] to pick up shoe safely and easily   From Standing Position, Turn to Look Behind Over each Shoulder Looks behind one side only/other side shows less weight shift   Turn 360 Degrees Able to turn 360 degrees safely but slowly   Standing Unsupported, Alternately Place Feet on Step/Stool Able to complete 4 steps without aid or supervision   Standing Unsupported, One Foot in Front Able to plae foot ahead of the other independently and hold 30 seconds   Standing on One Leg Tries to lift leg/unable to hold 3 seconds but remains standing independently   Total Score 45                     [facility] Adult PT Treatment/Exercise - 12/20/15 0001    Knee/Hip  Exercises: Aerobic   Stationary Bike 55mn   Nustep 1774m UE/LE, monitored for progression             Balance Exercises - 12/20/15 1350    Balance Exercises: Standing   Rockerboard Intermittent UE support;Anterior/posterior  74m87m  Step Ups 6 inch;UE support 1  2x10   Partial Tandem Stance 4 reps;Eyes open   Sidestepping 5 reps;Upper extremity support  in parallel bars   Turning Right;Left  x4   Marching Limitations on airex x2 min   Sit to Stand Time x10 with UE support   Other Standing Exercises toe taps 6" step x1 min             PT Short Term Goals - 11/15/15 1313    PT SHORT TERM GOAL #1   Title I with HEP   Time 2   Period Weeks   Status Achieved           PT Long Term Goals - 12/20/15 1340    PT LONG TERM GOAL #1   Title improved BERG to 46/56 or better   Time 6   Period Weeks   Status On-going  45/56 BERG 12/20/15   PT LONG TERM GOAL #2   Title decreased R knee pain by 50% with ADLS   Time 6   Period Weeks   Status On-going  3+/10 pain in right knee with ADL's 12/20/15               Plan - 12/20/15 1341    Clinical Impression Statement Patient continues to progress and has reported less pain overall in knee and has improved with balance. Patient has reported no falls or LOB. Patient goals ongoing due to balance and pain deficits.   Rehab Potential Good   PT Frequency 2x / week   PT Duration 6 weeks   PT Treatment/Interventions Electrical Stimulation;Ultrasound;Therapeutic exercise;Manual techniques;Patient/family education;Neuromuscular re-education;Balance training;Vasopneumatic Device   PT Next Visit Plan Continue use of stationary bike, R knee strengthing and balance per MPT POC.   Consulted and Agree with Plan of Care Patient      Patient will benefit from skilled therapeutic intervention in order to improve the following deficits and impairments:  Abnormal gait, Decreased activity tolerance, Pain, Postural dysfunction,  Decreased strength, Increased edema  Visit Diagnosis: Unsteadiness on feet  Pain in right knee     Problem List Patient Active Problem List   Diagnosis Date Noted  . Benign essential HTN 05/22/2014  .  Age-related macular degeneration, wet, both eyes ([place]) 03/12/2014  . Internal hemorrhoids without mention of complication 55/08/5866  . Immune fecal occult blood test + x 2 01/05/2012  . Cancer of right breast ([place]) 09/28/2011  . Hearing loss 12/27/2010  . Arthritis 12/27/2010  . Hyperlipidemia   . Phlebitis and thrombophlebitis of unspecified site   . Symptomatic menopausal or female climacteric states   . Malaise and fatigue   . Palpitations   . Nonspecific abnormal electrocardiogram (ECG) (EKG)   . Hypothyroidism   . Osteoporosis   . IBS (irritable bowel syndrome)     DUNFORD, CHRISTINA P, PTA 12/20/2015, 2:04 PM   Ladean Raya, PTA 12/20/2015 2:04 [facility] [place], [place], [address] Phone: [phone]   Fax:  [phone]  Name: MC HOLLEN MRN: [id number] Date of Birth: 1922-04-10

## 2015-12-23 ENCOUNTER — Encounter: Payer: Self-pay | Admitting: *Deleted

## 2016-01-02 ENCOUNTER — Other Ambulatory Visit: Payer: Self-pay | Admitting: Family Medicine

## 2016-01-02 MED ORDER — VALACYCLOVIR HCL 1 G PO TABS: 1000.0000 mg | ORAL_TABLET | Freq: Three times a day (TID) | ORAL | Status: DC

## 2016-01-12 DIAGNOSIS — G5602 Carpal tunnel syndrome, left upper limb: Secondary | ICD-10-CM | POA: Diagnosis not present

## 2016-01-12 DIAGNOSIS — M25531 Pain in right wrist: Secondary | ICD-10-CM | POA: Diagnosis not present

## 2016-01-12 DIAGNOSIS — M19031 Primary osteoarthritis, right wrist: Secondary | ICD-10-CM | POA: Diagnosis not present

## 2016-01-12 DIAGNOSIS — M25532 Pain in left wrist: Secondary | ICD-10-CM | POA: Diagnosis not present

## 2016-01-12 DIAGNOSIS — G5601 Carpal tunnel syndrome, right upper limb: Secondary | ICD-10-CM | POA: Diagnosis not present

## 2016-01-12 DIAGNOSIS — G5603 Carpal tunnel syndrome, bilateral upper limbs: Secondary | ICD-10-CM | POA: Diagnosis not present

## 2016-01-20 DIAGNOSIS — H353212 Exudative age-related macular degeneration, right eye, with inactive choroidal neovascularization: Secondary | ICD-10-CM | POA: Diagnosis not present

## 2016-01-20 DIAGNOSIS — H353222 Exudative age-related macular degeneration, left eye, with inactive choroidal neovascularization: Secondary | ICD-10-CM | POA: Diagnosis not present

## 2016-01-21 DIAGNOSIS — M1711 Unilateral primary osteoarthritis, right knee: Secondary | ICD-10-CM | POA: Diagnosis not present

## 2016-02-02 ENCOUNTER — Ambulatory Visit: Payer: Medicare Other | Admitting: Family Medicine

## 2016-02-03 ENCOUNTER — Ambulatory Visit (INDEPENDENT_AMBULATORY_CARE_PROVIDER_SITE_OTHER): Payer: Medicare Other | Admitting: Family Medicine

## 2016-02-03 ENCOUNTER — Encounter: Payer: Self-pay | Admitting: Family Medicine

## 2016-02-03 ENCOUNTER — Ambulatory Visit (INDEPENDENT_AMBULATORY_CARE_PROVIDER_SITE_OTHER): Payer: Medicare Other

## 2016-02-03 VITALS — BP 148/88 | HR 88 | Temp 97.7°F | Ht 61.0 in | Wt 115.0 lb

## 2016-02-03 DIAGNOSIS — R2681 Unsteadiness on feet: Secondary | ICD-10-CM

## 2016-02-03 DIAGNOSIS — B029 Zoster without complications: Secondary | ICD-10-CM | POA: Diagnosis not present

## 2016-02-03 DIAGNOSIS — E039 Hypothyroidism, unspecified: Secondary | ICD-10-CM

## 2016-02-03 DIAGNOSIS — E785 Hyperlipidemia, unspecified: Secondary | ICD-10-CM | POA: Diagnosis not present

## 2016-02-03 DIAGNOSIS — I10 Essential (primary) hypertension: Secondary | ICD-10-CM

## 2016-02-03 DIAGNOSIS — E559 Vitamin D deficiency, unspecified: Secondary | ICD-10-CM | POA: Diagnosis not present

## 2016-02-03 NOTE — Patient Instructions (Addendum)
Medicare Annual Wellness Visit  [facility] and the medical providers at [facility] strive to bring you the best medical care.  In doing so we not only want to address your current medical conditions and concerns but also to detect new conditions early and prevent illness, disease and health-related problems.    Medicare offers a yearly Wellness Visit which allows our clinical staff to assess your need for preventative services including immunizations, lifestyle education, counseling to decrease risk of preventable diseases and screening for fall risk and other medical concerns.    This visit is provided free of charge (no copay) for all Medicare recipients. The clinical pharmacists at [facility] have begun to conduct these Wellness Visits which will also include a thorough review of all your medications.    As you primary medical provider recommend that you make an appointment for your Annual Wellness Visit if you have not done so already this year.  You may set up this appointment before you leave today or you may call back [id number]) and schedule an appointment.  Please make sure when you call that you mention that you are scheduling your Annual Wellness Visit with the clinical pharmacist so that the appointment may be made for the proper length of time.     Continue current medications. Continue good therapeutic lifestyle changes which include good diet and exercise. Fall precautions discussed with patient. If an FOBT was given today- please return it to our front desk. If you are over 41 years old - you may need Prevnar 65 or the adult Pneumonia vaccine.  **Flu shots are available--- please call and schedule a FLU-CLINIC appointment**  After your visit with [place] today you will receive a survey in the mail or online from [employer] regarding your care with [place]. Please take a moment to fill this out. Your feedback is very  important to [place] as you can help [place] better understand your patient needs as well as improve your experience and satisfaction. WE CARE ABOUT YOU!!!   The patient will make every effort to drink more water and fluids She will schedule herself for the physical therapy rehabilitation area for routine exercise for gait strengthening

## 2016-02-03 NOTE — Progress Notes (Signed)
Subjective:    Patient ID: Janice Reed, female    DOB: 03/05/1922, 82 y.o.   MRN: [id number]  HPI Pt here for follow up and management of chronic medical problems which includes hypertension and hyperlipidemia. She is taking medications regularly.The patient's main complaint today is arthralgias and joint pain. She is due to get a chest x-ray and lab work today. She does not request any refills. She is also recovering from the shingles and this rash appears to be drying up and her pain has been minimal with this on her left hip. She brings in blood pressures for review from home and all of these are good with her current treatment. She also complains of some constipation and admits to not drinking enough water and fluids. She denies any chest pain palpitations or chest pressure. She has no trouble with her GI tract other than the constipation and denies nausea vomiting diarrhea or blood in the stool. She is passing her water without problems. She does not get enough physical activity and admits to this. She will consider scheduling herself to go back to physical therapy for the exercise regimen next-door.    Patient Active Problem List   Diagnosis Date Noted  . Benign essential HTN 05/22/2014  . Age-related macular degeneration, wet, both eyes ([place]) 03/12/2014  . Internal hemorrhoids without mention of complication 54/49/2010  . Immune fecal occult blood test + x 2 01/05/2012  . Cancer of right breast ([place]) 09/28/2011  . Hearing loss 12/27/2010  . Arthritis 12/27/2010  . Hyperlipidemia   . Phlebitis and thrombophlebitis of unspecified site   . Symptomatic menopausal or female climacteric states   . Malaise and fatigue   . Palpitations   . Nonspecific abnormal electrocardiogram (ECG) (EKG)   . Hypothyroidism   . Osteoporosis   . IBS (irritable bowel syndrome)    Outpatient Encounter Prescriptions as of 02/03/2016  Medication Sig  . Aflibercept (EYLEA IO) Inject into the eye every 30  (thirty) days.   [name] Kitchen aspirin 325 MG tablet Take 325 mg by mouth See admin instructions. Take one tablet a day and more if needed for pain - up to 3 times daily  . bevacizumab (AVASTIN) 1.25 mg/0.1 mL SOLN 1.25 mg by Intravitreal route every 30 (thirty) days.   . Calcium-Magnesium-Vitamin D (CITRACAL CALCIUM+D PO) Take 1 capsule by mouth 2 (two) times daily.    . Cholecalciferol (VITAMIN D3) 1000 UNITS CAPS Take 1,000 Units by mouth 2 (two) times daily.   [name] Kitchen levothyroxine (SYNTHROID, LEVOTHROID) 100 MCG tablet TAKE 1 TABLET EVERY DAY  . levothyroxine (SYNTHROID, LEVOTHROID) 25 MCG tablet Take 1 tablet (25 mcg total) by mouth daily before breakfast. As directed (Patient taking differently: Take 12.5 mcg by mouth daily before breakfast. Three days per week)  . Multiple Vitamins-Minerals (ICAPS AREDS FORMULA PO) Take 1 capsule by mouth 2 (two) times daily.   . Omega-3 Fatty Acids (FISH OIL) 1000 MG CAPS Take 1,000 mg by mouth 2 (two) times daily.   . ranitidine (ZANTAC) 150 MG tablet Take 150 mg by mouth 2 (two) times daily as needed for heartburn.  . risedronate (ACTONEL) 35 MG tablet TAKE 1 TABLET BY MOUTH  EVERY 7 DAYS WITH WATER ON  EMPTY STOMACH, NOTHING BY  MOUTH OR LIE DOWN FOR NEXT  30 MINUTES.  [name] Kitchen triamcinolone cream (KENALOG) 0.1 % Reported on 12/08/2015  . triamterene-hydrochlorothiazide (MAXZIDE-25) 37.5-25 MG tablet Take 0.5 tablets by mouth daily.  . [DISCONTINUED] benazepril (LOTENSIN) 10 MG  tablet Take 1 tablet by mouth  every day  . [DISCONTINUED] valACYclovir (VALTREX) 1000 MG tablet Take 1 tablet (1,000 mg total) by mouth 3 (three) times daily.   No facility-administered encounter medications on file as of 02/03/2016.      Review of Systems  Constitutional: Negative.   HENT: Negative.   Eyes: Negative.   Respiratory: Negative.   Cardiovascular: Negative.   Gastrointestinal: Negative.   Endocrine: Negative.   Genitourinary: Negative.   Musculoskeletal: Positive for arthralgias.    Skin: Negative.   Allergic/Immunologic: Negative.   Neurological: Negative.   Hematological: Negative.   Psychiatric/Behavioral: Negative.        Objective:   Physical Exam  Constitutional: She is oriented to person, place, and time. She appears well-developed and well-nourished. No distress.  Thin elderly and alert  HENT:  Head: Normocephalic and atraumatic.  Right Ear: External ear normal.  Left Ear: External ear normal.  Nose: Nose normal.  Mouth/Throat: Oropharynx is clear and moist.  Eyes: Conjunctivae and EOM are normal. Pupils are equal, round, and reactive to light. Right eye exhibits no discharge. Left eye exhibits no discharge. No scleral icterus.  Neck: Normal range of motion. Neck supple. No thyromegaly present.  No bruits or thyromegaly  Cardiovascular: Normal rate, regular rhythm, normal heart sounds and intact distal pulses.   No murmur heard. Heart is 84/m with a regular rate and rhythm  Pulmonary/Chest: Effort normal and breath sounds normal. No respiratory distress. She has no wheezes. She has no rales. She exhibits no tenderness.  Abdominal: Soft. Bowel sounds are normal. She exhibits no distension and no mass. There is no tenderness. There is no rebound and no guarding.  Musculoskeletal: She exhibits no edema or tenderness.  The patient uses a cane for ambulation  Lymphadenopathy:    She has no cervical adenopathy.  Neurological: She is alert and oriented to person, place, and time. She has normal reflexes. No cranial nerve deficit.  Skin: Skin is warm and dry. Rash noted.  The shingles rash appears to be healing on her left buttock area.  Psychiatric: She has a normal mood and affect. Her behavior is normal. Judgment and thought content normal.  Nursing note and vitals reviewed.  BP 165/81 mmHg  Pulse 88  Temp(Src) 97.7 F (36.5 C) (Oral)  Ht 5' 1" (1.549 m)  Wt 115 lb (52.164 kg)  BMI 21.74 kg/m2  [facility] reading (PRIMARY) by  Dr. Brunilda Payor x-ray  with results pending                                      Assessment & Plan:  1. Hypothyroidism, unspecified hypothyroidism type -Continue current treatment pending results of lab work - CBC with Differential/Platelet - Thyroid Panel With TSH  2. Hyperlipidemia -Continue current treatment along with as aggressive therapeutic lifestyle changes as possible pending lab work - [employer] 2 View; Future - CBC with Differential/Platelet - Lipid panel  3. Vitamin D deficiency -Continue current treatment pending results of lab work - CBC with Differential/Platelet - VITAMIN D 25 Hydroxy (Vit-D Deficiency, Fractures)  4. Benign essential HTN -Repeat blood pressure today was lower but still slightly elevated but after reviewing home readings will be no change in treatment. - DG Chest 2 View; Future - BMP8+EGFR - CBC with Differential/Platelet - Hepatic function panel  5. Gait instability -Patient will schedule for more physical therapy next-door  6. Shingles -  The shingles appear to be resolving and the patient is having minimal pain  Patient Instructions                       Medicare Annual Wellness Visit  [facility] and the medical providers at [facility] strive to bring you the best medical care.  In doing so we not only want to address your current medical conditions and concerns but also to detect new conditions early and prevent illness, disease and health-related problems.    Medicare offers a yearly Wellness Visit which allows our clinical staff to assess your need for preventative services including immunizations, lifestyle education, counseling to decrease risk of preventable diseases and screening for fall risk and other medical concerns.    This visit is provided free of charge (no copay) for all Medicare recipients. The clinical pharmacists at [facility] have begun to conduct these Wellness Visits which will also include a  thorough review of all your medications.    As you primary medical provider recommend that you make an appointment for your Annual Wellness Visit if you have not done so already this year.  You may set up this appointment before you leave today or you may call back ([phone]) and schedule an appointment.  Please make sure when you call that you mention that you are scheduling your Annual Wellness Visit with the clinical pharmacist so that the appointment may be made for the proper length of time.     Continue current medications. Continue good therapeutic lifestyle changes which include good diet and exercise. Fall precautions discussed with patient. If an FOBT was given today- please return it to our front desk. If you are over 51 years old - you may need Prevnar 37 or the adult Pneumonia vaccine.  **Flu shots are available--- please call and schedule a FLU-CLINIC appointment**  After your visit with [place] today you will receive a survey in the mail or online from [employer] regarding your care with [place]. Please take a moment to fill this out. Your feedback is very important to [place] as you can help [place] better understand your patient needs as well as improve your experience and satisfaction. WE CARE ABOUT YOU!!!   The patient will make every effort to drink more water and fluids She will schedule herself for the physical therapy rehabilitation area for routine exercise for gait strengthening     Arrie Senate MD

## 2016-02-04 LAB — CBC WITH DIFFERENTIAL/PLATELET
BASOS ABS: 0.1 10*3/uL (ref 0.0–0.2)
Basos: 1 %
EOS (ABSOLUTE): 0.2 10*3/uL (ref 0.0–0.4)
EOS: 3 %
HEMATOCRIT: 46.7 % — AB (ref 34.0–46.6)
HEMOGLOBIN: 15.3 g/dL (ref 11.1–15.9)
IMMATURE GRANS (ABS): 0 10*3/uL (ref 0.0–0.1)
IMMATURE GRANULOCYTES: 1 %
LYMPHS: 26 %
Lymphocytes Absolute: 1.7 10*3/uL (ref 0.7–3.1)
MCH: 31 pg (ref 26.6–33.0)
MCHC: 32.8 g/dL (ref 31.5–35.7)
MCV: 95 fL (ref 79–97)
MONOCYTES: 13 %
Monocytes Absolute: 0.8 10*3/uL (ref 0.1–0.9)
Neutrophils Absolute: 3.6 10*3/uL (ref 1.4–7.0)
Neutrophils: 56 %
Platelets: 200 10*3/uL (ref 150–379)
RBC: 4.93 x10E6/uL (ref 3.77–5.28)
RDW: 14.9 % (ref 12.3–15.4)
WBC: 6.3 10*3/uL (ref 3.4–10.8)

## 2016-02-04 LAB — BMP8+EGFR
BUN/Creatinine Ratio: 22 (ref 12–28)
BUN: 22 mg/dL (ref 10–36)
CALCIUM: 9.8 mg/dL (ref 8.7–10.3)
CHLORIDE: 95 mmol/L — AB (ref 96–106)
CO2: 27 mmol/L (ref 18–29)
CREATININE: 0.99 mg/dL (ref 0.57–1.00)
GFR calc Af Amer: 57 mL/min/{1.73_m2} — ABNORMAL LOW (ref >59)
GFR calc non Af Amer: 50 mL/min/{1.73_m2} — ABNORMAL LOW (ref >59)
GLUCOSE: 85 mg/dL (ref 65–99)
Potassium: 4 mmol/L (ref 3.5–5.2)
Sodium: 141 mmol/L (ref 134–144)

## 2016-02-04 LAB — LIPID PANEL
CHOLESTEROL TOTAL: 262 mg/dL — AB (ref 100–199)
Chol/HDL Ratio: 2.9 ratio units (ref 0.0–4.4)
HDL: 89 mg/dL (ref >39)
LDL CALC: 155 mg/dL — AB (ref 0–99)
TRIGLYCERIDES: 91 mg/dL (ref 0–149)
VLDL CHOLESTEROL CAL: 18 mg/dL (ref 5–40)

## 2016-02-04 LAB — HEPATIC FUNCTION PANEL
ALT: 14 IU/L (ref 0–32)
AST: 16 IU/L (ref 0–40)
Albumin: 4.2 g/dL (ref 3.2–4.6)
Alkaline Phosphatase: 83 IU/L (ref 39–117)
BILIRUBIN TOTAL: 0.3 mg/dL (ref 0.0–1.2)
Bilirubin, Direct: 0.11 mg/dL (ref 0.00–0.40)
Total Protein: 6.4 g/dL (ref 6.0–8.5)

## 2016-02-04 LAB — VITAMIN D 25 HYDROXY (VIT D DEFICIENCY, FRACTURES): Vit D, 25-Hydroxy: 47.3 ng/mL (ref 30.0–100.0)

## 2016-02-04 LAB — THYROID PANEL WITH TSH
Free Thyroxine Index: 3.1 (ref 1.2–4.9)
T3 UPTAKE RATIO: 29 % (ref 24–39)
T4, Total: 10.7 ug/dL (ref 4.5–12.0)
TSH: 1.38 u[IU]/mL (ref 0.450–4.500)

## 2016-02-07 ENCOUNTER — Other Ambulatory Visit: Payer: Self-pay | Admitting: Family Medicine

## 2016-02-11 ENCOUNTER — Ambulatory Visit (INDEPENDENT_AMBULATORY_CARE_PROVIDER_SITE_OTHER): Payer: Medicare Other | Admitting: Family Medicine

## 2016-02-11 ENCOUNTER — Encounter: Payer: Self-pay | Admitting: Family Medicine

## 2016-02-11 VITALS — BP 153/82 | HR 89 | Temp 97.1°F | Ht 61.0 in | Wt 116.8 lb

## 2016-02-11 DIAGNOSIS — R197 Diarrhea, unspecified: Secondary | ICD-10-CM

## 2016-02-11 LAB — URINALYSIS, COMPLETE
Bilirubin, UA: NEGATIVE
GLUCOSE, UA: NEGATIVE
KETONES UA: NEGATIVE
NITRITE UA: NEGATIVE
PROTEIN UA: NEGATIVE
SPEC GRAV UA: 1.015 (ref 1.005–1.030)
Urobilinogen, Ur: 0.2 mg/dL (ref 0.2–1.0)
pH, UA: 7 (ref 5.0–7.5)

## 2016-02-11 LAB — MICROSCOPIC EXAMINATION: Epithelial Cells (non renal): >10 /hpf — AB (ref 0–10)

## 2016-02-11 NOTE — Progress Notes (Signed)
   HPI  Patient presents today with diarrhea.  Patient explains that she's had symptoms on most days for the last 5 days. She's had 2-3 episodes of large watery stools a day. No blood, no foul smell.  She is tolerating food and fluids normally.  Over the last 3 days she's had fatigue and feeling like her legs are heavy. She's tried Imodium for one day with good improvement.  Her daughter is worried about UTI, she developed one last year whenever she had diarrhea in this manner.  She denies any abdominal pain, suprapubic pressure, or any other pains.  PMH: Smoking status noted ROS: Per HPI  Objective: BP 153/82 mmHg  Pulse 89  Temp(Src) 97.1 F (36.2 C) (Oral)  Ht 5\' 1"  (1.549 m)  Wt 116 lb 12.8 oz (52.98 kg)  BMI 22.08 kg/m2 Gen: NAD, alert, cooperative with exam HEENT: NCAT CV: RRR, good S1/S2, no murmur Resp: CTABL, no wheezes, non-labored Abd: SNTND, BS present, no guarding or organomegaly Ext: No edema, warm Neuro: Alert and oriented, No gross deficits  Assessment and plan:  # Diarrhea Appears to be functional diarrhea, she is low risk and does not have any signs of Clostridium difficile infection. Recommended using loperamide Discussed good hydration We'll check for UTI, also no signs of diverticulitis. Return to clinic if she is not improving as expected, also return to clinic if weakness worsens or is unable to tolerate food or fluids.     Laroy Apple, MD [facility] 02/11/2016, 11:54 AM

## 2016-02-11 NOTE — Patient Instructions (Signed)
Great to meet you!  Be sure to get plenty of fluids. Gatorade is a good option but you should add about 1/3 to 1/2 water to each glass.   Use immodium as we expected, please seek medical help for any fevers, pain, or severe weakness.   Diarrhea Diarrhea is frequent loose and watery bowel movements. It can cause you to feel weak and dehydrated. Dehydration can cause you to become tired and thirsty, have a dry mouth, and have decreased urination that often is dark yellow. Diarrhea is a sign of another problem, most often an infection that will not last long. In most cases, diarrhea typically lasts 2-3 days. However, it can last longer if it is a sign of something more serious. It is important to treat your diarrhea as directed by your caregiver to lessen or prevent future episodes of diarrhea. CAUSES  Some common causes include:  Gastrointestinal infections caused by viruses, bacteria, or parasites.  Food poisoning or food allergies.  Certain medicines, such as antibiotics, chemotherapy, and laxatives.  Artificial sweeteners and fructose.  Digestive disorders. HOME CARE INSTRUCTIONS  Ensure adequate fluid intake (hydration): Have 1 cup (8 oz) of fluid for each diarrhea episode. Avoid fluids that contain simple sugars or sports drinks, fruit juices, whole milk products, and sodas. Your urine should be clear or pale yellow if you are drinking enough fluids. Hydrate with an oral rehydration solution that you can purchase at pharmacies, retail stores, and online. You can prepare an oral rehydration solution at home by mixing the following ingredients together:   - tsp table salt.   tsp baking soda.   tsp salt substitute containing potassium chloride.  1  tablespoons sugar.  1 L (34 oz) of water.  Certain foods and beverages may increase the speed at which food moves through the gastrointestinal (GI) tract. These foods and beverages should be avoided and include:  Caffeinated and  alcoholic beverages.  High-fiber foods, such as raw fruits and vegetables, nuts, seeds, and whole grain breads and cereals.  Foods and beverages sweetened with sugar alcohols, such as xylitol, sorbitol, and mannitol.  Some foods may be well tolerated and may help thicken stool including:  Starchy foods, such as rice, toast, pasta, low-sugar cereal, oatmeal, grits, baked potatoes, crackers, and bagels.  Bananas.  Applesauce.  Add probiotic-rich foods to help increase healthy bacteria in the GI tract, such as yogurt and fermented milk products.  Wash your hands well after each diarrhea episode.  Only take over-the-counter or prescription medicines as directed by your caregiver.  Take a warm bath to relieve any burning or pain from frequent diarrhea episodes. SEEK IMMEDIATE MEDICAL CARE IF:   You are unable to keep fluids down.  You have persistent vomiting.  You have blood in your stool, or your stools are black and tarry.  You do not urinate in 6-8 hours, or there is only a small amount of very dark urine.  You have abdominal pain that increases or localizes.  You have weakness, dizziness, confusion, or light-headedness.  You have a severe headache.  Your diarrhea gets worse or does not get better.  You have a fever or persistent symptoms for more than 2-3 days.  You have a fever and your symptoms suddenly get worse. MAKE SURE YOU:   Understand these instructions.  Will watch your condition.  Will get help right away if you are not doing well or get worse.   This information is not intended to replace advice given to  you by your health care provider. Make sure you discuss any questions you have with your health care provider.   Document Released: 07/14/2002 Document Revised: 08/14/2014 Document Reviewed: 03/31/2012 Elsevier Interactive Patient Education [employer].

## 2016-02-12 ENCOUNTER — Other Ambulatory Visit: Payer: Self-pay | Admitting: Family Medicine

## 2016-02-13 ENCOUNTER — Other Ambulatory Visit: Payer: Self-pay | Admitting: Family Medicine

## 2016-02-13 LAB — URINE CULTURE

## 2016-02-14 ENCOUNTER — Other Ambulatory Visit: Payer: Self-pay | Admitting: Family Medicine

## 2016-02-14 MED ORDER — CIPROFLOXACIN HCL 250 MG PO TABS: 250.0000 mg | ORAL_TABLET | Freq: Two times a day (BID) | ORAL | Status: DC

## 2016-02-14 MED ORDER — LEVOTHYROXINE SODIUM 100 MCG PO TABS: 100.0000 ug | ORAL_TABLET | Freq: Every day | ORAL | Status: DC

## 2016-02-14 NOTE — Telephone Encounter (Signed)
Patient aware rx sent to [facility] at her request.

## 2016-02-21 ENCOUNTER — Encounter: Payer: Self-pay | Admitting: Family Medicine

## 2016-02-21 ENCOUNTER — Ambulatory Visit (INDEPENDENT_AMBULATORY_CARE_PROVIDER_SITE_OTHER): Payer: Medicare Other | Admitting: Family Medicine

## 2016-02-21 ENCOUNTER — Ambulatory Visit (INDEPENDENT_AMBULATORY_CARE_PROVIDER_SITE_OTHER): Payer: Medicare Other

## 2016-02-21 VITALS — BP 150/79 | HR 82 | Temp 97.5°F | Ht 61.0 in | Wt 116.0 lb

## 2016-02-21 DIAGNOSIS — W19XXXA Unspecified fall, initial encounter: Secondary | ICD-10-CM

## 2016-02-21 DIAGNOSIS — M25551 Pain in right hip: Secondary | ICD-10-CM | POA: Diagnosis not present

## 2016-02-21 DIAGNOSIS — Z87440 Personal history of urinary (tract) infections: Secondary | ICD-10-CM

## 2016-02-21 DIAGNOSIS — S2242XA Multiple fractures of ribs, left side, initial encounter for closed fracture: Secondary | ICD-10-CM

## 2016-02-21 DIAGNOSIS — S2232XA Fracture of one rib, left side, initial encounter for closed fracture: Secondary | ICD-10-CM | POA: Diagnosis not present

## 2016-02-21 DIAGNOSIS — S0083XA Contusion of other part of head, initial encounter: Secondary | ICD-10-CM | POA: Diagnosis not present

## 2016-02-21 DIAGNOSIS — R29898 Other symptoms and signs involving the musculoskeletal system: Secondary | ICD-10-CM

## 2016-02-21 DIAGNOSIS — W01198A Fall on same level from slipping, tripping and stumbling with subsequent striking against other object, initial encounter: Secondary | ICD-10-CM | POA: Diagnosis not present

## 2016-02-21 DIAGNOSIS — R0781 Pleurodynia: Secondary | ICD-10-CM | POA: Diagnosis not present

## 2016-02-21 LAB — URINALYSIS, COMPLETE
BILIRUBIN UA: NEGATIVE
Ketones, UA: NEGATIVE
NITRITE UA: NEGATIVE
PH UA: 6.5 (ref 5.0–7.5)
Protein, UA: NEGATIVE
SPEC GRAV UA: 1.02 (ref 1.005–1.030)
UUROB: 0.2 mg/dL (ref 0.2–1.0)

## 2016-02-21 LAB — MICROSCOPIC EXAMINATION: Bacteria, UA: NONE SEEN

## 2016-02-21 NOTE — Progress Notes (Addendum)
Subjective:    Patient ID: Janice Reed, female    DOB: 02/09/1922, 82 y.o.   MRN: [id number]  HPI Patient is here today complaining with right hip pain and left rib pain. Patient also have a bruise on the righ side of her face. Patient fell yesterday. She fell against a wooden beam and also hurt the right side of her face and her right elbow.The patient has a right facial contusion and some stiffness in the right hip but the biggest concern is her left lateral inferior rib cage. She has not had any significant shortness of breath. She is smiling and pleasant and in no acute distress. Her only chest pain includes the left rib area. She denies any other symptoms other than swelling in her legs and she was not able to put on her support hose today. The daughter indicates that she has not been using her walker as regularly as she should. She is not having any urinary tract symptoms currently nor is she having any loose bowel movements. We will recheck a urinalysis today before she leaves as well as blood work because of the edema and swelling in her legs.      Patient Active Problem List   Diagnosis Date Noted  . Benign essential HTN 05/22/2014  . Age-related macular degeneration, wet, both eyes ([place]) 03/12/2014  . Internal hemorrhoids without mention of complication 98/33/8250  . Immune fecal occult blood test + x 2 01/05/2012  . Cancer of right breast ([place]) 09/28/2011  . Hearing loss 12/27/2010  . Arthritis 12/27/2010  . Hyperlipidemia   . Phlebitis and thrombophlebitis of unspecified site   . Symptomatic menopausal or female climacteric states   . Malaise and fatigue   . Palpitations   . Nonspecific abnormal electrocardiogram (ECG) (EKG)   . Hypothyroidism   . Osteoporosis   . IBS (irritable bowel syndrome)    Outpatient Encounter Prescriptions as of 02/21/2016  Medication Sig  . Aflibercept (EYLEA IO) Inject into the eye every 30 (thirty) days.   [name] Kitchen aspirin 325 MG tablet Take 325  mg by mouth See admin instructions. Take one tablet a day and more if needed for pain - up to 3 times daily  . bevacizumab (AVASTIN) 1.25 mg/0.1 mL SOLN 1.25 mg by Intravitreal route every 30 (thirty) days.   . Calcium-Magnesium-Vitamin D (CITRACAL CALCIUM+D PO) Take 1 capsule by mouth 2 (two) times daily.    . Cholecalciferol (VITAMIN D3) 1000 UNITS CAPS Take 1,000 Units by mouth 2 (two) times daily.   [name] Kitchen levothyroxine (SYNTHROID, LEVOTHROID) 100 MCG tablet Take 1 tablet (100 mcg total) by mouth daily.  [name] Kitchen levothyroxine (SYNTHROID, LEVOTHROID) 25 MCG tablet TAKE 1 TABLET (25 MCG TOTAL) BY MOUTH DAILY BEFORE BREAKFAST. AS DIRECTED  . Multiple Vitamins-Minerals (ICAPS AREDS FORMULA PO) Take 1 capsule by mouth 2 (two) times daily.   . Omega-3 Fatty Acids (FISH OIL) 1000 MG CAPS Take 1,000 mg by mouth 2 (two) times daily.   . ranitidine (ZANTAC) 150 MG tablet Take 150 mg by mouth 2 (two) times daily as needed for heartburn.  . risedronate (ACTONEL) 35 MG tablet TAKE 1 TABLET BY MOUTH  EVERY 7 DAYS WITH WATER ON  EMPTY STOMACH, NOTHING BY  MOUTH OR LIE DOWN FOR NEXT  30 MINUTES.  [name] Kitchen triamcinolone cream (KENALOG) 0.1 % Reported on 12/08/2015  . triamterene-hydrochlorothiazide (MAXZIDE-25) 37.5-25 MG tablet Take 0.5 tablets by mouth daily.  . [DISCONTINUED] ciprofloxacin (CIPRO) 250 MG tablet Take 1 tablet (250  mg total) by mouth 2 (two) times daily.  . [DISCONTINUED] levothyroxine (SYNTHROID, LEVOTHROID) 25 MCG tablet Take 1 tablet by mouth  daily before breakfast as  directed   No facility-administered encounter medications on file as of 02/21/2016.     Review of Systems  Constitutional: Negative.   HENT: Negative.   Eyes: Negative.   Respiratory: Negative.   Cardiovascular: Negative.   Gastrointestinal: Negative.   Endocrine: Negative.   Genitourinary: Negative.   Musculoskeletal: Positive for arthralgias (right hip and left rib pain).  Skin: Negative.        Bruising to right elbow and face    Allergic/Immunologic: Negative.   Neurological: Negative.   Hematological: Negative.   Psychiatric/Behavioral: Negative.        Objective:   Physical Exam  Constitutional: She is oriented to person, place, and time.  The patient is pleasant and alert and kyphotic and in no acute distress  HENT:  Head: Normocephalic.  The patient has bruising around her right TMJ area.  Eyes: Conjunctivae and EOM are normal. Pupils are equal, round, and reactive to light. Right eye exhibits no discharge. Left eye exhibits no discharge. No scleral icterus.  Neck: Normal range of motion. Neck supple.  Cardiovascular: Normal rate, regular rhythm and normal heart sounds.   No murmur heard. Pulmonary/Chest: Effort normal and breath sounds normal. She has no wheezes. She has no rales. She exhibits tenderness.  Abdominal: Soft. Bowel sounds are normal. There is no rebound and no guarding.  Musculoskeletal: She exhibits edema. She exhibits no tenderness.  The patient is tender over the left lower rib cage anteriorly. She does have edema in both ankles with the left being greater than right. This is usually not as bad when she wears her support hose. Years ago she had thrombophlebitis in the left leg.  Neurological: She is alert and oriented to person, place, and time.  Skin: No rash noted.  Psychiatric: She has a normal mood and affect. Her behavior is normal. Judgment and thought content normal.  Nursing note and vitals reviewed.  BP 150/79 mmHg  Pulse 82  Temp(Src) 97.5 F (36.4 C) (Oral)  Ht '5\' 1"'  (1.549 m)  Wt 116 lb (52.617 kg)  BMI 21.93 kg/m2  [facility] reading (PRIMARY) by  Dr. Cleon Dew detail reviews fractures of the eighth and ninth rib on the left side. The right hip reveals degenerative changes with no acute fracture.                                        Assessment & Plan:  1. Fall, initial encounter -Use walker regularly - DG HIP UNILAT W OR W/O PELVIS 2-3 VIEWS RIGHT; Future - DG  Ribs Unilateral W/Chest Left; Future - Home Health - Face-to-face encounter (required for Medicare/Medicaid patients)  2. Recent urinary tract infection -Continue to drink plenty of fluids and stay well hydrated - CBC with Differential/Platelet - Urinalysis, Complete  3. Weakness of both legs -Arrange for home physical therapy - CBC with Differential/Platelet - BMP8+EGFR - Urinalysis, Complete - Home Health - Face-to-face encounter (required for Medicare/Medicaid patients)  4. Right hip pain -Take Tylenol and aspirin as directed - Home Health - Face-to-face encounter (required for Medicare/Medicaid patients)  5. Left rib fracture, closed, initial encounter -Take Tylenol and aspirin as directed after 48 hours warm wet compresses to left side ice initially - Home Health - Face-to-face encounter (  required for Medicare/Medicaid patients)  6. Fracture of multiple ribs, left, closed, initial encounter -Ice and then warm wet compresses  7. Facial contusion, initial encounter  Patient Instructions  The patient can take one coated aspirin after breakfast and after supper if needed for pain. She can take one extra strength Tylenol after lunch and at bedtime if needed for pain She should use her walker regularly She should practice taking some deep breaths We will arrange for home physical therapy for gait strengthening We will also give her a prescription for a lift chair We will call with lab work and urine results as soon as those become available   Arrie Senate MD

## 2016-02-21 NOTE — Patient Instructions (Signed)
The patient can take one coated aspirin after breakfast and after supper if needed for pain. She can take one extra strength Tylenol after lunch and at bedtime if needed for pain She should use her walker regularly She should practice taking some deep breaths We will arrange for home physical therapy for gait strengthening We will also give her a prescription for a lift chair We will call with lab work and urine results as soon as those become available

## 2016-02-21 NOTE — Addendum Note (Signed)
Addended by: Zannie Cove on: 02/21/2016 05:51 PM   Modules accepted: Orders

## 2016-02-22 ENCOUNTER — Telehealth: Payer: Self-pay | Admitting: *Deleted

## 2016-02-22 DIAGNOSIS — R531 Weakness: Secondary | ICD-10-CM | POA: Diagnosis not present

## 2016-02-22 DIAGNOSIS — M810 Age-related osteoporosis without current pathological fracture: Secondary | ICD-10-CM | POA: Diagnosis not present

## 2016-02-22 DIAGNOSIS — Z880 Allergy status to penicillin: Secondary | ICD-10-CM | POA: Diagnosis not present

## 2016-02-22 DIAGNOSIS — M6282 Rhabdomyolysis: Secondary | ICD-10-CM | POA: Diagnosis not present

## 2016-02-22 DIAGNOSIS — Z9181 History of falling: Secondary | ICD-10-CM | POA: Diagnosis not present

## 2016-02-22 DIAGNOSIS — R410 Disorientation, unspecified: Secondary | ICD-10-CM | POA: Diagnosis not present

## 2016-02-22 DIAGNOSIS — Z882 Allergy status to sulfonamides status: Secondary | ICD-10-CM | POA: Diagnosis not present

## 2016-02-22 DIAGNOSIS — S299XXA Unspecified injury of thorax, initial encounter: Secondary | ICD-10-CM | POA: Diagnosis not present

## 2016-02-22 DIAGNOSIS — R2241 Localized swelling, mass and lump, right lower limb: Secondary | ICD-10-CM | POA: Diagnosis not present

## 2016-02-22 DIAGNOSIS — R220 Localized swelling, mass and lump, head: Secondary | ICD-10-CM | POA: Diagnosis not present

## 2016-02-22 DIAGNOSIS — W19XXXA Unspecified fall, initial encounter: Secondary | ICD-10-CM | POA: Diagnosis not present

## 2016-02-22 DIAGNOSIS — E039 Hypothyroidism, unspecified: Secondary | ICD-10-CM | POA: Diagnosis not present

## 2016-02-22 DIAGNOSIS — R2242 Localized swelling, mass and lump, left lower limb: Secondary | ICD-10-CM | POA: Diagnosis not present

## 2016-02-22 DIAGNOSIS — S0990XA Unspecified injury of head, initial encounter: Secondary | ICD-10-CM | POA: Diagnosis not present

## 2016-02-22 DIAGNOSIS — Z7982 Long term (current) use of aspirin: Secondary | ICD-10-CM | POA: Diagnosis not present

## 2016-02-22 LAB — CBC WITH DIFFERENTIAL/PLATELET
BASOS: 0 %
Basophils Absolute: 0 10*3/uL (ref 0.0–0.2)
EOS (ABSOLUTE): 0.1 10*3/uL (ref 0.0–0.4)
Eos: 1 %
Hematocrit: 41.6 % (ref 34.0–46.6)
Hemoglobin: 14.4 g/dL (ref 11.1–15.9)
Immature Grans (Abs): 0 10*3/uL (ref 0.0–0.1)
Immature Granulocytes: 0 %
LYMPHS ABS: 1.7 10*3/uL (ref 0.7–3.1)
Lymphs: 16 %
MCH: 31.2 pg (ref 26.6–33.0)
MCHC: 34.6 g/dL (ref 31.5–35.7)
MCV: 90 fL (ref 79–97)
MONOS ABS: 1 10*3/uL — AB (ref 0.1–0.9)
Monocytes: 10 %
NEUTROS ABS: 7.9 10*3/uL — AB (ref 1.4–7.0)
NEUTROS PCT: 73 %
PLATELETS: 243 10*3/uL (ref 150–379)
RBC: 4.61 x10E6/uL (ref 3.77–5.28)
RDW: 14.7 % (ref 12.3–15.4)
WBC: 10.7 10*3/uL (ref 3.4–10.8)

## 2016-02-22 LAB — BMP8+EGFR
BUN / CREAT RATIO: 21 (ref 12–28)
BUN: 16 mg/dL (ref 10–36)
CALCIUM: 9.3 mg/dL (ref 8.7–10.3)
CHLORIDE: 86 mmol/L — AB (ref 96–106)
CO2: 26 mmol/L (ref 18–29)
Creatinine, Ser: 0.78 mg/dL (ref 0.57–1.00)
GFR calc non Af Amer: 66 mL/min/{1.73_m2} (ref >59)
GFR, EST AFRICAN AMERICAN: 76 mL/min/{1.73_m2} (ref >59)
Glucose: 104 mg/dL — ABNORMAL HIGH (ref 65–99)
POTASSIUM: 3.8 mmol/L (ref 3.5–5.2)
Sodium: 131 mmol/L — ABNORMAL LOW (ref 134–144)

## 2016-02-22 LAB — URINE CULTURE

## 2016-02-22 NOTE — Telephone Encounter (Signed)
-----   Message from Chipper Herb, MD sent at 02/21/2016  5:51 PM EDT ----- The urinalysis has 6-10 WBC per high-power field. She's not having any symptoms. Please make sure that the lab does a culture and sensitivity. We will not do any treatment until this is returned

## 2016-02-22 NOTE — Telephone Encounter (Signed)
Called with patient's lab results and [name] states that when she got to patient's house she was laying on the ground conscious.  Patient was sent to the hospital via EMS

## 2016-02-23 DIAGNOSIS — R531 Weakness: Secondary | ICD-10-CM | POA: Diagnosis not present

## 2016-02-23 DIAGNOSIS — M6282 Rhabdomyolysis: Secondary | ICD-10-CM | POA: Diagnosis not present

## 2016-02-24 DIAGNOSIS — Z882 Allergy status to sulfonamides status: Secondary | ICD-10-CM | POA: Diagnosis not present

## 2016-02-24 DIAGNOSIS — H353221 Exudative age-related macular degeneration, left eye, with active choroidal neovascularization: Secondary | ICD-10-CM | POA: Diagnosis not present

## 2016-02-24 DIAGNOSIS — R2689 Other abnormalities of gait and mobility: Secondary | ICD-10-CM | POA: Diagnosis not present

## 2016-02-24 DIAGNOSIS — E039 Hypothyroidism, unspecified: Secondary | ICD-10-CM | POA: Diagnosis not present

## 2016-02-24 DIAGNOSIS — M6282 Rhabdomyolysis: Secondary | ICD-10-CM | POA: Diagnosis not present

## 2016-02-24 DIAGNOSIS — M1990 Unspecified osteoarthritis, unspecified site: Secondary | ICD-10-CM | POA: Diagnosis not present

## 2016-02-24 DIAGNOSIS — W19XXXD Unspecified fall, subsequent encounter: Secondary | ICD-10-CM | POA: Diagnosis not present

## 2016-02-24 DIAGNOSIS — Z9181 History of falling: Secondary | ICD-10-CM | POA: Diagnosis not present

## 2016-02-24 DIAGNOSIS — Z880 Allergy status to penicillin: Secondary | ICD-10-CM | POA: Diagnosis not present

## 2016-02-24 DIAGNOSIS — M810 Age-related osteoporosis without current pathological fracture: Secondary | ICD-10-CM | POA: Diagnosis not present

## 2016-02-24 DIAGNOSIS — I10 Essential (primary) hypertension: Secondary | ICD-10-CM | POA: Diagnosis not present

## 2016-02-24 DIAGNOSIS — S2242XD Multiple fractures of ribs, left side, subsequent encounter for fracture with routine healing: Secondary | ICD-10-CM | POA: Diagnosis not present

## 2016-02-24 DIAGNOSIS — R531 Weakness: Secondary | ICD-10-CM | POA: Diagnosis not present

## 2016-02-24 DIAGNOSIS — E038 Other specified hypothyroidism: Secondary | ICD-10-CM | POA: Diagnosis not present

## 2016-02-24 DIAGNOSIS — S2242XA Multiple fractures of ribs, left side, initial encounter for closed fracture: Secondary | ICD-10-CM | POA: Diagnosis not present

## 2016-02-24 DIAGNOSIS — Z7982 Long term (current) use of aspirin: Secondary | ICD-10-CM | POA: Diagnosis not present

## 2016-02-24 DIAGNOSIS — M818 Other osteoporosis without current pathological fracture: Secondary | ICD-10-CM | POA: Diagnosis not present

## 2016-02-24 DIAGNOSIS — H353211 Exudative age-related macular degeneration, right eye, with active choroidal neovascularization: Secondary | ICD-10-CM | POA: Diagnosis not present

## 2016-03-01 ENCOUNTER — Ambulatory Visit: Payer: Medicare Other | Admitting: Family Medicine

## 2016-04-04 DIAGNOSIS — H353221 Exudative age-related macular degeneration, left eye, with active choroidal neovascularization: Secondary | ICD-10-CM | POA: Diagnosis not present

## 2016-04-04 DIAGNOSIS — H353211 Exudative age-related macular degeneration, right eye, with active choroidal neovascularization: Secondary | ICD-10-CM | POA: Diagnosis not present

## 2016-04-10 DIAGNOSIS — E038 Other specified hypothyroidism: Secondary | ICD-10-CM | POA: Diagnosis not present

## 2016-04-10 DIAGNOSIS — M818 Other osteoporosis without current pathological fracture: Secondary | ICD-10-CM | POA: Diagnosis not present

## 2016-04-10 DIAGNOSIS — I10 Essential (primary) hypertension: Secondary | ICD-10-CM | POA: Diagnosis not present

## 2016-04-11 DIAGNOSIS — R296 Repeated falls: Secondary | ICD-10-CM | POA: Diagnosis not present

## 2016-04-11 DIAGNOSIS — S2243XD Multiple fractures of ribs, bilateral, subsequent encounter for fracture with routine healing: Secondary | ICD-10-CM | POA: Diagnosis not present

## 2016-04-11 DIAGNOSIS — M150 Primary generalized (osteo)arthritis: Secondary | ICD-10-CM | POA: Diagnosis not present

## 2016-04-11 DIAGNOSIS — I10 Essential (primary) hypertension: Secondary | ICD-10-CM | POA: Diagnosis not present

## 2016-04-11 DIAGNOSIS — Z7982 Long term (current) use of aspirin: Secondary | ICD-10-CM | POA: Diagnosis not present

## 2016-04-11 DIAGNOSIS — E039 Hypothyroidism, unspecified: Secondary | ICD-10-CM | POA: Diagnosis not present

## 2016-04-11 DIAGNOSIS — E785 Hyperlipidemia, unspecified: Secondary | ICD-10-CM | POA: Diagnosis not present

## 2016-04-11 DIAGNOSIS — M6282 Rhabdomyolysis: Secondary | ICD-10-CM | POA: Diagnosis not present

## 2016-04-11 DIAGNOSIS — Z9181 History of falling: Secondary | ICD-10-CM | POA: Diagnosis not present

## 2016-04-14 DIAGNOSIS — E039 Hypothyroidism, unspecified: Secondary | ICD-10-CM | POA: Diagnosis not present

## 2016-04-14 DIAGNOSIS — I10 Essential (primary) hypertension: Secondary | ICD-10-CM | POA: Diagnosis not present

## 2016-04-14 DIAGNOSIS — Z7982 Long term (current) use of aspirin: Secondary | ICD-10-CM | POA: Diagnosis not present

## 2016-04-14 DIAGNOSIS — M6282 Rhabdomyolysis: Secondary | ICD-10-CM | POA: Diagnosis not present

## 2016-04-14 DIAGNOSIS — R296 Repeated falls: Secondary | ICD-10-CM | POA: Diagnosis not present

## 2016-04-14 DIAGNOSIS — M150 Primary generalized (osteo)arthritis: Secondary | ICD-10-CM | POA: Diagnosis not present

## 2016-04-14 DIAGNOSIS — S2243XD Multiple fractures of ribs, bilateral, subsequent encounter for fracture with routine healing: Secondary | ICD-10-CM | POA: Diagnosis not present

## 2016-04-14 DIAGNOSIS — Z9181 History of falling: Secondary | ICD-10-CM | POA: Diagnosis not present

## 2016-04-14 DIAGNOSIS — E785 Hyperlipidemia, unspecified: Secondary | ICD-10-CM | POA: Diagnosis not present

## 2016-04-15 DIAGNOSIS — S2243XD Multiple fractures of ribs, bilateral, subsequent encounter for fracture with routine healing: Secondary | ICD-10-CM | POA: Diagnosis not present

## 2016-04-15 DIAGNOSIS — M6282 Rhabdomyolysis: Secondary | ICD-10-CM | POA: Diagnosis not present

## 2016-04-15 DIAGNOSIS — I10 Essential (primary) hypertension: Secondary | ICD-10-CM | POA: Diagnosis not present

## 2016-04-15 DIAGNOSIS — Z9181 History of falling: Secondary | ICD-10-CM | POA: Diagnosis not present

## 2016-04-15 DIAGNOSIS — E785 Hyperlipidemia, unspecified: Secondary | ICD-10-CM | POA: Diagnosis not present

## 2016-04-15 DIAGNOSIS — E039 Hypothyroidism, unspecified: Secondary | ICD-10-CM | POA: Diagnosis not present

## 2016-04-15 DIAGNOSIS — Z7982 Long term (current) use of aspirin: Secondary | ICD-10-CM | POA: Diagnosis not present

## 2016-04-15 DIAGNOSIS — M150 Primary generalized (osteo)arthritis: Secondary | ICD-10-CM | POA: Diagnosis not present

## 2016-04-15 DIAGNOSIS — R296 Repeated falls: Secondary | ICD-10-CM | POA: Diagnosis not present

## 2016-04-17 DIAGNOSIS — M6282 Rhabdomyolysis: Secondary | ICD-10-CM | POA: Diagnosis not present

## 2016-04-17 DIAGNOSIS — I10 Essential (primary) hypertension: Secondary | ICD-10-CM | POA: Diagnosis not present

## 2016-04-17 DIAGNOSIS — Z7982 Long term (current) use of aspirin: Secondary | ICD-10-CM | POA: Diagnosis not present

## 2016-04-17 DIAGNOSIS — M150 Primary generalized (osteo)arthritis: Secondary | ICD-10-CM | POA: Diagnosis not present

## 2016-04-17 DIAGNOSIS — E785 Hyperlipidemia, unspecified: Secondary | ICD-10-CM | POA: Diagnosis not present

## 2016-04-17 DIAGNOSIS — E039 Hypothyroidism, unspecified: Secondary | ICD-10-CM | POA: Diagnosis not present

## 2016-04-17 DIAGNOSIS — Z9181 History of falling: Secondary | ICD-10-CM | POA: Diagnosis not present

## 2016-04-17 DIAGNOSIS — S2243XD Multiple fractures of ribs, bilateral, subsequent encounter for fracture with routine healing: Secondary | ICD-10-CM | POA: Diagnosis not present

## 2016-04-17 DIAGNOSIS — R296 Repeated falls: Secondary | ICD-10-CM | POA: Diagnosis not present

## 2016-04-18 DIAGNOSIS — Z7982 Long term (current) use of aspirin: Secondary | ICD-10-CM | POA: Diagnosis not present

## 2016-04-18 DIAGNOSIS — Z9181 History of falling: Secondary | ICD-10-CM | POA: Diagnosis not present

## 2016-04-18 DIAGNOSIS — E039 Hypothyroidism, unspecified: Secondary | ICD-10-CM | POA: Diagnosis not present

## 2016-04-18 DIAGNOSIS — S2243XD Multiple fractures of ribs, bilateral, subsequent encounter for fracture with routine healing: Secondary | ICD-10-CM | POA: Diagnosis not present

## 2016-04-18 DIAGNOSIS — R296 Repeated falls: Secondary | ICD-10-CM | POA: Diagnosis not present

## 2016-04-18 DIAGNOSIS — E785 Hyperlipidemia, unspecified: Secondary | ICD-10-CM | POA: Diagnosis not present

## 2016-04-18 DIAGNOSIS — M150 Primary generalized (osteo)arthritis: Secondary | ICD-10-CM | POA: Diagnosis not present

## 2016-04-18 DIAGNOSIS — M6282 Rhabdomyolysis: Secondary | ICD-10-CM | POA: Diagnosis not present

## 2016-04-18 DIAGNOSIS — I10 Essential (primary) hypertension: Secondary | ICD-10-CM | POA: Diagnosis not present

## 2016-04-19 ENCOUNTER — Other Ambulatory Visit: Payer: Self-pay | Admitting: Family Medicine

## 2016-04-19 DIAGNOSIS — S2243XD Multiple fractures of ribs, bilateral, subsequent encounter for fracture with routine healing: Secondary | ICD-10-CM | POA: Diagnosis not present

## 2016-04-19 DIAGNOSIS — M6282 Rhabdomyolysis: Secondary | ICD-10-CM | POA: Diagnosis not present

## 2016-04-19 DIAGNOSIS — E785 Hyperlipidemia, unspecified: Secondary | ICD-10-CM | POA: Diagnosis not present

## 2016-04-19 DIAGNOSIS — Z7982 Long term (current) use of aspirin: Secondary | ICD-10-CM | POA: Diagnosis not present

## 2016-04-19 DIAGNOSIS — R296 Repeated falls: Secondary | ICD-10-CM | POA: Diagnosis not present

## 2016-04-19 DIAGNOSIS — I10 Essential (primary) hypertension: Secondary | ICD-10-CM | POA: Diagnosis not present

## 2016-04-19 DIAGNOSIS — Z9181 History of falling: Secondary | ICD-10-CM | POA: Diagnosis not present

## 2016-04-19 DIAGNOSIS — E039 Hypothyroidism, unspecified: Secondary | ICD-10-CM | POA: Diagnosis not present

## 2016-04-19 DIAGNOSIS — M150 Primary generalized (osteo)arthritis: Secondary | ICD-10-CM | POA: Diagnosis not present

## 2016-04-20 ENCOUNTER — Telehealth: Payer: Self-pay

## 2016-04-20 ENCOUNTER — Other Ambulatory Visit: Payer: Self-pay

## 2016-04-20 DIAGNOSIS — M150 Primary generalized (osteo)arthritis: Secondary | ICD-10-CM | POA: Diagnosis not present

## 2016-04-20 DIAGNOSIS — R296 Repeated falls: Secondary | ICD-10-CM | POA: Diagnosis not present

## 2016-04-20 DIAGNOSIS — I10 Essential (primary) hypertension: Secondary | ICD-10-CM | POA: Diagnosis not present

## 2016-04-20 DIAGNOSIS — S2243XD Multiple fractures of ribs, bilateral, subsequent encounter for fracture with routine healing: Secondary | ICD-10-CM | POA: Diagnosis not present

## 2016-04-20 DIAGNOSIS — Z7982 Long term (current) use of aspirin: Secondary | ICD-10-CM | POA: Diagnosis not present

## 2016-04-20 DIAGNOSIS — E785 Hyperlipidemia, unspecified: Secondary | ICD-10-CM | POA: Diagnosis not present

## 2016-04-20 DIAGNOSIS — M6282 Rhabdomyolysis: Secondary | ICD-10-CM | POA: Diagnosis not present

## 2016-04-20 DIAGNOSIS — Z9181 History of falling: Secondary | ICD-10-CM | POA: Diagnosis not present

## 2016-04-20 DIAGNOSIS — E039 Hypothyroidism, unspecified: Secondary | ICD-10-CM | POA: Diagnosis not present

## 2016-04-20 MED ORDER — MECLIZINE HCL 12.5 MG PO TABS: 12.5000 mg | ORAL_TABLET | Freq: Three times a day (TID) | ORAL | 0 refills | Status: DC | PRN

## 2016-04-20 NOTE — Telephone Encounter (Signed)
Meclizine 12.5 mg 3 times a day as needed #20

## 2016-04-24 ENCOUNTER — Ambulatory Visit (INDEPENDENT_AMBULATORY_CARE_PROVIDER_SITE_OTHER): Payer: Medicare Other | Admitting: Family Medicine

## 2016-04-24 ENCOUNTER — Encounter: Payer: Self-pay | Admitting: Family Medicine

## 2016-04-24 VITALS — BP 122/72 | HR 80 | Temp 97.0°F | Ht 61.0 in | Wt 118.6 lb

## 2016-04-24 DIAGNOSIS — H8110 Benign paroxysmal vertigo, unspecified ear: Secondary | ICD-10-CM | POA: Diagnosis not present

## 2016-04-24 DIAGNOSIS — W19XXXD Unspecified fall, subsequent encounter: Secondary | ICD-10-CM

## 2016-04-24 NOTE — Progress Notes (Signed)
   HPI  Patient presents today here for nursing home follow up   Pt had a fall without obvious reason was hospitalized with bruised rinbs and then spent 6 week sin [facility].   As well, she feels much stronger but not quite back to baseline.  Planes of dizziness, with room spinning sensation for the last 3 weeks. The started after she sees some injections for macular degeneration in her eye. Her daughter states that there are many reports of vertigo after intraocular injections, she explains that there are multiple head positions The symptoms seem to happen every morning and last about 5 minutes, they occur just that she is getting up out of bed and she describes room spinning sensation which goes away after about 5 minutes.   She has some mild right flank pain which is not causing enough pain to take medications, she states this is from long-standing scoliosis.  PMH: Smoking status noted ROS: Per HPI  Objective: BP 122/72   Pulse 80   Temp 97 F (36.1 C) (Oral)   Ht 5\' 1"  (1.549 m)   Wt 118 lb 9.6 oz (53.8 kg)   BMI 22.41 kg/m  Gen: NAD, alert, cooperative with exam HEENT: NCAT CV: RRR, good S1/S2, no murmur Resp: CTABL, no wheezes, non-labored Ext: No edema, warm Neuro: Alert and oriented, No gross deficits  Assessment and plan:  # Benign positional vertigo Recommended using meclizine sparingly Discussed Epley's maneuvers and given a handout to try while she is lying in bed, consider vestibular rehabilitation if needed  # fall Unclear etiology, however she has undergone extensive strength training and gait training, she's currently using a walker with improved stability incontinence. Recommended continue using the walker Continue physical therapy, continue home health exercises after physical therapy has finished.    Laroy Apple, MD [facility] 04/24/2016, 3:15 PM

## 2016-04-24 NOTE — Patient Instructions (Addendum)
Great to see you!  Try Epley's maneuver's for dizziness, see the instructions I gave you   Benign Positional Vertigo Vertigo is the feeling that you or your surroundings are moving when they are not. Benign positional vertigo is the most common form of vertigo. The cause of this condition is not serious (is benign). This condition is triggered by certain movements and positions (is positional). This condition can be dangerous if it occurs while you are doing something that could endanger you or others, such as driving.  CAUSES In many cases, the cause of this condition is not known. It may be caused by a disturbance in an area of the inner ear that helps your brain to sense movement and balance. This disturbance can be caused by a viral infection (labyrinthitis), head injury, or repetitive motion. RISK FACTORS This condition is more likely to develop in:  Women.  People who are 110 years of age or older. SYMPTOMS Symptoms of this condition usually happen when you move your head or your eyes in different directions. Symptoms may start suddenly, and they usually last for less than a minute. Symptoms may include:  Loss of balance and falling.  Feeling like you are spinning or moving.  Feeling like your surroundings are spinning or moving.  Nausea and vomiting.  Blurred vision.  Dizziness.  Involuntary eye movement (nystagmus). Symptoms can be mild and cause only slight annoyance, or they can be severe and interfere with daily life. Episodes of benign positional vertigo may return (recur) over time, and they may be triggered by certain movements. Symptoms may improve over time. DIAGNOSIS This condition is usually diagnosed by medical history and a physical exam of the head, neck, and ears. You may be referred to a health care provider who specializes in ear, nose, and throat (ENT) problems (otolaryngologist) or a provider who specializes in disorders of the nervous system (neurologist).  You may have additional testing, including:  MRI.  A CT scan.  Eye movement tests. Your health care provider may ask you to change positions quickly while he or she watches you for symptoms of benign positional vertigo, such as nystagmus. Eye movement may be tested with an electronystagmogram (ENG), caloric stimulation, the Dix-Hallpike test, or the roll test.  An electroencephalogram (EEG). This records electrical activity in your brain.  Hearing tests. TREATMENT Usually, your health care provider will treat this by moving your head in specific positions to adjust your inner ear back to normal. Surgery may be needed in severe cases, but this is rare. In some cases, benign positional vertigo may resolve on its own in 2-4 weeks. HOME CARE INSTRUCTIONS Safety  Move slowly.Avoid sudden body or head movements.  Avoid driving.  Avoid operating heavy machinery.  Avoid doing any tasks that would be dangerous to you or others if a vertigo episode would occur.  If you have trouble walking or keeping your balance, try using a cane for stability. If you feel dizzy or unstable, sit down right away.  Return to your normal activities as told by your health care provider. Ask your health care provider what activities are safe for you. General Instructions  Take over-the-counter and prescription medicines only as told by your health care provider.  Avoid certain positions or movements as told by your health care provider.  Drink enough fluid to keep your urine clear or pale yellow.  Keep all follow-up visits as told by your health care provider. This is important. SEEK MEDICAL CARE IF:  You  have a fever.  Your condition gets worse or you develop new symptoms.  Your family or friends notice any behavioral changes.  Your nausea or vomiting gets worse.  You have numbness or a "pins and needles" sensation. SEEK IMMEDIATE MEDICAL CARE IF:  You have difficulty speaking or moving.  You  are always dizzy.  You faint.  You develop severe headaches.  You have weakness in your legs or arms.  You have changes in your hearing or vision.  You develop a stiff neck.  You develop sensitivity to light.   This information is not intended to replace advice given to you by your health care provider. Make sure you discuss any questions you have with your health care provider.   Document Released: 05/01/2006 Document Revised: 04/14/2015 Document Reviewed: 11/16/2014 Elsevier Interactive Patient Education [employer].

## 2016-04-25 DIAGNOSIS — Z7982 Long term (current) use of aspirin: Secondary | ICD-10-CM | POA: Diagnosis not present

## 2016-04-25 DIAGNOSIS — I10 Essential (primary) hypertension: Secondary | ICD-10-CM | POA: Diagnosis not present

## 2016-04-25 DIAGNOSIS — S2243XD Multiple fractures of ribs, bilateral, subsequent encounter for fracture with routine healing: Secondary | ICD-10-CM | POA: Diagnosis not present

## 2016-04-25 DIAGNOSIS — R296 Repeated falls: Secondary | ICD-10-CM | POA: Diagnosis not present

## 2016-04-25 DIAGNOSIS — E039 Hypothyroidism, unspecified: Secondary | ICD-10-CM | POA: Diagnosis not present

## 2016-04-25 DIAGNOSIS — Z9181 History of falling: Secondary | ICD-10-CM | POA: Diagnosis not present

## 2016-04-25 DIAGNOSIS — M150 Primary generalized (osteo)arthritis: Secondary | ICD-10-CM | POA: Diagnosis not present

## 2016-04-25 DIAGNOSIS — E785 Hyperlipidemia, unspecified: Secondary | ICD-10-CM | POA: Diagnosis not present

## 2016-04-25 DIAGNOSIS — M6282 Rhabdomyolysis: Secondary | ICD-10-CM | POA: Diagnosis not present

## 2016-04-26 ENCOUNTER — Telehealth: Payer: Self-pay | Admitting: Family Medicine

## 2016-04-26 DIAGNOSIS — E039 Hypothyroidism, unspecified: Secondary | ICD-10-CM | POA: Diagnosis not present

## 2016-04-26 DIAGNOSIS — M150 Primary generalized (osteo)arthritis: Secondary | ICD-10-CM | POA: Diagnosis not present

## 2016-04-26 DIAGNOSIS — Z7982 Long term (current) use of aspirin: Secondary | ICD-10-CM | POA: Diagnosis not present

## 2016-04-26 DIAGNOSIS — E785 Hyperlipidemia, unspecified: Secondary | ICD-10-CM | POA: Diagnosis not present

## 2016-04-26 DIAGNOSIS — R296 Repeated falls: Secondary | ICD-10-CM | POA: Diagnosis not present

## 2016-04-26 DIAGNOSIS — I10 Essential (primary) hypertension: Secondary | ICD-10-CM | POA: Diagnosis not present

## 2016-04-26 DIAGNOSIS — S2243XD Multiple fractures of ribs, bilateral, subsequent encounter for fracture with routine healing: Secondary | ICD-10-CM | POA: Diagnosis not present

## 2016-04-26 DIAGNOSIS — Z9181 History of falling: Secondary | ICD-10-CM | POA: Diagnosis not present

## 2016-04-26 DIAGNOSIS — M6282 Rhabdomyolysis: Secondary | ICD-10-CM | POA: Diagnosis not present

## 2016-04-27 DIAGNOSIS — E039 Hypothyroidism, unspecified: Secondary | ICD-10-CM | POA: Diagnosis not present

## 2016-04-27 DIAGNOSIS — Z9181 History of falling: Secondary | ICD-10-CM | POA: Diagnosis not present

## 2016-04-27 DIAGNOSIS — I10 Essential (primary) hypertension: Secondary | ICD-10-CM | POA: Diagnosis not present

## 2016-04-27 DIAGNOSIS — E785 Hyperlipidemia, unspecified: Secondary | ICD-10-CM | POA: Diagnosis not present

## 2016-04-27 DIAGNOSIS — M6282 Rhabdomyolysis: Secondary | ICD-10-CM | POA: Diagnosis not present

## 2016-04-27 DIAGNOSIS — S2243XD Multiple fractures of ribs, bilateral, subsequent encounter for fracture with routine healing: Secondary | ICD-10-CM | POA: Diagnosis not present

## 2016-04-27 DIAGNOSIS — M150 Primary generalized (osteo)arthritis: Secondary | ICD-10-CM | POA: Diagnosis not present

## 2016-04-27 DIAGNOSIS — R296 Repeated falls: Secondary | ICD-10-CM | POA: Diagnosis not present

## 2016-04-27 DIAGNOSIS — Z7982 Long term (current) use of aspirin: Secondary | ICD-10-CM | POA: Diagnosis not present

## 2016-04-28 DIAGNOSIS — M150 Primary generalized (osteo)arthritis: Secondary | ICD-10-CM | POA: Diagnosis not present

## 2016-04-28 DIAGNOSIS — S2243XD Multiple fractures of ribs, bilateral, subsequent encounter for fracture with routine healing: Secondary | ICD-10-CM | POA: Diagnosis not present

## 2016-04-28 DIAGNOSIS — Z9181 History of falling: Secondary | ICD-10-CM | POA: Diagnosis not present

## 2016-04-28 DIAGNOSIS — E039 Hypothyroidism, unspecified: Secondary | ICD-10-CM | POA: Diagnosis not present

## 2016-04-28 DIAGNOSIS — I10 Essential (primary) hypertension: Secondary | ICD-10-CM | POA: Diagnosis not present

## 2016-04-28 DIAGNOSIS — E785 Hyperlipidemia, unspecified: Secondary | ICD-10-CM | POA: Diagnosis not present

## 2016-04-28 DIAGNOSIS — Z7982 Long term (current) use of aspirin: Secondary | ICD-10-CM | POA: Diagnosis not present

## 2016-04-28 DIAGNOSIS — M6282 Rhabdomyolysis: Secondary | ICD-10-CM | POA: Diagnosis not present

## 2016-04-28 DIAGNOSIS — R296 Repeated falls: Secondary | ICD-10-CM | POA: Diagnosis not present

## 2016-05-02 DIAGNOSIS — E785 Hyperlipidemia, unspecified: Secondary | ICD-10-CM | POA: Diagnosis not present

## 2016-05-02 DIAGNOSIS — S2243XD Multiple fractures of ribs, bilateral, subsequent encounter for fracture with routine healing: Secondary | ICD-10-CM | POA: Diagnosis not present

## 2016-05-02 DIAGNOSIS — Z9181 History of falling: Secondary | ICD-10-CM | POA: Diagnosis not present

## 2016-05-02 DIAGNOSIS — E039 Hypothyroidism, unspecified: Secondary | ICD-10-CM | POA: Diagnosis not present

## 2016-05-02 DIAGNOSIS — I10 Essential (primary) hypertension: Secondary | ICD-10-CM | POA: Diagnosis not present

## 2016-05-02 DIAGNOSIS — M6282 Rhabdomyolysis: Secondary | ICD-10-CM | POA: Diagnosis not present

## 2016-05-02 DIAGNOSIS — R296 Repeated falls: Secondary | ICD-10-CM | POA: Diagnosis not present

## 2016-05-02 DIAGNOSIS — Z7982 Long term (current) use of aspirin: Secondary | ICD-10-CM | POA: Diagnosis not present

## 2016-05-02 DIAGNOSIS — M150 Primary generalized (osteo)arthritis: Secondary | ICD-10-CM | POA: Diagnosis not present

## 2016-05-04 DIAGNOSIS — E039 Hypothyroidism, unspecified: Secondary | ICD-10-CM | POA: Diagnosis not present

## 2016-05-04 DIAGNOSIS — R296 Repeated falls: Secondary | ICD-10-CM | POA: Diagnosis not present

## 2016-05-04 DIAGNOSIS — M150 Primary generalized (osteo)arthritis: Secondary | ICD-10-CM | POA: Diagnosis not present

## 2016-05-04 DIAGNOSIS — S2243XD Multiple fractures of ribs, bilateral, subsequent encounter for fracture with routine healing: Secondary | ICD-10-CM | POA: Diagnosis not present

## 2016-05-04 DIAGNOSIS — Z9181 History of falling: Secondary | ICD-10-CM | POA: Diagnosis not present

## 2016-05-04 DIAGNOSIS — E785 Hyperlipidemia, unspecified: Secondary | ICD-10-CM | POA: Diagnosis not present

## 2016-05-04 DIAGNOSIS — Z7982 Long term (current) use of aspirin: Secondary | ICD-10-CM | POA: Diagnosis not present

## 2016-05-04 DIAGNOSIS — I10 Essential (primary) hypertension: Secondary | ICD-10-CM | POA: Diagnosis not present

## 2016-05-04 DIAGNOSIS — M6282 Rhabdomyolysis: Secondary | ICD-10-CM | POA: Diagnosis not present

## 2016-05-06 ENCOUNTER — Ambulatory Visit (INDEPENDENT_AMBULATORY_CARE_PROVIDER_SITE_OTHER): Payer: Medicare Other | Admitting: Family Medicine

## 2016-05-06 DIAGNOSIS — M150 Primary generalized (osteo)arthritis: Secondary | ICD-10-CM | POA: Diagnosis not present

## 2016-05-06 DIAGNOSIS — S2243XD Multiple fractures of ribs, bilateral, subsequent encounter for fracture with routine healing: Secondary | ICD-10-CM

## 2016-05-06 DIAGNOSIS — R296 Repeated falls: Secondary | ICD-10-CM

## 2016-05-06 DIAGNOSIS — M6282 Rhabdomyolysis: Secondary | ICD-10-CM

## 2016-05-08 DIAGNOSIS — M6282 Rhabdomyolysis: Secondary | ICD-10-CM | POA: Diagnosis not present

## 2016-05-08 DIAGNOSIS — E785 Hyperlipidemia, unspecified: Secondary | ICD-10-CM | POA: Diagnosis not present

## 2016-05-08 DIAGNOSIS — E039 Hypothyroidism, unspecified: Secondary | ICD-10-CM | POA: Diagnosis not present

## 2016-05-08 DIAGNOSIS — Z9181 History of falling: Secondary | ICD-10-CM | POA: Diagnosis not present

## 2016-05-08 DIAGNOSIS — I10 Essential (primary) hypertension: Secondary | ICD-10-CM | POA: Diagnosis not present

## 2016-05-08 DIAGNOSIS — Z7982 Long term (current) use of aspirin: Secondary | ICD-10-CM | POA: Diagnosis not present

## 2016-05-08 DIAGNOSIS — S2243XD Multiple fractures of ribs, bilateral, subsequent encounter for fracture with routine healing: Secondary | ICD-10-CM | POA: Diagnosis not present

## 2016-05-08 DIAGNOSIS — R296 Repeated falls: Secondary | ICD-10-CM | POA: Diagnosis not present

## 2016-05-08 DIAGNOSIS — M150 Primary generalized (osteo)arthritis: Secondary | ICD-10-CM | POA: Diagnosis not present

## 2016-05-10 DIAGNOSIS — Z9181 History of falling: Secondary | ICD-10-CM | POA: Diagnosis not present

## 2016-05-10 DIAGNOSIS — S2243XD Multiple fractures of ribs, bilateral, subsequent encounter for fracture with routine healing: Secondary | ICD-10-CM | POA: Diagnosis not present

## 2016-05-10 DIAGNOSIS — M6282 Rhabdomyolysis: Secondary | ICD-10-CM | POA: Diagnosis not present

## 2016-05-10 DIAGNOSIS — I10 Essential (primary) hypertension: Secondary | ICD-10-CM | POA: Diagnosis not present

## 2016-05-10 DIAGNOSIS — R296 Repeated falls: Secondary | ICD-10-CM | POA: Diagnosis not present

## 2016-05-10 DIAGNOSIS — E785 Hyperlipidemia, unspecified: Secondary | ICD-10-CM | POA: Diagnosis not present

## 2016-05-10 DIAGNOSIS — M150 Primary generalized (osteo)arthritis: Secondary | ICD-10-CM | POA: Diagnosis not present

## 2016-05-10 DIAGNOSIS — Z7982 Long term (current) use of aspirin: Secondary | ICD-10-CM | POA: Diagnosis not present

## 2016-05-10 DIAGNOSIS — E039 Hypothyroidism, unspecified: Secondary | ICD-10-CM | POA: Diagnosis not present

## 2016-05-12 ENCOUNTER — Telehealth: Payer: Self-pay | Admitting: Family Medicine

## 2016-05-16 NOTE — Telephone Encounter (Signed)
Resent forms and notified [name].

## 2016-05-30 DIAGNOSIS — H353211 Exudative age-related macular degeneration, right eye, with active choroidal neovascularization: Secondary | ICD-10-CM | POA: Diagnosis not present

## 2016-05-30 DIAGNOSIS — H353221 Exudative age-related macular degeneration, left eye, with active choroidal neovascularization: Secondary | ICD-10-CM | POA: Diagnosis not present

## 2016-06-14 ENCOUNTER — Ambulatory Visit: Payer: Medicare Other | Admitting: Family Medicine

## 2016-06-14 ENCOUNTER — Encounter: Payer: Self-pay | Admitting: Family Medicine

## 2016-06-14 ENCOUNTER — Ambulatory Visit (INDEPENDENT_AMBULATORY_CARE_PROVIDER_SITE_OTHER): Payer: Medicare Other | Admitting: Family Medicine

## 2016-06-14 VITALS — BP 137/76 | HR 84 | Temp 97.0°F | Ht 61.0 in | Wt 115.0 lb

## 2016-06-14 DIAGNOSIS — I10 Essential (primary) hypertension: Secondary | ICD-10-CM | POA: Diagnosis not present

## 2016-06-14 DIAGNOSIS — Z23 Encounter for immunization: Secondary | ICD-10-CM | POA: Diagnosis not present

## 2016-06-14 DIAGNOSIS — R109 Unspecified abdominal pain: Secondary | ICD-10-CM | POA: Diagnosis not present

## 2016-06-14 DIAGNOSIS — E871 Hypo-osmolality and hyponatremia: Secondary | ICD-10-CM | POA: Diagnosis not present

## 2016-06-14 MED ORDER — DICLOFENAC SODIUM 1 % TD GEL: 2.0000 g | Freq: Four times a day (QID) | TRANSDERMAL | 3 refills | Status: DC

## 2016-06-14 NOTE — Patient Instructions (Signed)
Great to see you!  You may take up to 2 tylenol 3 times daily, Although I think 1 extra strength tylenol will likely help you significantly.   Lets plan on following up in 4 months like usual.

## 2016-06-14 NOTE — Progress Notes (Signed)
   HPI  Patient presents today here for four-month follow-up.  Patient claims that she feels well and only complains of right flank pain. States this is been a long-standing problem. Seems to be getting worse lately. Improves with aspirin or Tylenol. She takes one aspirin on Tylenol and has relief in about 30 minutes. She also has improvement with rest and sleeping. She states she has no pain early in the morning but states it slowly comes on in the afternoon hours and persist through the night.  It also is helped by heating pad.  Hypertension Medication compliance No chest pain or dyspnea. Long-standing left lower extremity edema is stable.   Hyponatremia Seen on last visit, he symptomatic   PMH: Smoking status noted ROS: Per HPI  Objective: BP 137/76   Pulse 84   Temp 97 F (36.1 C) (Oral)   Ht '5\' 1"'$  (1.549 m)   Wt 115 lb (52.2 kg)   BMI 21.73 kg/m  Gen: NAD, alert, cooperative with exam HEENT: NCAT CV: RRR, good S1/S2, no murmur Resp: CTABL, no wheezes, non-labored Ext: No edema, warm Neuro: Alert and oriented, No gross deficits  MSK Very small amount of space between her right iliac crest and right lower rib, no tenderness to palpation  Assessment and plan:  # Right flank pain Likely due to chronic postural change due to severe scoliosis Review Tylenol dosing, recommended taking it as needed. Avoid aspirin given age and already on daily aspirin. Trial of Voltaren gel, Aspercreme caused burning  # Hypertension Well controlled Continue Maxide\ Repeat BMP  # hyponatremia Mild Repeating labs     Orders Placed This Encounter  Procedures  . Flu vaccine HIGH DOSE PF  . BMP8+EGFR    Meds ordered this encounter  Medications  . diclofenac sodium (VOLTAREN) 1 % GEL    Sig: Apply 2 g topically 4 (four) times daily.    Dispense:  100 g    Refill:  [place], MD [place] 06/14/2016, 10:23 AM

## 2016-06-15 LAB — BMP8+EGFR
BUN / CREAT RATIO: 23 (ref 12–28)
BUN: 22 mg/dL (ref 10–36)
CO2: 27 mmol/L (ref 18–29)
CREATININE: 0.97 mg/dL (ref 0.57–1.00)
Calcium: 9.8 mg/dL (ref 8.7–10.3)
Chloride: 96 mmol/L (ref 96–106)
GFR, EST AFRICAN AMERICAN: 59 mL/min/{1.73_m2} — AB (ref >59)
GFR, EST NON AFRICAN AMERICAN: 51 mL/min/{1.73_m2} — AB (ref >59)
Glucose: 88 mg/dL (ref 65–99)
POTASSIUM: 4 mmol/L (ref 3.5–5.2)
SODIUM: 141 mmol/L (ref 134–144)

## 2016-06-26 ENCOUNTER — Ambulatory Visit: Payer: Medicare Other | Admitting: Family Medicine

## 2016-07-05 ENCOUNTER — Other Ambulatory Visit: Payer: Self-pay | Admitting: Family Medicine

## 2016-07-13 ENCOUNTER — Other Ambulatory Visit: Payer: Self-pay | Admitting: Family Medicine

## 2016-07-25 DIAGNOSIS — H353221 Exudative age-related macular degeneration, left eye, with active choroidal neovascularization: Secondary | ICD-10-CM | POA: Diagnosis not present

## 2016-07-25 DIAGNOSIS — H353211 Exudative age-related macular degeneration, right eye, with active choroidal neovascularization: Secondary | ICD-10-CM | POA: Diagnosis not present

## 2016-08-03 ENCOUNTER — Ambulatory Visit (INDEPENDENT_AMBULATORY_CARE_PROVIDER_SITE_OTHER): Payer: Medicare Other | Admitting: Family Medicine

## 2016-08-03 ENCOUNTER — Encounter: Payer: Self-pay | Admitting: Family Medicine

## 2016-08-03 VITALS — BP 147/79 | HR 74 | Temp 97.4°F | Ht 61.0 in | Wt 114.0 lb

## 2016-08-03 DIAGNOSIS — I10 Essential (primary) hypertension: Secondary | ICD-10-CM

## 2016-08-03 DIAGNOSIS — E559 Vitamin D deficiency, unspecified: Secondary | ICD-10-CM | POA: Diagnosis not present

## 2016-08-03 DIAGNOSIS — E7800 Pure hypercholesterolemia, unspecified: Secondary | ICD-10-CM

## 2016-08-03 NOTE — Patient Instructions (Addendum)
Medicare Annual Wellness Visit  [facility] and the medical providers at [facility] strive to bring you the best medical care.  In doing so we not only want to address your current medical conditions and concerns but also to detect new conditions early and prevent illness, disease and health-related problems.    Medicare offers a yearly Wellness Visit which allows our clinical staff to assess your need for preventative services including immunizations, lifestyle education, counseling to decrease risk of preventable diseases and screening for fall risk and other medical concerns.    This visit is provided free of charge (no copay) for all Medicare recipients. The clinical pharmacists at [facility] have begun to conduct these Wellness Visits which will also include a thorough review of all your medications.    As you primary medical provider recommend that you make an appointment for your Annual Wellness Visit if you have not done so already this year.  You may set up this appointment before you leave today or you may call back [phone] and schedule an appointment.  Please make sure when you call that you mention that you are scheduling your Annual Wellness Visit with the clinical pharmacist so that the appointment may be made for the proper length of time.     Continue current medications. Continue good therapeutic lifestyle changes which include good diet and exercise. Fall precautions discussed with patient. If an FOBT was given today- please return it to our front desk. If you are over 6 years old - you may need Prevnar 25 or the adult Pneumonia vaccine.  **Flu shots are available--- please call and schedule a FLU-CLINIC appointment**  After your visit with [place] today you will receive a survey in the mail or online from [employer] regarding your care with [place]. Please take a moment to fill this out. Your feedback is very  important to [place] as you can help [place] better understand your patient needs as well as improve your experience and satisfaction. WE CARE ABOUT YOU!!!   Try to get more active physically and strength in your body this way Try to get out and be with more friends Try to walk around the house more and strength in your lower extremities Always be careful and when arising from a sitting or laying position that you stand beside the chair or stand beside the bed before you start walking

## 2016-08-03 NOTE — Progress Notes (Signed)
Subjective:    Patient ID: Janice Reed, female    DOB: [birth year], 82 y.o.   MRN: [id number]  HPI Pt here for follow up and management of chronic medical problems which includes hyperlipidemia and hypertension. She is taking medications regularly.The patient today complains of some right-sided rib and hip pain. She is also not been taking any blood pressure medicines. Her blood pressure in the office today initially was 147/79. She will get traditional lab work while she is here today will be given an FOBT to return. The patient fell at her daughter's house this summer and fell on her right side and had a bruise to her side of the face as well as to her right side. She did end up in the emergency room and ultimately in the rehabilitation center where she could get stronger and she stayed there for about 6 weeks and did not come home until October. Since being at home since October she has gotten out very little and feels that she is confined a lot to the home. She feels that her daughter is overprotective and I told her that I understood this. She wants to have more independence. She does not want to live in an assisted living facility. We had a long discussion which involve most of the visit regarding her need to have more independence and still appreciating her daughter's concern about her condition. The agreement was that she would try to assert herself some more and using her walker improved to her daughter that she could get stronger without falling. She will try to make efforts to get out and be with her friends more and have her daughter take her to those meeting opportunities. Hopefully as she strengthens herself and lose more she will hurt less and the last EF and she can graduate to a cane instead of a walker. She denies any specific physical complaints like chest pain or shortness of breath or problems with her GI tract or problems with voiding. She was very alert and was very in continued to her  body.     Patient Active Problem List   Diagnosis Date Noted  . Benign essential HTN 05/22/2014  . Age-related macular degeneration, wet, both eyes ([facility]) 03/12/2014  . Internal hemorrhoids without mention of complication 31/49/7026  . Immune fecal occult blood test + x 2 01/05/2012  . Cancer of right breast ([facility]) 09/28/2011  . Hearing loss 12/27/2010  . Arthritis 12/27/2010  . Hyperlipidemia   . Phlebitis and thrombophlebitis of unspecified site   . Symptomatic menopausal or female climacteric states   . Malaise and fatigue   . Palpitations   . Nonspecific abnormal electrocardiogram (ECG) (EKG)   . Hypothyroidism   . Osteoporosis   . IBS (irritable bowel syndrome)    Outpatient Encounter Prescriptions as of 08/03/2016  Medication Sig  . Aflibercept (EYLEA IO) Inject into the eye every 30 (thirty) days.   [name] Kitchen aspirin 325 MG tablet Take 325 mg by mouth See admin instructions. Take one tablet a day and more if needed for pain - up to 3 times daily  . bevacizumab (AVASTIN) 1.25 mg/0.1 mL SOLN 1.25 mg by Intravitreal route every 30 (thirty) days.   . Calcium-Magnesium-Vitamin D (CITRACAL CALCIUM+D PO) Take 1 capsule by mouth 2 (two) times daily.    . Cholecalciferol (VITAMIN D3) 1000 UNITS CAPS Take 1,000 Units by mouth 2 (two) times daily.   [name] Kitchen levothyroxine (SYNTHROID, LEVOTHROID) 100 MCG tablet TAKE 1 TABLET BY MOUTH  EVERY DAY  . levothyroxine (SYNTHROID, LEVOTHROID) 25 MCG tablet TAKE 1 TABLET (25 MCG TOTAL) BY MOUTH DAILY BEFORE BREAKFAST. AS DIRECTED  . Multiple Vitamins-Minerals (ICAPS AREDS FORMULA PO) Take 1 capsule by mouth 2 (two) times daily.   . Omega-3 Fatty Acids (FISH OIL) 1000 MG CAPS Take 1,000 mg by mouth 2 (two) times daily.   . risedronate (ACTONEL) 35 MG tablet TAKE 1 TABLET BY MOUTH  EVERY 7 DAYS WITH WATER ON  EMPTY STOMACH, NOTHING BY  MOUTH OR LIE DOWN FOR NEXT  30 MINUTES.  [name] Kitchen triamcinolone cream (KENALOG) 0.1 % Reported on 12/08/2015  .  triamterene-hydrochlorothiazide (MAXZIDE-25) 37.5-25 MG tablet Take one-half tablet by  mouth daily  . [DISCONTINUED] diclofenac sodium (VOLTAREN) 1 % GEL Apply 2 g topically 4 (four) times daily.   No facility-administered encounter medications on file as of 08/03/2016.       Review of Systems  Constitutional: Negative.   HENT: Negative.   Eyes: Negative.   Respiratory: Negative.   Cardiovascular: Negative.   Gastrointestinal: Negative.   Endocrine: Negative.   Genitourinary: Negative.   Musculoskeletal: Positive for arthralgias (right hip and rib pain).  Skin: Negative.   Allergic/Immunologic: Negative.   Neurological: Negative.   Hematological: Negative.   Psychiatric/Behavioral: Negative.        Objective:   Physical Exam  Constitutional: She is oriented to person, place, and time. No distress.  Elderly and alert with a thin body habitus.  HENT:  Head: Normocephalic and atraumatic.  Right Ear: External ear normal.  Left Ear: External ear normal.  Nose: Nose normal.  Mouth/Throat: Oropharynx is clear and moist. No oropharyngeal exudate.  Eyes: Conjunctivae and EOM are normal. Pupils are equal, round, and reactive to light. Right eye exhibits no discharge. Left eye exhibits no discharge. No scleral icterus.  Neck: Normal range of motion. Neck supple. No thyromegaly present.  No bruits or thyromegaly  Cardiovascular: Normal rate, regular rhythm and normal heart sounds.   No murmur heard. The heart was regular at 72/m  Pulmonary/Chest: Effort normal and breath sounds normal. No respiratory distress. She has no wheezes. She has no rales.  Clear anteriorly and posteriorly  Abdominal: Soft. Bowel sounds are normal. She exhibits no mass. There is no tenderness. There is no rebound and no guarding.  No abdominal tenderness or masses were detected  Musculoskeletal: She exhibits edema. She exhibits no tenderness.  There is some pedal edema on the right side.  Lymphadenopathy:      She has no cervical adenopathy.  Neurological: She is alert and oriented to person, place, and time. She has normal reflexes. No cranial nerve deficit.  Skin: Skin is warm and dry. No rash noted.  Psychiatric: She has a normal mood and affect. Her behavior is normal. Judgment and thought content normal.  Nursing note and vitals reviewed.  BP (!) 147/79 (BP Location: Left Arm)   Pulse 74   Temp 97.4 F (36.3 C) (Oral)   Ht '5\' 1"'  (1.549 m)   Wt 114 lb (51.7 kg)   BMI 21.54 kg/m         Assessment & Plan:  1. Benign essential HTN -The blood pressure is elevated today. The patient says that her blood pressures at home run in the 130s over the 60-70 range and she will bring back readings for review. We will not ask her to restart any of her medicines at this time. She lives by herself and the chance for hypotension could occur and  this could cause her to fall. - BMP8+EGFR - CBC with Differential/Platelet - Hepatic function panel  2. Pure hypercholesterolemia -Continue current treatment and aggressive therapeutic lifestyle changes pending results of lab work - CBC with Differential/Platelet - Lipid panel  3. Vitamin D deficiency -Continue current treatment pending results of lab work - CBC with Differential/Platelet - VITAMIN D 25 Hydroxy (Vit-D Deficiency, Fractures)  Patient Instructions                       Medicare Annual Wellness Visit  [facility] and the medical providers at [facility] strive to bring you the best medical care.  In doing so we not only want to address your current medical conditions and concerns but also to detect new conditions early and prevent illness, disease and health-related problems.    Medicare offers a yearly Wellness Visit which allows our clinical staff to assess your need for preventative services including immunizations, lifestyle education, counseling to decrease risk of preventable diseases and screening for fall  risk and other medical concerns.    This visit is provided free of charge (no copay) for all Medicare recipients. The clinical pharmacists at [facility] have begun to conduct these Wellness Visits which will also include a thorough review of all your medications.    As you primary medical provider recommend that you make an appointment for your Annual Wellness Visit if you have not done so already this year.  You may set up this appointment before you leave today or you may call back ([phone]) and schedule an appointment.  Please make sure when you call that you mention that you are scheduling your Annual Wellness Visit with the clinical pharmacist so that the appointment may be made for the proper length of time.     Continue current medications. Continue good therapeutic lifestyle changes which include good diet and exercise. Fall precautions discussed with patient. If an FOBT was given today- please return it to our front desk. If you are over 10 years old - you may need Prevnar 47 or the adult Pneumonia vaccine.  **Flu shots are available--- please call and schedule a FLU-CLINIC appointment**  After your visit with [place] today you will receive a survey in the mail or online from [employer] regarding your care with [place]. Please take a moment to fill this out. Your feedback is very important to [place] as you can help [place] better understand your patient needs as well as improve your experience and satisfaction. WE CARE ABOUT YOU!!!   Try to get more active physically and strength in your body this way Try to get out and be with more friends Try to walk around the house more and strength in your lower extremities Always be careful and when arising from a sitting or laying position that you stand beside the chair or stand beside the bed before you start walking    Arrie Senate MD

## 2016-08-04 LAB — BMP8+EGFR
BUN/Creatinine Ratio: 27 (ref 12–28)
BUN: 22 mg/dL (ref 10–36)
CO2: 29 mmol/L (ref 18–29)
CREATININE: 0.81 mg/dL (ref 0.57–1.00)
Calcium: 10.5 mg/dL — ABNORMAL HIGH (ref 8.7–10.3)
Chloride: 94 mmol/L — ABNORMAL LOW (ref 96–106)
GFR calc Af Amer: 73 mL/min/{1.73_m2} (ref >59)
GFR, EST NON AFRICAN AMERICAN: 63 mL/min/{1.73_m2} (ref >59)
GLUCOSE: 98 mg/dL (ref 65–99)
Potassium: 4.5 mmol/L (ref 3.5–5.2)
Sodium: 141 mmol/L (ref 134–144)

## 2016-08-04 LAB — CBC WITH DIFFERENTIAL/PLATELET
BASOS: 3 %
Basophils Absolute: 0.2 10*3/uL (ref 0.0–0.2)
EOS (ABSOLUTE): 0.2 10*3/uL (ref 0.0–0.4)
EOS: 4 %
HEMATOCRIT: 46.2 % (ref 34.0–46.6)
HEMOGLOBIN: 15.6 g/dL (ref 11.1–15.9)
IMMATURE GRANS (ABS): 0 10*3/uL (ref 0.0–0.1)
Immature Granulocytes: 0 %
Lymphocytes Absolute: 1.8 10*3/uL (ref 0.7–3.1)
Lymphs: 29 %
MCH: 31 pg (ref 26.6–33.0)
MCHC: 33.8 g/dL (ref 31.5–35.7)
MCV: 92 fL (ref 79–97)
MONOCYTES: 11 %
Monocytes Absolute: 0.7 10*3/uL (ref 0.1–0.9)
NEUTROS PCT: 53 %
Neutrophils Absolute: 3.3 10*3/uL (ref 1.4–7.0)
Platelets: 217 10*3/uL (ref 150–379)
RBC: 5.03 x10E6/uL (ref 3.77–5.28)
RDW: 14.3 % (ref 12.3–15.4)
WBC: 6.1 10*3/uL (ref 3.4–10.8)

## 2016-08-04 LAB — LIPID PANEL
CHOL/HDL RATIO: 3 ratio (ref 0.0–4.4)
Cholesterol, Total: 265 mg/dL — ABNORMAL HIGH (ref 100–199)
HDL: 88 mg/dL (ref >39)
LDL CALC: 161 mg/dL — AB (ref 0–99)
TRIGLYCERIDES: 79 mg/dL (ref 0–149)
VLDL Cholesterol Cal: 16 mg/dL (ref 5–40)

## 2016-08-04 LAB — HEPATIC FUNCTION PANEL
ALBUMIN: 4.2 g/dL (ref 3.2–4.6)
ALK PHOS: 106 IU/L (ref 39–117)
ALT: 15 IU/L (ref 0–32)
AST: 23 IU/L (ref 0–40)
BILIRUBIN, DIRECT: 0.12 mg/dL (ref 0.00–0.40)
Bilirubin Total: 0.4 mg/dL (ref 0.0–1.2)
Total Protein: 7.2 g/dL (ref 6.0–8.5)

## 2016-08-04 LAB — VITAMIN D 25 HYDROXY (VIT D DEFICIENCY, FRACTURES): Vit D, 25-Hydroxy: 55.1 ng/mL (ref 30.0–100.0)

## 2016-09-15 ENCOUNTER — Ambulatory Visit: Payer: Medicare Other | Admitting: Family Medicine

## 2016-09-19 DIAGNOSIS — H353211 Exudative age-related macular degeneration, right eye, with active choroidal neovascularization: Secondary | ICD-10-CM | POA: Diagnosis not present

## 2016-09-19 DIAGNOSIS — H353221 Exudative age-related macular degeneration, left eye, with active choroidal neovascularization: Secondary | ICD-10-CM | POA: Diagnosis not present

## 2016-09-26 DIAGNOSIS — B351 Tinea unguium: Secondary | ICD-10-CM | POA: Diagnosis not present

## 2016-09-26 DIAGNOSIS — M79676 Pain in unspecified toe(s): Secondary | ICD-10-CM | POA: Diagnosis not present

## 2016-10-02 ENCOUNTER — Other Ambulatory Visit: Payer: Self-pay | Admitting: *Deleted

## 2016-10-02 MED ORDER — LEVOTHYROXINE SODIUM 25 MCG PO TABS: ORAL_TABLET | ORAL | 3 refills | Status: DC

## 2016-10-09 ENCOUNTER — Telehealth: Payer: Self-pay | Admitting: Family Medicine

## 2016-10-09 MED ORDER — LEVOTHYROXINE SODIUM 100 MCG PO TABS: 100.0000 ug | ORAL_TABLET | Freq: Every day | ORAL | 1 refills | Status: DC

## 2016-10-09 MED ORDER — LEVOTHYROXINE SODIUM 25 MCG PO TABS: ORAL_TABLET | ORAL | 1 refills | Status: DC

## 2016-10-09 NOTE — Telephone Encounter (Signed)
Refills sent and pt aware

## 2016-10-31 DIAGNOSIS — H353211 Exudative age-related macular degeneration, right eye, with active choroidal neovascularization: Secondary | ICD-10-CM | POA: Diagnosis not present

## 2016-10-31 DIAGNOSIS — H353221 Exudative age-related macular degeneration, left eye, with active choroidal neovascularization: Secondary | ICD-10-CM | POA: Diagnosis not present

## 2016-11-06 ENCOUNTER — Ambulatory Visit: Payer: Medicare Other | Admitting: Pharmacist

## 2016-11-07 ENCOUNTER — Encounter: Payer: Self-pay | Admitting: Family Medicine

## 2016-11-09 ENCOUNTER — Ambulatory Visit (INDEPENDENT_AMBULATORY_CARE_PROVIDER_SITE_OTHER): Payer: Medicare Other

## 2016-11-09 ENCOUNTER — Encounter: Payer: Self-pay | Admitting: Family Medicine

## 2016-11-09 ENCOUNTER — Ambulatory Visit (INDEPENDENT_AMBULATORY_CARE_PROVIDER_SITE_OTHER): Payer: Medicare Other | Admitting: Family Medicine

## 2016-11-09 ENCOUNTER — Ambulatory Visit: Payer: Medicare Other | Admitting: Family Medicine

## 2016-11-09 VITALS — BP 120/80 | HR 76 | Temp 97.9°F | Ht 61.0 in | Wt 119.0 lb

## 2016-11-09 DIAGNOSIS — R413 Other amnesia: Secondary | ICD-10-CM

## 2016-11-09 DIAGNOSIS — Z13820 Encounter for screening for osteoporosis: Secondary | ICD-10-CM | POA: Diagnosis not present

## 2016-11-09 DIAGNOSIS — Z780 Asymptomatic menopausal state: Secondary | ICD-10-CM | POA: Diagnosis not present

## 2016-11-09 DIAGNOSIS — R29898 Other symptoms and signs involving the musculoskeletal system: Secondary | ICD-10-CM

## 2016-11-09 DIAGNOSIS — I10 Essential (primary) hypertension: Secondary | ICD-10-CM | POA: Diagnosis not present

## 2016-11-09 DIAGNOSIS — R2681 Unsteadiness on feet: Secondary | ICD-10-CM | POA: Diagnosis not present

## 2016-11-09 DIAGNOSIS — E559 Vitamin D deficiency, unspecified: Secondary | ICD-10-CM | POA: Diagnosis not present

## 2016-11-09 DIAGNOSIS — E7800 Pure hypercholesterolemia, unspecified: Secondary | ICD-10-CM | POA: Diagnosis not present

## 2016-11-09 DIAGNOSIS — E871 Hypo-osmolality and hyponatremia: Secondary | ICD-10-CM

## 2016-11-09 MED ORDER — DONEPEZIL HCL 5 MG PO TABS: 5.0000 mg | ORAL_TABLET | Freq: Every day | ORAL | 1 refills | Status: DC

## 2016-11-09 NOTE — Patient Instructions (Addendum)
Medicare Annual Wellness Visit  [facility] and the medical providers at [facility] strive to bring you the best medical care.  In doing so we not only want to address your current medical conditions and concerns but also to detect new conditions early and prevent illness, disease and health-related problems.    Medicare offers a yearly Wellness Visit which allows our clinical staff to assess your need for preventative services including immunizations, lifestyle education, counseling to decrease risk of preventable diseases and screening for fall risk and other medical concerns.    This visit is provided free of charge (no copay) for all Medicare recipients. The clinical pharmacists at [facility] have begun to conduct these Wellness Visits which will also include a thorough review of all your medications.    As you primary medical provider recommend that you make an appointment for your Annual Wellness Visit if you have not done so already this year.  You may set up this appointment before you leave today or you may call back ([phone]) and schedule an appointment.  Please make sure when you call that you mention that you are scheduling your Annual Wellness Visit with the clinical pharmacist so that the appointment may be made for the proper length of time.     Continue current medications. Continue good therapeutic lifestyle changes which include good diet and exercise. Fall precautions discussed with patient. If an FOBT was given today- please return it to our front desk. If you are over 44 years old - you may need Prevnar 76 or the adult Pneumonia vaccine.  **Flu shots are available--- please call and schedule a FLU-CLINIC appointment**  After your visit with [place] today you will receive a survey in the mail or online from [employer] regarding your care with [place]. Please take a moment to fill this out. Your feedback is very  important to [place] as you can help [place] better understand your patient needs as well as improve your experience and satisfaction. WE CARE ABOUT YOU!!!   Continue to be careful do not put yourself at risk for falling Use a support device when walking like a walker or cane. Return the FOBT We will call with results of lab work as soon as those results become available. Elevate the legs above the level of the heart a couple times a day for 20 minutes Continue to wear good support hose and put these on the first thing with arising in the morning Use the walker at all times I would recommend avoiding driving. You do not need to take the chance that you might fall and hurt yourself.

## 2016-11-09 NOTE — Progress Notes (Signed)
Subjective:    Patient ID: Janice Reed, female    DOB: [birth year], 83 y.o.   MRN: [id number]  HPI Pt here for follow up and management of chronic medical problems which includes hyperlipidemia and hypertension. She is taking medication regularly.The patient today complains of her joints being sore and left lower extremity swelling. She does have some urinary incontinence. She also complains of numbness in her hands and feet. She has a history of hypertension and peripheral neuropathy. She also has a remote history of thrombophlebitis in her lower extremities. She is requesting refills of all her medicines except for her thyroid medicine. She is due to get a DEXA scan to return an FOBT and she will get lab work today. The patient does bring in outside blood pressures for review and overall these are running on the average between 117 up to 138 over the 60s and as low as the 50s. The patient is asking about medicine for her memory. She seems to be fairly alert today but we can certainly try this and she is bringing the subject up. Her weight is up 5 more pounds than it was at the last visit in late December. She has not been very active as mentioned and she sits around the house a lot during the day. She denies any chest pain or shortness of breath. She denies any trouble with swallowing heartburn indigestion nausea vomiting diarrhea or blood in the stool. She does have some constipation. She does have some urinary incontinence but no burning or frequency.    Patient Active Problem List   Diagnosis Date Noted  . Benign essential HTN 05/22/2014  . Age-related macular degeneration, wet, both eyes ([place]) 03/12/2014  . Internal hemorrhoids without mention of complication 51/88/4166  . Immune fecal occult blood test + x 2 01/05/2012  . Cancer of right breast ([place]) 09/28/2011  . Hearing loss 12/27/2010  . Arthritis 12/27/2010  . Hyperlipidemia   . Phlebitis and thrombophlebitis of unspecified site   .  Symptomatic menopausal or female climacteric states   . Malaise and fatigue   . Palpitations   . Nonspecific abnormal electrocardiogram (ECG) (EKG)   . Hypothyroidism   . Osteoporosis   . IBS (irritable bowel syndrome)    Outpatient Encounter Prescriptions as of 11/09/2016  Medication Sig  . Aflibercept (EYLEA IO) Inject into the eye every 30 (thirty) days.   [name] Kitchen aspirin 325 MG tablet Take 325 mg by mouth See admin instructions. Take one tablet a day and more if needed for pain - up to 3 times daily  . bevacizumab (AVASTIN) 1.25 mg/0.1 mL SOLN 1.25 mg by Intravitreal route every 30 (thirty) days.   . Calcium-Magnesium-Vitamin D (CITRACAL CALCIUM+D PO) Take 1 capsule by mouth 2 (two) times daily.    . Cholecalciferol (VITAMIN D3) 1000 UNITS CAPS Take 1,000 Units by mouth 2 (two) times daily.   [name] Kitchen levothyroxine (SYNTHROID, LEVOTHROID) 100 MCG tablet Take 1 tablet (100 mcg total) by mouth daily.  [name] Kitchen levothyroxine (SYNTHROID, LEVOTHROID) 25 MCG tablet TAKE 1 TABLET (25 MCG TOTAL) BY MOUTH DAILY BEFORE BREAKFAST. AS DIRECTED  . Multiple Vitamins-Minerals (ICAPS AREDS FORMULA PO) Take 1 capsule by mouth 2 (two) times daily.   . Omega-3 Fatty Acids (FISH OIL) 1000 MG CAPS Take 1,000 mg by mouth 2 (two) times daily.   . risedronate (ACTONEL) 35 MG tablet TAKE 1 TABLET BY MOUTH  EVERY 7 DAYS WITH WATER ON  EMPTY STOMACH, NOTHING BY  MOUTH OR LIE  DOWN FOR NEXT  30 MINUTES.  [name] Kitchen triamcinolone cream (KENALOG) 0.1 % Reported on 12/08/2015  . triamterene-hydrochlorothiazide (MAXZIDE-25) 37.5-25 MG tablet Take one-half tablet by  mouth daily   No facility-administered encounter medications on file as of 11/09/2016.       Review of Systems  Constitutional: Negative.   HENT: Negative.   Eyes: Negative.   Respiratory: Negative.   Cardiovascular: Positive for leg swelling (left lower extrem edema).  Gastrointestinal: Negative.   Endocrine: Negative.   Genitourinary: Negative.        Some urinary incont.     Musculoskeletal: Positive for arthralgias (joint pain all over).  Skin: Negative.   Allergic/Immunologic: Negative.   Neurological: Positive for numbness (in hands and feet).  Hematological: Negative.   Psychiatric/Behavioral: Negative.        Objective:   Physical Exam  Constitutional: She is oriented to person, place, and time. She appears well-developed and well-nourished. No distress.  Patient is elderly with a headache kyphotic and scoliotic I posture. She is pleasant and alert and looks younger than her stated age other than her been over structure.  HENT:  Head: Normocephalic and atraumatic.  Right Ear: External ear normal.  Left Ear: External ear normal.  Nose: Nose normal.  Mouth/Throat: Oropharynx is clear and moist.  Eyes: Conjunctivae and EOM are normal. Pupils are equal, round, and reactive to light. Right eye exhibits no discharge. Left eye exhibits no discharge. No scleral icterus.  Neck: Normal range of motion. Neck supple. No thyromegaly present.  No bruits or thyromegaly  Cardiovascular: Normal rate, regular rhythm, normal heart sounds and intact distal pulses.   No murmur heard. Heart has a regular rate and rhythm at 76/m  Pulmonary/Chest: Effort normal and breath sounds normal. No respiratory distress. She has no wheezes. She has no rales.  Clear anteriorly and posteriorly  Abdominal: Soft. Bowel sounds are normal. She exhibits no mass. There is no tenderness. There is no rebound and no guarding.  No abdominal tenderness masses or bruits.  Musculoskeletal: She exhibits edema. She exhibits no tenderness.  The patient has a somewhat unstable gait due to her posture. She does have edema in both legs and feet with the left being worse than the right.  Lymphadenopathy:    She has no cervical adenopathy.  Neurological: She is alert and oriented to person, place, and time. She has normal reflexes. No cranial nerve deficit.  Skin: Skin is warm and dry. No rash noted.   Psychiatric: She has a normal mood and affect. Her behavior is normal. Judgment and thought content normal.  The patient's memory appears to be pretty good and she was able to carry on an intelligent conversation and asked appropriate questions. She is concerned about her memory and we will start Aricept for this.  Nursing note and vitals reviewed.  BP 120/80 (BP Location: Left Arm)   Pulse 76   Temp 97.9 F (36.6 C) (Oral)   Ht _0  (1.549 m)   Wt 119 lb (54 kg)   BMI 22.48 kg/m        Assessment & Plan:  1. Pure hypercholesterolemia -Continue with as aggressive therapeutic lifestyle changes as possible including omega-3 fatty acids. - CBC with Differential/Platelet - Lipid panel  2. Benign essential HTN -Continue with current blood pressure medicine -Elevate the feet a couple times daily for 20 minutes high or than the level of the heart and continue to wear support hose - CBC with Differential/Platelet - BMP8+EGFR - Hepatic  function panel  3. Vitamin D deficiency -Continue current treatment pending results of lab work - CBC with Differential/Platelet - VITAMIN D 25 Hydroxy (Vit-D Deficiency, Fractures) - DG [facility] DEXA; Future  4. Gait instability -Use the walker regularly - CBC with Differential/Platelet  5. Screening for osteoporosis - DG [facility] DEXA; Future  6. Hyponatremia  7. Weakness of both legs -The patient has an ongoing peripheral neuropathy most likely coming from her back. - Thyroid Panel With TSH - Vitamin B12  8. Memory impairment -This was brought up by the patient and we will start Aricept 5 mg daily  Meds ordered this encounter  Medications  . donepezil (ARICEPT) 5 MG tablet    Sig: Take 1 tablet (5 mg total) by mouth at bedtime.    Dispense:  90 tablet    Refill:  1   Patient Instructions                       Medicare Annual Wellness Visit  [facility] and the medical providers at [facility] strive to bring you  the best medical care.  In doing so we not only want to address your current medical conditions and concerns but also to detect new conditions early and prevent illness, disease and health-related problems.    Medicare offers a yearly Wellness Visit which allows our clinical staff to assess your need for preventative services including immunizations, lifestyle education, counseling to decrease risk of preventable diseases and screening for fall risk and other medical concerns.    This visit is provided free of charge (no copay) for all Medicare recipients. The clinical pharmacists at [facility] have begun to conduct these Wellness Visits which will also include a thorough review of all your medications.    As you primary medical provider recommend that you make an appointment for your Annual Wellness Visit if you have not done so already this year.  You may set up this appointment before you leave today or you may call back ([phone]) and schedule an appointment.  Please make sure when you call that you mention that you are scheduling your Annual Wellness Visit with the clinical pharmacist so that the appointment may be made for the proper length of time.     Continue current medications. Continue good therapeutic lifestyle changes which include good diet and exercise. Fall precautions discussed with patient. If an FOBT was given today- please return it to our front desk. If you are over 28 years old - you may need Prevnar 55 or the adult Pneumonia vaccine.  **Flu shots are available--- please call and schedule a FLU-CLINIC appointment**  After your visit with [place] today you will receive a survey in the mail or online from [employer] regarding your care with [place]. Please take a moment to fill this out. Your feedback is very important to [place] as you can help [place] better understand your patient needs as well as improve your experience and satisfaction. WE CARE ABOUT YOU!!!    Continue to be careful do not put yourself at risk for falling Use a support device when walking like a walker or cane. Return the FOBT We will call with results of lab work as soon as those results become available. Elevate the legs above the level of the heart a couple times a day for 20 minutes Continue to wear good support hose and put these on the first thing with arising in the morning Use  the walker at all times I would recommend avoiding driving. You do not need to take the chance that you might fall and hurt yourself.  Arrie Senate MD

## 2016-11-10 LAB — BMP8+EGFR
BUN / CREAT RATIO: 27 (ref 12–28)
BUN: 24 mg/dL (ref 10–36)
CO2: 30 mmol/L — ABNORMAL HIGH (ref 18–29)
CREATININE: 0.88 mg/dL (ref 0.57–1.00)
Calcium: 9.6 mg/dL (ref 8.7–10.3)
Chloride: 97 mmol/L (ref 96–106)
GFR, EST AFRICAN AMERICAN: 66 mL/min/{1.73_m2} (ref >59)
GFR, EST NON AFRICAN AMERICAN: 57 mL/min/{1.73_m2} — AB (ref >59)
Glucose: 84 mg/dL (ref 65–99)
Potassium: 4.2 mmol/L (ref 3.5–5.2)
Sodium: 141 mmol/L (ref 134–144)

## 2016-11-10 LAB — THYROID PANEL WITH TSH
FREE THYROXINE INDEX: 3.7 (ref 1.2–4.9)
T3 Uptake Ratio: 32 % (ref 24–39)
T4 TOTAL: 11.7 ug/dL (ref 4.5–12.0)
TSH: 0.31 u[IU]/mL — AB (ref 0.450–4.500)

## 2016-11-10 LAB — CBC WITH DIFFERENTIAL/PLATELET
BASOS: 2 %
Basophils Absolute: 0.2 10*3/uL (ref 0.0–0.2)
EOS (ABSOLUTE): 0.3 10*3/uL (ref 0.0–0.4)
Eos: 4 %
HEMATOCRIT: 44.3 % (ref 34.0–46.6)
HEMOGLOBIN: 15.3 g/dL (ref 11.1–15.9)
IMMATURE GRANS (ABS): 0 10*3/uL (ref 0.0–0.1)
Immature Granulocytes: 0 %
LYMPHS: 29 %
Lymphocytes Absolute: 2 10*3/uL (ref 0.7–3.1)
MCH: 31.3 pg (ref 26.6–33.0)
MCHC: 34.5 g/dL (ref 31.5–35.7)
MCV: 91 fL (ref 79–97)
Monocytes Absolute: 0.9 10*3/uL (ref 0.1–0.9)
Monocytes: 12 %
NEUTROS ABS: 3.7 10*3/uL (ref 1.4–7.0)
Neutrophils: 53 %
PLATELETS: 209 10*3/uL (ref 150–379)
RBC: 4.89 x10E6/uL (ref 3.77–5.28)
RDW: 13.8 % (ref 12.3–15.4)
WBC: 7 10*3/uL (ref 3.4–10.8)

## 2016-11-10 LAB — HEPATIC FUNCTION PANEL
ALK PHOS: 104 IU/L (ref 39–117)
ALT: 12 IU/L (ref 0–32)
AST: 17 IU/L (ref 0–40)
Albumin: 4.2 g/dL (ref 3.2–4.6)
BILIRUBIN, DIRECT: 0.12 mg/dL (ref 0.00–0.40)
Bilirubin Total: 0.3 mg/dL (ref 0.0–1.2)
Total Protein: 6.8 g/dL (ref 6.0–8.5)

## 2016-11-10 LAB — VITAMIN D 25 HYDROXY (VIT D DEFICIENCY, FRACTURES): VIT D 25 HYDROXY: 46.7 ng/mL (ref 30.0–100.0)

## 2016-11-10 LAB — LIPID PANEL
CHOL/HDL RATIO: 3 ratio (ref 0.0–4.4)
Cholesterol, Total: 248 mg/dL — ABNORMAL HIGH (ref 100–199)
HDL: 84 mg/dL (ref >39)
LDL Calculated: 147 mg/dL — ABNORMAL HIGH (ref 0–99)
Triglycerides: 83 mg/dL (ref 0–149)
VLDL Cholesterol Cal: 17 mg/dL (ref 5–40)

## 2016-11-10 LAB — VITAMIN B12: VITAMIN B 12: 383 pg/mL (ref 232–1245)

## 2016-11-14 DIAGNOSIS — H353221 Exudative age-related macular degeneration, left eye, with active choroidal neovascularization: Secondary | ICD-10-CM | POA: Diagnosis not present

## 2016-11-14 DIAGNOSIS — H353211 Exudative age-related macular degeneration, right eye, with active choroidal neovascularization: Secondary | ICD-10-CM | POA: Diagnosis not present

## 2016-11-16 ENCOUNTER — Telehealth: Payer: Self-pay | Admitting: Family Medicine

## 2016-11-16 NOTE — Telephone Encounter (Signed)
Pt aware of results 

## 2016-11-29 ENCOUNTER — Ambulatory Visit (INDEPENDENT_AMBULATORY_CARE_PROVIDER_SITE_OTHER): Payer: Medicare Other | Admitting: Pharmacist

## 2016-11-29 ENCOUNTER — Encounter: Payer: Self-pay | Admitting: Pharmacist

## 2016-11-29 VITALS — BP 124/78 | HR 78 | Ht 61.0 in | Wt 120.0 lb

## 2016-11-29 DIAGNOSIS — R29898 Other symptoms and signs involving the musculoskeletal system: Secondary | ICD-10-CM

## 2016-11-29 DIAGNOSIS — Z9181 History of falling: Secondary | ICD-10-CM

## 2016-11-29 DIAGNOSIS — Z0000 Encounter for general adult medical examination without abnormal findings: Secondary | ICD-10-CM | POA: Diagnosis not present

## 2016-11-29 DIAGNOSIS — M810 Age-related osteoporosis without current pathological fracture: Secondary | ICD-10-CM

## 2016-11-29 MED ORDER — RISEDRONATE SODIUM 35 MG PO TBEC: 35.0000 mg | DELAYED_RELEASE_TABLET | ORAL | 2 refills | Status: DC

## 2016-11-29 NOTE — Progress Notes (Signed)
Patient ID: Janice Reed, female   DOB: [birth year], 83 y.o.   MRN: [id number]     Subjective:   Janice Reed is a 83 y.o. white, female who presents for a Subsequent Medicare Annual Wellness Visit.  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  She is widowed fpr 22 years.  She lives alone but her daughter lives next door.  Janice Reed has 1 son and 1 daughter.  She is pleasant, alert and oriented today.   Review of Systems  Review of Systems  Constitutional: Negative.   HENT: Positive for hearing loss (has hearing aid on right).   Eyes: Negative.   Respiratory: Negative.   Cardiovascular: Positive for leg swelling (left greater than right).  Gastrointestinal:       However patient does report sometimes she strains and this worsens her hemorrhoids.   Genitourinary: Negative.   Musculoskeletal: Positive for back pain, falls (fell at home 03/2016 - rehabed for 6 weeks) and joint pain. Negative for myalgias and neck pain.       Neck "pops" from time to time   Skin: Negative.   Neurological: Tingling:     Endo/Heme/Allergies: Negative.   Psychiatric/Behavioral: Negative.    Current Medications (verified) Outpatient Encounter Prescriptions as of 11/29/2016  Medication Sig  . Aflibercept (EYLEA IO) Inject into the eye every 30 (thirty) days.   [name] Kitchen aspirin 325 MG tablet Take 325 mg by mouth See admin instructions. Take one tablet a day and more if needed for pain - up to 3 times daily  . bevacizumab (AVASTIN) 1.25 mg/0.1 mL SOLN 1.25 mg by Intravitreal route every 30 (thirty) days.   . Calcium-Magnesium-Vitamin D (CITRACAL CALCIUM+D PO) Take 1 capsule by mouth 2 (two) times daily.    . Cholecalciferol (VITAMIN D3) 1000 UNITS CAPS Take 1,000 Units by mouth 2 (two) times daily.   [name] Kitchen levothyroxine (SYNTHROID, LEVOTHROID) 100 MCG tablet Take 1 tablet (100 mcg total) by mouth daily.  [name] Kitchen levothyroxine (SYNTHROID, LEVOTHROID) 25 MCG tablet TAKE 1 TABLET (25  MCG TOTAL) BY MOUTH DAILY BEFORE BREAKFAST. AS DIRECTED (Patient taking differently: Take 12.5 mcg by mouth every Monday, Wednesday, and Friday. TAKE 1 TABLET (25 MCG TOTAL) BY MOUTH DAILY BEFORE BREAKFAST. AS DIRECTED)  . Multiple Vitamins-Minerals (ICAPS AREDS FORMULA PO) Take 1 capsule by mouth 2 (two) times daily.   . Omega-3 Fatty Acids (FISH OIL) 1000 MG CAPS Take 1,000 mg by mouth 2 (two) times daily.   . risedronate (ACTONEL) 35 MG tablet TAKE 1 TABLET BY MOUTH  EVERY 7 DAYS WITH WATER ON  EMPTY STOMACH, NOTHING BY  MOUTH OR LIE DOWN FOR NEXT  30 MINUTES.  [name] Kitchen triamterene-hydrochlorothiazide (MAXZIDE-25) 37.5-25 MG tablet Take one-half tablet by  mouth daily  . donepezil (ARICEPT) 5 MG tablet Take 1 tablet (5 mg total) by mouth at bedtime. (Patient not taking: Reported on 11/29/2016)  . [DISCONTINUED] triamcinolone cream (KENALOG) 0.1 % Reported on 12/08/2015   No facility-administered encounter medications on file as of 11/29/2016.     Allergies (verified) Ciprofloxacin; Benicar [olmesartan medoxomil]; Clarithromycin; Sulfa antibiotics; Alendronate sodium; Celebrex [celecoxib]; and Penicillins   History: Past Medical History:  Diagnosis Date  . Arthritis   . Arthritis pain   . Benign hypertensive heart disease   . Breast cancer ([place])   . Cancer of right breast ([place]) 09/28/2011   IDC;  T2, N0 (I[facility]+);   ER+;  Her-2 neg.,  Right PM,SLN  09/08/08   .  Collagenous colitis   . Diverticulosis of colon   . External hemorrhoid   . Hearing loss   . Hypertension   . IBS (irritable bowel syndrome)   . Macular degeneration of both eyes   . Neuropathy, peripheral   . Nonspecific abnormal electrocardiogram (ECG) (EKG)   . Osteoporosis   . Other and unspecified hyperlipidemia   . Palpitations   . Phlebitis and thrombophlebitis of unspecified site   . Prolapse of vaginal walls without mention of uterine prolapse   . Shingles   . Symptomatic menopausal or female climacteric states   .  Unspecified hypothyroidism    Past Surgical History:  Procedure Laterality Date  . BACK SURGERY    . BREAST LUMPECTOMY     per medical history form dated 09/27/09.  [name] Kitchen CARPAL TUNNEL RELEASE     bilateral  . COLONOSCOPY  12/03/2002   Dr. Silvano Rusk  . NEUROPLASTY / TRANSPOSITION MEDIAN NERVE AT CARPAL TUNNEL BILATERAL    . TONSILLECTOMY AND ADENOIDECTOMY    . VAGINAL HYSTERECTOMY     Family History  Problem Relation Age of Onset  . Heart disease Mother     Heart failure per medical history form dated 09/27/09.  [name] Kitchen Heart disease Father     Heart attack per medical history form dated 09/27/09.  [name] Kitchen Heart attack Father   . Heart disease Brother     Heart failure per medical history form dated 09/27/09.  . Stroke Brother   . Stroke Brother   . Leukemia Brother   . Breast cancer Cousin   . Colon cancer Neg Hx    Social History   Occupational History  . retired-homemaker    Social History Main Topics  . Smoking status: Never Smoker  . Smokeless tobacco: Never Used  . Alcohol use No  . Drug use: No  . Sexual activity: No    Do you feel safe at home?  Yes  Dietary issues and exercise activities: Current Exercise Habits: Home exercise routine, Type of exercise: stretching (does exercises taught in rehab), Time (Minutes): 10, Frequency (Times/Week): 5, Weekly Exercise (Minutes/Week): 50, Intensity: Mild  Current Dietary habits:  Drinks Ensure once a day in the mornings.  Eats lunch and dinner regularly and snacks from time to time.    Objective:    Today's Vitals   11/29/16 1454  BP: 124/78  Pulse: 78  Weight: 120 lb (54.4 kg)  Height: _0  (1.549 m)  PainSc: 0-No pain   Body mass index is 22.67 kg/m.    DEXA results 04/05/218 Total Left femur = T-Score was -3.6 Radius of left forearm = T-Score -2.4  11/04/2014 Total Left Femur = T-Score was -2.9 10/30/2012 Total Left Femur = T-Score was -3.1 09/07/2010 Total Left Femur = T-Score was -2.8   Activities of  Daily Living In your present state of health, do you have any difficulty performing the following activities: 11/29/2016  Hearing? Y  Vision? N  Difficulty concentrating or making decisions? Y  Walking or climbing stairs? Y  Dressing or bathing? N  Doing errands, shopping? Y  Preparing Food and eating ? N  Using the Toilet? N  In the past six months, have you accidently leaked urine? N  Do you have problems with loss of bowel control? N  Managing your Medications? N  Managing your Finances? N  Housekeeping or managing your Housekeeping? N  Some recent data might be hidden    Are there smokers in your home (other than  you)? No   Cardiac Risk Factors include: advanced age (>72mn, >>9women);dyslipidemia;hypertension;sedentary lifestyle  Depression Screen PHQ 2/9 Scores 11/29/2016 11/09/2016 08/03/2016 06/14/2016  PHQ - 2 Score 0 0 1 0  PHQ- 9 Score - - - -    Fall Risk Fall Risk  11/29/2016 11/09/2016 08/03/2016 06/14/2016 04/24/2016  Falls in the past year? _0   Number falls in past yr: _1 Injury with Fall? _2   Risk Factor Category  High Fall Risk - - - -  Risk for fall due to : History of fall(s);Impaired balance/gait;Impaired mobility - - - -  Follow up - - - - -    Cognitive Function: MMSE - Mini Mental State Exam 11/29/2016 10/08/2015 11/04/2014  Orientation to time _3 Orientation to Place _4 Registration _5 Attention/ Calculation _6 Recall _7 Language- name 2 objects _8 Language- repeat _9 Language- follow 3 step command _10 Language- read & follow direction _11 Write a sentence _12 Copy design _13 Total score 28 30 -    Immunizations and Health Maintenance Immunization History  Administered Date(s) Administered  . Influenza Whole 04/07/2010  . Influenza, High Dose Seasonal PF 06/14/2016  . Influenza,inj,Quad PF,36+ Mos 06/04/2013, 05/22/2014, 05/31/2015  . Pneumococcal Conjugate-13  06/04/2013  . Pneumococcal Polysaccharide-23 08/07/1998  . Td 02/05/2007   There are no preventive care reminders to display for this patient.  Patient Care Team: DChipper Herb MD as PCP - General (Family Medicine) KRalene Muskratas Physician Assistant (Chiropractic Medicine)  BBillie Ruddy- ophthalmologist ([facility])  Indicate any recent Medical Services you may have received from other than [facility] providers in the past year (date may be approximate).    Assessment:    Annual Wellness Visit  High fall risk / leg weakness osteoporosis   Screening Tests Health Maintenance  Topic Date Due  . TETANUS/TDAP  02/04/2017  . INFLUENZA VACCINE  03/07/2017  . DEXA SCAN  11/10/2018  . PNA vac Low Risk Adult  Completed        Plan:   During the course of the visit EMazywas educated and counseled about the following appropriate screening and preventive services:   Vaccines to include Pneumoccal, Influenza, Hepatitis B, Td, Zostavax - recommended vaccines are UTD.  Gave information about Shingrix Vaccine  Colorectal cancer screening - UTD  Cardiovascular disease screening - last EKG was 2009  Diabetes screening - UTD - last FBG was WNL  Bone Denisty / Osteoporosis Screening - UTD, discussed results with patient today.  Switch from risendronate (actonel) to ACherokeewhich is EC risendronate and does not need to be taken on empty stomach.   Continue calcium intake and vitamin D.   Mammogram - due now  [facility] [place]  PAP - no longer required  Glaucoma screening /  Eye Exam - UTD - sees Dr BRicki Millerregularly for macular degeneration   Nutrition counseling - Continue to drink Ensure 1 time daily.  Eat a variety of fruits and vegetables, lean proteins and whole grains  Physical Activity - referred for PT for leg strength building and fall risk assessment  Decreased hearing - offered appt  with audiologist.  Patient declined because  she is unable to get hearing aid until 06/2016.  Will make appt closer to that time  Advanced Directives - UTD / copy requested.   Patient Instructions (the written plan) were given to the patient.   Cherre Robins, PharmD   11/29/2016

## 2016-11-29 NOTE — Patient Instructions (Addendum)
  Janice Reed , Thank you for taking time to come for your Medicare Wellness Visit. I appreciate your ongoing commitment to your health goals. Please review the following plan we discussed and let me know if I can assist you in the future.   These are the goals we discussed:  I am sending a referral for physical therapy for help with leg weakness and balance.   Remember to contact audiologist for hearing assessment / new hearing aid.  Continue to drink Ensure daily and eat a variety of fruits and vegetables, lean proteins and whole grains.   I have sent in a new prescription for risedronate ER (generic Atlevia) to take in place of plain risedronate (Actonel) - you can take this one with food if you want.     This is a list of the screening recommended for you and due dates:  Health Maintenance  Topic Date Due  . Tetanus Vaccine  02/04/2017  . Flu Shot  03/07/2017  . DEXA scan (bone density measurement)  11/10/2018  . Pneumonia vaccines  Completed

## 2016-12-04 ENCOUNTER — Telehealth: Payer: Self-pay | Admitting: Family Medicine

## 2016-12-04 NOTE — Telephone Encounter (Signed)
Pt called  I caps vitamin changed and she wanted [place] aware

## 2016-12-05 ENCOUNTER — Ambulatory Visit: Payer: Medicare Other | Attending: Family Medicine | Admitting: Physical Therapy

## 2016-12-05 DIAGNOSIS — R2689 Other abnormalities of gait and mobility: Secondary | ICD-10-CM | POA: Insufficient documentation

## 2016-12-05 DIAGNOSIS — M6281 Muscle weakness (generalized): Secondary | ICD-10-CM

## 2016-12-05 DIAGNOSIS — R2681 Unsteadiness on feet: Secondary | ICD-10-CM | POA: Diagnosis not present

## 2016-12-05 NOTE — Therapy (Signed)
[facility] [place], [place], [address] Phone: [phone]   Fax:  [phone]  Physical Therapy Evaluation  Patient Details  Name: MILLER EDGINGTON MRN: [id number] Date of Birth: [birth year] Referring Provider: Redge Gainer, MD  Encounter Date: 12/05/2016      PT End of Session - 12/05/16 1224    Visit Number 1   Number of Visits 16   Date for PT Re-Evaluation 01/30/17   Authorization Type UHC Medicare   PT Start Time 1045  pt arrived late   PT Stop Time 1108   PT Time Calculation (min) 23 min   Activity Tolerance Patient tolerated treatment well   Behavior During Therapy [facility] for tasks assessed/performed      Past Medical History:  Diagnosis Date  . Arthritis   . Arthritis pain   . Benign hypertensive heart disease   . Breast cancer ([place])   . Cancer of right breast ([place]) 09/28/2011   IDC;  T2, N0 (IHC+);   ER+;  Her-2 neg.,  Right PM,SLN  09/08/08   . Collagenous colitis   . Diverticulosis of colon   . External hemorrhoid   . Hearing loss   . Hypertension   . IBS (irritable bowel syndrome)   . Macular degeneration of both eyes   . Neuropathy, peripheral   . Nonspecific abnormal electrocardiogram (ECG) (EKG)   . Osteoporosis   . Other and unspecified hyperlipidemia   . Palpitations   . Phlebitis and thrombophlebitis of unspecified site   . Prolapse of vaginal walls without mention of uterine prolapse   . Shingles   . Symptomatic menopausal or female climacteric states   . Unspecified hypothyroidism     Past Surgical History:  Procedure Laterality Date  . BACK SURGERY    . BREAST LUMPECTOMY     per medical history form dated 09/27/09.  [name] Kitchen CARPAL TUNNEL RELEASE     bilateral  . COLONOSCOPY  12/03/2002   Dr. Silvano Rusk  . NEUROPLASTY / TRANSPOSITION MEDIAN NERVE AT CARPAL TUNNEL BILATERAL    . TONSILLECTOMY AND ADENOIDECTOMY    . VAGINAL HYSTERECTOMY      There were no vitals filed for this  visit.       Subjective Assessment - 12/05/16 1046    Subjective Pt is a 83 y/o female who presents to OPPT for balance deficits following fall in July 2017.  Pt unsure why she fell, but now feels she has pain all over and continued difficulty with balance.  Pt now using RW since fall.     Pertinent History arthritis, osteoporosis, hx breast cancer, HLD, HTN, macular degeneration, neuropathy   Limitations Walking   Patient Stated Goals improve joint pain (MD feels this is related to arthritis)   Currently in Pain? Yes   Pain Score 0-No pain  up to 7-8/10 with activity   Pain Location Generalized  "every joint"   Pain Descriptors / Indicators --  "just pain in the joints"   Pain Type Chronic pain   Pain Onset More than a month ago   Pain Frequency Intermittent   Aggravating Factors  walking, mobility   Pain Relieving Factors sitting   Effect of Pain on Daily Activities will monitor and attempt to address through modalities, stretching and exercises but unsure if significant improvement will be made due to nature of pain            [facility] PT Assessment - 12/05/16 1050      Assessment  Medical Diagnosis weakness, hx of falls   Referring Provider Redge Gainer, MD   Onset Date/Surgical Date --  July 2017   Next MD Visit 03/19/17   Prior Therapy several times - unrelated to fall     Precautions   Precautions Fall     Restrictions   Weight Bearing Restrictions No     Balance Screen   Has the patient fallen in the past 6 months No   How many times? 1 in July   Has the patient had a decrease in activity level because of a fear of falling?  Yes   Is the patient reluctant to leave their home because of a fear of falling?  No     Home Environment   Living Environment Private residence   Living Arrangements Alone   Type of [place] to enter   Entrance Stairs-Number of Steps 1   [place] One level  has basement   [place] - 2 wheels      Prior Function   Level of Independence Requires assistive device for independence   Vocation Retired   Leisure reading, watching TV     Cognition   Overall Cognitive Status Within Functional Limits for tasks assessed     Posture/Postural Control   Posture/Postural Control Postural limitations   Postural Limitations Rounded Shoulders;Forward head;Increased thoracic kyphosis   Posture Comments significant Rt knee valgus     Strength   Overall Strength Comments all tested in sitting   Strength Assessment Site Hip;Knee;Ankle   Right/Left Hip Left;Right   Right Hip Flexion 4-/5   Right Hip ABduction 4-/5   Right Hip ADduction 5/5   Left Hip Flexion 4/5   Left Hip ABduction 4-/5   Left Hip ADduction 5/5   Right/Left Knee Right;Left   Right Knee Flexion 3/5   Right Knee Extension 4/5   Left Knee Flexion 3/5   Left Knee Extension 4-/5   Right/Left Ankle Right;Left   Right Ankle Dorsiflexion 4/5   Left Ankle Dorsiflexion 3+/5     Transfers   Five time sit to stand comments  24.25 sec needing 1 UE support     Ambulation/Gait   Gait Pattern Step-to pattern;Lateral hip instability;Trendelenburg;Trunk flexed  Rt genu valgus   Gait velocity 1.38 ft/sec  _0  = 10.53 sec                             PT Short Term Goals - 12/05/16 1504      PT SHORT TERM GOAL #1   Title verbalize understanding of fall prevention strategies (01/02/17)   Time 4   Period Weeks   Status New     PT SHORT TERM GOAL #2   Title improve gait velocity to > 1.8 ft/sec for improved function and decreased fall risk (01/02/17)   Time 4   Period Weeks   Status New     PT SHORT TERM GOAL #3   Title perform at least 3 reps of sit to/from stand without UE support from chair height for improved functional strength (01/02/17)   Time 4   Period Weeks   Status New     PT SHORT TERM GOAL #4   Title perform BERG and write appropriate LTG (01/02/17)   Time 4   Period Weeks   Status  New           PT Long Term Goals -  12/05/16 1505      PT LONG TERM GOAL #1   Title independent with HEP (12/30/16)   Time 8   Period Weeks   Status New     PT LONG TERM GOAL #2   Title improve gait velocity to > 2.1 ft/sec for improved function and community access (12/30/16)   Time 8   Period Weeks   Status New     PT LONG TERM GOAL #3   Title perform 5x STS without UE support in < 20 sec for improved functional strength (12/30/16)   Time 8   Period Weeks   Status New     PT LONG TERM GOAL #4   Title amb > 250' with RW modified independent on paved outdoor/indoor surfaces for improved community access (12/30/16)   Time 8   Period Weeks   Status New     PT LONG TERM GOAL #5   Title BERG LTG to follow (12/30/16)               Plan - 12/05/16 1501    Clinical Impression Statement Pt is a 83 y/o female who presents to OPPT for moderate complexity PT eval for lower extremity weakness and balance deficits.  Gait velocity and 5x STS indicate pt is at risk for falls, and strength deficits noted in bil lower extremities.  Pt will benefit from PT to address deficits.    Rehab Potential Fair   Clinical Impairments Affecting Rehab Potential severity of deficits   PT Frequency 2x / week   PT Duration 8 weeks   PT Treatment/Interventions ADLs/Self Care Home Management;Cryotherapy;Moist Heat;Neuromuscular re-education;Balance training;Therapeutic exercise;Therapeutic activities;Functional mobility training;Stair training;Gait training;Patient/family education;Manual techniques;Vestibular;Taping;Electrical Stimulation;Ultrasound   PT Next Visit Plan initiate HEP for balance and lower extremity strengthening, general conditioning   Consulted and Agree with Plan of Care Patient      Patient will benefit from skilled therapeutic intervention in order to improve the following deficits and impairments:  Abnormal gait, Decreased balance, Decreased strength, Pain, Difficulty walking,  Decreased mobility, Postural dysfunction  Visit Diagnosis: Unsteadiness on feet - Plan: PT plan of care cert/re-cert  Other abnormalities of gait and mobility - Plan: PT plan of care cert/re-cert  Muscle weakness (generalized) - Plan: PT plan of care cert/re-cert      G-Codes - 16/96/78 1508    Functional Assessment Tool Used (Outpatient Only) clinical judgement, GV, 5x STS   Functional Limitation Mobility: Walking and moving around   Mobility: Walking and Moving Around Current Status ([id number]) At least 60 percent but less than 80 percent impaired, limited or restricted   Mobility: Walking and Moving Around Goal Status [phone]) At least 40 percent but less than 60 percent impaired, limited or restricted       Problem List Patient Active Problem List   Diagnosis Date Noted  . Benign essential HTN 05/22/2014  . Age-related macular degeneration, wet, both eyes ([place]) 03/12/2014  . Internal hemorrhoids without mention of complication 25/85/2778  . Immune fecal occult blood test + x 2 01/05/2012  . Cancer of right breast ([place]) 09/28/2011  . Hearing loss 12/27/2010  . Arthritis 12/27/2010  . Hyperlipidemia   . Phlebitis and thrombophlebitis of unspecified site   . Symptomatic menopausal or female climacteric states   . Malaise and fatigue   . Palpitations   . Nonspecific abnormal electrocardiogram (ECG) (EKG)   . Hypothyroidism   . Osteoporosis   . IBS (irritable bowel syndrome)       Augusto Garbe  Zigmund Daniel, PT, DPT 12/05/16 3:10 PM   [facility] [address] [place], [place], [address] Phone: [phone]   Fax:  [phone]  Name: ONI DIETZMAN MRN: [id number] Date of Birth: Dec 05, 1921

## 2016-12-11 ENCOUNTER — Encounter: Payer: Self-pay | Admitting: Physical Therapy

## 2016-12-11 ENCOUNTER — Ambulatory Visit: Payer: Medicare Other | Admitting: Physical Therapy

## 2016-12-11 DIAGNOSIS — M6281 Muscle weakness (generalized): Secondary | ICD-10-CM | POA: Diagnosis not present

## 2016-12-11 DIAGNOSIS — R2681 Unsteadiness on feet: Secondary | ICD-10-CM

## 2016-12-11 DIAGNOSIS — R2689 Other abnormalities of gait and mobility: Secondary | ICD-10-CM

## 2016-12-11 NOTE — Therapy (Signed)
[facility] [place], [place], [address] Phone: [phone]   Fax:  [phone]  Physical Therapy Treatment  Patient Details  Name: Janice Reed MRN: [id number] Date of Birth: 09/23/1921 Referring Provider: Redge Gainer, MD  Encounter Date: 12/11/2016      PT End of Session - 12/11/16 1039    Visit Number 2   Number of Visits 16   Date for PT Re-Evaluation 01/30/17   Authorization Type UHC Medicare   PT Start Time 1037   PT Stop Time 1123   PT Time Calculation (min) 46 min   Activity Tolerance Patient tolerated treatment well   Behavior During Therapy [facility] for tasks assessed/performed      Past Medical History:  Diagnosis Date  . Arthritis   . Arthritis pain   . Benign hypertensive heart disease   . Breast cancer ([place])   . Cancer of right breast ([place]) 09/28/2011   IDC;  T2, N0 (IHC+);   ER+;  Her-2 neg.,  Right PM,SLN  09/08/08   . Collagenous colitis   . Diverticulosis of colon   . External hemorrhoid   . Hearing loss   . Hypertension   . IBS (irritable bowel syndrome)   . Macular degeneration of both eyes   . Neuropathy, peripheral   . Nonspecific abnormal electrocardiogram (ECG) (EKG)   . Osteoporosis   . Other and unspecified hyperlipidemia   . Palpitations   . Phlebitis and thrombophlebitis of unspecified site   . Prolapse of vaginal walls without mention of uterine prolapse   . Shingles   . Symptomatic menopausal or female climacteric states   . Unspecified hypothyroidism     Past Surgical History:  Procedure Laterality Date  . BACK SURGERY    . BREAST LUMPECTOMY     per medical history form dated 09/27/09.  [name] Kitchen CARPAL TUNNEL RELEASE     bilateral  . COLONOSCOPY  12/03/2002   Dr. Silvano Rusk  . NEUROPLASTY / TRANSPOSITION MEDIAN NERVE AT CARPAL TUNNEL BILATERAL    . TONSILLECTOMY AND ADENOIDECTOMY    . VAGINAL HYSTERECTOMY      There were no vitals filed for this visit.      Subjective  Assessment - 12/11/16 1038    Subjective Reports that her main goal is to strengthen her legs.   Pertinent History arthritis, osteoporosis, hx breast cancer, HLD, HTN, macular degeneration, neuropathy   Limitations Walking   Patient Stated Goals improve joint pain (MD feels this is related to arthritis)   Currently in Pain? Other (Comment)  Reports a "little pain" upon walking but no pain assessment provided by patient            [facility] PT Assessment - 12/11/16 0001      Assessment   Medical Diagnosis weakness, hx of falls   Next MD Visit 03/19/17   Prior Therapy several times - unrelated to fall     Precautions   Precautions Fall     Restrictions   Weight Bearing Restrictions No                     [facility] Adult PT Treatment/Exercise - 12/11/16 0001      Exercises   Exercises Knee/Hip     Knee/Hip Exercises: Aerobic   Nustep L4 x17 min     Knee/Hip Exercises: Standing   Hip Abduction AROM;Both;1 set;15 reps;Knee straight     Knee/Hip Exercises: Seated   Long Arc Quad Strengthening;Both;2 sets;10 reps  Long Arc Quad Limitations Red theraband   Clamshell with TheraBand Red  x20 reps   Hamstring Curl Strengthening;Both;2 sets;10 reps   Hamstring Limitations Red theraband   Sit to Sand 10 reps;with UE support;without UE support                PT Education - 12/11/16 1223    Education provided Yes   Education Details HEP- knee ext and flex with red theraband, seated clam, standing hip abduction, ankle pumps; R knee brace application   Person(s) Educated Patient   Methods Explanation;Demonstration   Comprehension Verbalized understanding;Returned demonstration          PT Short Term Goals - 12/05/16 1504      PT SHORT TERM GOAL #1   Title verbalize understanding of fall prevention strategies (01/02/17)   Time 4   Period Weeks   Status New     PT SHORT TERM GOAL #2   Title improve gait velocity to > 1.8 ft/sec for improved function and  decreased fall risk (01/02/17)   Time 4   Period Weeks   Status New     PT SHORT TERM GOAL #3   Title perform at least 3 reps of sit to/from stand without UE support from chair height for improved functional strength (01/02/17)   Time 4   Period Weeks   Status New     PT SHORT TERM GOAL #4   Title perform BERG and write appropriate LTG (01/02/17)   Time 4   Period Weeks   Status New           PT Long Term Goals - 12/05/16 1505      PT LONG TERM GOAL #1   Title independent with HEP (12/30/16)   Time 8   Period Weeks   Status New     PT LONG TERM GOAL #2   Title improve gait velocity to > 2.1 ft/sec for improved function and community access (12/30/16)   Time 8   Period Weeks   Status New     PT LONG TERM GOAL #3   Title perform 5x STS without UE support in < 20 sec for improved functional strength (12/30/16)   Time 8   Period Weeks   Status New     PT LONG TERM GOAL #4   Title amb > 250' with RW modified independent on paved outdoor/indoor surfaces for improved community access (12/30/16)   Time 8   Period Weeks   Status New     PT LONG TERM GOAL #5   Title BERG LTG to follow (12/30/16)               Plan - 12/11/16 1218    Clinical Impression Statement Patient presented in clinic with only reports of "some pain" with ambulation and especially in R knee. Patient wishes to be able to ambulate with cane or no AD as she was prior to fall. Patient educated regarding HEP exercises in clinic with no complaints. Patient able to complete sit to stands without UE support but required multiple attempts at standing. Patient educated that she could use UE support at this time but to not allow UEs to do all the work as well as to sit towards edge of chair. Patient also educated regarding knee brace application with instruction throughout application and patient verbalizing understanding.   Rehab Potential Fair   Clinical Impairments Affecting Rehab Potential severity of  deficits   PT Frequency 2x / week  PT Duration 8 weeks   PT Treatment/Interventions ADLs/Self Care Home Management;Cryotherapy;Moist Heat;Neuromuscular re-education;Balance training;Therapeutic exercise;Therapeutic activities;Functional mobility training;Stair training;Gait training;Patient/family education;Manual techniques;Vestibular;Taping;Electrical Stimulation;Ultrasound   PT Next Visit Plan Continue with LE strengthening and general conditioning with patient hopes of ambulating with cane per MPT POC.   PT Home Exercise Plan HEP- knee flex and ext with red theraband, seated clam, standing hip abduction, ankle pumps   Consulted and Agree with Plan of Care Patient      Patient will benefit from skilled therapeutic intervention in order to improve the following deficits and impairments:  Abnormal gait, Decreased balance, Decreased strength, Pain, Difficulty walking, Decreased mobility, Postural dysfunction  Visit Diagnosis: Unsteadiness on feet  Other abnormalities of gait and mobility  Muscle weakness (generalized)     Problem List Patient Active Problem List   Diagnosis Date Noted  . Benign essential HTN 05/22/2014  . Age-related macular degeneration, wet, both eyes ([place]) 03/12/2014  . Internal hemorrhoids without mention of complication 99/41/2904  . Immune fecal occult blood test + x 2 01/05/2012  . Cancer of right breast ([place]) 09/28/2011  . Hearing loss 12/27/2010  . Arthritis 12/27/2010  . Hyperlipidemia   . Phlebitis and thrombophlebitis of unspecified site   . Symptomatic menopausal or female climacteric states   . Malaise and fatigue   . Palpitations   . Nonspecific abnormal electrocardiogram (ECG) (EKG)   . Hypothyroidism   . Osteoporosis   . IBS (irritable bowel syndrome)     Wynelle Fanny, PTA 12/11/2016, 12:24 PM  [facility] [address] [place], [place], [address] Phone: [phone]   Fax:   [phone]  Name: Janice Reed MRN: [id number] Date of Birth: 05/05/1922

## 2016-12-11 NOTE — Patient Instructions (Addendum)
Knee Extension: Resisted (Sitting)    With band looped around right/left ankle and under other foot, straighten leg with ankle loop. Keep other leg bent to increase resistance. Repeat _10___ times per set. Do __2__ sets per session. Do __2-3__ sessions per day.  http://orth.exer.us/690   Copyright  VHI. All rights reserved.  Knee Flexion: Resisted (Sitting)    Sit with band under left/right foot and looped around ankle of supported leg. Pull unsupported leg back. Repeat ___10_ times per set. Do __2__ sets per session. Do _2-3___ sessions per day.  http://orth.exer.us/694   Copyright  VHI. All rights reserved.  Strengthening: Hip Abduction - Resisted    Standing at countertop, hold with both hands and then kick leg straight out to the side. Repeat __10__ times per set. Do __2__ sets per session. Do __2-3__ sessions per day.  http://orth.exer.us/634   Copyright  VHI. All rights reserved.  Strengthening: Hip Abductor - Resisted    With band looped around both legs above knees, push thighs apart. Repeat _10___ times per set. Do _2___ sets per session. Do _2-3___ sessions per day.  http://orth.exer.us/688   Copyright  VHI. All rights reserved.  ROM: Plantar / Dorsiflexion    With  legs relaxed, gently flex and extend ankle. Move through full range of motion. Avoid pain. Repeat _10___ times per set. Do _3___ sets per session. Do _3-4___ sessions per day.  http://orth.exer.us/34   Copyright  VHI. All rights reserved.

## 2016-12-12 DIAGNOSIS — H353211 Exudative age-related macular degeneration, right eye, with active choroidal neovascularization: Secondary | ICD-10-CM | POA: Diagnosis not present

## 2016-12-12 DIAGNOSIS — H353221 Exudative age-related macular degeneration, left eye, with active choroidal neovascularization: Secondary | ICD-10-CM | POA: Diagnosis not present

## 2016-12-14 ENCOUNTER — Ambulatory Visit: Payer: Medicare Other | Admitting: Physical Therapy

## 2016-12-14 ENCOUNTER — Encounter: Payer: Self-pay | Admitting: Physical Therapy

## 2016-12-14 DIAGNOSIS — R2681 Unsteadiness on feet: Secondary | ICD-10-CM | POA: Diagnosis not present

## 2016-12-14 DIAGNOSIS — R2689 Other abnormalities of gait and mobility: Secondary | ICD-10-CM | POA: Diagnosis not present

## 2016-12-14 DIAGNOSIS — M6281 Muscle weakness (generalized): Secondary | ICD-10-CM | POA: Diagnosis not present

## 2016-12-14 NOTE — Therapy (Signed)
[facility] [place], [place], [address] Phone: [phone]   Fax:  [phone]  Physical Therapy Treatment  Patient Details  Name: Janice Reed MRN: [id number] Date of Birth: 13-Dec-1921 Referring Provider: Redge Gainer, MD  Encounter Date: 12/14/2016      PT End of Session - 12/14/16 1037    Visit Number 3   Number of Visits 16   Date for PT Re-Evaluation 01/30/17   Authorization Type UHC Medicare   PT Start Time 1032   PT Stop Time 1115   PT Time Calculation (min) 43 min   Activity Tolerance Patient tolerated treatment well   Behavior During Therapy [facility] for tasks assessed/performed      Past Medical History:  Diagnosis Date  . Arthritis   . Arthritis pain   . Benign hypertensive heart disease   . Breast cancer ([place])   . Cancer of right breast ([place]) 09/28/2011   IDC;  T2, N0 (IHC+);   ER+;  Her-2 neg.,  Right PM,SLN  09/08/08   . Collagenous colitis   . Diverticulosis of colon   . External hemorrhoid   . Hearing loss   . Hypertension   . IBS (irritable bowel syndrome)   . Macular degeneration of both eyes   . Neuropathy, peripheral   . Nonspecific abnormal electrocardiogram (ECG) (EKG)   . Osteoporosis   . Other and unspecified hyperlipidemia   . Palpitations   . Phlebitis and thrombophlebitis of unspecified site   . Prolapse of vaginal walls without mention of uterine prolapse   . Shingles   . Symptomatic menopausal or female climacteric states   . Unspecified hypothyroidism     Past Surgical History:  Procedure Laterality Date  . BACK SURGERY    . BREAST LUMPECTOMY     per medical history form dated 09/27/09.  [name] Kitchen CARPAL TUNNEL RELEASE     bilateral  . COLONOSCOPY  12/03/2002   Dr. Silvano Rusk  . NEUROPLASTY / TRANSPOSITION MEDIAN NERVE AT CARPAL TUNNEL BILATERAL    . TONSILLECTOMY AND ADENOIDECTOMY    . VAGINAL HYSTERECTOMY      There were no vitals filed for this visit.      Subjective  Assessment - 12/14/16 1037    Subjective Reports that her legs are sore from exercise.   Pertinent History arthritis, osteoporosis, hx breast cancer, HLD, HTN, macular degeneration, neuropathy   Limitations Walking   Patient Stated Goals improve joint pain (MD feels this is related to arthritis)   Currently in Pain? Other (Comment)  Soreness reported by patient but no rating provided by patient            [facility] PT Assessment - 12/14/16 0001      Assessment   Medical Diagnosis weakness, hx of falls   Next MD Visit 03/19/17   Prior Therapy several times - unrelated to fall     Precautions   Precautions Fall     Restrictions   Weight Bearing Restrictions No                     [facility] Adult PT Treatment/Exercise - 12/14/16 0001      Ambulation/Gait   Ambulation/Gait Yes   Ambulation/Gait Assistance 6: Modified independent (Device/Increase time)   Ambulation Distance (Feet) 118 Feet   Assistive device Straight cane  Handle higher than wrist level to avoid excessive trunk flex   Gait Pattern Step-through pattern;Decreased arm swing - left;Decreased step length - left;Decreased weight  shift to left;Lateral trunk lean to right;Decreased trunk rotation;Trunk flexed;Narrow base of support   Ambulation Surface Level;Indoor     Knee/Hip Exercises: Aerobic   Nustep L4 x18 min     Knee/Hip Exercises: Standing   Heel Raises Both;2 sets;10 reps   Hip Flexion AROM;Both;2 sets;10 reps;Knee bent   Hip Abduction AROM;Both;2 sets;10 reps;Knee straight   Other Standing Knee Exercises Sidestepping along countertop x4 RT     Knee/Hip Exercises: Seated   Long Arc Quad Strengthening;Both;3 sets;10 reps;Weights   Long Arc Quad Limitations 3   Clamshell with [employer]ra[employer] Green  3x10 reps   Hamstring Curl Strengthening;Both;3 sets;10 reps   Hamstring Limitations Green theraband   Sit to [employer] 10 reps;without UE support                  PT Short Term Goals - 12/05/16 1504       PT SHORT TERM GOAL #1   Title verbalize understanding of fall prevention strategies (01/02/17)   Time 4   Period Weeks   Status New     PT SHORT TERM GOAL #2   Title improve gait velocity to > 1.8 ft/sec for improved function and decreased fall risk (01/02/17)   Time 4   Period Weeks   Status New     PT SHORT TERM GOAL #3   Title perform at least 3 reps of sit to/from stand without UE support from chair height for improved functional strength (01/02/17)   Time 4   Period Weeks   Status New     PT SHORT TERM GOAL #4   Title perform BERG and write appropriate LTG (01/02/17)   Time 4   Period Weeks   Status New           PT Long Term Goals - 12/05/16 1505      PT LONG TERM GOAL #1   Title independent with HEP (12/30/16)   Time 8   Period Weeks   Status New     PT LONG TERM GOAL #2   Title improve gait velocity to > 2.1 ft/sec for improved function and community access (12/30/16)   Time 8   Period Weeks   Status New     PT LONG TERM GOAL #3   Title perform 5x STS without UE support in < 20 sec for improved functional strength (12/30/16)   Time 8   Period Weeks   Status New     PT LONG TERM GOAL #4   Title amb > 250' with RW modified independent on paved outdoor/indoor surfaces for improved community access (12/30/16)   Time 8   Period Weeks   Status New     PT LONG TERM GOAL #5   Title BERG LTG to follow (12/30/16)               Plan - 12/14/16 1126    Clinical Impression Statement Patient did very well during treatment today as she was able to complete more exercises in standing and with reps and resistance. Trendelenberg deviation noted with standing hip abduction. R ankle edema noted today upon arrival with pitting noted with resisted HS curl that became uncomfortable to patient but normalized following a few minutes time. Patient had difficulty with sit to stands from plinth table without UE support. Patient appeared and reported confidence with [facility]  today in clinic and was excited to get away from walker.   Rehab Potential Fair   Clinical Impairments Affecting Rehab Potential severity  of deficits   PT Frequency 2x / week   PT Duration 8 weeks   PT Treatment/Interventions ADLs/Self Care Home Management;Cryotherapy;Moist Heat;Neuromuscular re-education;Balance training;Therapeutic exercise;Therapeutic activities;Functional mobility training;Stair training;Gait training;Patient/family education;Manual techniques;Vestibular;Taping;Electrical Stimulation;Ultrasound   PT Next Visit Plan Continue with LE strengthening and general conditioning with patient hopes of ambulating with cane per MPT POC.   PT Home Exercise Plan HEP- knee flex and ext with red theraband, seated clam, standing hip abduction, ankle pumps   Consulted and Agree with Plan of Care Patient      Patient will benefit from skilled therapeutic intervention in order to improve the following deficits and impairments:  Abnormal gait, Decreased balance, Decreased strength, Pain, Difficulty walking, Decreased mobility, Postural dysfunction  Visit Diagnosis: Unsteadiness on feet  Other abnormalities of gait and mobility  Muscle weakness (generalized)     Problem List Patient Active Problem List   Diagnosis Date Noted  . Benign essential HTN 05/22/2014  . Age-related macular degeneration, wet, both eyes ([place]) 03/12/2014  . Internal hemorrhoids without mention of complication 16/05/9603  . Immune fecal occult blood test + x 2 01/05/2012  . Cancer of right breast ([place]) 09/28/2011  . Hearing loss 12/27/2010  . Arthritis 12/27/2010  . Hyperlipidemia   . Phlebitis and thrombophlebitis of unspecified site   . Symptomatic menopausal or female climacteric states   . Malaise and fatigue   . Palpitations   . Nonspecific abnormal electrocardiogram (ECG) (EKG)   . Hypothyroidism   . Osteoporosis   . IBS (irritable bowel syndrome)     Wynelle Fanny, PTA 12/14/2016, 12:02  PM  [facility] [address] [place], [place], [address] Phone: [phone]   Fax:  [phone]  Name: Janice Reed MRN: [id number] Date of Birth: Mar 28, 1922

## 2016-12-18 ENCOUNTER — Ambulatory Visit: Payer: Medicare Other | Admitting: Physical Therapy

## 2016-12-18 ENCOUNTER — Encounter: Payer: Self-pay | Admitting: Physical Therapy

## 2016-12-18 DIAGNOSIS — R2681 Unsteadiness on feet: Secondary | ICD-10-CM

## 2016-12-18 DIAGNOSIS — R2689 Other abnormalities of gait and mobility: Secondary | ICD-10-CM

## 2016-12-18 DIAGNOSIS — M6281 Muscle weakness (generalized): Secondary | ICD-10-CM

## 2016-12-18 NOTE — Therapy (Signed)
[facility] [place], [place], [address] Phone: [phone]   Fax:  [phone]  Physical Therapy Treatment  Patient Details  Name: Janice Reed MRN: [id number] Date of Birth: [birth year] Referring Provider: Redge Gainer, MD  Encounter Date: 12/18/2016      PT End of Session - 12/18/16 1351    Visit Number 4   Number of Visits 16   Date for PT Re-Evaluation 01/30/17   Authorization Type UHC Medicare   PT Start Time 1347   PT Stop Time 1431   PT Time Calculation (min) 44 min   Activity Tolerance Patient tolerated treatment well      Past Medical History:  Diagnosis Date  . Arthritis   . Arthritis pain   . Benign hypertensive heart disease   . Breast cancer ([place])   . Cancer of right breast ([place]) 09/28/2011   IDC;  T2, N0 (IHC+);   ER+;  Her-2 neg.,  Right PM,SLN  09/08/08   . Collagenous colitis   . Diverticulosis of colon   . External hemorrhoid   . Hearing loss   . Hypertension   . IBS (irritable bowel syndrome)   . Macular degeneration of both eyes   . Neuropathy, peripheral   . Nonspecific abnormal electrocardiogram (ECG) (EKG)   . Osteoporosis   . Other and unspecified hyperlipidemia   . Palpitations   . Phlebitis and thrombophlebitis of unspecified site   . Prolapse of vaginal walls without mention of uterine prolapse   . Shingles   . Symptomatic menopausal or female climacteric states   . Unspecified hypothyroidism     Past Surgical History:  Procedure Laterality Date  . BACK SURGERY    . BREAST LUMPECTOMY     per medical history form dated 09/27/09.  [name] Kitchen CARPAL TUNNEL RELEASE     bilateral  . COLONOSCOPY  12/03/2002   Dr. Silvano Rusk  . NEUROPLASTY / TRANSPOSITION MEDIAN NERVE AT CARPAL TUNNEL BILATERAL    . TONSILLECTOMY AND ADENOIDECTOMY    . VAGINAL HYSTERECTOMY      There were no vitals filed for this visit.      Subjective Assessment - 12/18/16 1350    Subjective Reports that she  went out in her yard saturday with only a cane and was only out there about 15-20 minutes.   Pertinent History arthritis, osteoporosis, hx breast cancer, HLD, HTN, macular degeneration, neuropathy   Limitations Walking   Patient Stated Goals improve joint pain (MD feels this is related to arthritis)   Currently in Pain? No/denies            [facility] PT Assessment - 12/18/16 0001      Assessment   Medical Diagnosis weakness, hx of falls   Next MD Visit 03/19/17   Prior Therapy several times - unrelated to fall     Precautions   Precautions Fall     Restrictions   Weight Bearing Restrictions No                     [facility] Adult PT Treatment/Exercise - 12/18/16 0001      Knee/Hip Exercises: Aerobic   Nustep L4 x18 min     Knee/Hip Exercises: Standing   Heel Raises Both;2 sets;10 reps   Heel Raises Limitations B toe raise x20 reps    Hip Flexion AROM;Both;2 sets;10 reps;Knee bent   Hip Abduction AROM;Both;2 sets;10 reps;Knee straight   Other Standing Knee Exercises Sidestepping along countertop x4 RT  Knee/Hip Exercises: Seated   Long Arc Quad Strengthening;Both;3 sets;10 reps;Weights   Long Arc Quad Limitations 3   Hamstring Curl Strengthening;Both;3 sets;10 reps   Hamstring Limitations Green theraband                  PT Short Term Goals - 12/05/16 1504      PT SHORT TERM GOAL #1   Title verbalize understanding of fall prevention strategies (01/02/17)   Time 4   Period Weeks   Status New     PT SHORT TERM GOAL #2   Title improve gait velocity to > 1.8 ft/sec for improved function and decreased fall risk (01/02/17)   Time 4   Period Weeks   Status New     PT SHORT TERM GOAL #3   Title perform at least 3 reps of sit to/from stand without UE support from chair height for improved functional strength (01/02/17)   Time 4   Period Weeks   Status New     PT SHORT TERM GOAL #4   Title perform BERG and write appropriate LTG (01/02/17)   Time 4    Period Weeks   Status New           PT Long Term Goals - 12/05/16 1505      PT LONG TERM GOAL #1   Title independent with HEP (12/30/16)   Time 8   Period Weeks   Status New     PT LONG TERM GOAL #2   Title improve gait velocity to > 2.1 ft/sec for improved function and community access (12/30/16)   Time 8   Period Weeks   Status New     PT LONG TERM GOAL #3   Title perform 5x STS without UE support in < 20 sec for improved functional strength (12/30/16)   Time 8   Period Weeks   Status New     PT LONG TERM GOAL #4   Title amb > 250' with RW modified independent on paved outdoor/indoor surfaces for improved community access (12/30/16)   Time 8   Period Weeks   Status New     PT LONG TERM GOAL #5   Title BERG LTG to follow (12/30/16)               Plan - 12/18/16 1553    Clinical Impression Statement Patient continues to do well in treatment as new forward step ups were initiated with no complaints from patient. Patient did experience fatigue with standing hip abduction in BLEs per patient report. Patient ambulated into clinic with FWW per patient's daughter's insistance per patient report. Patient experienced R knee discomfort with seated HS curl due to her long history of R knee pain.   Rehab Potential Fair   Clinical Impairments Affecting Rehab Potential severity of deficits   PT Frequency 2x / week   PT Duration 8 weeks   PT Treatment/Interventions ADLs/Self Care Home Management;Cryotherapy;Moist Heat;Neuromuscular re-education;Balance training;Therapeutic exercise;Therapeutic activities;Functional mobility training;Stair training;Gait training;Patient/family education;Manual techniques;Vestibular;Taping;Electrical Stimulation;Ultrasound   PT Next Visit Plan Continue with LE strengthening and general conditioning with patient hopes of ambulating with cane per MPT POC.   PT Home Exercise Plan HEP- knee flex and ext with red theraband, seated clam, standing hip  abduction, ankle pumps   Consulted and Agree with Plan of Care Patient      Patient will benefit from skilled therapeutic intervention in order to improve the following deficits and impairments:  Abnormal gait, Decreased balance, Decreased strength,  Pain, Difficulty walking, Decreased mobility, Postural dysfunction  Visit Diagnosis: Unsteadiness on feet  Other abnormalities of gait and mobility  Muscle weakness (generalized)     Problem List Patient Active Problem List   Diagnosis Date Noted  . Benign essential HTN 05/22/2014  . Age-related macular degeneration, wet, both eyes ([place]) 03/12/2014  . Internal hemorrhoids without mention of complication 48/61/6122  . Immune fecal occult blood test + x 2 01/05/2012  . Cancer of right breast ([place]) 09/28/2011  . Hearing loss 12/27/2010  . Arthritis 12/27/2010  . Hyperlipidemia   . Phlebitis and thrombophlebitis of unspecified site   . Symptomatic menopausal or female climacteric states   . Malaise and fatigue   . Palpitations   . Nonspecific abnormal electrocardiogram (ECG) (EKG)   . Hypothyroidism   . Osteoporosis   . IBS (irritable bowel syndrome)     Wynelle Fanny, PTA 12/18/2016, 3:56 PM  [facility] [address] [place], [place], [address] Phone: [phone]   Fax:  [phone]  Name: Janice Reed MRN: [id number] Date of Birth: 09/04/1921

## 2016-12-21 ENCOUNTER — Ambulatory Visit: Payer: Medicare Other | Admitting: Physical Therapy

## 2016-12-21 DIAGNOSIS — M6281 Muscle weakness (generalized): Secondary | ICD-10-CM

## 2016-12-21 DIAGNOSIS — R2681 Unsteadiness on feet: Secondary | ICD-10-CM | POA: Diagnosis not present

## 2016-12-21 DIAGNOSIS — R2689 Other abnormalities of gait and mobility: Secondary | ICD-10-CM

## 2016-12-21 NOTE — Therapy (Signed)
[facility] [place], [place], [address] Phone: [phone]   Fax:  [phone]  Physical Therapy Treatment  Patient Details  Name: Janice Reed MRN: [id number] Date of Birth: 09-12-1921 Referring Provider: Redge Gainer, MD  Encounter Date: 12/21/2016      PT End of Session - 12/21/16 1335    Visit Number 5   Number of Visits 16   Date for PT Re-Evaluation 01/30/17   PT Start Time 1115   PT Stop Time 1200   PT Time Calculation (min) 45 min   Activity Tolerance Patient tolerated treatment well   Behavior During Therapy [facility] for tasks assessed/performed      Past Medical History:  Diagnosis Date  . Arthritis   . Arthritis pain   . Benign hypertensive heart disease   . Breast cancer ([place])   . Cancer of right breast ([place]) 09/28/2011   IDC;  T2, N0 (IHC+);   ER+;  Her-2 neg.,  Right PM,SLN  09/08/08   . Collagenous colitis   . Diverticulosis of colon   . External hemorrhoid   . Hearing loss   . Hypertension   . IBS (irritable bowel syndrome)   . Macular degeneration of both eyes   . Neuropathy, peripheral   . Nonspecific abnormal electrocardiogram (ECG) (EKG)   . Osteoporosis   . Other and unspecified hyperlipidemia   . Palpitations   . Phlebitis and thrombophlebitis of unspecified site   . Prolapse of vaginal walls without mention of uterine prolapse   . Shingles   . Symptomatic menopausal or female climacteric states   . Unspecified hypothyroidism     Past Surgical History:  Procedure Laterality Date  . BACK SURGERY    . BREAST LUMPECTOMY     per medical history form dated 09/27/09.  [name] Kitchen CARPAL TUNNEL RELEASE     bilateral  . COLONOSCOPY  12/03/2002   Dr. Silvano Rusk  . NEUROPLASTY / TRANSPOSITION MEDIAN NERVE AT CARPAL TUNNEL BILATERAL    . TONSILLECTOMY AND ADENOIDECTOMY    . VAGINAL HYSTERECTOMY      There were no vitals filed for this visit.      Subjective Assessment - 12/21/16 1337    Subjective Doing okay today.   Patient Stated Goals improve joint pain (MD feels this is related to arthritis)   Currently in Pain? No/denies                         [facility] Adult PT Treatment/Exercise - 12/21/16 0001      Neuro Re-ed    Neuro Re-ed Details  Green XTS resisted walking 4 way for a total of 8 minutes f/b Rockerboard in parallel bars and inverted BOSU ball (10 minutes total).     Exercises   Exercises Knee/Hip     Knee/Hip Exercises: Aerobic   Nustep Level 4 x 20 minutes.                  PT Short Term Goals - 12/05/16 1504      PT SHORT TERM GOAL #1   Title verbalize understanding of fall prevention strategies (01/02/17)   Time 4   Period Weeks   Status New     PT SHORT TERM GOAL #2   Title improve gait velocity to > 1.8 ft/sec for improved function and decreased fall risk (01/02/17)   Time 4   Period Weeks   Status New     PT SHORT  TERM GOAL #3   Title perform at least 3 reps of sit to/from stand without UE support from chair height for improved functional strength (01/02/17)   Time 4   Period Weeks   Status New     PT SHORT TERM GOAL #4   Title perform BERG and write appropriate LTG (01/02/17)   Time 4   Period Weeks   Status New           PT Long Term Goals - 12/05/16 1505      PT LONG TERM GOAL #1   Title independent with HEP (12/30/16)   Time 8   Period Weeks   Status New     PT LONG TERM GOAL #2   Title improve gait velocity to > 2.1 ft/sec for improved function and community access (12/30/16)   Time 8   Period Weeks   Status New     PT LONG TERM GOAL #3   Title perform 5x STS without UE support in < 20 sec for improved functional strength (12/30/16)   Time 8   Period Weeks   Status New     PT LONG TERM GOAL #4   Title amb > 250' with RW modified independent on paved outdoor/indoor surfaces for improved community access (12/30/16)   Time 8   Period Weeks   Status New     PT LONG TERM GOAL #5   Title BERG  LTG to follow (12/30/16)               Plan - 12/21/16 1336    Clinical Impression Statement Patient reported she felt like today's workout was challenging and enjoyed it.      Patient will benefit from skilled therapeutic intervention in order to improve the following deficits and impairments:  Abnormal gait, Decreased balance, Decreased strength, Pain, Difficulty walking, Decreased mobility, Postural dysfunction  Visit Diagnosis: Unsteadiness on feet  Other abnormalities of gait and mobility  Muscle weakness (generalized)     Problem List Patient Active Problem List   Diagnosis Date Noted  . Benign essential HTN 05/22/2014  . Age-related macular degeneration, wet, both eyes ([place]) 03/12/2014  . Internal hemorrhoids without mention of complication 48/62/8241  . Immune fecal occult blood test + x 2 01/05/2012  . Cancer of right breast ([place]) 09/28/2011  . Hearing loss 12/27/2010  . Arthritis 12/27/2010  . Hyperlipidemia   . Phlebitis and thrombophlebitis of unspecified site   . Symptomatic menopausal or female climacteric states   . Malaise and fatigue   . Palpitations   . Nonspecific abnormal electrocardiogram (ECG) (EKG)   . Hypothyroidism   . Osteoporosis   . IBS (irritable bowel syndrome)     Doyce Stonehouse, [place] MPT 12/21/2016, 2:04 PM  [facility] [place], [place], [address] Phone: [phone]   Fax:  [phone]  Name: Janice Reed MRN: [id number] Date of Birth: March 19, 1922

## 2016-12-25 ENCOUNTER — Encounter: Payer: Self-pay | Admitting: Physical Therapy

## 2016-12-25 ENCOUNTER — Ambulatory Visit: Payer: Medicare Other | Admitting: Physical Therapy

## 2016-12-25 DIAGNOSIS — M6281 Muscle weakness (generalized): Secondary | ICD-10-CM

## 2016-12-25 DIAGNOSIS — R2689 Other abnormalities of gait and mobility: Secondary | ICD-10-CM

## 2016-12-25 DIAGNOSIS — R2681 Unsteadiness on feet: Secondary | ICD-10-CM

## 2016-12-25 NOTE — Therapy (Signed)
[facility] [place], [place], [address] Phone: [phone]   Fax:  [phone]  Physical Therapy Treatment  Patient Details  Name: Janice Reed MRN: [id number] Date of Birth: 03-13-1922 Referring Provider: Redge Gainer, MD  Encounter Date: 12/25/2016      PT End of Session - 12/25/16 1402    Visit Number 6   Number of Visits 16   Date for PT Re-Evaluation 01/30/17   Authorization Type UHC Medicare   PT Start Time 2637   PT Stop Time 1359   PT Time Calculation (min) 42 min   Activity Tolerance Patient tolerated treatment well   Behavior During Therapy [facility] for tasks assessed/performed      Past Medical History:  Diagnosis Date  . Arthritis   . Arthritis pain   . Benign hypertensive heart disease   . Breast cancer ([place])   . Cancer of right breast ([place]) 09/28/2011   IDC;  T2, N0 (IHC+);   ER+;  Her-2 neg.,  Right PM,SLN  09/08/08   . Collagenous colitis   . Diverticulosis of colon   . External hemorrhoid   . Hearing loss   . Hypertension   . IBS (irritable bowel syndrome)   . Macular degeneration of both eyes   . Neuropathy, peripheral   . Nonspecific abnormal electrocardiogram (ECG) (EKG)   . Osteoporosis   . Other and unspecified hyperlipidemia   . Palpitations   . Phlebitis and thrombophlebitis of unspecified site   . Prolapse of vaginal walls without mention of uterine prolapse   . Shingles   . Symptomatic menopausal or female climacteric states   . Unspecified hypothyroidism     Past Surgical History:  Procedure Laterality Date  . BACK SURGERY    . BREAST LUMPECTOMY     per medical history form dated 09/27/09.  [name] Kitchen CARPAL TUNNEL RELEASE     bilateral  . COLONOSCOPY  12/03/2002   Dr. Silvano Rusk  . NEUROPLASTY / TRANSPOSITION MEDIAN NERVE AT CARPAL TUNNEL BILATERAL    . TONSILLECTOMY AND ADENOIDECTOMY    . VAGINAL HYSTERECTOMY      There were no vitals filed for this visit.      Subjective  Assessment - 12/25/16 1314    Subjective Patient reported being sore after last tretment, no falls reported   Pertinent History arthritis, osteoporosis, hx breast cancer, HLD, HTN, macular degeneration, neuropathy   Limitations Walking   Patient Stated Goals improve joint pain (MD feels this is related to arthritis)   Currently in Pain? No/denies                         [facility] Adult PT Treatment/Exercise - 12/25/16 0001      Exercises   Exercises Knee/Hip;Lumbar     Lumbar Exercises: Seated   Other Seated Lumbar Exercises half lumbar roll behind back for "V" and "W" 2x10     Knee/Hip Exercises: Stretches   Hip Flexor Stretch Both;3 reps;20 seconds  standing with cues for technique and upright posture     Knee/Hip Exercises: Aerobic   Nustep 80mn L3-4 UE/LE activity, monitored for progression     Knee/Hip Exercises: Seated   Long Arc Quad Strengthening;Both;3 sets;10 reps;Weights   Long Arc Quad Limitations 3   Clamshell with TheraBand Green  x20 each side   Other Seated Knee/Hip Exercises 30# x20, 40#x10 RLE, 40# x20 LLE marching eccentric control   Marching Strengthening;Both;10 reps;2 sets;Weights   Marching  Weights 3 lbs.   Sit to Sand 5 reps;with UE support                  PT Short Term Goals - 12/05/16 1504      PT SHORT TERM GOAL #1   Title verbalize understanding of fall prevention strategies (01/02/17)   Time 4   Period Weeks   Status New     PT SHORT TERM GOAL #2   Title improve gait velocity to > 1.8 ft/sec for improved function and decreased fall risk (01/02/17)   Time 4   Period Weeks   Status New     PT SHORT TERM GOAL #3   Title perform at least 3 reps of sit to/from stand without UE support from chair height for improved functional strength (01/02/17)   Time 4   Period Weeks   Status New     PT SHORT TERM GOAL #4   Title perform BERG and write appropriate LTG (01/02/17)   Time 4   Period Weeks   Status New            PT Long Term Goals - 12/05/16 1505      PT LONG TERM GOAL #1   Title independent with HEP (12/30/16)   Time 8   Period Weeks   Status New     PT LONG TERM GOAL #2   Title improve gait velocity to > 2.1 ft/sec for improved function and community access (12/30/16)   Time 8   Period Weeks   Status New     PT LONG TERM GOAL #3   Title perform 5x STS without UE support in < 20 sec for improved functional strength (12/30/16)   Time 8   Period Weeks   Status New     PT LONG TERM GOAL #4   Title amb > 250' with RW modified independent on paved outdoor/indoor surfaces for improved community access (12/30/16)   Time 8   Period Weeks   Status New     PT LONG TERM GOAL #5   Title BERG LTG to follow (12/30/16)               Plan - 12/25/16 1403    Clinical Impression Statement Patient tolerated treatment very well today. Today focused on posture and upright position with exercices and gait traing small distance today. Patient feels unbalanced with upright ambulation today yet able to be achieve upright positions with cues. Patient was educated on daily exercises for strengthening, activity tolerance and upright position to improve functional independence. Patient progresing toward goals yet ongoing due to strength and balance deficts.   Rehab Potential Fair   Clinical Impairments Affecting Rehab Potential severity of deficits   PT Frequency 2x / week   PT Duration 8 weeks   PT Treatment/Interventions ADLs/Self Care Home Management;Cryotherapy;Moist Heat;Neuromuscular re-education;Balance training;Therapeutic exercise;Therapeutic activities;Functional mobility training;Stair training;Gait training;Patient/family education;Manual techniques;Vestibular;Taping;Electrical Stimulation;Ultrasound   PT Next Visit Plan Continue with LE strengthening and general conditioning with patient hopes of ambulating with cane per MPT POC.   Consulted and Agree with Plan of Care Patient      Patient  will benefit from skilled therapeutic intervention in order to improve the following deficits and impairments:  Abnormal gait, Decreased balance, Decreased strength, Pain, Difficulty walking, Decreased mobility, Postural dysfunction  Visit Diagnosis: Unsteadiness on feet  Other abnormalities of gait and mobility  Muscle weakness (generalized)     Problem List Patient Active Problem List   Diagnosis  Date Noted  . Benign essential HTN 05/22/2014  . Age-related macular degeneration, wet, both eyes ([place]) 03/12/2014  . Internal hemorrhoids without mention of complication 06/05/1313  . Immune fecal occult blood test + x 2 01/05/2012  . Cancer of right breast ([place]) 09/28/2011  . Hearing loss 12/27/2010  . Arthritis 12/27/2010  . Hyperlipidemia   . Phlebitis and thrombophlebitis of unspecified site   . Symptomatic menopausal or female climacteric states   . Malaise and fatigue   . Palpitations   . Nonspecific abnormal electrocardiogram (ECG) (EKG)   . Hypothyroidism   . Osteoporosis   . IBS (irritable bowel syndrome)     Demetri Goshert P, PTA 12/25/2016, 2:08 PM  [facility] [place], [place], [address] Phone: [phone]   Fax:  [phone]  Name: ELECTA STERRY MRN: [id number] Date of Birth: 05/17/1922

## 2016-12-28 ENCOUNTER — Ambulatory Visit: Payer: Medicare Other | Admitting: *Deleted

## 2016-12-28 ENCOUNTER — Encounter: Payer: Self-pay | Admitting: *Deleted

## 2016-12-28 DIAGNOSIS — M6281 Muscle weakness (generalized): Secondary | ICD-10-CM | POA: Diagnosis not present

## 2016-12-28 DIAGNOSIS — R2681 Unsteadiness on feet: Secondary | ICD-10-CM | POA: Diagnosis not present

## 2016-12-28 DIAGNOSIS — R2689 Other abnormalities of gait and mobility: Secondary | ICD-10-CM | POA: Diagnosis not present

## 2016-12-28 NOTE — Therapy (Signed)
[facility] [place], [place], [address] Phone: [phone]   Fax:  [phone]  Physical Therapy Treatment  Patient Details  Name: Janice Reed MRN: [id number] Date of Birth: [birth year] Referring Provider: Redge Gainer, MD  Encounter Date: 12/28/2016      PT End of Session - 12/28/16 1309    Visit Number 7   Number of Visits 16   Date for PT Re-Evaluation 01/30/17   Authorization Type UHC Medicare   PT Start Time 1300   PT Stop Time 1351   PT Time Calculation (min) 51 min      Past Medical History:  Diagnosis Date  . Arthritis   . Arthritis pain   . Benign hypertensive heart disease   . Breast cancer ([place])   . Cancer of right breast ([place]) 09/28/2011   IDC;  T2, N0 (IHC+);   ER+;  Her-2 neg.,  Right PM,SLN  09/08/08   . Collagenous colitis   . Diverticulosis of colon   . External hemorrhoid   . Hearing loss   . Hypertension   . IBS (irritable bowel syndrome)   . Macular degeneration of both eyes   . Neuropathy, peripheral   . Nonspecific abnormal electrocardiogram (ECG) (EKG)   . Osteoporosis   . Other and unspecified hyperlipidemia   . Palpitations   . Phlebitis and thrombophlebitis of unspecified site   . Prolapse of vaginal walls without mention of uterine prolapse   . Shingles   . Symptomatic menopausal or female climacteric states   . Unspecified hypothyroidism     Past Surgical History:  Procedure Laterality Date  . BACK SURGERY    . BREAST LUMPECTOMY     per medical history form dated 09/27/09.  [name] Kitchen CARPAL TUNNEL RELEASE     bilateral  . COLONOSCOPY  12/03/2002   Dr. Silvano Rusk  . NEUROPLASTY / TRANSPOSITION MEDIAN NERVE AT CARPAL TUNNEL BILATERAL    . TONSILLECTOMY AND ADENOIDECTOMY    . VAGINAL HYSTERECTOMY      There were no vitals filed for this visit.      Subjective Assessment - 12/28/16 1308    Subjective Patient reported being sore after last tretment, no falls reported still    Pertinent History arthritis, osteoporosis, hx breast cancer, HLD, HTN, macular degeneration, neuropathy   Limitations Walking   Currently in Pain? No/denies                         [facility] Adult PT Treatment/Exercise - 12/28/16 0001      Exercises   Exercises Knee/Hip;Lumbar     Knee/Hip Exercises: Aerobic   Nustep 65mn L3-4 UE/LE activity, monitored for progression     Knee/Hip Exercises: Standing   Heel Raises Both;2 sets;10 reps   Hip Flexion AROM;Both;2 sets;10 reps;Knee bent   Hip Abduction AROM;Both;2 sets;10 reps;Knee straight     Knee/Hip Exercises: Seated   Long Arc Quad Strengthening;Both;3 sets;10 reps;Weights   Long Arc Quad Limitations 3   Clamshell with TheraBand Green  x20 each side   Marching Strengthening;Both;10 reps;Weights;3 sets   Marching Weights 3 lbs.   Sit to Sand 5 reps;with UE support;2 sets                  PT Short Term Goals - 12/05/16 1504      PT SHORT TERM GOAL #1   Title verbalize understanding of fall prevention strategies (01/02/17)   Time 4   Period  Weeks   Status New     PT SHORT TERM GOAL #2   Title improve gait velocity to > 1.8 ft/sec for improved function and decreased fall risk (01/02/17)   Time 4   Period Weeks   Status New     PT SHORT TERM GOAL #3   Title perform at least 3 reps of sit to/from stand without UE support from chair height for improved functional strength (01/02/17)   Time 4   Period Weeks   Status New     PT SHORT TERM GOAL #4   Title perform BERG and write appropriate LTG (01/02/17)   Time 4   Period Weeks   Status New           PT Long Term Goals - 12/05/16 1505      PT LONG TERM GOAL #1   Title independent with HEP (12/30/16)   Time 8   Period Weeks   Status New     PT LONG TERM GOAL #2   Title improve gait velocity to > 2.1 ft/sec for improved function and community access (12/30/16)   Time 8   Period Weeks   Status New     PT LONG TERM GOAL #3   Title  perform 5x STS without UE support in < 20 sec for improved functional strength (12/30/16)   Time 8   Period Weeks   Status New     PT LONG TERM GOAL #4   Title amb > 250' with RW modified independent on paved outdoor/indoor surfaces for improved community access (12/30/16)   Time 8   Period Weeks   Status New     PT LONG TERM GOAL #5   Title BERG LTG to follow (12/30/16)               Plan - 12/28/16 1358    Clinical Impression Statement Pt arrived to clinic today and was ambulating with FWW in flexed posture. She did well with with Rx today and continues to need cues for upright posture during exercises She did better with sit to stand today with less UE use. She does better  with cues for technique nose over toes. Pt continues to progress towards goals   Rehab Potential Fair   Clinical Impairments Affecting Rehab Potential severity of deficits   PT Frequency 2x / week   PT Duration 8 weeks   PT Treatment/Interventions ADLs/Self Care Home Management;Cryotherapy;Moist Heat;Neuromuscular re-education;Balance training;Therapeutic exercise;Therapeutic activities;Functional mobility training;Stair training;Gait training;Patient/family education;Manual techniques;Vestibular;Taping;Electrical Stimulation;Ultrasound   PT Next Visit Plan Continue with LE strengthening and general conditioning with patient hopes of ambulating with cane per MPT POC.   PT Home Exercise Plan HEP- knee flex and ext with red theraband, seated clam, standing hip abduction, ankle pumps   Consulted and Agree with Plan of Care Patient      Patient will benefit from skilled therapeutic intervention in order to improve the following deficits and impairments:  Abnormal gait, Decreased balance, Decreased strength, Pain, Difficulty walking, Decreased mobility, Postural dysfunction  Visit Diagnosis: Unsteadiness on feet  Other abnormalities of gait and mobility  Muscle weakness (generalized)     Problem  List Patient Active Problem List   Diagnosis Date Noted  . Benign essential HTN 05/22/2014  . Age-related macular degeneration, wet, both eyes ([place]) 03/12/2014  . Internal hemorrhoids without mention of complication 19/37/9024  . Immune fecal occult blood test + x 2 01/05/2012  . Cancer of right breast ([place]) 09/28/2011  . Hearing  loss 12/27/2010  . Arthritis 12/27/2010  . Hyperlipidemia   . Phlebitis and thrombophlebitis of unspecified site   . Symptomatic menopausal or female climacteric states   . Malaise and fatigue   . Palpitations   . Nonspecific abnormal electrocardiogram (ECG) (EKG)   . Hypothyroidism   . Osteoporosis   . IBS (irritable bowel syndrome)     ,CHRIS, PTA 12/28/2016, 2:22 PM  [facility] [address] [place], [place], [address] Phone: [phone]   Fax:  [phone]  Name: HAYDYN LIDDELL MRN: [id number] Date of Birth: [birth year]

## 2017-01-02 ENCOUNTER — Encounter: Payer: Self-pay | Admitting: Physical Therapy

## 2017-01-02 ENCOUNTER — Ambulatory Visit: Payer: Medicare Other | Admitting: Physical Therapy

## 2017-01-02 DIAGNOSIS — R2689 Other abnormalities of gait and mobility: Secondary | ICD-10-CM

## 2017-01-02 DIAGNOSIS — M6281 Muscle weakness (generalized): Secondary | ICD-10-CM

## 2017-01-02 DIAGNOSIS — R2681 Unsteadiness on feet: Secondary | ICD-10-CM

## 2017-01-02 NOTE — Therapy (Signed)
[facility] [place], [place], [address] Phone: [phone]   Fax:  [phone]  Physical Therapy Treatment  Patient Details  Name: Janice Reed MRN: [id number] Date of Birth: 1921-08-20 Referring Provider: Redge Gainer, MD  Encounter Date: 01/02/2017      PT End of Session - 01/02/17 1357    Visit Number 8   Number of Visits 16   Date for PT Re-Evaluation 01/30/17   Authorization Type UHC Medicare   PT Start Time 1346   PT Stop Time 1430   PT Time Calculation (min) 44 min   Activity Tolerance Patient tolerated treatment well   Behavior During Therapy [facility] for tasks assessed/performed      Past Medical History:  Diagnosis Date  . Arthritis   . Arthritis pain   . Benign hypertensive heart disease   . Breast cancer ([place])   . Cancer of right breast ([place]) 09/28/2011   IDC;  T2, N0 (IHC+);   ER+;  Her-2 neg.,  Right PM,SLN  09/08/08   . Collagenous colitis   . Diverticulosis of colon   . External hemorrhoid   . Hearing loss   . Hypertension   . IBS (irritable bowel syndrome)   . Macular degeneration of both eyes   . Neuropathy, peripheral   . Nonspecific abnormal electrocardiogram (ECG) (EKG)   . Osteoporosis   . Other and unspecified hyperlipidemia   . Palpitations   . Phlebitis and thrombophlebitis of unspecified site   . Prolapse of vaginal walls without mention of uterine prolapse   . Shingles   . Symptomatic menopausal or female climacteric states   . Unspecified hypothyroidism     Past Surgical History:  Procedure Laterality Date  . BACK SURGERY    . BREAST LUMPECTOMY     per medical history form dated 09/27/09.  [name] Kitchen CARPAL TUNNEL RELEASE     bilateral  . COLONOSCOPY  12/03/2002   Dr. Silvano Rusk  . NEUROPLASTY / TRANSPOSITION MEDIAN NERVE AT CARPAL TUNNEL BILATERAL    . TONSILLECTOMY AND ADENOIDECTOMY    . VAGINAL HYSTERECTOMY      There were no vitals filed for this visit.      Subjective  Assessment - 01/02/17 1346    Subjective Reports LEs feel very weak today.   Pertinent History arthritis, osteoporosis, hx breast cancer, HLD, HTN, macular degeneration, neuropathy   Limitations Walking   Patient Stated Goals improve joint pain (MD feels this is related to arthritis)   Currently in Pain? No/denies            [facility] PT Assessment - 01/02/17 0001      Assessment   Medical Diagnosis weakness, hx of falls   Next MD Visit 03/19/17   Prior Therapy several times - unrelated to fall     Precautions   Precautions Fall     Restrictions   Weight Bearing Restrictions No                     [facility] Adult PT Treatment/Exercise - 01/02/17 0001      Knee/Hip Exercises: Aerobic   Nustep L5 x15 min     Knee/Hip Exercises: Standing   Heel Raises Both;2 sets;10 reps   Hip Flexion AROM;Both;2 sets;10 reps;Knee bent   Hip Abduction AROM;Both;2 sets;10 reps;Knee straight   Forward Step Up Both;2 sets;10 reps;Hand Hold: 2;Step Height: 6"     Knee/Hip Exercises: Seated   Sit to Sand 10 reps;with UE support  UE support for descent             Balance Exercises - 01/02/17 1438      Balance Exercises: Standing   Standing Eyes Opened Foam/compliant surface  sidestep with close UE support on // bars x2 min   Tandem Stance Eyes open;Foam/compliant surface;Intermittent upper extremity support  x1 rep of 2 min each   Standing, One Foot on a Step Eyes open;6 inch;1 rep  x2 min each             PT Short Term Goals - 12/05/16 1504      PT SHORT TERM GOAL #1   Title verbalize understanding of fall prevention strategies (01/02/17)   Time 4   Period Weeks   Status New     PT SHORT TERM GOAL #2   Title improve gait velocity to > 1.8 ft/sec for improved function and decreased fall risk (01/02/17)   Time 4   Period Weeks   Status New     PT SHORT TERM GOAL #3   Title perform at least 3 reps of sit to/from stand without UE support from chair height for  improved functional strength (01/02/17)   Time 4   Period Weeks   Status New     PT SHORT TERM GOAL #4   Title perform BERG and write appropriate LTG (01/02/17)   Time 4   Period Weeks   Status New           PT Long Term Goals - 12/05/16 1505      PT LONG TERM GOAL #1   Title independent with HEP (12/30/16)   Time 8   Period Weeks   Status New     PT LONG TERM GOAL #2   Title improve gait velocity to > 2.1 ft/sec for improved function and community access (12/30/16)   Time 8   Period Weeks   Status New     PT LONG TERM GOAL #3   Title perform 5x STS without UE support in < 20 sec for improved functional strength (12/30/16)   Time 8   Period Weeks   Status New     PT LONG TERM GOAL #4   Title amb > 250' with RW modified independent on paved outdoor/indoor surfaces for improved community access (12/30/16)   Time 8   Period Weeks   Status New     PT LONG TERM GOAL #5   Title BERG LTG to follow (12/30/16)               Plan - 01/02/17 1445    Clinical Impression Statement Patient arrived to clinic today with reports of LE weakness for an unknown reason. Patient able to complete exercises with one UE support for hip strengthening and SLS activites. Patient required VCs for erect stance during treatment today. Sit to stands completed today with UE assist from stand to sit secondary to R knee discomfort. Narrow BOS exercises difficult for patient mostly. Height of FWW increased to allow patient more erect stance with ambulation.   Rehab Potential Fair   Clinical Impairments Affecting Rehab Potential severity of deficits   PT Frequency 2x / week   PT Duration 8 weeks   PT Treatment/Interventions ADLs/Self Care Home Management;Cryotherapy;Moist Heat;Neuromuscular re-education;Balance training;Therapeutic exercise;Therapeutic activities;Functional mobility training;Stair training;Gait training;Patient/family education;Manual techniques;Vestibular;Taping;Electrical  Stimulation;Ultrasound   PT Next Visit Plan Continue with LE strengthening and general conditioning with patient hopes of ambulating with cane per MPT POC.   PT Home  Exercise Plan HEP- knee flex and ext with red theraband, seated clam, standing hip abduction, ankle pumps   Consulted and Agree with Plan of Care Patient      Patient will benefit from skilled therapeutic intervention in order to improve the following deficits and impairments:  Abnormal gait, Decreased balance, Decreased strength, Pain, Difficulty walking, Decreased mobility, Postural dysfunction  Visit Diagnosis: Unsteadiness on feet  Other abnormalities of gait and mobility  Muscle weakness (generalized)     Problem List Patient Active Problem List   Diagnosis Date Noted  . Benign essential HTN 05/22/2014  . Age-related macular degeneration, wet, both eyes ([place]) 03/12/2014  . Internal hemorrhoids without mention of complication 68/37/2902  . Immune fecal occult blood test + x 2 01/05/2012  . Cancer of right breast ([place]) 09/28/2011  . Hearing loss 12/27/2010  . Arthritis 12/27/2010  . Hyperlipidemia   . Phlebitis and thrombophlebitis of unspecified site   . Symptomatic menopausal or female climacteric states   . Malaise and fatigue   . Palpitations   . Nonspecific abnormal electrocardiogram (ECG) (EKG)   . Hypothyroidism   . Osteoporosis   . IBS (irritable bowel syndrome)     Wynelle Fanny, PTA 01/02/2017, 2:49 PM  [facility] [address] [place], [place], [address] Phone: [phone]   Fax:  [phone]  Name: Janice Reed MRN: [id number] Date of Birth: 06/08/1922

## 2017-01-04 ENCOUNTER — Ambulatory Visit: Payer: Medicare Other | Admitting: Physical Therapy

## 2017-01-04 ENCOUNTER — Encounter: Payer: Self-pay | Admitting: Physical Therapy

## 2017-01-04 DIAGNOSIS — R2689 Other abnormalities of gait and mobility: Secondary | ICD-10-CM | POA: Diagnosis not present

## 2017-01-04 DIAGNOSIS — R2681 Unsteadiness on feet: Secondary | ICD-10-CM | POA: Diagnosis not present

## 2017-01-04 DIAGNOSIS — M6281 Muscle weakness (generalized): Secondary | ICD-10-CM

## 2017-01-04 NOTE — Therapy (Signed)
[facility] [place], [place], [address] Phone: [phone]   Fax:  [phone]  Physical Therapy Treatment  Patient Details  Name: Janice Reed MRN: [id number] Date of Birth: 03/11/1922 Referring Provider: Redge Gainer, MD  Encounter Date: 01/04/2017      PT End of Session - 01/04/17 1358    Visit Number 9   Number of Visits 16   Date for PT Re-Evaluation 01/30/17   Authorization Type U[employer] Medicare   PT Start Time 1315   PT Stop Time 1358   PT Time Calculation (min) 43 min   Activity Tolerance Patient tolerated treatment well   Behavior During Therapy [facility] for tasks assessed/performed      Past Medical History:  Diagnosis Date  . Arthritis   . Arthritis pain   . Benign hypertensive heart disease   . Breast cancer ([place])   . Cancer of right breast ([place]) 09/28/2011   IDC;  T2, N0 (IHC+);   ER+;  Her-2 neg.,  Right PM,SLN  09/08/08   . Collagenous colitis   . Diverticulosis of colon   . External hemorrhoid   . Hearing loss   . Hypertension   . IBS (irritable bowel syndrome)   . Macular degeneration of both eyes   . Neuropathy, peripheral   . Nonspecific abnormal electrocardiogram (ECG) (EKG)   . Osteoporosis   . Other and unspecified hyperlipidemia   . Palpitations   . Phlebitis and thrombophlebitis of unspecified site   . Prolapse of vaginal walls without mention of uterine prolapse   . Shingles   . Symptomatic menopausal or female climacteric states   . Unspecified hypothyroidism     Past Surgical History:  Procedure Laterality Date  . BACK SURGERY    . BREAST LUMPECTOMY     per medical history form dated 09/27/09.  [name] Kitchen CARPAL TUNNEL RELEASE     bilateral  . COLONOSCOPY  12/03/2002   Dr. Silvano Rusk  . NEUROPLASTY / TRANSPOSITION MEDIAN NERVE AT CARPAL TUNNEL BILATERAL    . TONSILLECTOMY AND ADENOIDECTOMY    . VAGINAL HYSTERECTOMY      There were no vitals filed for this visit.      Subjective  Assessment - 01/04/17 1325    Subjective Patient arrived feeling fatigue today   Pertinent History arthritis, osteoporosis, hx breast cancer, HLD, HTN, macular degeneration, neuropathy   Limitations Walking   Patient Stated Goals improve joint pain (MD feels this is related to arthritis)   Currently in Pain? No/denies                         [facility] Adult PT Treatment/Exercise - 01/04/17 0001      Lumbar Exercises: Seated   Other Seated Lumbar Exercises half lumbar roll behind back for "V" and "W" 2x10     Knee/Hip Exercises: Aerobic   Nustep L5 x15 min UE/LE activity, monitored for progression     Knee/Hip Exercises: Standing   Other Standing Knee Exercises seated horiz abd with yellow band x10     Knee/Hip Exercises: Seated   Long Arc Quad Strengthening;Both;3 sets;10 reps;Weights   Long Arc Quad Limitations 3   Clamshell with Thera[employer]  x30   Marching Strengthening;Both;10 reps;Weights;3 sets   [employer] 3 lbs.   Sit to Sand 10 reps;with UE support  fatigue today     Knee/Hip Exercises: Supine   Other Supine Knee/Hip Exercises seated lumbar ext with overhead "v" with  pink XTS resitance x15                  PT Short Term Goals - 01/04/17 1345      PT SHORT TERM GOAL #1   Title verbalize understanding of fall prevention strategies (01/02/17)   Time 4   Period Weeks   Status Achieved     PT SHORT TERM GOAL #2   Title improve gait velocity to > 1.8 ft/sec for improved function and decreased fall risk (01/02/17)   Time 4   Period Weeks   Status On-going     PT SHORT TERM GOAL #3   Title perform at least 3 reps of sit to/from stand without UE support from chair height for improved functional strength (01/02/17)   Time 4   Period Weeks   Status On-going     PT SHORT TERM GOAL #4   Title perform BERG and write appropriate LTG (01/02/17)   Time 4   Period Weeks   Status On-going           PT Long Term Goals - 01/04/17 1346       PT LONG TERM GOAL #1   Title independent with HEP (12/30/16)   Time 8   Period Weeks   Status On-going     PT LONG TERM GOAL #2   Title improve gait velocity to > 2.1 ft/sec for improved function and community access (12/30/16)   Time 8   Period Weeks   Status On-going     PT LONG TERM GOAL #3   Title perform 5x STS without UE support in < 20 sec for improved functional strength (12/30/16)   Time 8   Period Weeks   Status On-going     PT LONG TERM GOAL #4   Title amb > 250' with RW modified independent on paved outdoor/indoor surfaces for improved community access (12/30/16)   Time 8   Period Weeks   Status On-going     PT LONG TERM GOAL #5   Title BERG LTG to follow (12/30/16)   Status On-going               Plan - 01/04/17 1358    Clinical Impression Statement Patient tolerated treatment well today. Patient had some difficulty with sit to stands due to fatigue. Patient able to verbalize fall prevention. Patient able to perform her ADL's with ease per reported. Patient met STG #1 other goals ongoing due to strength, gait and activity tolerance deficts.   Rehab Potential Fair   Clinical Impairments Affecting Rehab Potential severity of deficits   PT Frequency 2x / week   PT Duration 8 weeks   PT Treatment/Interventions ADLs/Self Care Home Management;Cryotherapy;Moist Heat;Neuromuscular re-education;Balance training;Therapeutic exercise;Therapeutic activities;Functional mobility training;Stair training;Gait training;Patient/family education;Manual techniques;Vestibular;Taping;Electrical Stimulation;Ultrasound   PT Next Visit Plan Continue with LE strengthening and general conditioning with patient hopes of ambulating with cane per MPT POC.      Patient will benefit from skilled therapeutic intervention in order to improve the following deficits and impairments:  Abnormal gait, Decreased balance, Decreased strength, Pain, Difficulty walking, Decreased mobility, Postural  dysfunction  Visit Diagnosis: Unsteadiness on feet  Other abnormalities of gait and mobility  Muscle weakness (generalized)     Problem List Patient Active Problem List   Diagnosis Date Noted  . Benign essential HTN 05/22/2014  . Age-related macular degeneration, wet, both eyes ([place]) 03/12/2014  . Internal hemorrhoids without mention of complication 63/87/5643  . Immune fecal occult blood  test + x 2 01/05/2012  . Cancer of right breast ([place]) 09/28/2011  . Hearing loss 12/27/2010  . Arthritis 12/27/2010  . Hyperlipidemia   . Phlebitis and thrombophlebitis of unspecified site   . Symptomatic menopausal or female climacteric states   . Malaise and fatigue   . Palpitations   . Nonspecific abnormal electrocardiogram (ECG) (EKG)   . Hypothyroidism   . Osteoporosis   . IBS (irritable bowel syndrome)     DUNFORD, CHRISTINA P, PTA 01/04/2017, 2:01 PM  [facility] [place], [place], [address] Phone: [phone]   Fax:  [phone]  Name: ZAMYIAH TINO MRN: [id number] Date of Birth: [birth year]

## 2017-01-08 ENCOUNTER — Ambulatory Visit: Payer: Medicare Other | Attending: Family Medicine | Admitting: *Deleted

## 2017-01-08 DIAGNOSIS — R2689 Other abnormalities of gait and mobility: Secondary | ICD-10-CM | POA: Diagnosis not present

## 2017-01-08 DIAGNOSIS — R2681 Unsteadiness on feet: Secondary | ICD-10-CM | POA: Insufficient documentation

## 2017-01-08 DIAGNOSIS — M6281 Muscle weakness (generalized): Secondary | ICD-10-CM | POA: Diagnosis not present

## 2017-01-08 NOTE — Therapy (Signed)
[facility] [place], [place], [address] Phone: [phone]   Fax:  [phone]  Physical Therapy Treatment  Patient Details  Name: Janice Reed MRN: [id number] Date of Birth: 03-16-1922 Referring Provider: Redge Gainer, MD  Encounter Date: 01/08/2017      PT End of Session - 01/08/17 1531    Visit Number 10   Number of Visits 16   Date for PT Re-Evaluation 01/30/17   Authorization Type UHC Medicare   PT Start Time 1430   PT Stop Time 1520   PT Time Calculation (min) 50 min      Past Medical History:  Diagnosis Date  . Arthritis   . Arthritis pain   . Benign hypertensive heart disease   . Breast cancer ([place])   . Cancer of right breast ([place]) 09/28/2011   IDC;  T2, N0 (IHC+);   ER+;  Her-2 neg.,  Right PM,SLN  09/08/08   . Collagenous colitis   . Diverticulosis of colon   . External hemorrhoid   . Hearing loss   . Hypertension   . IBS (irritable bowel syndrome)   . Macular degeneration of both eyes   . Neuropathy, peripheral   . Nonspecific abnormal electrocardiogram (ECG) (EKG)   . Osteoporosis   . Other and unspecified hyperlipidemia   . Palpitations   . Phlebitis and thrombophlebitis of unspecified site   . Prolapse of vaginal walls without mention of uterine prolapse   . Shingles   . Symptomatic menopausal or female climacteric states   . Unspecified hypothyroidism     Past Surgical History:  Procedure Laterality Date  . BACK SURGERY    . BREAST LUMPECTOMY     per medical history form dated 09/27/09.  [name] Kitchen CARPAL TUNNEL RELEASE     bilateral  . COLONOSCOPY  12/03/2002   Dr. Silvano Rusk  . NEUROPLASTY / TRANSPOSITION MEDIAN NERVE AT CARPAL TUNNEL BILATERAL    . TONSILLECTOMY AND ADENOIDECTOMY    . VAGINAL HYSTERECTOMY      There were no vitals filed for this visit.      Subjective Assessment - 01/08/17 1431    Subjective Pt feeling better today with more energy.   Pertinent History arthritis,  osteoporosis, hx breast cancer, HLD, HTN, macular degeneration, neuropathy   Limitations Walking   Patient Stated Goals improve joint pain (MD feels this is related to arthritis)   Currently in Pain? No/denies                         [facility] Adult PT Treatment/Exercise - 01/08/17 0001      Lumbar Exercises: Standing   Heel Raises 20 reps  toe raises x 20     Lumbar Exercises: Seated   Other Seated Lumbar Exercises half lumbar roll behind back for "V" and "W" 2x10, ROWs with yellow tband 2x10     Knee/Hip Exercises: Aerobic   Nustep L5 x15 min UE/LE activity, monitored for progression  and focus on posture     Knee/Hip Exercises: Standing   Hip Flexion AROM;Both;2 sets;10 reps;Knee bent   Forward Lunges --  for hip flexion stretching  x 4 each side with cues for post     Knee/Hip Exercises: Seated   Long Arc Quad Strengthening;Both;3 sets;10 reps;Weights   Long Arc Quad Limitations 4   Clamshell with TheraBand Yellow  4x10   Marching Strengthening;Both;10 reps;Weights;3 sets   Marching Weights 4 lbs.   Sit to [employer]  10 reps;with UE support                  PT Short Term Goals - 01/04/17 1345      PT SHORT TERM GOAL #1   Title verbalize understanding of fall prevention strategies (01/02/17)   Time 4   Period Weeks   Status Achieved     PT SHORT TERM GOAL #2   Title improve gait velocity to > 1.8 ft/sec for improved function and decreased fall risk (01/02/17)   Time 4   Period Weeks   Status On-going     PT SHORT TERM GOAL #3   Title perform at least 3 reps of sit to/from stand without UE support from chair height for improved functional strength (01/02/17)   Time 4   Period Weeks   Status On-going     PT SHORT TERM GOAL #4   Title perform BERG and write appropriate LTG (01/02/17)   Time 4   Period Weeks   Status On-going           PT Long Term Goals - 01/04/17 1346      PT LONG TERM GOAL #1   Title independent with HEP (12/30/16)    Time 8   Period Weeks   Status On-going     PT LONG TERM GOAL #2   Title improve gait velocity to > 2.1 ft/sec for improved function and community access (12/30/16)   Time 8   Period Weeks   Status On-going     PT LONG TERM GOAL #3   Title perform 5x STS without UE support in < 20 sec for improved functional strength (12/30/16)   Time 8   Period Weeks   Status On-going     PT LONG TERM GOAL #4   Title amb > 250' with RW modified independent on paved outdoor/indoor surfaces for improved community access (12/30/16)   Time 8   Period Weeks   Status On-going     PT LONG TERM GOAL #5   Title BERG LTG to follow (12/30/16)   Status On-going               Plan - 01/08/17 1542    Clinical Impression Statement Pt arrived to clinic today doing fairly well with more energy than last Rx. She was able to perform therex and act.'s today with less rest b/w each ex. She needs v/c's throughout Rx for posture and during gait with FWW.[name] Kitchen She still needs UE assist to go from sit to stand and for descent.. Standing Hip flexor stretching to aid with standing posture   Clinical Impairments Affecting Rehab Potential severity of deficits   PT Frequency 2x / week   PT Duration 8 weeks   PT Treatment/Interventions ADLs/Self Care Home Management;Cryotherapy;Moist Heat;Neuromuscular re-education;Balance training;Therapeutic exercise;Therapeutic activities;Functional mobility training;Stair training;Gait training;Patient/family education;Manual techniques;Vestibular;Taping;Electrical Stimulation;Ultrasound   PT Next Visit Plan Continue with LE strengthening and general conditioning with patient hopes of ambulating with cane per MPT POC.   PT Home Exercise Plan HEP- knee flex and ext with red theraband, seated clam, standing hip abduction, ankle pumps   Consulted and Agree with Plan of Care Patient      Patient will benefit from skilled therapeutic intervention in order to improve the following deficits  and impairments:  Abnormal gait, Decreased balance, Decreased strength, Pain, Difficulty walking, Decreased mobility, Postural dysfunction  Visit Diagnosis: Unsteadiness on feet  Other abnormalities of gait and mobility  Muscle weakness (generalized)  Problem List Patient Active Problem List   Diagnosis Date Noted  . Benign essential HTN 05/22/2014  . Age-related macular degeneration, wet, both eyes ([place]) 03/12/2014  . Internal hemorrhoids without mention of complication 65/10/5463  . Immune fecal occult blood test + x 2 01/05/2012  . Cancer of right breast ([place]) 09/28/2011  . Hearing loss 12/27/2010  . Arthritis 12/27/2010  . Hyperlipidemia   . Phlebitis and thrombophlebitis of unspecified site   . Symptomatic menopausal or female climacteric states   . Malaise and fatigue   . Palpitations   . Nonspecific abnormal electrocardiogram (ECG) (EKG)   . Hypothyroidism   . Osteoporosis   . IBS (irritable bowel syndrome)     Mahayla Haddaway,CHRIS, PTA  01/08/2017, 3:57 PM  [facility] [place], [place], [address] Phone: [phone]   Fax:  [phone]  Name: Janice Reed MRN: [id number] Date of Birth: 1922-03-23

## 2017-01-11 ENCOUNTER — Encounter: Payer: Self-pay | Admitting: Physical Therapy

## 2017-01-11 ENCOUNTER — Ambulatory Visit: Payer: Medicare Other | Admitting: Physical Therapy

## 2017-01-11 DIAGNOSIS — R2689 Other abnormalities of gait and mobility: Secondary | ICD-10-CM

## 2017-01-11 DIAGNOSIS — R2681 Unsteadiness on feet: Secondary | ICD-10-CM | POA: Diagnosis not present

## 2017-01-11 DIAGNOSIS — M6281 Muscle weakness (generalized): Secondary | ICD-10-CM

## 2017-01-11 NOTE — Therapy (Signed)
[facility] [place], [place], [address] Phone: [phone]   Fax:  [phone]  Physical Therapy Treatment  Patient Details  Name: Janice Reed MRN: [id number] Date of Birth: 1921/09/01 Referring Provider: Redge Gainer, MD  Encounter Date: 01/11/2017      PT End of Session - 01/11/17 1427    Visit Number 11   Number of Visits 16   Date for PT Re-Evaluation 01/30/17   Authorization Type UHC Medicare   PT Start Time 1400   PT Stop Time 1443   PT Time Calculation (min) 43 min   Activity Tolerance Patient tolerated treatment well   Behavior During Therapy [facility] for tasks assessed/performed      Past Medical History:  Diagnosis Date  . Arthritis   . Arthritis pain   . Benign hypertensive heart disease   . Breast cancer ([place])   . Cancer of right breast ([place]) 09/28/2011   IDC;  T2, N0 (IHC+);   ER+;  Her-2 neg.,  Right PM,SLN  09/08/08   . Collagenous colitis   . Diverticulosis of colon   . External hemorrhoid   . Hearing loss   . Hypertension   . IBS (irritable bowel syndrome)   . Macular degeneration of both eyes   . Neuropathy, peripheral   . Nonspecific abnormal electrocardiogram (ECG) (EKG)   . Osteoporosis   . Other and unspecified hyperlipidemia   . Palpitations   . Phlebitis and thrombophlebitis of unspecified site   . Prolapse of vaginal walls without mention of uterine prolapse   . Shingles   . Symptomatic menopausal or female climacteric states   . Unspecified hypothyroidism     Past Surgical History:  Procedure Laterality Date  . BACK SURGERY    . BREAST LUMPECTOMY     per medical history form dated 09/27/09.  [name] Kitchen CARPAL TUNNEL RELEASE     bilateral  . COLONOSCOPY  12/03/2002   Dr. Silvano Rusk  . NEUROPLASTY / TRANSPOSITION MEDIAN NERVE AT CARPAL TUNNEL BILATERAL    . TONSILLECTOMY AND ADENOIDECTOMY    . VAGINAL HYSTERECTOMY      There were no vitals filed for this visit.      Subjective  Assessment - 01/11/17 1410    Subjective Patient arrived feeling well today and no complaints after last treatment   Pertinent History arthritis, osteoporosis, hx breast cancer, HLD, HTN, macular degeneration, neuropathy   Limitations Walking   Patient Stated Goals improve joint pain (MD feels this is related to arthritis)   Currently in Pain? No/denies                         [facility] Adult PT Treatment/Exercise - 01/11/17 0001      Ambulation/Gait   Ambulation/Gait Yes   Ambulation/Gait Assistance 6: Modified independent (Device/Increase time)   Ambulation Distance (Feet) 180 Feet   Assistive device Rolling walker   Gait Pattern Decreased stride length;Decreased trunk rotation;Trunk flexed   Ambulation Surface Indoor;Level   Gait Comments cues for upright posture and step length with pace for control     Lumbar Exercises: Seated   Other Seated Lumbar Exercises half lumbar roll behind back for "V" and "W" 2x10, horiz abd with yellow tband 2x10     Knee/Hip Exercises: Aerobic   Nustep L5 x15 min UE/LE activity, monitored for progression  and focus on posture     Knee/Hip Exercises: Seated   Long Arc Quad Strengthening;Both;3 sets;10 reps;Weights  Long Arc [employer] Limitations 4   Clamshell with [employer]   Marching Strengthening;Both;10 reps;Weights;3 sets   [employer] 4 lbs.   Sit to Sand with UE support;10 reps  attempted without UE support yet unable                  PT Short Term Goals - 01/04/17 1345      PT SHORT TERM GOAL #1   Title verbalize understanding of fall prevention strategies (01/02/17)   Time 4   Period Weeks   Status Achieved     PT SHORT TERM GOAL #2   Title improve gait velocity to > 1.8 ft/sec for improved function and decreased fall risk (01/02/17)   Time 4   Period Weeks   Status On-going     PT SHORT TERM GOAL #3   Title perform at least 3 reps of sit to/from stand without UE support from chair height for  improved functional strength (01/02/17)   Time 4   Period Weeks   Status On-going     PT SHORT TERM GOAL #4   Title perform BERG and write appropriate LTG (01/02/17)   Time 4   Period Weeks   Status On-going           PT Long Term Goals - 01/04/17 1346      PT LONG TERM GOAL #1   Title independent with HEP (12/30/16)   Time 8   Period Weeks   Status On-going     PT LONG TERM GOAL #2   Title improve gait velocity to > 2.1 ft/sec for improved function and community access (12/30/16)   Time 8   Period Weeks   Status On-going     PT LONG TERM GOAL #3   Title perform 5x STS without UE support in < 20 sec for improved functional strength (12/30/16)   Time 8   Period Weeks   Status On-going     PT LONG TERM GOAL #4   Title amb > 250' with RW modified independent on paved outdoor/indoor surfaces for improved community access (12/30/16)   Time 8   Period Weeks   Status On-going     PT LONG TERM GOAL #5   Title BERG LTG to follow (12/30/16)   Status On-going               Plan - 01/11/17 1428    Clinical Impression Statement Patient progressing with all activities today. Paient able to tolerate all exercises without fatigue. Patient able to corrent posture with cues yet returns to forward flexed posture. Patient did well with gait training today. Patient required cues for pace and posture. Patient progressing toward goals yet ongoing due to deficts.    Rehab Potential Fair   Clinical Impairments Affecting Rehab Potential severity of deficits   PT Frequency 2x / week   PT Duration 8 weeks   PT Treatment/Interventions ADLs/Self Care Home Management;Cryotherapy;Moist Heat;Neuromuscular re-education;Balance training;Therapeutic exercise;Therapeutic activities;Functional mobility training;Stair training;Gait training;Patient/family education;Manual techniques;Vestibular;Taping;Electrical Stimulation;Ultrasound   PT Next Visit Plan Continue with LE strengthening and general  conditioning with patient hopes of ambulating with cane per MPT POC.   Consulted and Agree with Plan of Care Patient      Patient will benefit from skilled therapeutic intervention in order to improve the following deficits and impairments:  Abnormal gait, Decreased balance, Decreased strength, Pain, Difficulty walking, Decreased mobility, Postural dysfunction  Visit Diagnosis: Unsteadiness on feet  Other abnormalities of gait and mobility  Muscle weakness (generalized)     Problem List Patient Active Problem List   Diagnosis Date Noted  . Benign essential HTN 05/22/2014  . Age-related macular degeneration, wet, both eyes ([place]) 03/12/2014  . Internal hemorrhoids without mention of complication 40/99/2780  . Immune fecal occult blood test + x 2 01/05/2012  . Cancer of right breast ([place]) 09/28/2011  . Hearing loss 12/27/2010  . Arthritis 12/27/2010  . Hyperlipidemia   . Phlebitis and thrombophlebitis of unspecified site   . Symptomatic menopausal or female climacteric states   . Malaise and fatigue   . Palpitations   . Nonspecific abnormal electrocardiogram (ECG) (EKG)   . Hypothyroidism   . Osteoporosis   . IBS (irritable bowel syndrome)     DUNFORD, CHRISTINA P, PTA 01/11/2017, 2:45 PM  [facility] [place], [place], [address] Phone: [phone]   Fax:  [phone]  Name: Janice Reed MRN: [id number] Date of Birth: 07-Jul-1922

## 2017-01-15 ENCOUNTER — Encounter: Payer: Self-pay | Admitting: Physical Therapy

## 2017-01-15 ENCOUNTER — Ambulatory Visit: Payer: Medicare Other | Admitting: Physical Therapy

## 2017-01-15 DIAGNOSIS — R2681 Unsteadiness on feet: Secondary | ICD-10-CM

## 2017-01-15 DIAGNOSIS — R2689 Other abnormalities of gait and mobility: Secondary | ICD-10-CM | POA: Diagnosis not present

## 2017-01-15 DIAGNOSIS — M6281 Muscle weakness (generalized): Secondary | ICD-10-CM | POA: Diagnosis not present

## 2017-01-15 NOTE — Therapy (Signed)
[facility] [place], [place], [address] Phone: [phone]   Fax:  [phone]  Physical Therapy Treatment  Patient Details  Name: Janice Reed MRN: [id number] Date of Birth: [birth year] Referring Provider: Redge Gainer, MD  Encounter Date: 01/15/2017      PT End of Session - 01/15/17 1433    Visit Number 12   Number of Visits 16   Date for PT Re-Evaluation 01/30/17   Authorization Type UHC Medicare   PT Start Time 1430   PT Stop Time 1512   PT Time Calculation (min) 42 min   Activity Tolerance Patient tolerated treatment well   Behavior During Therapy [facility] for tasks assessed/performed      Past Medical History:  Diagnosis Date  . Arthritis   . Arthritis pain   . Benign hypertensive heart disease   . Breast cancer ([place])   . Cancer of right breast ([place]) 09/28/2011   IDC;  T2, N0 (IHC+);   ER+;  Her-2 neg.,  Right PM,SLN  09/08/08   . Collagenous colitis   . Diverticulosis of colon   . External hemorrhoid   . Hearing loss   . Hypertension   . IBS (irritable bowel syndrome)   . Macular degeneration of both eyes   . Neuropathy, peripheral   . Nonspecific abnormal electrocardiogram (ECG) (EKG)   . Osteoporosis   . Other and unspecified hyperlipidemia   . Palpitations   . Phlebitis and thrombophlebitis of unspecified site   . Prolapse of vaginal walls without mention of uterine prolapse   . Shingles   . Symptomatic menopausal or female climacteric states   . Unspecified hypothyroidism     Past Surgical History:  Procedure Laterality Date  . BACK SURGERY    . BREAST LUMPECTOMY     per medical history form dated 09/27/09.  [name] Kitchen CARPAL TUNNEL RELEASE     bilateral  . COLONOSCOPY  12/03/2002   Dr. Silvano Rusk  . NEUROPLASTY / TRANSPOSITION MEDIAN NERVE AT CARPAL TUNNEL BILATERAL    . TONSILLECTOMY AND ADENOIDECTOMY    . VAGINAL HYSTERECTOMY      There were no vitals filed for this visit.      Subjective  Assessment - 01/15/17 1432    Subjective Reports that she is always sore after any new exercises.   Pertinent History arthritis, osteoporosis, hx breast cancer, HLD, HTN, macular degeneration, neuropathy   Limitations Walking   Patient Stated Goals improve joint pain (MD feels this is related to arthritis)   Currently in Pain? Yes   Pain Score --  No pain score provided   Pain Location Generalized   Pain Descriptors / Indicators Sore   Pain Type Chronic pain   Pain Onset More than a month ago            [facility] PT Assessment - 01/15/17 0001      Assessment   Medical Diagnosis weakness, hx of falls   Next MD Visit 03/19/17   Prior Therapy several times - unrelated to fall     Precautions   Precautions Fall     Restrictions   Weight Bearing Restrictions No                     [facility] Adult PT Treatment/Exercise - 01/15/17 0001      Knee/Hip Exercises: Aerobic   Nustep L5 x15 min UE/LE activity, monitored for progression  and focus on posture     Knee/Hip Exercises: Standing  Forward Step Up Both;2 sets;10 reps;Hand Hold: 2;Step Height: 6"   Other Standing Knee Exercises Sidestepping green theraband x6 RT in // bars     Knee/Hip Exercises: Seated   Long Arc Quad Strengthening;Both;3 sets;10 reps;Weights   Long Arc Quad Limitations 4   Other Seated Knee/Hip Exercises Seated X to V x15 reps, scap retract yellow theraband x20 reps, horizontal abduction with yellow theraband x20 reps with roll behind back for postural strengthening   Marching Strengthening;Both;3 sets;10 reps;Weights   Marching Weights 4 lbs.   Sit to Sand 15 reps;with UE support;without UE support  able to complete 3 without UE support with multiple attempts                  PT Short Term Goals - 01/15/17 1521      PT SHORT TERM GOAL #1   Title verbalize understanding of fall prevention strategies (01/02/17)   Time 4   Period Weeks   Status Achieved     PT SHORT TERM GOAL #2    Title improve gait velocity to > 1.8 ft/sec for improved function and decreased fall risk (01/02/17)   Time 4   Period Weeks   Status On-going     PT SHORT TERM GOAL #3   Title perform at least 3 reps of sit to/from stand without UE support from chair height for improved functional strength (01/02/17)   Time 4   Period Weeks   Status Achieved     PT SHORT TERM GOAL #4   Title perform BERG and write appropriate LTG (01/02/17)   Time 4   Period Weeks   Status On-going           PT Long Term Goals - 01/04/17 1346      PT LONG TERM GOAL #1   Title independent with HEP (12/30/16)   Time 8   Period Weeks   Status On-going     PT LONG TERM GOAL #2   Title improve gait velocity to > 2.1 ft/sec for improved function and community access (12/30/16)   Time 8   Period Weeks   Status On-going     PT LONG TERM GOAL #3   Title perform 5x STS without UE support in < 20 sec for improved functional strength (12/30/16)   Time 8   Period Weeks   Status On-going     PT LONG TERM GOAL #4   Title amb > 250' with RW modified independent on paved outdoor/indoor surfaces for improved community access (12/30/16)   Time 8   Period Weeks   Status On-going     PT LONG TERM GOAL #5   Title BERG LTG to follow (12/30/16)   Status On-going               Plan - 01/15/17 1519    Clinical Impression Statement Patient tolerated today's treatment fairly well although she reported soreness that she was unable to score for PTA. Postural strengthening continued in sitting to assist with correcting COG for balance without complaint from patient. No complaints from patient with standing exercises including step ups. Patient reported increased fatigue with LLE exercises in sititng today and overall fatigue by end of treatment.   Rehab Potential Fair   Clinical Impairments Affecting Rehab Potential severity of deficits   PT Frequency 2x / week   PT Duration 8 weeks   PT Treatment/Interventions ADLs/Self  Care Home Management;Cryotherapy;Moist Heat;Neuromuscular re-education;Balance training;Therapeutic exercise;Therapeutic activities;Functional mobility training;Stair training;Gait training;Patient/family education;Manual techniques;Vestibular;Taping;Electrical  Stimulation;Ultrasound   PT Next Visit Plan Continue with LE strengthening and general conditioning with patient hopes of ambulating with cane per MPT POC.   PT Home Exercise Plan HEP- knee flex and ext with red theraband, seated clam, standing hip abduction, ankle pumps   Consulted and Agree with Plan of Care Patient      Patient will benefit from skilled therapeutic intervention in order to improve the following deficits and impairments:  Abnormal gait, Decreased balance, Decreased strength, Pain, Difficulty walking, Decreased mobility, Postural dysfunction  Visit Diagnosis: Unsteadiness on feet  Other abnormalities of gait and mobility  Muscle weakness (generalized)     Problem List Patient Active Problem List   Diagnosis Date Noted  . Benign essential HTN 05/22/2014  . Age-related macular degeneration, wet, both eyes ([place]) 03/12/2014  . Internal hemorrhoids without mention of complication 11/94/1740  . Immune fecal occult blood test + x 2 01/05/2012  . Cancer of right breast ([place]) 09/28/2011  . Hearing loss 12/27/2010  . Arthritis 12/27/2010  . Hyperlipidemia   . Phlebitis and thrombophlebitis of unspecified site   . Symptomatic menopausal or female climacteric states   . Malaise and fatigue   . Palpitations   . Nonspecific abnormal electrocardiogram (ECG) (EKG)   . Hypothyroidism   . Osteoporosis   . IBS (irritable bowel syndrome)     Wynelle Fanny, PTA 01/15/2017, 3:22 PM  [facility] [address] [place], [place], [address] Phone: [phone]   Fax:  [phone]  Name: Janice Reed MRN: [id number] Date of Birth: 1922/08/06

## 2017-01-16 DIAGNOSIS — H353211 Exudative age-related macular degeneration, right eye, with active choroidal neovascularization: Secondary | ICD-10-CM | POA: Diagnosis not present

## 2017-01-16 DIAGNOSIS — H353221 Exudative age-related macular degeneration, left eye, with active choroidal neovascularization: Secondary | ICD-10-CM | POA: Diagnosis not present

## 2017-01-18 ENCOUNTER — Ambulatory Visit: Payer: Medicare Other | Admitting: Physical Therapy

## 2017-01-18 ENCOUNTER — Encounter: Payer: Self-pay | Admitting: Physical Therapy

## 2017-01-18 DIAGNOSIS — M6281 Muscle weakness (generalized): Secondary | ICD-10-CM | POA: Diagnosis not present

## 2017-01-18 DIAGNOSIS — R2681 Unsteadiness on feet: Secondary | ICD-10-CM

## 2017-01-18 DIAGNOSIS — R2689 Other abnormalities of gait and mobility: Secondary | ICD-10-CM

## 2017-01-18 NOTE — Therapy (Signed)
[facility] [place], [place], [address] Phone: [phone]   Fax:  [phone]  Physical Therapy Treatment  Patient Details  Name: Janice Reed MRN: [id number] Date of Birth: 06/22/1922 Referring Provider: Redge Gainer, MD  Encounter Date: 01/18/2017      PT End of Session - 01/18/17 1452    Visit Number 13   Number of Visits 16   Date for PT Re-Evaluation 01/30/17   Authorization Type UHC Medicare   PT Start Time 1430   PT Stop Time 1512   PT Time Calculation (min) 42 min   Activity Tolerance Patient tolerated treatment well   Behavior During Therapy [facility] for tasks assessed/performed      Past Medical History:  Diagnosis Date  . Arthritis   . Arthritis pain   . Benign hypertensive heart disease   . Breast cancer ([place])   . Cancer of right breast ([place]) 09/28/2011   IDC;  T2, N0 (IHC+);   ER+;  Her-2 neg.,  Right PM,SLN  09/08/08   . Collagenous colitis   . Diverticulosis of colon   . External hemorrhoid   . Hearing loss   . Hypertension   . IBS (irritable bowel syndrome)   . Macular degeneration of both eyes   . Neuropathy, peripheral   . Nonspecific abnormal electrocardiogram (ECG) (EKG)   . Osteoporosis   . Other and unspecified hyperlipidemia   . Palpitations   . Phlebitis and thrombophlebitis of unspecified site   . Prolapse of vaginal walls without mention of uterine prolapse   . Shingles   . Symptomatic menopausal or female climacteric states   . Unspecified hypothyroidism     Past Surgical History:  Procedure Laterality Date  . BACK SURGERY    . BREAST LUMPECTOMY     per medical history form dated 09/27/09.  [name] Kitchen CARPAL TUNNEL RELEASE     bilateral  . COLONOSCOPY  12/03/2002   Dr. Silvano Rusk  . NEUROPLASTY / TRANSPOSITION MEDIAN NERVE AT CARPAL TUNNEL BILATERAL    . TONSILLECTOMY AND ADENOIDECTOMY    . VAGINAL HYSTERECTOMY      There were no vitals filed for this visit.      Subjective  Assessment - 01/18/17 1434    Subjective Patient reported doing good after last treatment   Pertinent History arthritis, osteoporosis, hx breast cancer, HLD, HTN, macular degeneration, neuropathy   Limitations Walking   Patient Stated Goals improve joint pain (MD feels this is related to arthritis)   Currently in Pain? No/denies                         [facility] Adult PT Treatment/Exercise - 01/18/17 0001      Lumbar Exercises: Seated   Other Seated Lumbar Exercises seated pully's with towel behing back for posture focus x1mn     Knee/Hip Exercises: Aerobic   Nustep L5 x15 min UE/LE activity, monitored for progression  and focus on posture     Knee/Hip Exercises: Standing   Heel Raises Both;2 sets;10 reps   Forward Step Up Both;2 sets;10 reps;Hand Hold: 2;Step Height: 6"     Knee/Hip Exercises: Seated   Long Arc Quad Strengthening;Both;3 sets;10 reps;Weights   Long Arc Quad Limitations 4   Other Seated Knee/Hip Exercises Seated X to V x15 reps, scap retract yellow theraband x20 reps, horizontal abduction with yellow theraband x20 reps with roll behind back for postural strengthening   Marching Strengthening;Both;3 sets;10 reps;Weights  Marching Weights 4 lbs.   Sit to Sand 10 reps;with UE support     Knee/Hip Exercises: Supine   Other Supine Knee/Hip Exercises seated for 1# D1 bil UE 2x10                  PT Short Term Goals - 01/15/17 1521      PT SHORT TERM GOAL #1   Title verbalize understanding of fall prevention strategies (01/02/17)   Time 4   Period Weeks   Status Achieved     PT SHORT TERM GOAL #2   Title improve gait velocity to > 1.8 ft/sec for improved function and decreased fall risk (01/02/17)   Time 4   Period Weeks   Status On-going     PT SHORT TERM GOAL #3   Title perform at least 3 reps of sit to/from stand without UE support from chair height for improved functional strength (01/02/17)   Time 4   Period Weeks   Status  Achieved     PT SHORT TERM GOAL #4   Title perform BERG and write appropriate LTG (01/02/17)   Time 4   Period Weeks   Status On-going           PT Long Term Goals - 01/04/17 1346      PT LONG TERM GOAL #1   Title independent with HEP (12/30/16)   Time 8   Period Weeks   Status On-going     PT LONG TERM GOAL #2   Title improve gait velocity to > 2.1 ft/sec for improved function and community access (12/30/16)   Time 8   Period Weeks   Status On-going     PT LONG TERM GOAL #3   Title perform 5x STS without UE support in < 20 sec for improved functional strength (12/30/16)   Time 8   Period Weeks   Status On-going     PT LONG TERM GOAL #4   Title amb > 250' with RW modified independent on paved outdoor/indoor surfaces for improved community access (12/30/16)   Time 8   Period Weeks   Status On-going     PT LONG TERM GOAL #5   Title BERG LTG to follow (12/30/16)   Status On-going               Plan - 01/18/17 1456    Clinical Impression Statement Patient tolerated treatment well today. Patient has reported no falls. Patient is able to perform most ADL's with greater ease yet some limitations due to fatigue. Patient reported difficulty with activity tolerace yet improvement overall. Patient goals ongoing due to strength and balance deficts.    Rehab Potential Fair   Clinical Impairments Affecting Rehab Potential severity of deficits   PT Frequency 2x / week   PT Duration 8 weeks   PT Treatment/Interventions ADLs/Self Care Home Management;Cryotherapy;Moist Heat;Neuromuscular re-education;Balance training;Therapeutic exercise;Therapeutic activities;Functional mobility training;Stair training;Gait training;Patient/family education;Manual techniques;Vestibular;Taping;Electrical Stimulation;Ultrasound   PT Next Visit Plan Continue with LE strengthening and general conditioning with patient hopes of ambulating with cane per MPT POC.   Consulted and Agree with Plan of Care  Patient      Patient will benefit from skilled therapeutic intervention in order to improve the following deficits and impairments:  Abnormal gait, Decreased balance, Decreased strength, Pain, Difficulty walking, Decreased mobility, Postural dysfunction  Visit Diagnosis: Unsteadiness on feet  Other abnormalities of gait and mobility  Muscle weakness (generalized)     Problem List Patient Active Problem  List   Diagnosis Date Noted  . Benign essential HTN 05/22/2014  . Age-related macular degeneration, wet, both eyes ([place]) 03/12/2014  . Internal hemorrhoids without mention of complication 71/25/2712  . Immune fecal occult blood test + x 2 01/05/2012  . Cancer of right breast ([place]) 09/28/2011  . Hearing loss 12/27/2010  . Arthritis 12/27/2010  . Hyperlipidemia   . Phlebitis and thrombophlebitis of unspecified site   . Symptomatic menopausal or female climacteric states   . Malaise and fatigue   . Palpitations   . Nonspecific abnormal electrocardiogram (ECG) (EKG)   . Hypothyroidism   . Osteoporosis   . IBS (irritable bowel syndrome)     DUNFORD, CHRISTINA P, PTA 01/18/2017, 3:12 PM  [facility] [place], [place], [address] Phone: [phone]   Fax:  [phone]  Name: Janice Reed MRN: [id number] Date of Birth: 1921-12-26

## 2017-01-22 ENCOUNTER — Ambulatory Visit: Payer: Medicare Other | Admitting: *Deleted

## 2017-01-22 DIAGNOSIS — M6281 Muscle weakness (generalized): Secondary | ICD-10-CM | POA: Diagnosis not present

## 2017-01-22 DIAGNOSIS — R2681 Unsteadiness on feet: Secondary | ICD-10-CM

## 2017-01-22 DIAGNOSIS — R2689 Other abnormalities of gait and mobility: Secondary | ICD-10-CM | POA: Diagnosis not present

## 2017-01-22 NOTE — Therapy (Signed)
[facility] [place], [place], [address] Phone: [phone]   Fax:  [phone]  Physical Therapy Treatment  Patient Details  Name: Janice Reed MRN: [id number] Date of Birth: Jan 20, 1922 Referring Provider: Redge Gainer, MD  Encounter Date: 01/22/2017      PT End of Session - 01/22/17 1447    Visit Number 14   Number of Visits 16   Date for PT Re-Evaluation 01/30/17   Authorization Type UHC Medicare   PT Start Time 1430   PT Stop Time 1520   PT Time Calculation (min) 50 min      Past Medical History:  Diagnosis Date  . Arthritis   . Arthritis pain   . Benign hypertensive heart disease   . Breast cancer ([place])   . Cancer of right breast ([place]) 09/28/2011   IDC;  T2, N0 (IHC+);   ER+;  Her-2 neg.,  Right PM,SLN  09/08/08   . Collagenous colitis   . Diverticulosis of colon   . External hemorrhoid   . Hearing loss   . Hypertension   . IBS (irritable bowel syndrome)   . Macular degeneration of both eyes   . Neuropathy, peripheral   . Nonspecific abnormal electrocardiogram (ECG) (EKG)   . Osteoporosis   . Other and unspecified hyperlipidemia   . Palpitations   . Phlebitis and thrombophlebitis of unspecified site   . Prolapse of vaginal walls without mention of uterine prolapse   . Shingles   . Symptomatic menopausal or female climacteric states   . Unspecified hypothyroidism     Past Surgical History:  Procedure Laterality Date  . BACK SURGERY    . BREAST LUMPECTOMY     per medical history form dated 09/27/09.  [name] Kitchen CARPAL TUNNEL RELEASE     bilateral  . COLONOSCOPY  12/03/2002   Dr. Silvano Rusk  . NEUROPLASTY / TRANSPOSITION MEDIAN NERVE AT CARPAL TUNNEL BILATERAL    . TONSILLECTOMY AND ADENOIDECTOMY    . VAGINAL HYSTERECTOMY      There were no vitals filed for this visit.      Subjective Assessment - 01/22/17 1447    Subjective Patient reported doing good after last treatment   Pertinent History  arthritis, osteoporosis, hx breast cancer, HLD, HTN, macular degeneration, neuropathy   Limitations Walking   Patient Stated Goals improve joint pain (MD feels this is related to arthritis)   Currently in Pain? No/denies                         [facility] Adult PT Treatment/Exercise - 01/22/17 0001      Knee/Hip Exercises: Aerobic   Nustep L5 x15 min UE/LE activity, monitored for progression  and focus on posture     Knee/Hip Exercises: Standing   Heel Raises Both;2 sets;10 reps   Forward Step Up --   Rocker Board 5 minutes  PF/DF and balance   Other Standing Knee Exercises Sidestepping  LT and RT in // bars   Other Standing Knee Exercises  standing at wall with 55 cm  ball at back and working on posture with scapular retractions to help with balance control x 5 mins      Knee/Hip Exercises: Seated   Long Arc Quad Strengthening;Both;3 sets;10 reps;Weights   Long Arc Quad Limitations 4             Balance Exercises - 01/22/17 1500      Balance Exercises: Standing   Standing  Eyes Opened Foam/compliant surface  sidestep with close UE support on // bars x2 min   Tandem Stance Eyes open;Foam/compliant surface;Intermittent upper extremity support  x1 rep of 2 min each   Standing, One Foot on a Step Eyes open;6 inch;1 rep  x2 min each             PT Short Term Goals - 01/15/17 1521      PT SHORT TERM GOAL #1   Title verbalize understanding of fall prevention strategies (01/02/17)   Time 4   Period Weeks   Status Achieved     PT SHORT TERM GOAL #2   Title improve gait velocity to > 1.8 ft/sec for improved function and decreased fall risk (01/02/17)   Time 4   Period Weeks   Status On-going     PT SHORT TERM GOAL #3   Title perform at least 3 reps of sit to/from stand without UE support from chair height for improved functional strength (01/02/17)   Time 4   Period Weeks   Status Achieved     PT SHORT TERM GOAL #4   Title perform BERG and write  appropriate LTG (01/02/17)   Time 4   Period Weeks   Status On-going           PT Long Term Goals - 01/04/17 1346      PT LONG TERM GOAL #1   Title independent with HEP (12/30/16)   Time 8   Period Weeks   Status On-going     PT LONG TERM GOAL #2   Title improve gait velocity to > 2.1 ft/sec for improved function and community access (12/30/16)   Time 8   Period Weeks   Status On-going     PT LONG TERM GOAL #3   Title perform 5x STS without UE support in < 20 sec for improved functional strength (12/30/16)   Time 8   Period Weeks   Status On-going     PT LONG TERM GOAL #4   Title amb > 250' with RW modified independent on paved outdoor/indoor surfaces for improved community access (12/30/16)   Time 8   Period Weeks   Status On-going     PT LONG TERM GOAL #5   Title BERG LTG to follow (12/30/16)   Status On-going               Plan - 01/22/17 1448    Rehab Potential Fair   Clinical Impairments Affecting Rehab Potential severity of deficits   PT Frequency 2x / week   PT Duration 8 weeks   PT Treatment/Interventions ADLs/Self Care Home Management;Cryotherapy;Moist Heat;Neuromuscular re-education;Balance training;Therapeutic exercise;Therapeutic activities;Functional mobility training;Stair training;Gait training;Patient/family education;Manual techniques;Vestibular;Taping;Electrical Stimulation;Ultrasound   PT Home Exercise Plan HEP- knee flex and ext with red theraband, seated clam, standing hip abduction, ankle pumps   Consulted and Agree with Plan of Care Patient      Patient will benefit from skilled therapeutic intervention in order to improve the following deficits and impairments:     Visit Diagnosis: Unsteadiness on feet  Other abnormalities of gait and mobility  Muscle weakness (generalized)     Problem List Patient Active Problem List   Diagnosis Date Noted  . Benign essential HTN 05/22/2014  . Age-related macular degeneration, wet, both  eyes ([place]) 03/12/2014  . Internal hemorrhoids without mention of complication 94/76/5465  . Immune fecal occult blood test + x 2 01/05/2012  . Cancer of right breast ([place]) 09/28/2011  . Hearing  loss 12/27/2010  . Arthritis 12/27/2010  . Hyperlipidemia   . Phlebitis and thrombophlebitis of unspecified site   . Symptomatic menopausal or female climacteric states   . Malaise and fatigue   . Palpitations   . Nonspecific abnormal electrocardiogram (ECG) (EKG)   . Hypothyroidism   . Osteoporosis   . IBS (irritable bowel syndrome)     Brileigh Sevcik,CHRIS, PTA 01/22/2017, 3:27 PM  [facility] [place], [place], [address] Phone: [phone]   Fax:  [phone]  Name: Janice Reed MRN: [id number] Date of Birth: 02-03-1922

## 2017-01-25 ENCOUNTER — Ambulatory Visit: Payer: Medicare Other | Admitting: *Deleted

## 2017-01-25 DIAGNOSIS — R2689 Other abnormalities of gait and mobility: Secondary | ICD-10-CM

## 2017-01-25 DIAGNOSIS — R2681 Unsteadiness on feet: Secondary | ICD-10-CM

## 2017-01-25 DIAGNOSIS — M6281 Muscle weakness (generalized): Secondary | ICD-10-CM | POA: Diagnosis not present

## 2017-01-25 NOTE — Therapy (Signed)
[facility] [place], [place], [address] Phone: [phone]   Fax:  [phone]  Physical Therapy Treatment  Patient Details  Name: Janice Reed MRN: [id number] Date of Birth: 05/21/1922 Referring Provider: Redge Gainer, MD  Encounter Date: 01/25/2017      PT End of Session - 01/25/17 1503    Visit Number 15   Number of Visits 16   Date for PT Re-Evaluation 01/30/17   Authorization Type U[employer] Medicare   PT Start Time 1430   PT Stop Time 1520   PT Time Calculation (min) 50 min      Past Medical History:  Diagnosis Date  . Arthritis   . Arthritis pain   . Benign hypertensive heart disease   . Breast cancer ([place])   . Cancer of right breast ([place]) 09/28/2011   IDC;  T2, N0 (IHC+);   ER+;  Her-2 neg.,  Right PM,SLN  09/08/08   . Collagenous colitis   . Diverticulosis of colon   . External hemorrhoid   . Hearing loss   . Hypertension   . IBS (irritable bowel syndrome)   . Macular degeneration of both eyes   . Neuropathy, peripheral   . Nonspecific abnormal electrocardiogram (ECG) (EKG)   . Osteoporosis   . Other and unspecified hyperlipidemia   . Palpitations   . Phlebitis and thrombophlebitis of unspecified site   . Prolapse of vaginal walls without mention of uterine prolapse   . Shingles   . Symptomatic menopausal or female climacteric states   . Unspecified hypothyroidism     Past Surgical History:  Procedure Laterality Date  . BACK SURGERY    . BREAST LUMPECTOMY     per medical history form dated 09/27/09.  [name] Kitchen CARPAL TUNNEL RELEASE     bilateral  . COLONOSCOPY  12/03/2002   Dr. Silvano Rusk  . NEUROPLASTY / TRANSPOSITION MEDIAN NERVE AT CARPAL TUNNEL BILATERAL    . TONSILLECTOMY AND ADENOIDECTOMY    . VAGINAL HYSTERECTOMY      There were no vitals filed for this visit.      Subjective Assessment - 01/25/17 1502    Subjective Patient reported doing good after last treatment. Doing good today   Pertinent History arthritis, osteoporosis, hx breast cancer, HLD, HTN, macular degeneration, neuropathy   Limitations Walking   Patient Stated Goals improve joint pain (MD feels this is related to arthritis)   Currently in Pain? No/denies                         [facility] Adult PT Treatment/Exercise - 01/25/17 0001      Knee/Hip Exercises: Aerobic   Nustep L5 x15 min UE/LE activity, monitored for progression  and focus on posture     Knee/Hip Exercises: Standing   Heel Raises Both;2 sets;10 reps   Rocker Board 5 minutes  PF/DF and balance   Other Standing Knee Exercises Sidestepping  LT and RT in // bars   Other Standing Knee Exercises  standing at wall with 55 cm  ball at back and working on posture with scapular retractions to help with balance control x 5 mins      Knee/Hip Exercises: Seated   Long Arc Quad Strengthening;Both;3 sets;10 reps;Weights   Long Arc Quad Limitations 4             Balance Exercises - 01/25/17 1511      Balance Exercises: Standing   Standing Eyes Opened Foam/compliant surface  Tandem Stance Eyes open;Foam/compliant surface;Intermittent upper extremity support   Standing, One Foot on a Step Eyes open;6 inch;1 rep             PT Short Term Goals - 01/15/17 1521      PT SHORT TERM GOAL #1   Title verbalize understanding of fall prevention strategies (01/02/17)   Time 4   Period Weeks   Status Achieved     PT SHORT TERM GOAL #2   Title improve gait velocity to > 1.8 ft/sec for improved function and decreased fall risk (01/02/17)   Time 4   Period Weeks   Status On-going     PT SHORT TERM GOAL #3   Title perform at least 3 reps of sit to/from stand without UE support from chair height for improved functional strength (01/02/17)   Time 4   Period Weeks   Status Achieved     PT SHORT TERM GOAL #4   Title perform BERG and write appropriate LTG (01/02/17)   Time 4   Period Weeks   Status On-going           PT Long  Term Goals - 01/04/17 1346      PT LONG TERM GOAL #1   Title independent with HEP (12/30/16)   Time 8   Period Weeks   Status On-going     PT LONG TERM GOAL #2   Title improve gait velocity to > 2.1 ft/sec for improved function and community access (12/30/16)   Time 8   Period Weeks   Status On-going     PT LONG TERM GOAL #3   Title perform 5x STS without UE support in < 20 sec for improved functional strength (12/30/16)   Time 8   Period Weeks   Status On-going     PT LONG TERM GOAL #4   Title amb > 250' with RW modified independent on paved outdoor/indoor surfaces for improved community access (12/30/16)   Time 8   Period Weeks   Status On-going     PT LONG TERM GOAL #5   Title BERG LTG to follow (12/30/16)   Status On-going               Plan - 01/25/17 1708    Clinical Impression Statement Pt arrived to clinic feeling fairly well ambulating with FWW and needing v/cs to stand erect inside of walker. She states she has been doing better with that at home. She did well with balance and strengthening exs today and has progressed overall. She has 1 visit left and will be DC next week   Clinical Impairments Affecting Rehab Potential severity of deficits   PT Frequency 2x / week   PT Duration 8 weeks   PT Treatment/Interventions ADLs/Self Care Home Management;Cryotherapy;Moist Heat;Neuromuscular re-education;Balance training;Therapeutic exercise;Therapeutic activities;Functional mobility training;Stair training;Gait training;Patient/family education;Manual techniques;Vestibular;Taping;Electrical Stimulation;Ultrasound   PT Next Visit Plan Continue with LE strengthening and general conditioning with patient hopes of ambulating with cane per MPT POC.   Consulted and Agree with Plan of Care Patient      Patient will benefit from skilled therapeutic intervention in order to improve the following deficits and impairments:  Abnormal gait, Decreased balance, Decreased strength,  Pain, Difficulty walking, Decreased mobility, Postural dysfunction  Visit Diagnosis: Unsteadiness on feet  Other abnormalities of gait and mobility  Muscle weakness (generalized)     Problem List Patient Active Problem List   Diagnosis Date Noted  . Benign essential HTN 05/22/2014  .  Age-related macular degeneration, wet, both eyes ([place]) 03/12/2014  . Internal hemorrhoids without mention of complication 19/59/7471  . Immune fecal occult blood test + x 2 01/05/2012  . Cancer of right breast ([place]) 09/28/2011  . Hearing loss 12/27/2010  . Arthritis 12/27/2010  . Hyperlipidemia   . Phlebitis and thrombophlebitis of unspecified site   . Symptomatic menopausal or female climacteric states   . Malaise and fatigue   . Palpitations   . Nonspecific abnormal electrocardiogram (ECG) (EKG)   . Hypothyroidism   . Osteoporosis   . IBS (irritable bowel syndrome)     Kaizen Ibsen,CHRIS, PTA 01/25/2017, 5:16 PM  [facility] [place], [place], [address] Phone: [phone]   Fax:  [phone]  Name: Janice Reed MRN: [id number] Date of Birth: [birth year]

## 2017-01-29 ENCOUNTER — Ambulatory Visit: Payer: Medicare Other | Admitting: Physical Therapy

## 2017-01-29 ENCOUNTER — Encounter: Payer: Self-pay | Admitting: Physical Therapy

## 2017-01-29 DIAGNOSIS — M6281 Muscle weakness (generalized): Secondary | ICD-10-CM

## 2017-01-29 DIAGNOSIS — R2689 Other abnormalities of gait and mobility: Secondary | ICD-10-CM | POA: Diagnosis not present

## 2017-01-29 DIAGNOSIS — R2681 Unsteadiness on feet: Secondary | ICD-10-CM

## 2017-01-29 NOTE — Therapy (Signed)
[facility] [place], [place], [address] Phone: [phone]   Fax:  [phone]  Physical Therapy Treatment  Patient Details  Name: Janice Reed MRN: [id number] Date of Birth: [birth year] Referring Provider: Redge Gainer, MD  Encounter Date: 01/29/2017      PT End of Session - 01/29/17 1423    Visit Number 16   Number of Visits 16   Date for PT Re-Evaluation 01/30/17   Authorization Type UHC Medicare   PT Start Time 1430   PT Stop Time 1512   PT Time Calculation (min) 42 min   Activity Tolerance Patient tolerated treatment well   Behavior During Therapy [facility] for tasks assessed/performed      Past Medical History:  Diagnosis Date  . Arthritis   . Arthritis pain   . Benign hypertensive heart disease   . Breast cancer ([place])   . Cancer of right breast ([place]) 09/28/2011   IDC;  T2, N0 (IHC+);   ER+;  Her-2 neg.,  Right PM,SLN  09/08/08   . Collagenous colitis   . Diverticulosis of colon   . External hemorrhoid   . Hearing loss   . Hypertension   . IBS (irritable bowel syndrome)   . Macular degeneration of both eyes   . Neuropathy, peripheral   . Nonspecific abnormal electrocardiogram (ECG) (EKG)   . Osteoporosis   . Other and unspecified hyperlipidemia   . Palpitations   . Phlebitis and thrombophlebitis of unspecified site   . Prolapse of vaginal walls without mention of uterine prolapse   . Shingles   . Symptomatic menopausal or female climacteric states   . Unspecified hypothyroidism     Past Surgical History:  Procedure Laterality Date  . BACK SURGERY    . BREAST LUMPECTOMY     per medical history form dated 09/27/09.  [name] Kitchen CARPAL TUNNEL RELEASE     bilateral  . COLONOSCOPY  12/03/2002   Dr. Silvano Rusk  . NEUROPLASTY / TRANSPOSITION MEDIAN NERVE AT CARPAL TUNNEL BILATERAL    . TONSILLECTOMY AND ADENOIDECTOMY    . VAGINAL HYSTERECTOMY      There were no vitals filed for this visit.      Subjective  Assessment - 01/29/17 1423    Subjective Reports that she knows her back and knee problem is not going to get any better and she knows it. Reports that she has been walking to her mailbox along her gravel driveway with a cane.   Pertinent History arthritis, osteoporosis, hx breast cancer, HLD, HTN, macular degeneration, neuropathy   Limitations Walking   Patient Stated Goals improve joint pain (MD feels this is related to arthritis)   Currently in Pain? No/denies            [facility] PT Assessment - 01/29/17 0001      Assessment   Medical Diagnosis weakness, hx of falls   Next MD Visit 03/19/17   Prior Therapy several times - unrelated to fall     Precautions   Precautions Fall     Restrictions   Weight Bearing Restrictions No                     [facility] Adult PT Treatment/Exercise - 01/29/17 0001      Ambulation/Gait   Ambulation/Gait Yes   Ambulation/Gait Assistance 6: Modified independent (Device/Increase time)   Ambulation Distance (Feet) 479 Feet  Two gait velocity tests for 20 ft then 40 ft, total 403 ft  Assistive device Rolling walker   Gait Pattern Step-through pattern;Decreased step length - right;Right flexed knee in stance;Lateral trunk lean to right;Trunk flexed;Narrow base of support   Ambulation Surface Level;Indoor;Unlevel;Outdoor;Paved   Door Management 6: Modified independent (Device/Increase time)   Ramp 6: Modified independent (Device)   Curb 6: Modified independent (Device/increase time)     Lumbar Exercises: Seated   Sit to Stand Other (comment)  Multiple attempts for goal assessment   Other Seated Lumbar Exercises Seated scap retract, posture with shoulder flexion and abduction x20 reps each     Knee/Hip Exercises: Aerobic   Nustep L5 x17 min UE/LE activity, monitored for progression  and focus on posture                  PT Short Term Goals - 01/29/17 1445      PT SHORT TERM GOAL #1   Title verbalize understanding of fall  prevention strategies (01/02/17)   Time 4   Period Weeks   Status Achieved     PT SHORT TERM GOAL #2   Title improve gait velocity to > 1.8 ft/sec for improved function and decreased fall risk (01/02/17)   Time 4   Period Weeks   Status Achieved  1.8 ft/sec for 20 ft achieved 01/29/2017     PT SHORT TERM GOAL #3   Title perform at least 3 reps of sit to/from stand without UE support from chair height for improved functional strength (01/02/17)   Time 4   Period Weeks   Status Achieved     PT SHORT TERM GOAL #4   Title perform BERG and write appropriate LTG (01/02/17)   Time 4   Period Weeks   Status Unable to assess           PT Long Term Goals - 01/29/17 1445      PT LONG TERM GOAL #1   Title independent with HEP (12/30/16)   Time 8   Period Weeks   Status Unable to assess     PT LONG TERM GOAL #2   Title improve gait velocity to > 2.1 ft/sec for improved function and community access (12/30/16)   Time 8   Period Weeks   Status Not Met  2.6 ft/sec for 40 ft 01/26/2017     PT LONG TERM GOAL #3   Title perform 5x STS without UE support in < 20 sec for improved functional strength (12/30/16)   Time 8   Period Weeks   Status Not Met  1 full rep with multiple attempts in 20 sec without UE support 01/29/2017     PT LONG TERM GOAL #4   Title amb > 250' with RW modified independent on paved outdoor/indoor surfaces for improved community access (12/30/16)   Time 8   Period Weeks   Status Achieved     PT LONG TERM GOAL #5   Title BERG LTG to follow (12/30/16)   Status Unable to assess               Plan - 01/29/17 1518    Clinical Impression Statement Patient presented in clinic with reports of noticing intermittant improvement although she knows that her back and R knee pain is chronic. Patient's gait speed has improved enough to meet one gait velocity goal but not both. Patient did well on community surfaces inside and outside of clinic with [place]. Patient still  requires VCs for erect posture during ambulation with reduced rib hump noted with erect posture.  Rehab Potential Fair   Clinical Impairments Affecting Rehab Potential severity of deficits   PT Frequency 2x / week   PT Duration 8 weeks   PT Treatment/Interventions ADLs/Self Care Home Management;Cryotherapy;Moist Heat;Neuromuscular re-education;Balance training;Therapeutic exercise;Therapeutic activities;Functional mobility training;Stair training;Gait training;Patient/family education;Manual techniques;Vestibular;Taping;Electrical Stimulation;Ultrasound   PT Next Visit Plan Continue with LE strengthening and general conditioning with patient hopes of ambulating with cane per MPT POC.   PT Home Exercise Plan HEP- knee flex and ext with red theraband, seated clam, standing hip abduction, ankle pumps   Consulted and Agree with Plan of Care Patient      Patient will benefit from skilled therapeutic intervention in order to improve the following deficits and impairments:  Abnormal gait, Decreased balance, Decreased strength, Pain, Difficulty walking, Decreased mobility, Postural dysfunction  Visit Diagnosis: Unsteadiness on feet  Other abnormalities of gait and mobility  Muscle weakness (generalized)     Problem List Patient Active Problem List   Diagnosis Date Noted  . Benign essential HTN 05/22/2014  . Age-related macular degeneration, wet, both eyes ([place]) 03/12/2014  . Internal hemorrhoids without mention of complication 37/05/6268  . Immune fecal occult blood test + x 2 01/05/2012  . Cancer of right breast ([place]) 09/28/2011  . Hearing loss 12/27/2010  . Arthritis 12/27/2010  . Hyperlipidemia   . Phlebitis and thrombophlebitis of unspecified site   . Symptomatic menopausal or female climacteric states   . Malaise and fatigue   . Palpitations   . Nonspecific abnormal electrocardiogram (ECG) (EKG)   . Hypothyroidism   . Osteoporosis   . IBS (irritable bowel syndrome)      Ahmed Prima, PTA 01/29/17 3:36 PM  [facility] [address] [place], [place], [address] Phone: [phone]   Fax:  [phone]  Name: Janice Reed MRN: [id number] Date of Birth: Jun 09, 1922   PHYSICAL THERAPY DISCHARGE SUMMARY  Visits from Start of Care: 16.  Current functional level related to goals / functional outcomes: See above.   Remaining deficits: See goal section above.   Education / Equipment:  Plan: Patient agrees to discharge.  Patient goals were partially met. Patient is being discharged due to being pleased with the current functional level.  ?????          .Mali Applegate MPT

## 2017-02-05 ENCOUNTER — Other Ambulatory Visit: Payer: Self-pay | Admitting: Family Medicine

## 2017-02-13 ENCOUNTER — Encounter: Payer: Self-pay | Admitting: *Deleted

## 2017-02-21 DIAGNOSIS — H353211 Exudative age-related macular degeneration, right eye, with active choroidal neovascularization: Secondary | ICD-10-CM | POA: Diagnosis not present

## 2017-02-21 DIAGNOSIS — D3132 Benign neoplasm of left choroid: Secondary | ICD-10-CM | POA: Diagnosis not present

## 2017-02-21 DIAGNOSIS — H43813 Vitreous degeneration, bilateral: Secondary | ICD-10-CM | POA: Diagnosis not present

## 2017-02-21 DIAGNOSIS — H353221 Exudative age-related macular degeneration, left eye, with active choroidal neovascularization: Secondary | ICD-10-CM | POA: Diagnosis not present

## 2017-03-19 ENCOUNTER — Ambulatory Visit: Payer: Medicare Other | Admitting: Family Medicine

## 2017-03-21 ENCOUNTER — Ambulatory Visit (INDEPENDENT_AMBULATORY_CARE_PROVIDER_SITE_OTHER): Payer: Medicare Other | Admitting: Family Medicine

## 2017-03-21 ENCOUNTER — Encounter: Payer: Self-pay | Admitting: Family Medicine

## 2017-03-21 VITALS — BP 137/72 | HR 93 | Temp 97.4°F | Ht 61.0 in | Wt 117.0 lb

## 2017-03-21 DIAGNOSIS — I10 Essential (primary) hypertension: Secondary | ICD-10-CM | POA: Diagnosis not present

## 2017-03-21 DIAGNOSIS — R2681 Unsteadiness on feet: Secondary | ICD-10-CM

## 2017-03-21 DIAGNOSIS — M25552 Pain in left hip: Secondary | ICD-10-CM | POA: Diagnosis not present

## 2017-03-21 DIAGNOSIS — G8929 Other chronic pain: Secondary | ICD-10-CM | POA: Diagnosis not present

## 2017-03-21 DIAGNOSIS — E7800 Pure hypercholesterolemia, unspecified: Secondary | ICD-10-CM

## 2017-03-21 DIAGNOSIS — E559 Vitamin D deficiency, unspecified: Secondary | ICD-10-CM

## 2017-03-21 DIAGNOSIS — M25561 Pain in right knee: Secondary | ICD-10-CM | POA: Diagnosis not present

## 2017-03-21 DIAGNOSIS — M25551 Pain in right hip: Secondary | ICD-10-CM

## 2017-03-21 DIAGNOSIS — M25562 Pain in left knee: Secondary | ICD-10-CM | POA: Diagnosis not present

## 2017-03-21 DIAGNOSIS — R600 Localized edema: Secondary | ICD-10-CM | POA: Diagnosis not present

## 2017-03-21 DIAGNOSIS — R531 Weakness: Secondary | ICD-10-CM | POA: Diagnosis not present

## 2017-03-21 MED ORDER — LEVOTHYROXINE SODIUM 100 MCG PO TABS: 100.0000 ug | ORAL_TABLET | Freq: Every day | ORAL | 1 refills | Status: DC

## 2017-03-21 MED ORDER — LEVOTHYROXINE SODIUM 25 MCG PO TABS: ORAL_TABLET | ORAL | 1 refills | Status: DC

## 2017-03-21 MED ORDER — TRIAMTERENE-HCTZ 37.5-25 MG PO TABS: 0.5000 | ORAL_TABLET | Freq: Every day | ORAL | 3 refills | Status: DC

## 2017-03-21 NOTE — Patient Instructions (Addendum)
Medicare Annual Wellness Visit  [facility] and the medical providers at [facility] strive to bring you the best medical care.  In doing so we not only want to address your current medical conditions and concerns but also to detect new conditions early and prevent illness, disease and health-related problems.    Medicare offers a yearly Wellness Visit which allows our clinical staff to assess your need for preventative services including immunizations, lifestyle education, counseling to decrease risk of preventable diseases and screening for fall risk and other medical concerns.    This visit is provided free of charge (no copay) for all Medicare recipients. The clinical pharmacists at [facility] have begun to conduct these Wellness Visits which will also include a thorough review of all your medications.    As you primary medical provider recommend that you make an appointment for your Annual Wellness Visit if you have not done so already this year.  You may set up this appointment before you leave today or you may call back ([phone]) and schedule an appointment.  Please make sure when you call that you mention that you are scheduling your Annual Wellness Visit with the clinical pharmacist so that the appointment may be made for the proper length of time.     Continue current medications. Continue good therapeutic lifestyle changes which include good diet and exercise. Fall precautions discussed with patient. If an FOBT was given today- please return it to our front desk. If you are over 24 years old - you may need Prevnar 103 or the adult Pneumonia vaccine.  **Flu shots are available--- please call and schedule a FLU-CLINIC appointment**  After your visit with [place] today you will receive a survey in the mail or online from [employer] regarding your care with [place]. Please take a moment to fill this out. Your feedback is very  important to [place] as you can help [place] better understand your patient needs as well as improve your experience and satisfaction. WE CARE ABOUT YOU!!!   A couple of times a day for about 20 minutes prior to the feet up higher than the level of the heart Over the next couple of weeks take 1 whole Maxide on Monday Wednesday and Friday and a half a one the other days. After couple weeks go back to a half a one daily. Be sure and watch the sodium intake more closely Consider trying a salon pas over the right hip area where you hurt the most. Do not use a heating pad If any he is needed only use warm wet compresses. Continue to take extra strength Tylenol if needed for pain Please call the nurse in a couple weeks regarding the edema in her legs and if elevation has helped and if the extra fluid pill has helped

## 2017-03-21 NOTE — Progress Notes (Signed)
Subjective:    Patient ID: Janice Reed, female    DOB: 01-14-1922, 83 y.o.   MRN: [id number]  HPI Pt here for follow up and management of chronic medical problems which includes hyperlipidemia and hypertension. She is taking medication regularly.The patient today complains of arthralgias and weakness since the fall she had one year ago. She also continues to have right hip pain at times. She is requesting refills on several of her medicines. She will be given an FOBT to return and will get lab work today. She did [place] physical [place] with rehabilitation and finish this 2 weeks ago. She went for 16 sessions. Her vital signs are stable but her weight is down about 3 pounds from the previous visit at 117. Her BMI is 22.1. The patient is doing well overall. She complains of some pain on the right iliac crest area. She says she doesn't understand the swelling she is having in her legs with the left being worse than the right it doesn't go down all the way at nighttime. She says she is not eating any extra salt. She denies any chest pain or shortness of breath. She denies any trouble with nausea vomiting diarrhea or blood in the stool and says her bowels are working normally. She does have urinary frequency but no burning. Her home blood pressure readings are running consistent with what we have in the office. She does take Maxide a half a tablet daily.     Patient Active Problem List   Diagnosis Date Noted  . Benign essential HTN 05/22/2014  . Age-related macular degeneration, wet, both eyes ([facility]) 03/12/2014  . Internal hemorrhoids without mention of complication 75/64/3329  . Immune fecal occult blood test + x 2 01/05/2012  . Cancer of right breast ([facility]) 09/28/2011  . Hearing loss 12/27/2010  . Arthritis 12/27/2010  . Hyperlipidemia   . Phlebitis and thrombophlebitis of unspecified site   . Symptomatic menopausal or female climacteric states   . Malaise and fatigue   . Palpitations     . Nonspecific abnormal electrocardiogram (ECG) (EKG)   . Hypothyroidism   . Osteoporosis   . IBS (irritable bowel syndrome)    Outpatient Encounter Prescriptions as of 03/21/2017  Medication Sig  . Aflibercept (EYLEA IO) Inject into the eye every 30 (thirty) days.   [name] Kitchen aspirin 325 MG tablet Take 325 mg by mouth See admin instructions. Take one tablet a day and more if needed for pain - up to 3 times daily  . bevacizumab (AVASTIN) 1.25 mg/0.1 mL SOLN 1.25 mg by Intravitreal route.   . Calcium-Magnesium-Vitamin D (CITRACAL CALCIUM+D PO) Take 1 capsule by mouth 2 (two) times daily.    . Cholecalciferol (VITAMIN D3) 1000 UNITS CAPS Take 1,000 Units by mouth 2 (two) times daily.   [name] Kitchen levothyroxine (SYNTHROID, LEVOTHROID) 100 MCG tablet Take 1 tablet (100 mcg total) by mouth daily.  [name] Kitchen levothyroxine (SYNTHROID, LEVOTHROID) 25 MCG tablet TAKE 1 TABLET (25 MCG TOTAL) BY MOUTH DAILY BEFORE BREAKFAST. AS DIRECTED (Patient taking differently: Take 12.5 mcg by mouth every Monday, Wednesday, and Friday. TAKE 1 TABLET (25 MCG TOTAL) BY MOUTH DAILY BEFORE BREAKFAST. AS DIRECTED)  . Multiple Vitamins-Minerals (ICAPS AREDS FORMULA PO) Take 1 capsule by mouth 2 (two) times daily.   . Omega-3 Fatty Acids (FISH OIL) 1000 MG CAPS Take 1,000 mg by mouth 2 (two) times daily.   . risedronate (ACTONEL) 35 MG tablet TAKE 1 TABLET BY MOUTH  EVERY 7 DAYS WITH  WATER ON  EMPTY STOMACH, NOTHING BY  MOUTH OR LIE DOWN FOR NEXT  30 MINUTES.  . Risedronate Sodium 35 MG TBEC Take 1 tablet (35 mg total) by mouth once a week.  . triamterene-hydrochlorothiazide (MAXZIDE-25) 37.5-25 MG tablet Take one-half tablet by  mouth daily  . [DISCONTINUED] donepezil (ARICEPT) 5 MG tablet Take 1 tablet (5 mg total) by mouth at bedtime. (Patient not taking: Reported on 12/05/2016)   No facility-administered encounter medications on file as of 03/21/2017.       Review of Systems  Constitutional: Negative.   HENT: Negative.   Eyes: Negative.    Respiratory: Negative.   Cardiovascular: Negative.   Gastrointestinal: Negative.   Endocrine: Negative.   Genitourinary: Negative.   Musculoskeletal: Positive for arthralgias (and leg weakness since fall a year ago).       Right hip pain at times  Skin: Negative.   Allergic/Immunologic: Negative.   Neurological: Negative.   Hematological: Negative.   Psychiatric/Behavioral: Negative.        Objective:   Physical Exam  Constitutional: She is oriented to person, place, and time. She appears well-developed and well-nourished. No distress.  The patient is elderly but pleasant and alert and kyphotic.  HENT:  Head: Normocephalic and atraumatic.  Right Ear: External ear normal.  Left Ear: External ear normal.  Nose: Nose normal.  Mouth/Throat: Oropharynx is clear and moist. No oropharyngeal exudate.  Eyes: Pupils are equal, round, and reactive to light. Conjunctivae and EOM are normal. Right eye exhibits no discharge. Left eye exhibits no discharge. No scleral icterus.  Neck: Normal range of motion. Neck supple. No thyromegaly present.  No bruits or thyromegaly  Cardiovascular: Normal rate, regular rhythm, normal heart sounds and intact distal pulses.   No murmur heard. Heart has a regular rate and rhythm at 84/m  Pulmonary/Chest: Effort normal and breath sounds normal. No respiratory distress. She has no wheezes. She has no rales.  Clear anteriorly and posteriorly  Abdominal: Soft. Bowel sounds are normal. She exhibits no mass. There is no tenderness. There is no rebound and no guarding.  No liver or spleen enlargement no masses and no bruits  Musculoskeletal: She exhibits edema and deformity. She exhibits no tenderness.  The patient has a somewhat unstable gait and uses assistance for walking. She also has a kyphotic posture.  Lymphadenopathy:    She has no cervical adenopathy.  Neurological: She is alert and oriented to person, place, and time. She has normal reflexes. No cranial  nerve deficit.  Skin: Skin is warm and dry. No rash noted.  Psychiatric: She has a normal mood and affect. Her behavior is normal. Judgment and thought content normal.  Nursing note and vitals reviewed.   BP 137/72 (BP Location: Left Arm)   Pulse 93   Temp (!) 97.4 F (36.3 C) (Oral)   Ht _0  (1.549 m)   Wt 117 lb (53.1 kg)   BMI 22.11 kg/m       Assessment & Plan:  1. Benign essential HTN -The blood pressure is good today. The patient left her readings at home and said there were similar to what we have in the office today. She will continue to monitor blood pressure and watch her sodium intake. - BMP8+EGFR - CBC with Differential/Platelet - Hepatic function panel  2. Pure hypercholesterolemia -Continue omega-3 fatty acids and following a healthy diet - CBC with Differential/Platelet - Lipid panel  3. Gait instability -Use walker regularly and don't do any climbing -  CBC with Differential/Platelet  4. Vitamin D deficiency -Continue with vitamin D replacement pending results of lab work - CBC with Differential/Platelet - VITAMIN D 25 Hydroxy (Vit-D Deficiency, Fractures)  5. Weakness -Check blood work  6. Chronic arthralgias of knees and hips -Take extra strength Tylenol as needed for aches and pains. Avoid NSAIDs  7. Pedal edema -Continue to wear support hose -Watch sodium intake -Elevate legs 20 minutes twice daily higher than the level of the heart -Take 1 hold Maxide by mouth on Monday Wednesday and Friday and a half a one on all the other days for the next couple of weeks.  Meds ordered this encounter  Medications  . triamterene-hydrochlorothiazide (MAXZIDE-25) 37.5-25 MG tablet    Sig: Take 0.5 tablets by mouth daily.    Dispense:  45 tablet    Refill:  3  . levothyroxine (SYNTHROID, LEVOTHROID) 25 MCG tablet    Sig: TAKE 1 TABLET (25 MCG TOTAL) BY MOUTH DAILY BEFORE BREAKFAST. AS DIRECTED    Dispense:  90 tablet    Refill:  1  . levothyroxine  (SYNTHROID, LEVOTHROID) 100 MCG tablet    Sig: Take 1 tablet (100 mcg total) by mouth daily.    Dispense:  90 tablet    Refill:  1   Patient Instructions                       Medicare Annual Wellness Visit  [facility] and the medical providers at [facility] strive to bring you the best medical care.  In doing so we not only want to address your current medical conditions and concerns but also to detect new conditions early and prevent illness, disease and health-related problems.    Medicare offers a yearly Wellness Visit which allows our clinical staff to assess your need for preventative services including immunizations, lifestyle education, counseling to decrease risk of preventable diseases and screening for fall risk and other medical concerns.    This visit is provided free of charge (no copay) for all Medicare recipients. The clinical pharmacists at [facility] have begun to conduct these Wellness Visits which will also include a thorough review of all your medications.    As you primary medical provider recommend that you make an appointment for your Annual Wellness Visit if you have not done so already this year.  You may set up this appointment before you leave today or you may call back ([phone]) and schedule an appointment.  Please make sure when you call that you mention that you are scheduling your Annual Wellness Visit with the clinical pharmacist so that the appointment may be made for the proper length of time.     Continue current medications. Continue good therapeutic lifestyle changes which include good diet and exercise. Fall precautions discussed with patient. If an FOBT was given today- please return it to our front desk. If you are over 20 years old - you may need Prevnar 61 or the adult Pneumonia vaccine.  **Flu shots are available--- please call and schedule a FLU-CLINIC appointment**  After your visit with [place] today  you will receive a survey in the mail or online from [employer] regarding your care with [place]. Please take a moment to fill this out. Your feedback is very important to [place] as you can help [place] better understand your patient needs as well as improve your experience and satisfaction. WE CARE ABOUT YOU!!!   A couple of times  a day for about 20 minutes prior to the feet up higher than the level of the heart Over the next couple of weeks take 1 whole Maxide on Monday Wednesday and Friday and a half a one the other days. After couple weeks go back to a half a one daily. Be sure and watch the sodium intake more closely Consider trying a salon pas over the right hip area where you hurt the most. Do not use a heating pad If any he is needed only use warm wet compresses. Continue to take extra strength Tylenol if needed for pain Please call the nurse in a couple weeks regarding the edema in her legs and if elevation has helped and if the extra fluid pill has helped    Arrie Senate MD

## 2017-03-22 LAB — LIPID PANEL
Chol/HDL Ratio: 3 ratio (ref 0.0–4.4)
Cholesterol, Total: 237 mg/dL — ABNORMAL HIGH (ref 100–199)
HDL: 79 mg/dL (ref >39)
LDL Calculated: 141 mg/dL — ABNORMAL HIGH (ref 0–99)
Triglycerides: 87 mg/dL (ref 0–149)
VLDL Cholesterol Cal: 17 mg/dL (ref 5–40)

## 2017-03-22 LAB — BMP8+EGFR
BUN/Creatinine Ratio: 24 (ref 12–28)
BUN: 22 mg/dL (ref 10–36)
CALCIUM: 9.4 mg/dL (ref 8.7–10.3)
CO2: 25 mmol/L (ref 20–29)
CREATININE: 0.91 mg/dL (ref 0.57–1.00)
Chloride: 100 mmol/L (ref 96–106)
GFR calc Af Amer: 63 mL/min/{1.73_m2} (ref >59)
GFR, EST NON AFRICAN AMERICAN: 55 mL/min/{1.73_m2} — AB (ref >59)
GLUCOSE: 114 mg/dL — AB (ref 65–99)
Potassium: 4.3 mmol/L (ref 3.5–5.2)
SODIUM: 141 mmol/L (ref 134–144)

## 2017-03-22 LAB — CBC WITH DIFFERENTIAL/PLATELET
Basophils Absolute: 0.1 10*3/uL (ref 0.0–0.2)
Basos: 2 %
EOS (ABSOLUTE): 0.2 10*3/uL (ref 0.0–0.4)
Eos: 3 %
Hematocrit: 45.4 % (ref 34.0–46.6)
Hemoglobin: 14.6 g/dL (ref 11.1–15.9)
Immature Grans (Abs): 0 10*3/uL (ref 0.0–0.1)
Immature Granulocytes: 1 %
Lymphocytes Absolute: 1.8 10*3/uL (ref 0.7–3.1)
Lymphs: 30 %
MCH: 29.6 pg (ref 26.6–33.0)
MCHC: 32.2 g/dL (ref 31.5–35.7)
MCV: 92 fL (ref 79–97)
Monocytes Absolute: 0.6 10*3/uL (ref 0.1–0.9)
Monocytes: 10 %
Neutrophils Absolute: 3.4 10*3/uL (ref 1.4–7.0)
Neutrophils: 54 %
Platelets: 215 10*3/uL (ref 150–379)
RBC: 4.94 x10E6/uL (ref 3.77–5.28)
RDW: 14.7 % (ref 12.3–15.4)
WBC: 6.1 10*3/uL (ref 3.4–10.8)

## 2017-03-22 LAB — HEPATIC FUNCTION PANEL
ALT: 8 IU/L (ref 0–32)
AST: 19 IU/L (ref 0–40)
Albumin: 4.2 g/dL (ref 3.2–4.6)
Alkaline Phosphatase: 86 IU/L (ref 39–117)
Bilirubin Total: 0.3 mg/dL (ref 0.0–1.2)
Bilirubin, Direct: 0.11 mg/dL (ref 0.00–0.40)
Total Protein: 6.7 g/dL (ref 6.0–8.5)

## 2017-03-22 LAB — VITAMIN D 25 HYDROXY (VIT D DEFICIENCY, FRACTURES): Vit D, 25-Hydroxy: 50.1 ng/mL (ref 30.0–100.0)

## 2017-03-30 DIAGNOSIS — H903 Sensorineural hearing loss, bilateral: Secondary | ICD-10-CM | POA: Diagnosis not present

## 2017-04-02 ENCOUNTER — Encounter: Payer: Self-pay | Admitting: *Deleted

## 2017-04-04 ENCOUNTER — Telehealth: Payer: Self-pay | Admitting: *Deleted

## 2017-04-04 DIAGNOSIS — H353211 Exudative age-related macular degeneration, right eye, with active choroidal neovascularization: Secondary | ICD-10-CM | POA: Diagnosis not present

## 2017-04-04 DIAGNOSIS — H353221 Exudative age-related macular degeneration, left eye, with active choroidal neovascularization: Secondary | ICD-10-CM | POA: Diagnosis not present

## 2017-04-04 NOTE — Telephone Encounter (Signed)
Call patient's daughter and tell her to continue with the same including sodium restriction and support hose and elevation of legs when possible hopefully as the weather cools down the swelling will be less present.

## 2017-04-06 NOTE — Telephone Encounter (Signed)
Pt daughter aware

## 2017-05-22 DIAGNOSIS — H353221 Exudative age-related macular degeneration, left eye, with active choroidal neovascularization: Secondary | ICD-10-CM | POA: Diagnosis not present

## 2017-05-22 DIAGNOSIS — H353211 Exudative age-related macular degeneration, right eye, with active choroidal neovascularization: Secondary | ICD-10-CM | POA: Diagnosis not present

## 2017-05-29 ENCOUNTER — Ambulatory Visit (INDEPENDENT_AMBULATORY_CARE_PROVIDER_SITE_OTHER): Payer: Medicare Other

## 2017-05-29 DIAGNOSIS — Z23 Encounter for immunization: Secondary | ICD-10-CM | POA: Diagnosis not present

## 2017-06-23 IMAGING — DX DG RIBS W/ CHEST 3+V*L*
3 series · 3 of 3 positions shown · non-contrast
Comparison: Chest x-ray 02/03/2016

CLINICAL DATA: Fell yesterday.  Right hip pain and left rib pain.

EXAM:
LEFT RIBS AND CHEST - 3+ VIEW

[chest pa]
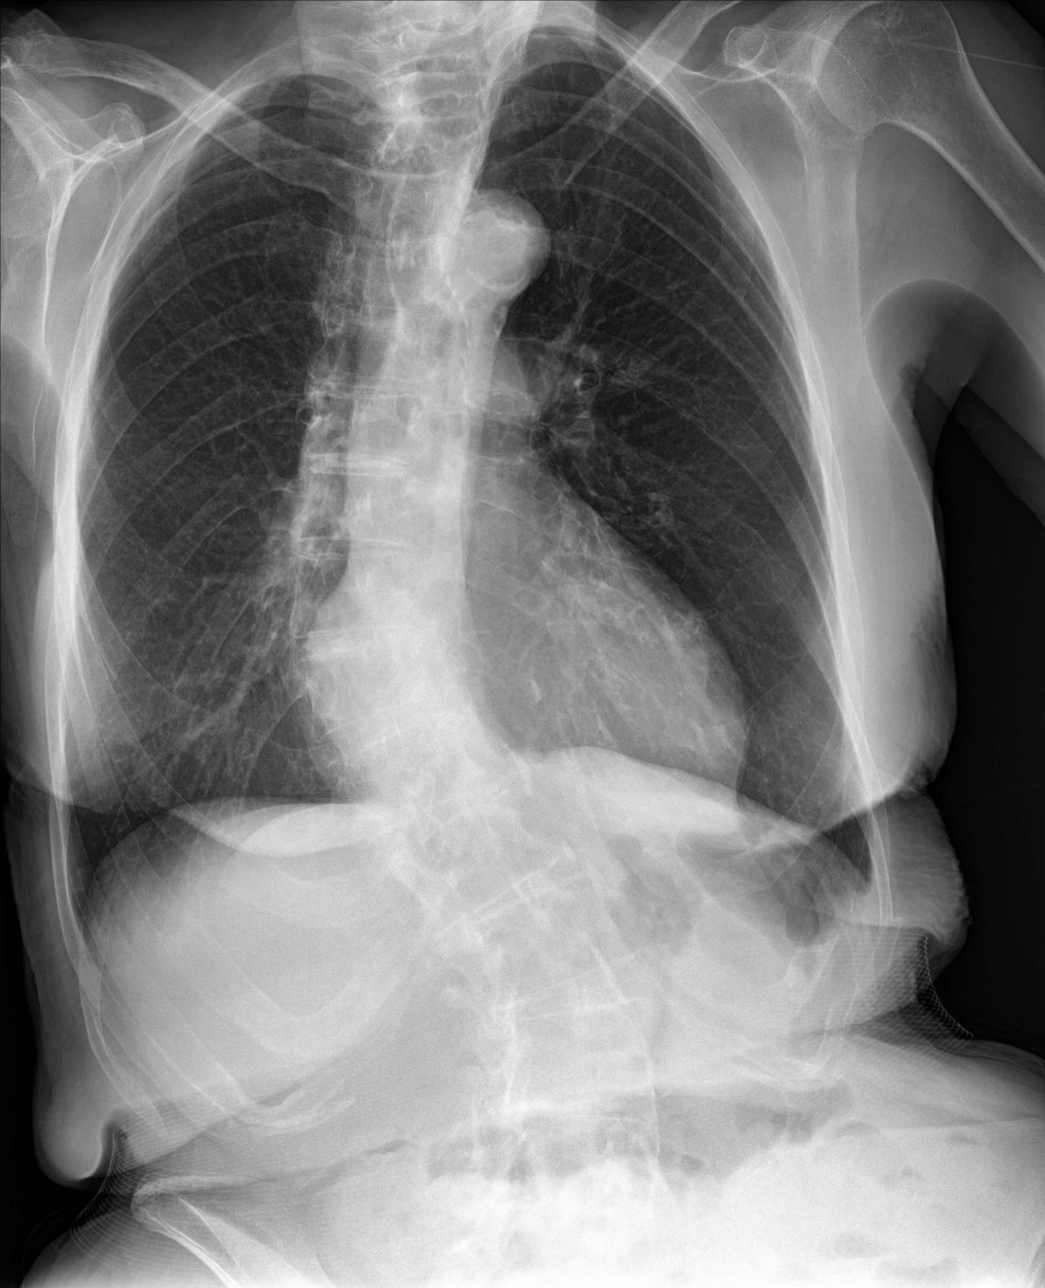

[rib obl (1 of 2)]
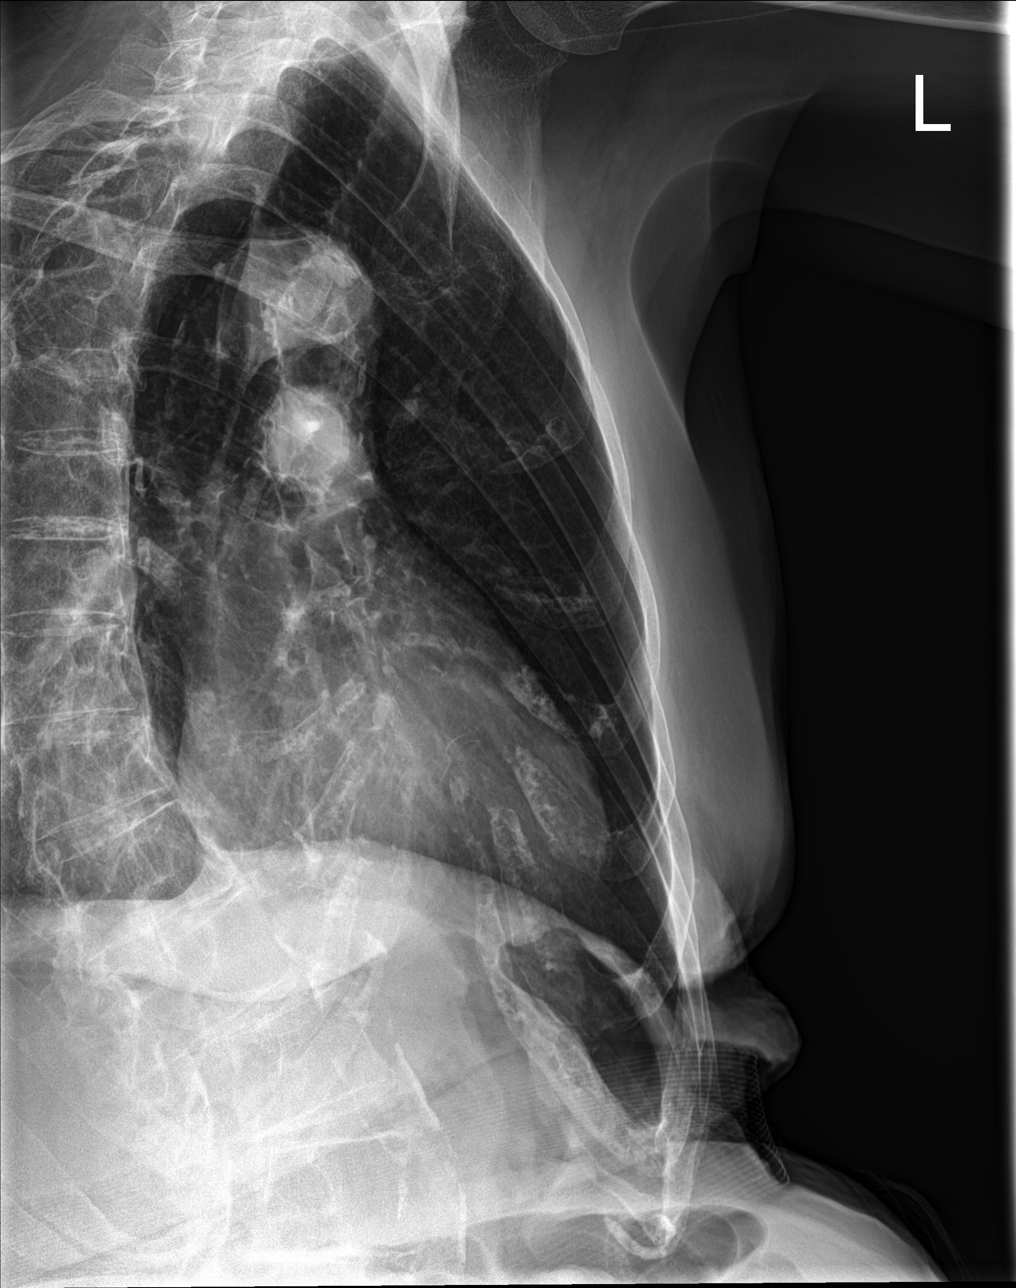

[rib obl (2 of 2)]
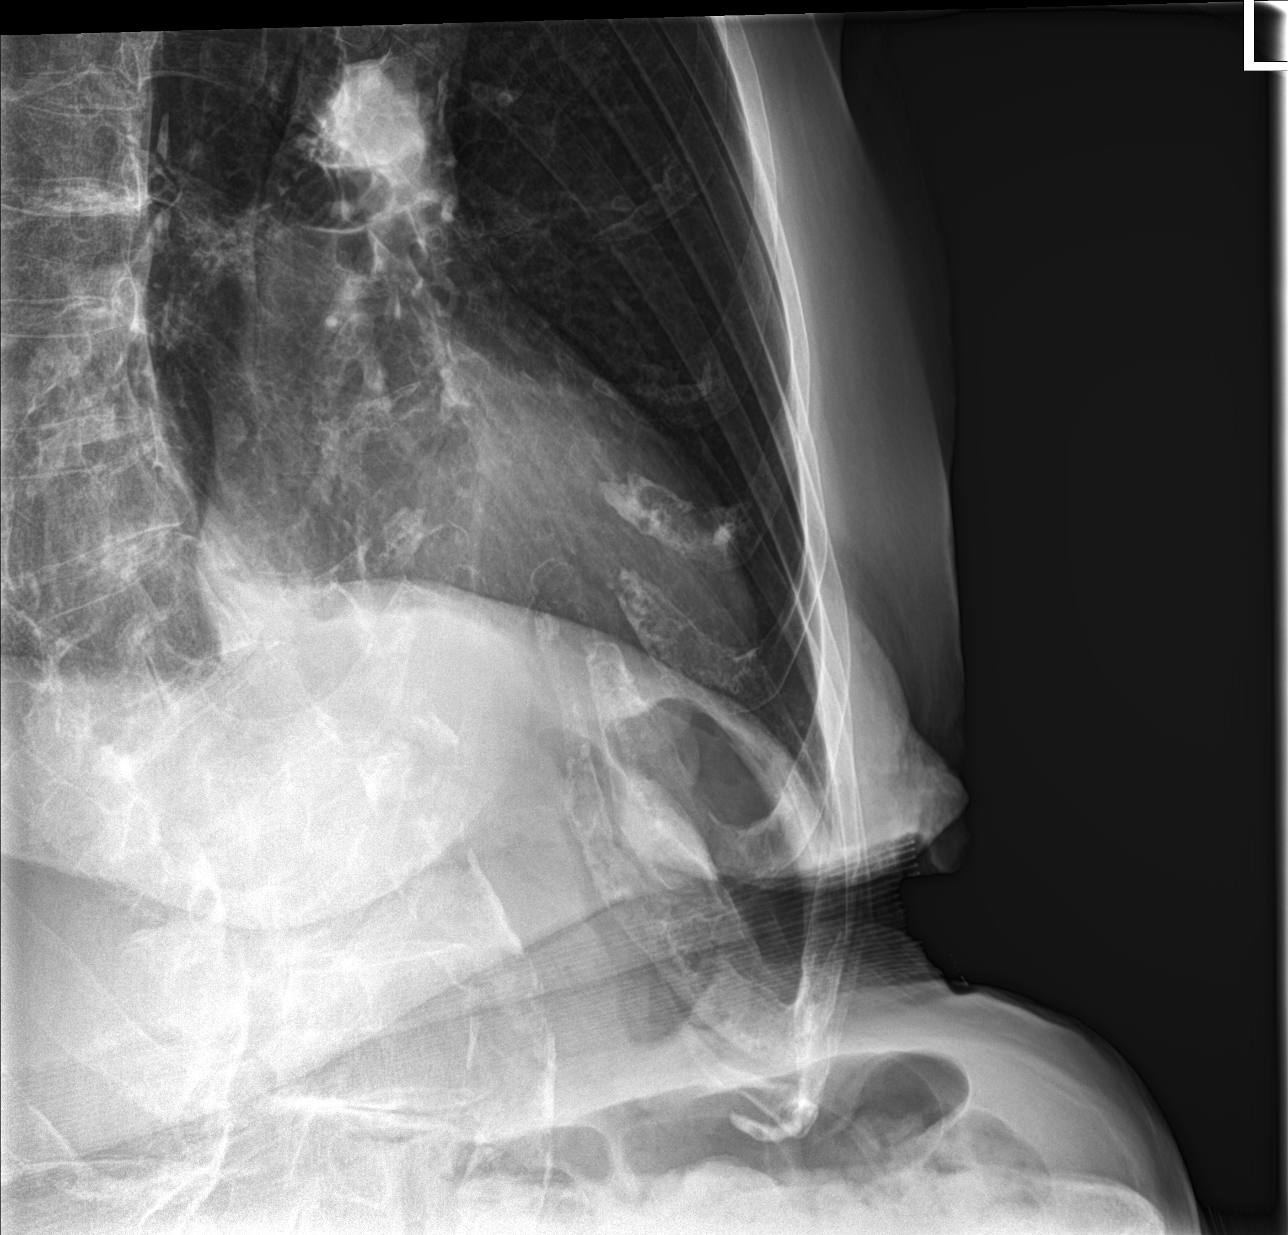

[3 of 3 positions shown; findings below may reference images not displayed]

FINDINGS: Stable mild cardiac enlargement and tortuosity and calcification of
the thoracic aorta. Stable hyperinflation and emphysematous changes.
No infiltrates, edema or effusion.

Dedicated views of the knee left ribs demonstrate a mildly displaced
left eighth and ninth rib fractures.
IMPRESSION: No acute cardiopulmonary findings.

Left eighth and ninth rib fractures.

## 2017-07-05 DIAGNOSIS — H353211 Exudative age-related macular degeneration, right eye, with active choroidal neovascularization: Secondary | ICD-10-CM | POA: Diagnosis not present

## 2017-07-05 DIAGNOSIS — H353221 Exudative age-related macular degeneration, left eye, with active choroidal neovascularization: Secondary | ICD-10-CM | POA: Diagnosis not present

## 2017-07-24 ENCOUNTER — Encounter: Payer: Self-pay | Admitting: Family Medicine

## 2017-07-24 ENCOUNTER — Ambulatory Visit (INDEPENDENT_AMBULATORY_CARE_PROVIDER_SITE_OTHER): Payer: Medicare Other | Admitting: Family Medicine

## 2017-07-24 VITALS — BP 136/70 | HR 88 | Temp 97.1°F | Ht 61.0 in | Wt 116.0 lb

## 2017-07-24 DIAGNOSIS — M25551 Pain in right hip: Secondary | ICD-10-CM

## 2017-07-24 DIAGNOSIS — M25561 Pain in right knee: Secondary | ICD-10-CM | POA: Diagnosis not present

## 2017-07-24 DIAGNOSIS — R531 Weakness: Secondary | ICD-10-CM | POA: Diagnosis not present

## 2017-07-24 DIAGNOSIS — I10 Essential (primary) hypertension: Secondary | ICD-10-CM

## 2017-07-24 DIAGNOSIS — M25552 Pain in left hip: Secondary | ICD-10-CM

## 2017-07-24 DIAGNOSIS — E559 Vitamin D deficiency, unspecified: Secondary | ICD-10-CM | POA: Diagnosis not present

## 2017-07-24 DIAGNOSIS — M25562 Pain in left knee: Secondary | ICD-10-CM | POA: Diagnosis not present

## 2017-07-24 DIAGNOSIS — R2681 Unsteadiness on feet: Secondary | ICD-10-CM

## 2017-07-24 DIAGNOSIS — E039 Hypothyroidism, unspecified: Secondary | ICD-10-CM

## 2017-07-24 DIAGNOSIS — E7800 Pure hypercholesterolemia, unspecified: Secondary | ICD-10-CM | POA: Diagnosis not present

## 2017-07-24 DIAGNOSIS — G8929 Other chronic pain: Secondary | ICD-10-CM | POA: Diagnosis not present

## 2017-07-24 MED ORDER — TRIAMTERENE-HCTZ 37.5-25 MG PO TABS: 0.5000 | ORAL_TABLET | Freq: Every day | ORAL | 3 refills | Status: DC

## 2017-07-24 MED ORDER — RISEDRONATE SODIUM 35 MG PO TABS: ORAL_TABLET | ORAL | 3 refills | Status: DC

## 2017-07-24 MED ORDER — LEVOTHYROXINE SODIUM 100 MCG PO TABS: 100.0000 ug | ORAL_TABLET | Freq: Every day | ORAL | 1 refills | Status: DC

## 2017-07-24 MED ORDER — LEVOTHYROXINE SODIUM 25 MCG PO TABS: ORAL_TABLET | ORAL | 1 refills | Status: DC

## 2017-07-24 NOTE — Progress Notes (Signed)
Subjective:    Patient ID: Janice Reed, female    DOB: [birth year], 83 y.o.   MRN: [id number]  HPI Pt here for follow up and management of chronic medical problems which includes hypertension and hyperlipidemia. He is taking medication regularly.  The patient today is complaining of some right hip and side pain it and it feels like muscle spasms at times.  She also has some knee pain.  She is due to return in FOBT and get lab work is requesting refills on all of her medicines.  She is pleasant and alert especially for being 83 years old.  She has severe kyphoscoliosis.  She denies any chest pain any more than usual and has no more shortness of breath than usual.  She does have some constipation but other than that no nausea vomiting diarrhea blood in the stool or black tarry bowel movements or trouble swallowing.  She has no burning when she passes her water but she does have some problems with starting her stream at times.  Is far as the right hip and side pain or concerned is more in the anterior iliac crest area on the right side and a core she is very kyphotic.  She says this pain is not necessarily associated with activity or movement.  She also indicates that she knows she needs to drink more water.  She has some pain in both ankles 2 and some edema in her feet and she has a long history back years ago when she had phlebitis.     Patient Active Problem List   Diagnosis Date Noted  . Benign essential HTN 05/22/2014  . Age-related macular degeneration, wet, both eyes ([facility]) 03/12/2014  . Internal hemorrhoids without mention of complication 59/56/3875  . Immune fecal occult blood test + x 2 01/05/2012  . Cancer of right breast ([facility]) 09/28/2011  . Hearing loss 12/27/2010  . Arthritis 12/27/2010  . Hyperlipidemia   . Phlebitis and thrombophlebitis of unspecified site   . Symptomatic menopausal or female climacteric states   . Malaise and fatigue   . Palpitations   . Nonspecific abnormal  electrocardiogram (ECG) (EKG)   . Hypothyroidism   . Osteoporosis   . IBS (irritable bowel syndrome)    Outpatient Encounter Medications as of 07/24/2017  Medication Sig  . Aflibercept (EYLEA IO) Inject into the eye every 30 (thirty) days.   [name] Kitchen aspirin 325 MG tablet Take 325 mg by mouth See admin instructions. Take one tablet a day and more if needed for pain - up to 3 times daily  . bevacizumab (AVASTIN) 1.25 mg/0.1 mL SOLN 1.25 mg by Intravitreal route.   . Calcium-Magnesium-Vitamin D (CITRACAL CALCIUM+D PO) Take 1 capsule by mouth 2 (two) times daily.    . Cholecalciferol (VITAMIN D3) 1000 UNITS CAPS Take 1,000 Units by mouth 2 (two) times daily.   [name] Kitchen levothyroxine (SYNTHROID, LEVOTHROID) 100 MCG tablet Take 1 tablet (100 mcg total) by mouth daily.  [name] Kitchen levothyroxine (SYNTHROID, LEVOTHROID) 25 MCG tablet TAKE 1 TABLET (25 MCG TOTAL) BY MOUTH DAILY BEFORE BREAKFAST. AS DIRECTED  . Multiple Vitamins-Minerals (ICAPS AREDS FORMULA PO) Take 1 capsule by mouth 2 (two) times daily.   . Omega-3 Fatty Acids (FISH OIL) 1000 MG CAPS Take 1,000 mg by mouth 2 (two) times daily.   . risedronate (ACTONEL) 35 MG tablet TAKE 1 TABLET BY MOUTH  EVERY 7 DAYS WITH WATER ON  EMPTY STOMACH, NOTHING BY  MOUTH OR LIE DOWN FOR NEXT  30  MINUTES.  . Risedronate Sodium 35 MG TBEC Take 1 tablet (35 mg total) by mouth once a week.  . triamterene-hydrochlorothiazide (MAXZIDE-25) 37.5-25 MG tablet Take 0.5 tablets by mouth daily.   No facility-administered encounter medications on file as of 07/24/2017.      Review of Systems  Constitutional: Negative.   HENT: Negative.   Eyes: Negative.   Respiratory: Negative.   Cardiovascular: Negative.   Gastrointestinal: Negative.   Endocrine: Negative.   Genitourinary: Negative.   Musculoskeletal: Positive for arthralgias (right side and hip discomfort).  Skin: Negative.   Allergic/Immunologic: Negative.   Neurological: Negative.   Hematological: Negative.     Psychiatric/Behavioral: Negative.        Objective:   Physical Exam  Constitutional: She is oriented to person, place, and time. She appears well-developed. No distress.  The patient is elderly kyphotic but alert at her age of 83.  HENT:  Head: Normocephalic and atraumatic.  Right Ear: External ear normal.  Left Ear: External ear normal.  Nose: Nose normal.  Mouth/Throat: Oropharynx is clear and moist. No oropharyngeal exudate.  Eyes: Conjunctivae and EOM are normal. Pupils are equal, round, and reactive to light. Right eye exhibits no discharge. Left eye exhibits no discharge. No scleral icterus.  Neck: Normal range of motion. Neck supple. No thyromegaly present.  Cardiovascular: Normal rate and regular rhythm.  No murmur heard. Heart is regular at 72/min  Pulmonary/Chest: Effort normal and breath sounds normal. No respiratory distress. She has no wheezes. She has no rales.  Clear anteriorly and posteriorly  Abdominal: Soft. Bowel sounds are normal. She exhibits no mass. There is no tenderness. There is no rebound and no guarding.  No abdominal tenderness masses or inguinal adenopathy  Musculoskeletal: She exhibits tenderness. She exhibits no edema.  The patient uses a walker for ambulation mostly because of chronic back pain and kyphoscoliosis.  She is hurting in the right iliac crest area.  Lymphadenopathy:    She has no cervical adenopathy.  Neurological: She is alert and oriented to person, place, and time. She has normal reflexes. No cranial nerve deficit.  Skin: Skin is warm and dry. No rash noted.  Psychiatric: She has a normal mood and affect. Her behavior is normal. Judgment and thought content normal.  Nursing note and vitals reviewed.   BP 136/70 (BP Location: Left Arm)   Pulse 88   Temp (!) 97.1 F (36.2 C) (Oral)   Ht 5' 1" (1.549 m)   Wt 116 lb (52.6 kg)   BMI 21.92 kg/m   40 of Depo-Medrol IM to the right iliac crest area     Assessment & Plan:  1.  Weakness -This is an ongoing problem because of her age and past history of falls and having to use a walker. - CBC with Differential/Platelet - Thyroid Panel With TSH  2. Benign essential HTN -The blood pressure is stable and we will continue to monitor this. - BMP8+EGFR - CBC with Differential/Platelet - Hepatic function panel  3. Pure hypercholesterolemia -Continue with aggressive therapeutic lifestyle changes as much as possible - CBC with Differential/Platelet - Lipid panel  4. Vitamin D deficiency -Continue with vitamin D replacement pending results of lab work - CBC with Differential/Platelet - VITAMIN D 25 Hydroxy (Vit-D Deficiency, Fractures)  5. Gait instability -Because of her age and arthritic problems and kyphoscoliosis she will continue to need the walker that she is currently using today. - CBC with Differential/Platelet  6. Hypothyroidism, unspecified type - Thyroid Panel  With TSH  7. Chronic arthralgias of knees and hips -40 of Depo-Medrol to trigger point and right iliac crest area will be injected after sterile preparation of the area.  Meds ordered this encounter  Medications  . risedronate (ACTONEL) 35 MG tablet    Sig: TAKE 1 TABLET BY MOUTH  EVERY 7 DAYS WITH WATER ON  EMPTY STOMACH, NOTHING BY  MOUTH OR LIE DOWN FOR NEXT  30 MINUTES.    Dispense:  12 tablet    Refill:  3  . triamterene-hydrochlorothiazide (MAXZIDE-25) 37.5-25 MG tablet    Sig: Take 0.5 tablets by mouth daily.    Dispense:  45 tablet    Refill:  3  . levothyroxine (SYNTHROID, LEVOTHROID) 25 MCG tablet    Sig: TAKE 1 TABLET (25 MCG TOTAL) BY MOUTH DAILY BEFORE BREAKFAST. AS DIRECTED    Dispense:  90 tablet    Refill:  1  . levothyroxine (SYNTHROID, LEVOTHROID) 100 MCG tablet    Sig: Take 1 tablet (100 mcg total) by mouth daily.    Dispense:  90 tablet    Refill:  1   Patient Instructions                       Medicare Annual Wellness Visit  [facility] and the medical  providers at [facility] strive to bring you the best medical care.  In doing so we not only want to address your current medical conditions and concerns but also to detect new conditions early and prevent illness, disease and health-related problems.    Medicare offers a yearly Wellness Visit which allows our clinical staff to assess your need for preventative services including immunizations, lifestyle education, counseling to decrease risk of preventable diseases and screening for fall risk and other medical concerns.    This visit is provided free of charge (no copay) for all Medicare recipients. The clinical pharmacists at [facility] have begun to conduct these Wellness Visits which will also include a thorough review of all your medications.    As you primary medical provider recommend that you make an appointment for your Annual Wellness Visit if you have not done so already this year.  You may set up this appointment before you leave today or you may call back ([phone]) and schedule an appointment.  Please make sure when you call that you mention that you are scheduling your Annual Wellness Visit with the clinical pharmacist so that the appointment may be made for the proper length of time.     Continue current medications. Continue good therapeutic lifestyle changes which include good diet and exercise. Fall precautions discussed with patient. If an FOBT was given today- please return it to our front desk. If you are over 39 years old - you may need Prevnar 37 or the adult Pneumonia vaccine.  **Flu shots are available--- please call and schedule a FLU-CLINIC appointment**  After your visit with [place] today you will receive a survey in the mail or online from [employer] regarding your care with [place]. Please take a moment to fill this out. Your feedback is very important to [place] as you can help [place] better understand your patient needs as well as  improve your experience and satisfaction. WE CARE ABOUT YOU!!!     Arrie Senate MD

## 2017-07-24 NOTE — Patient Instructions (Signed)
Medicare Annual Wellness Visit  [facility] and the medical providers at [facility] strive to bring you the best medical care.  In doing so we not only want to address your current medical conditions and concerns but also to detect new conditions early and prevent illness, disease and health-related problems.    Medicare offers a yearly Wellness Visit which allows our clinical staff to assess your need for preventative services including immunizations, lifestyle education, counseling to decrease risk of preventable diseases and screening for fall risk and other medical concerns.    This visit is provided free of charge (no copay) for all Medicare recipients. The clinical pharmacists at [facility] have begun to conduct these Wellness Visits which will also include a thorough review of all your medications.    As you primary medical provider recommend that you make an appointment for your Annual Wellness Visit if you have not done so already this year.  You may set up this appointment before you leave today or you may call back ([phone]) and schedule an appointment.  Please make sure when you call that you mention that you are scheduling your Annual Wellness Visit with the clinical pharmacist so that the appointment may be made for the proper length of time.     Continue current medications. Continue good therapeutic lifestyle changes which include good diet and exercise. Fall precautions discussed with patient. If an FOBT was given today- please return it to our front desk. If you are over 50 years old - you may need Prevnar 13 or the adult Pneumonia vaccine.  **Flu shots are available--- please call and schedule a FLU-CLINIC appointment**  After your visit with us today you will receive a survey in the mail or online from [employer] regarding your care with us. Please take a moment to fill this out. Your feedback is very  important to us as you can help us better understand your patient needs as well as improve your experience and satisfaction. WE CARE ABOUT YOU!!!    

## 2017-07-25 LAB — THYROID PANEL WITH TSH
FREE THYROXINE INDEX: 2.8 (ref 1.2–4.9)
T3 UPTAKE RATIO: 28 % (ref 24–39)
T4 TOTAL: 10.1 ug/dL (ref 4.5–12.0)
TSH: 0.338 u[IU]/mL — AB (ref 0.450–4.500)

## 2017-07-25 LAB — CBC WITH DIFFERENTIAL/PLATELET
BASOS ABS: 0.1 10*3/uL (ref 0.0–0.2)
Basos: 2 %
EOS (ABSOLUTE): 0.1 10*3/uL (ref 0.0–0.4)
Eos: 3 %
HEMOGLOBIN: 13.9 g/dL (ref 11.1–15.9)
Hematocrit: 42.8 % (ref 34.0–46.6)
Immature Grans (Abs): 0 10*3/uL (ref 0.0–0.1)
Immature Granulocytes: 0 %
LYMPHS ABS: 1.2 10*3/uL (ref 0.7–3.1)
Lymphs: 26 %
MCH: 30.6 pg (ref 26.6–33.0)
MCHC: 32.5 g/dL (ref 31.5–35.7)
MCV: 94 fL (ref 79–97)
Monocytes Absolute: 0.5 10*3/uL (ref 0.1–0.9)
Monocytes: 11 %
NEUTROS ABS: 2.8 10*3/uL (ref 1.4–7.0)
Neutrophils: 58 %
Platelets: 206 10*3/uL (ref 150–379)
RBC: 4.54 x10E6/uL (ref 3.77–5.28)
RDW: 14.1 % (ref 12.3–15.4)
WBC: 4.7 10*3/uL (ref 3.4–10.8)

## 2017-07-25 LAB — LIPID PANEL
CHOL/HDL RATIO: 2.6 ratio (ref 0.0–4.4)
CHOLESTEROL TOTAL: 202 mg/dL — AB (ref 100–199)
HDL: 77 mg/dL (ref >39)
LDL CALC: 109 mg/dL — AB (ref 0–99)
Triglycerides: 78 mg/dL (ref 0–149)
VLDL Cholesterol Cal: 16 mg/dL (ref 5–40)

## 2017-07-25 LAB — BMP8+EGFR
BUN / CREAT RATIO: 22 (ref 12–28)
BUN: 22 mg/dL (ref 10–36)
CHLORIDE: 99 mmol/L (ref 96–106)
CO2: 28 mmol/L (ref 20–29)
Calcium: 9.5 mg/dL (ref 8.7–10.3)
Creatinine, Ser: 0.98 mg/dL (ref 0.57–1.00)
GFR calc Af Amer: 58 mL/min/{1.73_m2} — ABNORMAL LOW (ref >59)
GFR calc non Af Amer: 50 mL/min/{1.73_m2} — ABNORMAL LOW (ref >59)
Glucose: 164 mg/dL — ABNORMAL HIGH (ref 65–99)
POTASSIUM: 4.4 mmol/L (ref 3.5–5.2)
Sodium: 140 mmol/L (ref 134–144)

## 2017-07-25 LAB — HEPATIC FUNCTION PANEL
ALT: 13 IU/L (ref 0–32)
AST: 21 IU/L (ref 0–40)
Albumin: 4.1 g/dL (ref 3.2–4.6)
Alkaline Phosphatase: 104 IU/L (ref 39–117)
BILIRUBIN, DIRECT: 0.1 mg/dL (ref 0.00–0.40)
Bilirubin Total: 0.3 mg/dL (ref 0.0–1.2)
Total Protein: 6.6 g/dL (ref 6.0–8.5)

## 2017-07-25 LAB — VITAMIN D 25 HYDROXY (VIT D DEFICIENCY, FRACTURES): VIT D 25 HYDROXY: 56.3 ng/mL (ref 30.0–100.0)

## 2017-08-09 DIAGNOSIS — H353221 Exudative age-related macular degeneration, left eye, with active choroidal neovascularization: Secondary | ICD-10-CM | POA: Diagnosis not present

## 2017-08-09 DIAGNOSIS — H353211 Exudative age-related macular degeneration, right eye, with active choroidal neovascularization: Secondary | ICD-10-CM | POA: Diagnosis not present

## 2017-08-20 DIAGNOSIS — M1380 Other specified arthritis, unspecified site: Secondary | ICD-10-CM | POA: Diagnosis not present

## 2017-09-11 DIAGNOSIS — H353211 Exudative age-related macular degeneration, right eye, with active choroidal neovascularization: Secondary | ICD-10-CM | POA: Diagnosis not present

## 2017-09-11 DIAGNOSIS — H353221 Exudative age-related macular degeneration, left eye, with active choroidal neovascularization: Secondary | ICD-10-CM | POA: Diagnosis not present

## 2017-09-19 DIAGNOSIS — M9904 Segmental and somatic dysfunction of sacral region: Secondary | ICD-10-CM | POA: Diagnosis not present

## 2017-09-19 DIAGNOSIS — M5137 Other intervertebral disc degeneration, lumbosacral region: Secondary | ICD-10-CM | POA: Diagnosis not present

## 2017-09-19 DIAGNOSIS — M9903 Segmental and somatic dysfunction of lumbar region: Secondary | ICD-10-CM | POA: Diagnosis not present

## 2017-09-19 DIAGNOSIS — M9905 Segmental and somatic dysfunction of pelvic region: Secondary | ICD-10-CM | POA: Diagnosis not present

## 2017-09-20 DIAGNOSIS — M1380 Other specified arthritis, unspecified site: Secondary | ICD-10-CM | POA: Diagnosis not present

## 2017-09-24 DIAGNOSIS — M9903 Segmental and somatic dysfunction of lumbar region: Secondary | ICD-10-CM | POA: Diagnosis not present

## 2017-09-24 DIAGNOSIS — M9904 Segmental and somatic dysfunction of sacral region: Secondary | ICD-10-CM | POA: Diagnosis not present

## 2017-09-24 DIAGNOSIS — M9905 Segmental and somatic dysfunction of pelvic region: Secondary | ICD-10-CM | POA: Diagnosis not present

## 2017-09-24 DIAGNOSIS — M5137 Other intervertebral disc degeneration, lumbosacral region: Secondary | ICD-10-CM | POA: Diagnosis not present

## 2017-09-27 DIAGNOSIS — M9904 Segmental and somatic dysfunction of sacral region: Secondary | ICD-10-CM | POA: Diagnosis not present

## 2017-09-27 DIAGNOSIS — M9903 Segmental and somatic dysfunction of lumbar region: Secondary | ICD-10-CM | POA: Diagnosis not present

## 2017-09-27 DIAGNOSIS — M9905 Segmental and somatic dysfunction of pelvic region: Secondary | ICD-10-CM | POA: Diagnosis not present

## 2017-09-27 DIAGNOSIS — M5137 Other intervertebral disc degeneration, lumbosacral region: Secondary | ICD-10-CM | POA: Diagnosis not present

## 2017-10-01 DIAGNOSIS — M5137 Other intervertebral disc degeneration, lumbosacral region: Secondary | ICD-10-CM | POA: Diagnosis not present

## 2017-10-01 DIAGNOSIS — M9905 Segmental and somatic dysfunction of pelvic region: Secondary | ICD-10-CM | POA: Diagnosis not present

## 2017-10-01 DIAGNOSIS — M9903 Segmental and somatic dysfunction of lumbar region: Secondary | ICD-10-CM | POA: Diagnosis not present

## 2017-10-01 DIAGNOSIS — M9904 Segmental and somatic dysfunction of sacral region: Secondary | ICD-10-CM | POA: Diagnosis not present

## 2017-10-03 DIAGNOSIS — M9904 Segmental and somatic dysfunction of sacral region: Secondary | ICD-10-CM | POA: Diagnosis not present

## 2017-10-03 DIAGNOSIS — M9903 Segmental and somatic dysfunction of lumbar region: Secondary | ICD-10-CM | POA: Diagnosis not present

## 2017-10-03 DIAGNOSIS — M5137 Other intervertebral disc degeneration, lumbosacral region: Secondary | ICD-10-CM | POA: Diagnosis not present

## 2017-10-03 DIAGNOSIS — M9905 Segmental and somatic dysfunction of pelvic region: Secondary | ICD-10-CM | POA: Diagnosis not present

## 2017-10-08 DIAGNOSIS — M9904 Segmental and somatic dysfunction of sacral region: Secondary | ICD-10-CM | POA: Diagnosis not present

## 2017-10-08 DIAGNOSIS — M9905 Segmental and somatic dysfunction of pelvic region: Secondary | ICD-10-CM | POA: Diagnosis not present

## 2017-10-08 DIAGNOSIS — M9903 Segmental and somatic dysfunction of lumbar region: Secondary | ICD-10-CM | POA: Diagnosis not present

## 2017-10-08 DIAGNOSIS — M5137 Other intervertebral disc degeneration, lumbosacral region: Secondary | ICD-10-CM | POA: Diagnosis not present

## 2017-10-11 DIAGNOSIS — M9903 Segmental and somatic dysfunction of lumbar region: Secondary | ICD-10-CM | POA: Diagnosis not present

## 2017-10-11 DIAGNOSIS — M5137 Other intervertebral disc degeneration, lumbosacral region: Secondary | ICD-10-CM | POA: Diagnosis not present

## 2017-10-11 DIAGNOSIS — M9904 Segmental and somatic dysfunction of sacral region: Secondary | ICD-10-CM | POA: Diagnosis not present

## 2017-10-11 DIAGNOSIS — M9905 Segmental and somatic dysfunction of pelvic region: Secondary | ICD-10-CM | POA: Diagnosis not present

## 2017-10-15 DIAGNOSIS — M9904 Segmental and somatic dysfunction of sacral region: Secondary | ICD-10-CM | POA: Diagnosis not present

## 2017-10-15 DIAGNOSIS — M9903 Segmental and somatic dysfunction of lumbar region: Secondary | ICD-10-CM | POA: Diagnosis not present

## 2017-10-15 DIAGNOSIS — M9905 Segmental and somatic dysfunction of pelvic region: Secondary | ICD-10-CM | POA: Diagnosis not present

## 2017-10-15 DIAGNOSIS — M5137 Other intervertebral disc degeneration, lumbosacral region: Secondary | ICD-10-CM | POA: Diagnosis not present

## 2017-10-16 DIAGNOSIS — H353221 Exudative age-related macular degeneration, left eye, with active choroidal neovascularization: Secondary | ICD-10-CM | POA: Diagnosis not present

## 2017-10-16 DIAGNOSIS — H353211 Exudative age-related macular degeneration, right eye, with active choroidal neovascularization: Secondary | ICD-10-CM | POA: Diagnosis not present

## 2017-10-18 DIAGNOSIS — M9903 Segmental and somatic dysfunction of lumbar region: Secondary | ICD-10-CM | POA: Diagnosis not present

## 2017-10-18 DIAGNOSIS — M9904 Segmental and somatic dysfunction of sacral region: Secondary | ICD-10-CM | POA: Diagnosis not present

## 2017-10-18 DIAGNOSIS — M1380 Other specified arthritis, unspecified site: Secondary | ICD-10-CM | POA: Diagnosis not present

## 2017-10-18 DIAGNOSIS — M9905 Segmental and somatic dysfunction of pelvic region: Secondary | ICD-10-CM | POA: Diagnosis not present

## 2017-10-18 DIAGNOSIS — M5137 Other intervertebral disc degeneration, lumbosacral region: Secondary | ICD-10-CM | POA: Diagnosis not present

## 2017-11-15 DIAGNOSIS — M9903 Segmental and somatic dysfunction of lumbar region: Secondary | ICD-10-CM | POA: Diagnosis not present

## 2017-11-15 DIAGNOSIS — M9904 Segmental and somatic dysfunction of sacral region: Secondary | ICD-10-CM | POA: Diagnosis not present

## 2017-11-15 DIAGNOSIS — M9905 Segmental and somatic dysfunction of pelvic region: Secondary | ICD-10-CM | POA: Diagnosis not present

## 2017-11-15 DIAGNOSIS — M5137 Other intervertebral disc degeneration, lumbosacral region: Secondary | ICD-10-CM | POA: Diagnosis not present

## 2017-11-18 DIAGNOSIS — M1380 Other specified arthritis, unspecified site: Secondary | ICD-10-CM | POA: Diagnosis not present

## 2017-11-20 DIAGNOSIS — H353211 Exudative age-related macular degeneration, right eye, with active choroidal neovascularization: Secondary | ICD-10-CM | POA: Diagnosis not present

## 2017-11-20 DIAGNOSIS — H353221 Exudative age-related macular degeneration, left eye, with active choroidal neovascularization: Secondary | ICD-10-CM | POA: Diagnosis not present

## 2017-11-28 ENCOUNTER — Encounter: Payer: Self-pay | Admitting: Family Medicine

## 2017-11-28 ENCOUNTER — Ambulatory Visit (INDEPENDENT_AMBULATORY_CARE_PROVIDER_SITE_OTHER): Payer: Medicare Other | Admitting: Family Medicine

## 2017-11-28 VITALS — BP 148/53 | HR 63 | Temp 97.5°F | Ht 61.0 in | Wt 115.0 lb

## 2017-11-28 DIAGNOSIS — M25551 Pain in right hip: Secondary | ICD-10-CM

## 2017-11-28 DIAGNOSIS — R531 Weakness: Secondary | ICD-10-CM | POA: Diagnosis not present

## 2017-11-28 DIAGNOSIS — M25552 Pain in left hip: Secondary | ICD-10-CM

## 2017-11-28 DIAGNOSIS — Z171 Estrogen receptor negative status [ER-]: Secondary | ICD-10-CM

## 2017-11-28 DIAGNOSIS — G8929 Other chronic pain: Secondary | ICD-10-CM | POA: Diagnosis not present

## 2017-11-28 DIAGNOSIS — E7800 Pure hypercholesterolemia, unspecified: Secondary | ICD-10-CM

## 2017-11-28 DIAGNOSIS — R29898 Other symptoms and signs involving the musculoskeletal system: Secondary | ICD-10-CM | POA: Diagnosis not present

## 2017-11-28 DIAGNOSIS — E039 Hypothyroidism, unspecified: Secondary | ICD-10-CM

## 2017-11-28 DIAGNOSIS — I10 Essential (primary) hypertension: Secondary | ICD-10-CM

## 2017-11-28 DIAGNOSIS — R2681 Unsteadiness on feet: Secondary | ICD-10-CM

## 2017-11-28 DIAGNOSIS — E559 Vitamin D deficiency, unspecified: Secondary | ICD-10-CM

## 2017-11-28 DIAGNOSIS — Z9181 History of falling: Secondary | ICD-10-CM

## 2017-11-28 DIAGNOSIS — M25561 Pain in right knee: Secondary | ICD-10-CM

## 2017-11-28 DIAGNOSIS — C50911 Malignant neoplasm of unspecified site of right female breast: Secondary | ICD-10-CM

## 2017-11-28 DIAGNOSIS — M25562 Pain in left knee: Secondary | ICD-10-CM

## 2017-11-28 NOTE — Patient Instructions (Addendum)
Medicare Annual Wellness Visit  [facility] and the medical providers at [facility] strive to bring you the best medical care.  In doing so we not only want to address your current medical conditions and concerns but also to detect new conditions early and prevent illness, disease and health-related problems.    Medicare offers a yearly Wellness Visit which allows our clinical staff to assess your need for preventative services including immunizations, lifestyle education, counseling to decrease risk of preventable diseases and screening for fall risk and other medical concerns.    This visit is provided free of charge (no copay) for all Medicare recipients. The clinical pharmacists at [facility] have begun to conduct these Wellness Visits which will also include a thorough review of all your medications.    As you primary medical provider recommend that you make an appointment for your Annual Wellness Visit if you have not done so already this year.  You may set up this appointment before you leave today or you may call back ([phone]) and schedule an appointment.  Please make sure when you call that you mention that you are scheduling your Annual Wellness Visit with the clinical pharmacist so that the appointment may be made for the proper length of time.     Continue current medications. Continue good therapeutic lifestyle changes which include good diet and exercise. Fall precautions discussed with patient. If an FOBT was given today- please return it to our front desk. If you are over 37 years old - you may need Prevnar 22 or the adult Pneumonia vaccine.  **Flu shots are available--- please call and schedule a FLU-CLINIC appointment**  After your visit with [place] today you will receive a survey in the mail or online from [employer] regarding your care with [place]. Please take a moment to fill this out. Your feedback is very  important to [place] as you can help [place] better understand your patient needs as well as improve your experience and satisfaction. WE CARE ABOUT YOU!!!   Continue coated aspirin at least once daily after breakfast Take Tylenol 1 at bedtime and 1 during the day if needed for arthritis pain Continue to drink plenty of fluids and stay well-hydrated Continue to use her walker regularly and be careful not to put yourself at risk for falling

## 2017-11-28 NOTE — Progress Notes (Signed)
Subjective:    Patient ID: Janice Reed, female    DOB: [birth year], 84 y.o.   MRN: [id number]  HPI Pt here for follow up and management of chronic medical problems which includes hypertension and hyperlipidemia. She is taking medication regularly.  The patient today complains of arthritis and leg weakness.  She is due to return in FOBT and will get lab work today.  She comes using a rolling walker with severe kyphosis.  The patient says that her leg weakness and arthritis do go together and it is always worse in the morning.  Sometimes it is not as bad as others.  She uses her walker regularly.  She does take a 325 mg coated aspirin daily and sometimes will take an extra coated aspirin at bedtime.  I encouraged her to take more Tylenol and that it was safe for her to do this taking 1 twice a day 1 at bedtime and 1 in the morning if needed.  She seemed to be reassured about this.  She denies any chest pain or any more shortness of breath than usual.  She has had some loose bowel movements recently and she is questioning whether the insurer could be playing a role with this and I told her that it was possible that it could.  She will leave off the Ensure for a week to see if the loose bowel movement get better and then restart it and see if they come back again and then she would know that the Ensure was causing the problem.  She denies any blood in the stool or black tarry bowel movements.  She has no trouble with swallowing.  She does have some urinary incontinence and urgency but no burning or pain.   Patient Active Problem List   Diagnosis Date Noted  . Benign essential HTN 05/22/2014  . Age-related macular degeneration, wet, both eyes ([place]) 03/12/2014  . Internal hemorrhoids without mention of complication 79/09/4095  . Immune fecal occult blood test + x 2 01/05/2012  . Cancer of right breast ([place]) 09/28/2011  . Hearing loss 12/27/2010  . Arthritis 12/27/2010  . Hyperlipidemia   . Phlebitis  and thrombophlebitis of unspecified site   . Symptomatic menopausal or female climacteric states   . Malaise and fatigue   . Palpitations   . Nonspecific abnormal electrocardiogram (ECG) (EKG)   . Hypothyroidism   . Osteoporosis   . IBS (irritable bowel syndrome)    Outpatient Encounter Medications as of 11/28/2017  Medication Sig  . aspirin 325 MG tablet Take 325 mg by mouth See admin instructions. Take one tablet a day and more if needed for pain - up to 3 times daily  . bevacizumab (AVASTIN) 1.25 mg/0.1 mL SOLN 1.25 mg by Intravitreal route.   . Calcium-Magnesium-Vitamin D (CITRACAL CALCIUM+D PO) Take 1 capsule by mouth 2 (two) times daily.    . Cholecalciferol (VITAMIN D3) 1000 UNITS CAPS Take 1,000 Units by mouth 2 (two) times daily.   [name] Kitchen levothyroxine (SYNTHROID, LEVOTHROID) 100 MCG tablet Take 1 tablet (100 mcg total) by mouth daily.  [name] Kitchen levothyroxine (SYNTHROID, LEVOTHROID) 25 MCG tablet TAKE 1 TABLET (25 MCG TOTAL) BY MOUTH DAILY BEFORE BREAKFAST. AS DIRECTED  . Omega-3 Fatty Acids (FISH OIL) 1000 MG CAPS Take 1,000 mg by mouth 2 (two) times daily.   . risedronate (ACTONEL) 35 MG tablet TAKE 1 TABLET BY MOUTH  EVERY 7 DAYS WITH WATER ON  EMPTY STOMACH, NOTHING BY  MOUTH OR LIE DOWN FOR  NEXT  30 MINUTES.  [name] Kitchen triamterene-hydrochlorothiazide (MAXZIDE-25) 37.5-25 MG tablet Take 0.5 tablets by mouth daily.  . [DISCONTINUED] Aflibercept (EYLEA IO) Inject into the eye every 30 (thirty) days.   . [DISCONTINUED] Multiple Vitamins-Minerals (ICAPS AREDS FORMULA PO) Take 1 capsule by mouth 2 (two) times daily.    No facility-administered encounter medications on file as of 11/28/2017.       Review of Systems  Constitutional: Negative.   HENT: Negative.   Eyes: Negative.   Respiratory: Negative.   Cardiovascular: Negative.   Gastrointestinal: Negative.   Endocrine: Negative.   Genitourinary: Negative.   Musculoskeletal: Positive for arthralgias.  Skin: Negative.   Allergic/Immunologic:  Negative.   Neurological: Positive for weakness (in legs ).  Hematological: Negative.   Psychiatric/Behavioral: Negative.        Objective:   Physical Exam  Constitutional: She is oriented to person, place, and time. She appears well-developed. No distress.  Elderly pleasant and alert  HENT:  Head: Normocephalic and atraumatic.  Right Ear: External ear normal.  Left Ear: External ear normal.  Mouth/Throat: Oropharynx is clear and moist. No oropharyngeal exudate.  Nasal turbinate congestion bilaterally  Eyes: Pupils are equal, round, and reactive to light. Conjunctivae and EOM are normal. Right eye exhibits no discharge. Left eye exhibits no discharge. No scleral icterus.  Neck: Normal range of motion. Neck supple. No thyromegaly present.  No bruits or thyromegaly  Cardiovascular: Normal rate, regular rhythm and normal heart sounds.  No murmur heard. Heart is regular at 60/min distal pulses difficult to palpate  Pulmonary/Chest: Effort normal and breath sounds normal. No respiratory distress. She has no wheezes. She has no rales.  Clear anteriorly and posteriorly  Abdominal: Soft. Bowel sounds are normal. She exhibits no mass. There is no tenderness. There is no rebound and no guarding.  No abdominal tenderness masses organ enlargement or suprapubic tenderness.  Musculoskeletal: Normal range of motion. She exhibits edema. She exhibits no tenderness.  Patient uses a rolling walker for ambulation and gait stability due to severe kyphosis.  And arthritis.  Stiffness and knees especially right greater than left  Lymphadenopathy:    She has no cervical adenopathy.  Neurological: She is alert and oriented to person, place, and time. She has normal reflexes. No cranial nerve deficit.  Skin: Skin is warm and dry. No rash noted.  Dry skin  Psychiatric: She has a normal mood and affect. Her behavior is normal. Judgment and thought content normal.  Nursing note and vitals reviewed.  BP (!)  148/53 (BP Location: Left Arm)   Pulse 63   Temp (!) 97.5 F (36.4 C) (Oral)   Ht _0  (1.549 m)   Wt 115 lb (52.2 kg)   BMI 21.73 kg/m         Assessment & Plan:  1. Benign essential HTN -Systolic blood pressure is slightly elevated but no change in medicines. - BMP8+EGFR - CBC with Differential/Platelet - Hepatic function panel  2. Pure hypercholesterolemia -Continue omega-3 fatty acids pending results of lab work and therapeutic lifestyle changes - CBC with Differential/Platelet - Lipid panel  3. Vitamin D deficiency -Continue vitamin D replacement - CBC with Differential/Platelet - VITAMIN D 25 Hydroxy (Vit-D Deficiency, Fractures)  4. Gait instability -Use walker regularly move slowly and do not be in a hurry - CBC with Differential/Platelet - Vitamin B12  5. Hypothyroidism, unspecified type -Continue current treatment pending results of lab work - CBC with Differential/Platelet - Thyroid Panel With TSH  6. Weakness -  Vitamin B12  7. Chronic arthralgias of knees and hips -Patient reassured that she could take more Tylenol daily  8. Weakness of both lower extremities -Continue to walk as much as possible and take Tylenol if needed for pain  9. At high risk for falls -Continue with walker  10. Malignant neoplasm of right breast in female, estrogen receptor negative, unspecified site of breast [facility]) -Check self regularly  Patient Instructions                       Medicare Annual Wellness Visit  [facility] and the medical providers at [facility] strive to bring you the best medical care.  In doing so we not only want to address your current medical conditions and concerns but also to detect new conditions early and prevent illness, disease and health-related problems.    Medicare offers a yearly Wellness Visit which allows our clinical staff to assess your need for preventative services including immunizations, lifestyle  education, counseling to decrease risk of preventable diseases and screening for fall risk and other medical concerns.    This visit is provided free of charge (no copay) for all Medicare recipients. The clinical pharmacists at [facility] have begun to conduct these Wellness Visits which will also include a thorough review of all your medications.    As you primary medical provider recommend that you make an appointment for your Annual Wellness Visit if you have not done so already this year.  You may set up this appointment before you leave today or you may call back ([phone]) and schedule an appointment.  Please make sure when you call that you mention that you are scheduling your Annual Wellness Visit with the clinical pharmacist so that the appointment may be made for the proper length of time.     Continue current medications. Continue good therapeutic lifestyle changes which include good diet and exercise. Fall precautions discussed with patient. If an FOBT was given today- please return it to our front desk. If you are over 19 years old - you may need Prevnar 37 or the adult Pneumonia vaccine.  **Flu shots are available--- please call and schedule a FLU-CLINIC appointment**  After your visit with [place] today you will receive a survey in the mail or online from [employer] regarding your care with [place]. Please take a moment to fill this out. Your feedback is very important to [place] as you can help [place] better understand your patient needs as well as improve your experience and satisfaction. WE CARE ABOUT YOU!!!   Continue coated aspirin at least once daily after breakfast Take Tylenol 1 at bedtime and 1 during the day if needed for arthritis pain Continue to drink plenty of fluids and stay well-hydrated Continue to use her walker regularly and be careful not to put yourself at risk for falling   Arrie Senate MD

## 2017-11-29 ENCOUNTER — Encounter: Payer: Self-pay | Admitting: Family Medicine

## 2017-11-29 LAB — CBC WITH DIFFERENTIAL/PLATELET
Basophils Absolute: 0.1 10*3/uL (ref 0.0–0.2)
Basos: 2 %
EOS (ABSOLUTE): 0.2 10*3/uL (ref 0.0–0.4)
Eos: 3 %
Hematocrit: 45.4 % (ref 34.0–46.6)
Hemoglobin: 15.1 g/dL (ref 11.1–15.9)
Immature Grans (Abs): 0 10*3/uL (ref 0.0–0.1)
Immature Granulocytes: 0 %
LYMPHS ABS: 1.7 10*3/uL (ref 0.7–3.1)
Lymphs: 31 %
MCH: 31.1 pg (ref 26.6–33.0)
MCHC: 33.3 g/dL (ref 31.5–35.7)
MCV: 94 fL (ref 79–97)
MONOS ABS: 0.5 10*3/uL (ref 0.1–0.9)
Monocytes: 10 %
NEUTROS ABS: 2.9 10*3/uL (ref 1.4–7.0)
Neutrophils: 54 %
PLATELETS: 195 10*3/uL (ref 150–379)
RBC: 4.85 x10E6/uL (ref 3.77–5.28)
RDW: 13.8 % (ref 12.3–15.4)
WBC: 5.4 10*3/uL (ref 3.4–10.8)

## 2017-11-29 LAB — LIPID PANEL
CHOLESTEROL TOTAL: 219 mg/dL — AB (ref 100–199)
Chol/HDL Ratio: 3 ratio (ref 0.0–4.4)
HDL: 72 mg/dL (ref >39)
LDL Calculated: 128 mg/dL — ABNORMAL HIGH (ref 0–99)
Triglycerides: 94 mg/dL (ref 0–149)
VLDL Cholesterol Cal: 19 mg/dL (ref 5–40)

## 2017-11-29 LAB — THYROID PANEL WITH TSH
Free Thyroxine Index: 3.3 (ref 1.2–4.9)
T3 UPTAKE RATIO: 31 % (ref 24–39)
T4, Total: 10.6 ug/dL (ref 4.5–12.0)
TSH: 0.195 u[IU]/mL — AB (ref 0.450–4.500)

## 2017-11-29 LAB — BMP8+EGFR
BUN / CREAT RATIO: 24 (ref 12–28)
BUN: 28 mg/dL (ref 10–36)
CALCIUM: 9.5 mg/dL (ref 8.7–10.3)
CHLORIDE: 99 mmol/L (ref 96–106)
CO2: 27 mmol/L (ref 20–29)
Creatinine, Ser: 1.17 mg/dL — ABNORMAL HIGH (ref 0.57–1.00)
GFR calc non Af Amer: 40 mL/min/{1.73_m2} — ABNORMAL LOW (ref >59)
GFR, EST AFRICAN AMERICAN: 46 mL/min/{1.73_m2} — AB (ref >59)
Glucose: 78 mg/dL (ref 65–99)
POTASSIUM: 4.4 mmol/L (ref 3.5–5.2)
SODIUM: 142 mmol/L (ref 134–144)

## 2017-11-29 LAB — HEPATIC FUNCTION PANEL
ALT: 10 IU/L (ref 0–32)
AST: 18 IU/L (ref 0–40)
Albumin: 4.2 g/dL (ref 3.2–4.6)
Alkaline Phosphatase: 89 IU/L (ref 39–117)
BILIRUBIN, DIRECT: 0.08 mg/dL (ref 0.00–0.40)
Bilirubin Total: 0.3 mg/dL (ref 0.0–1.2)
Total Protein: 6.8 g/dL (ref 6.0–8.5)

## 2017-11-29 LAB — VITAMIN B12: VITAMIN B 12: 370 pg/mL (ref 232–1245)

## 2017-11-29 LAB — VITAMIN D 25 HYDROXY (VIT D DEFICIENCY, FRACTURES): VIT D 25 HYDROXY: 59 ng/mL (ref 30.0–100.0)

## 2017-11-30 ENCOUNTER — Telehealth: Payer: Self-pay | Admitting: Family Medicine

## 2017-11-30 ENCOUNTER — Other Ambulatory Visit: Payer: Self-pay | Admitting: *Deleted

## 2017-11-30 NOTE — Telephone Encounter (Signed)
Patient was wrong on thyroid medicine she takes levothyroxine 144mcg daily and then levothyroxine 71mcg 1/2 tablet on MWF.

## 2017-12-01 NOTE — Telephone Encounter (Signed)
According to the most recent note, the patient is taking 100 mcg daily and one half of a 25 mcg on Monday Wednesday and Friday.  If that is correct, have her reduce the medication by taking 100 mcg daily and 1/2 pill on Sunday only.  Recheck a thyroid profile in 6 to 8 weeks.  She does not have to be fasting.  Please call patient with this information

## 2017-12-03 ENCOUNTER — Other Ambulatory Visit: Payer: Medicare Other

## 2017-12-03 ENCOUNTER — Ambulatory Visit (INDEPENDENT_AMBULATORY_CARE_PROVIDER_SITE_OTHER): Payer: Medicare Other | Admitting: *Deleted

## 2017-12-03 ENCOUNTER — Encounter: Payer: Self-pay | Admitting: *Deleted

## 2017-12-03 VITALS — BP 179/76 | HR 53 | Ht 60.0 in | Wt 117.0 lb

## 2017-12-03 DIAGNOSIS — E039 Hypothyroidism, unspecified: Secondary | ICD-10-CM

## 2017-12-03 DIAGNOSIS — M818 Other osteoporosis without current pathological fracture: Secondary | ICD-10-CM

## 2017-12-03 DIAGNOSIS — Z0000 Encounter for general adult medical examination without abnormal findings: Secondary | ICD-10-CM

## 2017-12-03 MED ORDER — LEVOTHYROXINE SODIUM 100 MCG PO TABS: 100.0000 ug | ORAL_TABLET | Freq: Every day | ORAL | 1 refills | Status: DC

## 2017-12-03 NOTE — Addendum Note (Signed)
Addended by: Ilean China on: 12/03/2017 04:36 PM   Modules accepted: Level of Service

## 2017-12-03 NOTE — Progress Notes (Addendum)
Subjective:   Janice Reed is a 84 y.o. female who presents for a subsequent Medicare Annual Wellness Visit. Ms Boss is widowed and lives at home by herself. Her husband passed away in 10-14-94. She has an adult son that lives in [place], [place] and an adult daughter that lives next door to her. She has 3 grandsons that are living and one that passed away at 36 from an aneurysm. Recently she has had a lady with the [facility] program come in to her home daily for 3 hours to help with any of her home care needs. She gave up driving in the fall and her daughter takes her to her appointments and to run errands. She has been using a walker since falling two years ago. She doesn't always feel that she needs it and wishes she could use the can instead but most of the people she has discussed this with have encouraged her to continue using the walker. She enjoys reading and she used to crochet, knit and work in her flowers. She isn't able to do any gardening now and she hasn't crocheted or knitted in a while.   Review of Systems    Overall health is about the same as last year.   Her balance is a little off  Musc: scoliosis and has some right knee pain from time to time. May be due to gait abnormality. Also has some generalized joint pain due to arthritis.   Neuropathy: some numbness/tingling in the tips of her right second and third digits  Cardiac Risk Factors include: advanced age (>56mn, >>55women);diabetes mellitus;hypertension;family history of premature cardiovascular disease;sedentary lifestyle  Urinary: urge incontinence    Objective:    Today's Vitals   12/03/17 1408 12/03/17 1411  BP: (!) 179/76   Pulse: (!) 53   Weight: 117 lb (53.1 kg)   Height: 5' (1.524 m)   PainSc:  3    Body mass index is 22.85 kg/m.  Advanced Directives 12/03/2017 12/05/2016 11/29/2016 11/09/2015 10/08/2015 11/04/2014  Does Patient Have a Medical Advance Directive? Yes Yes Yes No Yes Yes  Type of ACorporate treasurerof A[place]Living will [place]Living will [place] will - - -  Does patient want to make changes to medical advance directive? No - Patient declined - - - - -  Copy of HEminencein Chart? No - copy requested - No - copy requested - No - copy requested No - copy requested  Would patient like information on creating a medical advance directive? - - - No - patient declined information - -    Current Medications (verified) Outpatient Encounter Medications as of 12/03/2017  Medication Sig  . aspirin 325 MG tablet Take 325 mg by mouth See admin instructions. Take one tablet a day and more if needed for pain - up to 3 times daily  . bevacizumab (AVASTIN) 1.25 mg/0.1 mL SOLN 1.25 mg by Intravitreal route.   . Calcium-Magnesium-Vitamin D (CITRACAL CALCIUM+D PO) Take 1 capsule by mouth 2 (two) times daily.    . Cholecalciferol (VITAMIN D3) 1000 UNITS CAPS Take 1,000 Units by mouth 2 (two) times daily.   .[name] Kitchenlevothyroxine (SYNTHROID, LEVOTHROID) 100 MCG tablet Take 1 tablet (100 mcg total) by mouth daily.  .[name] Kitchenlevothyroxine (SYNTHROID, LEVOTHROID) 25 MCG tablet TAKE 1 TABLET (25 MCG TOTAL) BY MOUTH DAILY BEFORE BREAKFAST. AS DIRECTED (Patient taking differently: Take 1/2 tablet by mouth before breakfast on Sundays only.)  .  Omega-3 Fatty Acids (FISH OIL) 1000 MG CAPS Take 1,000 mg by mouth 2 (two) times daily.   . risedronate (ACTONEL) 35 MG tablet TAKE 1 TABLET BY MOUTH  EVERY 7 DAYS WITH WATER ON  EMPTY STOMACH, NOTHING BY  MOUTH OR LIE DOWN FOR NEXT  30 MINUTES.  [name] Kitchen triamterene-hydrochlorothiazide (MAXZIDE-25) 37.5-25 MG tablet Take 0.5 tablets by mouth daily.  . [DISCONTINUED] levothyroxine (SYNTHROID, LEVOTHROID) 100 MCG tablet Take 1 tablet (100 mcg total) by mouth daily.   No facility-administered encounter medications on file as of 12/03/2017.     Allergies (verified) Ciprofloxacin; Benicar [olmesartan medoxomil];  Clarithromycin; Sulfa antibiotics; Alendronate sodium; Celebrex [celecoxib]; and Penicillins   History: Past Medical History:  Diagnosis Date  . Arthritis   . Arthritis pain   . Benign hypertensive heart disease   . Breast cancer ([place])   . Cancer of right breast ([place]) 09/28/2011   IDC;  T2, N0 ([facility]+);   ER+;  Her-2 neg.,  Right PM,SLN  09/08/08   . Collagenous colitis   . Diverticulosis of colon   . External hemorrhoid   . Hearing loss   . Hypertension   . IBS (irritable bowel syndrome)   . Macular degeneration   . Macular degeneration of both eyes   . Neuropathy, peripheral   . Nonspecific abnormal electrocardiogram (ECG) (EKG)   . Osteoporosis   . Other and unspecified hyperlipidemia   . Palpitations   . Phlebitis and thrombophlebitis of unspecified site   . Prolapse of vaginal walls without mention of uterine prolapse   . Shingles   . Symptomatic menopausal or female climacteric states   . Unspecified hypothyroidism    Past Surgical History:  Procedure Laterality Date  . BACK SURGERY    . BREAST LUMPECTOMY     per medical history form dated 09/27/09.  [name] Kitchen CARPAL TUNNEL RELEASE     bilateral  . COLONOSCOPY  12/03/2002   Dr. Silvano Rusk  . NEUROPLASTY / TRANSPOSITION MEDIAN NERVE AT CARPAL TUNNEL BILATERAL    . TONSILLECTOMY AND ADENOIDECTOMY    . VAGINAL HYSTERECTOMY     Family History  Problem Relation Age of Onset  . Heart disease Mother        Heart failure per medical history form dated 09/27/09.  [name] Kitchen Heart disease Father        Heart attack per medical history form dated 09/27/09.  [name] Kitchen Heart attack Father   . Heart disease Brother        Heart failure per medical history form dated 09/27/09.  . Stroke Brother   . Stroke Brother   . Leukemia Brother   . Breast cancer Cousin   . Healthy Daughter   . Healthy Son   . Colon cancer Neg Hx    Social History   Socioeconomic History  . Marital status: Widowed    Spouse name: Not on file  . Number of children: 2  .  Years of education: 54  . Highest education level: 12th grade  Occupational History  . Occupation: Firefighter  Social Needs  . Financial resource strain: Not hard at all  . Food insecurity:    Worry: Never true    Inability: Never true  . Transportation needs:    Medical: No    Non-medical: No  Tobacco Use  . Smoking status: Never Smoker  . Smokeless tobacco: Never Used  Substance and Sexual Activity  . Alcohol use: No  . Drug use: No  . Sexual activity: Not Currently  Lifestyle  . Physical activity:    Days per week: Not on file    Minutes per session: Not on file  . Stress: Not on file  Relationships  . Social connections:    Talks on phone: Not on file    Gets together: Not on file    Attends religious service: Not on file    Active member of club or organization: Not on file    Attends meetings of clubs or organizations: Not on file    Relationship status: Not on file  Other Topics Concern  . 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 No alcohol or caffeine or tobacco.    Tobacco Counseling Counseling given: Not Answered   Clinical Intake:    Pain : 0-10 Pain Score: 3  Pain Location: Generalized(multiple joint pains due to arthritis) Pain Onset: More than a month ago Pain Frequency: Constant Effect of Pain on Daily Activities: moderate        How often do you need to have someone help you when you read instructions, pamphlets, or other written materials from your doctor or pharmacy?: 1 - Never What is the last grade level you completed in school?: 12  Interpreter Needed?: No  Information entered by :: Chong Sicilian, RN   Activities of Daily Living In your present state of health, do you have any difficulty performing the following activities: 12/03/2017  Hearing? Y  Comment Has a hearing aid and it works well  Vision? Y  Comment bilateral macular degeneration. Receives shots every 4 to 6  weeks and they have helped slow the progress.   Difficulty concentrating or making decisions? Y  Comment Has noticed some changes in short term memory. Some days are better than others.   Walking or climbing stairs? Y  Comment uses a walker for balance  Dressing or bathing? N  Doing errands, shopping? Y  Comment daughter drives her to appointments and helps with errands  Preparing Food and eating ? Y  Comment has some help with food preparation  Using the Toilet? N  In the past six months, have you accidently leaked urine? Y  Comment some urge incontinence  Do you have problems with loss of bowel control? N  Managing your Medications? N  Managing your Finances? N  Housekeeping or managing your Housekeeping? Y  Comment has some help daily from [facility] program  Some recent data might be hidden     Immunizations and Health Maintenance Immunization History  Administered Date(s) Administered  . Influenza Whole 04/07/2010  . Influenza, High Dose Seasonal PF 06/14/2016, 05/29/2017  . Influenza,inj,Quad PF,6+ Mos 06/04/2013, 05/22/2014, 05/31/2015  . Pneumococcal Conjugate-13 06/04/2013  . Pneumococcal Polysaccharide-23 08/07/1998  . Td 02/05/2007   There are no preventive care reminders to display for this patient.  Patient Care Team: Chipper Herb, MD as PCP - General (Family Medicine) Ralene Muskrat as Physician Assistant (Chiropractic Medicine)  No hospitalizations, ER visits, or surgeries this past year.      Assessment:   This is a routine wellness examination for Anahy.  Hearing/Vision screen No deficits noted during visit.   Dietary issues and exercise activities discussed: Current Exercise Habits: The patient does not participate in regular exercise at present, Exercise limited by: orthopedic condition(s);Other - see comments(age and balance)  Goals    . Exercise 150 min/wk Moderate Activity    . Prevent falls     Move carefully to avoid  falls. Continue to use  walker for balance.       Depression Screen PHQ 2/9 Scores 11/28/2017 07/24/2017 03/21/2017 11/29/2016 11/09/2016 08/03/2016 06/14/2016  PHQ - 2 Score 1 1 0 0 0 1 0  PHQ- 9 Score - - - - - - -    Fall Risk Fall Risk  11/28/2017 07/24/2017 03/21/2017 11/29/2016 11/09/2016  Falls in the past year? No No No Yes Yes  Number falls in past yr: - - - 1 1  Injury with Fall? - - - Yes Yes  Comment - - - - -  Risk Factor Category  - - - High Fall Risk -  Risk for fall due to : - - - History of fall(s);Impaired balance/gait;Impaired mobility -  Follow up - - - - -    Is the patient's home free of loose throw rugs in walkways, pet beds, electrical cords, etc?   yes      Grab bars in the bathroom? no      Handrails on the stairs?   yes      Adequate lighting?   yes   Cognitive Function: MMSE - Mini Mental State Exam 12/03/2017 11/29/2016 10/08/2015 11/04/2014  Orientation to time '5 5 5 5  ' Orientation to Place '5 5 5 5  ' Registration '3 3 3 3  ' Attention/ Calculation '4 4 5 5  ' Recall '3 2 3 1  ' Language- name 2 objects '2 2 2 2  ' Language- repeat '1 1 1 1  ' Language- follow 3 step command '3 3 3 3  ' Language- read & follow direction '1 1 1 1  ' Write a sentence '1 1 1 1  ' Copy design '1 1 1 1  ' Total score '29 28 30 ' -    normal exam    Screening Tests Health Maintenance  Topic Date Due  . TETANUS/TDAP  07/24/2018 (Originally 02/04/2017)  . INFLUENZA VACCINE  03/07/2018  . DEXA SCAN  11/10/2018  . PNA vac Low Risk Adult  Completed      Plan:   Keep f/u with PCP Reviewed recent labs and change in synthroid dose.  Order placed for repeat TSH in 6 weeks since her synthroid dose was changed today. Dexa scan ordered to be done in 6 weeks as well Continue to use walker and move carefully to avoid falls Increase activity level as tolerated. Chair exercise handout provided and discussed. Urinate every 3 hours to decrease urge incontinence  I have personally reviewed and noted the following in the patient's chart:    . Medical and social history . Use of alcohol, tobacco or illicit drugs  . Current medications and supplements . Functional ability and status . Nutritional status . Physical activity . Advanced directives . List of other physicians . Hospitalizations, surgeries, and ER visits in previous 12 months . Vitals . Screenings to include cognitive, depression, and falls . Referrals and appointments  In addition, I have reviewed and discussed with patient certain preventive protocols, quality metrics, and best practice recommendations. A written personalized care plan for preventive services as well as general preventive health recommendations were provided to patient.     Chong Sicilian, RN   12/03/2017   I have reviewed and agree with the above AWV documentation.   Mary-Margaret Hassell Done, FNP

## 2017-12-03 NOTE — Patient Instructions (Addendum)
Janice Reed , Thank you for taking time to come for your Medicare Wellness Visit. I appreciate your ongoing commitment to your health goals. Please review the following plan we discussed and let me know if I can assist you in the future.   These are the goals we discussed: Goals    . Exercise 150 min/wk Moderate Activity    . Prevent falls     Move carefully to avoid falls. Continue to use walker for balance.        This is a list of the screening recommended for you and due dates:  Health Maintenance  Topic Date Due  . Tetanus Vaccine  07/24/2018*  . Flu Shot  03/07/2018  . DEXA scan (bone density measurement)  11/10/2018  . Pneumonia vaccines  Completed  *Topic was postponed. The date shown is not the original due date.   Take 1/2 of peach levothyroxine pill on Sunday and 1 whole yellow levothyroxine every day. We will recheck this in 6 weeks.   Chair exercises daily. Refer to handouts.

## 2017-12-04 DIAGNOSIS — B351 Tinea unguium: Secondary | ICD-10-CM | POA: Diagnosis not present

## 2017-12-04 DIAGNOSIS — M79676 Pain in unspecified toe(s): Secondary | ICD-10-CM | POA: Diagnosis not present

## 2017-12-05 NOTE — Telephone Encounter (Signed)
Patient was taking 100mg  daily and an extra 12.5 mg on M, W, F. Advised to change to 100mg  and an extra 12.5 mg on Sunday only. Patient agreed and will repeat TSH in 6 weeks.

## 2017-12-13 DIAGNOSIS — M9904 Segmental and somatic dysfunction of sacral region: Secondary | ICD-10-CM | POA: Diagnosis not present

## 2017-12-13 DIAGNOSIS — M5137 Other intervertebral disc degeneration, lumbosacral region: Secondary | ICD-10-CM | POA: Diagnosis not present

## 2017-12-13 DIAGNOSIS — M9903 Segmental and somatic dysfunction of lumbar region: Secondary | ICD-10-CM | POA: Diagnosis not present

## 2017-12-13 DIAGNOSIS — M9905 Segmental and somatic dysfunction of pelvic region: Secondary | ICD-10-CM | POA: Diagnosis not present

## 2017-12-18 DIAGNOSIS — M1380 Other specified arthritis, unspecified site: Secondary | ICD-10-CM | POA: Diagnosis not present

## 2017-12-25 DIAGNOSIS — H353221 Exudative age-related macular degeneration, left eye, with active choroidal neovascularization: Secondary | ICD-10-CM | POA: Diagnosis not present

## 2017-12-25 DIAGNOSIS — H353211 Exudative age-related macular degeneration, right eye, with active choroidal neovascularization: Secondary | ICD-10-CM | POA: Diagnosis not present

## 2017-12-25 DIAGNOSIS — H43813 Vitreous degeneration, bilateral: Secondary | ICD-10-CM | POA: Diagnosis not present

## 2017-12-25 DIAGNOSIS — D3132 Benign neoplasm of left choroid: Secondary | ICD-10-CM | POA: Diagnosis not present

## 2017-12-26 ENCOUNTER — Telehealth: Payer: Self-pay | Admitting: Family Medicine

## 2017-12-26 NOTE — Telephone Encounter (Signed)
Pt aware of fish oil dose

## 2018-01-02 ENCOUNTER — Other Ambulatory Visit: Payer: Self-pay | Admitting: *Deleted

## 2018-01-02 MED ORDER — RISEDRONATE SODIUM 35 MG PO TABS: ORAL_TABLET | ORAL | 1 refills | Status: DC

## 2018-01-14 ENCOUNTER — Other Ambulatory Visit: Payer: Medicare Other

## 2018-01-14 ENCOUNTER — Ambulatory Visit: Payer: Medicare Other

## 2018-01-14 DIAGNOSIS — E039 Hypothyroidism, unspecified: Secondary | ICD-10-CM | POA: Diagnosis not present

## 2018-01-15 LAB — TSH: TSH: 1.38 u[IU]/mL (ref 0.450–4.500)

## 2018-01-17 DIAGNOSIS — M5137 Other intervertebral disc degeneration, lumbosacral region: Secondary | ICD-10-CM | POA: Diagnosis not present

## 2018-01-17 DIAGNOSIS — M9903 Segmental and somatic dysfunction of lumbar region: Secondary | ICD-10-CM | POA: Diagnosis not present

## 2018-01-17 DIAGNOSIS — M9905 Segmental and somatic dysfunction of pelvic region: Secondary | ICD-10-CM | POA: Diagnosis not present

## 2018-01-17 DIAGNOSIS — M9904 Segmental and somatic dysfunction of sacral region: Secondary | ICD-10-CM | POA: Diagnosis not present

## 2018-01-18 DIAGNOSIS — M1380 Other specified arthritis, unspecified site: Secondary | ICD-10-CM | POA: Diagnosis not present

## 2018-01-28 ENCOUNTER — Telehealth: Payer: Self-pay | Admitting: Family Medicine

## 2018-01-28 NOTE — Telephone Encounter (Signed)
Pt appt made

## 2018-01-29 DIAGNOSIS — H353211 Exudative age-related macular degeneration, right eye, with active choroidal neovascularization: Secondary | ICD-10-CM | POA: Diagnosis not present

## 2018-01-29 DIAGNOSIS — H353221 Exudative age-related macular degeneration, left eye, with active choroidal neovascularization: Secondary | ICD-10-CM | POA: Diagnosis not present

## 2018-02-14 DIAGNOSIS — M9904 Segmental and somatic dysfunction of sacral region: Secondary | ICD-10-CM | POA: Diagnosis not present

## 2018-02-14 DIAGNOSIS — M5137 Other intervertebral disc degeneration, lumbosacral region: Secondary | ICD-10-CM | POA: Diagnosis not present

## 2018-02-14 DIAGNOSIS — M9905 Segmental and somatic dysfunction of pelvic region: Secondary | ICD-10-CM | POA: Diagnosis not present

## 2018-02-14 DIAGNOSIS — M9903 Segmental and somatic dysfunction of lumbar region: Secondary | ICD-10-CM | POA: Diagnosis not present

## 2018-03-06 DIAGNOSIS — H353221 Exudative age-related macular degeneration, left eye, with active choroidal neovascularization: Secondary | ICD-10-CM | POA: Diagnosis not present

## 2018-03-06 DIAGNOSIS — H353211 Exudative age-related macular degeneration, right eye, with active choroidal neovascularization: Secondary | ICD-10-CM | POA: Diagnosis not present

## 2018-03-14 DIAGNOSIS — M5137 Other intervertebral disc degeneration, lumbosacral region: Secondary | ICD-10-CM | POA: Diagnosis not present

## 2018-03-14 DIAGNOSIS — M9905 Segmental and somatic dysfunction of pelvic region: Secondary | ICD-10-CM | POA: Diagnosis not present

## 2018-03-14 DIAGNOSIS — M9904 Segmental and somatic dysfunction of sacral region: Secondary | ICD-10-CM | POA: Diagnosis not present

## 2018-03-14 DIAGNOSIS — M9903 Segmental and somatic dysfunction of lumbar region: Secondary | ICD-10-CM | POA: Diagnosis not present

## 2018-03-28 ENCOUNTER — Ambulatory Visit (INDEPENDENT_AMBULATORY_CARE_PROVIDER_SITE_OTHER): Payer: Medicare Other

## 2018-03-28 ENCOUNTER — Ambulatory Visit (INDEPENDENT_AMBULATORY_CARE_PROVIDER_SITE_OTHER): Payer: Medicare Other | Admitting: Family Medicine

## 2018-03-28 ENCOUNTER — Encounter: Payer: Self-pay | Admitting: Family Medicine

## 2018-03-28 VITALS — BP 162/84 | HR 72 | Temp 97.0°F | Ht 60.0 in | Wt 111.0 lb

## 2018-03-28 DIAGNOSIS — I10 Essential (primary) hypertension: Secondary | ICD-10-CM

## 2018-03-28 DIAGNOSIS — E7800 Pure hypercholesterolemia, unspecified: Secondary | ICD-10-CM

## 2018-03-28 DIAGNOSIS — R2681 Unsteadiness on feet: Secondary | ICD-10-CM | POA: Diagnosis not present

## 2018-03-28 DIAGNOSIS — M25552 Pain in left hip: Secondary | ICD-10-CM

## 2018-03-28 DIAGNOSIS — E559 Vitamin D deficiency, unspecified: Secondary | ICD-10-CM | POA: Diagnosis not present

## 2018-03-28 DIAGNOSIS — M25562 Pain in left knee: Secondary | ICD-10-CM

## 2018-03-28 DIAGNOSIS — M25551 Pain in right hip: Secondary | ICD-10-CM

## 2018-03-28 DIAGNOSIS — M25561 Pain in right knee: Secondary | ICD-10-CM

## 2018-03-28 DIAGNOSIS — E039 Hypothyroidism, unspecified: Secondary | ICD-10-CM | POA: Diagnosis not present

## 2018-03-28 DIAGNOSIS — C50911 Malignant neoplasm of unspecified site of right female breast: Secondary | ICD-10-CM

## 2018-03-28 DIAGNOSIS — G8929 Other chronic pain: Secondary | ICD-10-CM

## 2018-03-28 DIAGNOSIS — R29898 Other symptoms and signs involving the musculoskeletal system: Secondary | ICD-10-CM

## 2018-03-28 DIAGNOSIS — Z9181 History of falling: Secondary | ICD-10-CM

## 2018-03-28 DIAGNOSIS — Z171 Estrogen receptor negative status [ER-]: Secondary | ICD-10-CM

## 2018-03-28 DIAGNOSIS — E785 Hyperlipidemia, unspecified: Secondary | ICD-10-CM | POA: Diagnosis not present

## 2018-03-28 NOTE — Progress Notes (Signed)
Subjective:    Patient ID: Janice Reed, female    DOB: [birth year], 84 y.o.   MRN: [id number]  HPI Pt here for follow up and management of chronic medical problems which includes hypertension, hypothyroid, and hyperlipidemia. She is taking medication regularly.  Patient continues to have ongoing problems with arthritis and constipation.  Her blood pressure is slightly elevated on the systolic side today.  She will get her has gotten a chest x-ray today will be given an FOBT to return and will get routine lab work today.  Patient is pleasant and alert.  She indicated that she appreciates her daughter's tight control of watching her and she has implemented the Program so someone comes in for about 5 hours a day Monday through Friday to assist her at home.  She is always been a private person.  She does not get out of the house a lot and would like to get out more.  The daughter is trying to protect her mother from any falls or any problems happening with her.  The patient understands this but at the same time would like to get out more.  She is appreciative of the lady that comes during the week and says that she is very nice.  This is stressful for her because of the privacy issues but she understands if she gets older she does need more assistance and supervision.  She is very alert this morning.  What she says makes sense and understand but also reminded her of her age and the fact that she is been doing better for the past couple of years.  Today she denies any chest pain.  She denies any shortness of breath anymore than usual.  She does have a tendency to have some loose bowel movements at times but not a lot of a bowel movement.  She has lost some weight since her last visit.  There is no blood in the stool or black tarry bowel movements.  She does not eat a lot.  I encouraged her to try to drink another can of Ensure each day and she said she thought she could do that.  She is passing her water without  burning or pain but does have some incontinence especially with arising from a sitting position.     Patient Active Problem List   Diagnosis Date Noted  . Benign hypertension with chronic kidney disease, stage III ([place]) 05/22/2014  . Age-related macular degeneration, wet, both eyes ([facility]) 03/12/2014  . Internal hemorrhoids without mention of complication 53/20/2334  . Immune fecal occult blood test + x 2 01/05/2012  . Cancer of right breast ([facility]) 09/28/2011  . Hearing loss 12/27/2010  . Arthritis 12/27/2010  . Hyperlipidemia   . Phlebitis and thrombophlebitis of unspecified site   . Symptomatic menopausal or female climacteric states   . Malaise and fatigue   . Palpitations   . Nonspecific abnormal electrocardiogram (ECG) (EKG)   . Hypothyroidism   . Osteoporosis   . IBS (irritable bowel syndrome)    Outpatient Encounter Medications as of 03/28/2018  Medication Sig  . aspirin 325 MG tablet Take 325 mg by mouth See admin instructions. Take one tablet a day and more if needed for pain - up to 3 times daily  . bevacizumab (AVASTIN) 1.25 mg/0.1 mL SOLN 1.25 mg by Intravitreal route.   . Calcium-Magnesium-Vitamin D (CITRACAL CALCIUM+D PO) Take 1 capsule by mouth 2 (two) times daily.    . Cholecalciferol (VITAMIN D3) 1000  UNITS CAPS Take 1,000 Units by mouth 2 (two) times daily.   [name] Kitchen levothyroxine (SYNTHROID, LEVOTHROID) 100 MCG tablet Take 1 tablet (100 mcg total) by mouth daily.  [name] Kitchen levothyroxine (SYNTHROID, LEVOTHROID) 25 MCG tablet TAKE 1 TABLET (25 MCG TOTAL) BY MOUTH DAILY BEFORE BREAKFAST. AS DIRECTED (Patient taking differently: Take 1/2 tablet by mouth before breakfast on Sundays only.)  . Omega-3 Fatty Acids (FISH OIL) 1000 MG CAPS Take 1,000 mg by mouth 2 (two) times daily.   . risedronate (ACTONEL) 35 MG tablet TAKE 1 TABLET BY MOUTH  EVERY 7 DAYS WITH WATER ON  EMPTY STOMACH, NOTHING BY  MOUTH OR LIE DOWN FOR NEXT  30 MINUTES.  [name] Kitchen triamterene-hydrochlorothiazide (MAXZIDE-25)  37.5-25 MG tablet Take 0.5 tablets by mouth daily.   No facility-administered encounter medications on file as of 03/28/2018.      Review of Systems  Constitutional: Negative.   HENT: Negative.   Eyes: Negative.   Respiratory: Negative.   Cardiovascular: Negative.   Gastrointestinal: Positive for constipation.  Endocrine: Negative.   Genitourinary: Negative.   Musculoskeletal: Positive for arthralgias.  Skin: Negative.   Allergic/Immunologic: Negative.   Neurological: Negative.   Hematological: Negative.   Psychiatric/Behavioral: Negative.        Objective:   Physical Exam  Constitutional: She is oriented to person, place, and time. She appears well-developed and well-nourished. No distress.  The patient is pleasant and alert and elderly and somewhat frustrated with having to be in the house using a walker all the time but she understands the importance of this and quite frankly has done very well with her current regimen without any further falls.  HENT:  Head: Normocephalic and atraumatic.  Right Ear: External ear normal.  Left Ear: External ear normal.  Nose: Nose normal.  Mouth/Throat: Oropharynx is clear and moist. No oropharyngeal exudate.  Both ear canals are clear of cerumen she wears a hearing aid in the right.  Eyes: Pupils are equal, round, and reactive to light. Conjunctivae and EOM are normal. Right eye exhibits no discharge. Left eye exhibits no discharge. No scleral icterus.  Neck: Normal range of motion. Neck supple. No thyromegaly present.  There is no thyromegaly or bruits.  No adenopathy.  Cardiovascular: Normal rate, regular rhythm, normal heart sounds and intact distal pulses.  No murmur heard. The heart is fairly regular at 72/min.  Pulmonary/Chest: Effort normal and breath sounds normal. She has no wheezes. She has no rales.  Clear anteriorly and posteriorly  Abdominal: Soft. Bowel sounds are normal. She exhibits no mass. There is no tenderness.  There  is no liver or spleen enlargement no epigastric tenderness no bruits and no masses.  Her kyphosis makes the abdominal space for checking her abdomen much smaller.  Musculoskeletal: Normal range of motion. She exhibits edema. She exhibits no tenderness.  1+ pretibial edema on the left and 2+ pedal edema.  This is a side many years ago she had phlebitis on.  She is wearing support hose and I encouraged her to continue to do this.  We also encouraged her to elevate her legs higher than the level of her heart a couple of times a day for 20 minutes.  The patient is definitely kyphotic and has gait instability.  She needs close supervision.  Lymphadenopathy:    She has no cervical adenopathy.  Neurological: She is alert and oriented to person, place, and time. She has normal reflexes. No cranial nerve deficit.  Skin: Skin is warm and dry. No  rash noted.  Psychiatric: She has a normal mood and affect. Her behavior is normal. Judgment and thought content normal.  The patient's mood affect and behavior are normal for her.  Nursing note and vitals reviewed.   BP (!) 162/84 (BP Location: Left Arm)   Pulse 72   Temp (!) 97 F (36.1 C) (Oral)   Ht 5' (1.524 m)   Wt 111 lb (50.3 kg)   BMI 21.68 kg/m        Assessment & Plan:  1. Hypothyroidism, unspecified type -Patient does have ongoing weakness but probably more secondary to her age and her inactivity.  We will check a thyroid today. - CBC with Differential/Platelet - Thyroid Panel With TSH  2. Benign essential HTN -The blood pressure is elevated on the systolic side.  We will have her take the Maxide 1/2 pill twice weekly on a Friday and Monday and she will check some home blood pressure readings.  I told her to be cautious with this because she must drink plenty of fluids and stay well-hydrated. - BMP8+EGFR - CBC with Differential/Platelet - Hepatic function panel - DG Chest 2 View; Future  3. Vitamin D deficiency -Continue current  treatment pending results of lab work - CBC with Differential/Platelet - VITAMIN D 25 Hydroxy (Vit-D Deficiency, Fractures)  4. Pure hypercholesterolemia -Continue with therapeutic lifestyle changes to control cholesterol as well as possible and with omega-3 fatty acids - CBC with Differential/Platelet - Lipid panel - DG Chest 2 View; Future  5. Gait instability -Use walker all the time continue to be careful not put yourself at risk for falling - CBC with Differential/Platelet  6. Weakness of both lower extremities -Use walker.  Exercise with assistance  7. At high risk for falls -Use walker ask for assistance when needed  8. Chronic arthralgias of knees and hips -Take Tylenol for pain  9. Malignant neoplasm of right breast in female, estrogen receptor negative, unspecified site of breast [facility]) -Follow-up with surgeon as needed  No orders of the defined types were placed in this encounter.  Patient Instructions                       Medicare Annual Wellness Visit  [facility] and the medical providers at [facility] strive to bring you the best medical care.  In doing so we not only want to address your current medical conditions and concerns but also to detect new conditions early and prevent illness, disease and health-related problems.    Medicare offers a yearly Wellness Visit which allows our clinical staff to assess your need for preventative services including immunizations, lifestyle education, counseling to decrease risk of preventable diseases and screening for fall risk and other medical concerns.    This visit is provided free of charge (no copay) for all Medicare recipients. The clinical pharmacists at [facility] have begun to conduct these Wellness Visits which will also include a thorough review of all your medications.    As you primary medical provider recommend that you make an appointment for your Annual Wellness  Visit if you have not done so already this year.  You may set up this appointment before you leave today or you may call back ([phone]) and schedule an appointment.  Please make sure when you call that you mention that you are scheduling your Annual Wellness Visit with the clinical pharmacist so that the appointment may be made for the proper length  of time.     Continue current medications. Continue good therapeutic lifestyle changes which include good diet and exercise. Fall precautions discussed with patient. If an FOBT was given today- please return it to our front desk. If you are over 25 years old - you may need Prevnar 3 or the adult Pneumonia vaccine.  **Flu shots are available--- please call and schedule a FLU-CLINIC appointment**  After your visit with [place] today you will receive a survey in the mail or online from [employer] regarding your care with [place]. Please take a moment to fill this out. Your feedback is very important to [place] as you can help [place] better understand your patient needs as well as improve your experience and satisfaction. WE CARE ABOUT YOU!!!   For the summer months, please try taking a half of a Maxide 2 days a week on Friday and Monday.  Also check blood pressures while taking the Maxide. Watch salt intake more closely Work it out with your daughter and your caregiver to try to get out of the house at least once a week possibly going to the grocery store and using the cart for support and walking in the grocery store for short periods of time just so you can get more exercise and are able to get out of the house.  Discussed this with your daughter and try to work something out. Increase Ensure to 2 cans daily. Drink more water Watch caffeine intake  Arrie Senate MD

## 2018-03-28 NOTE — Patient Instructions (Addendum)
Medicare Annual Wellness Visit  [facility] and the medical providers at [facility] strive to bring you the best medical care.  In doing so we not only want to address your current medical conditions and concerns but also to detect new conditions early and prevent illness, disease and health-related problems.    Medicare offers a yearly Wellness Visit which allows our clinical staff to assess your need for preventative services including immunizations, lifestyle education, counseling to decrease risk of preventable diseases and screening for fall risk and other medical concerns.    This visit is provided free of charge (no copay) for all Medicare recipients. The clinical pharmacists at [facility] have begun to conduct these Wellness Visits which will also include a thorough review of all your medications.    As you primary medical provider recommend that you make an appointment for your Annual Wellness Visit if you have not done so already this year.  You may set up this appointment before you leave today or you may call back ([phone]) and schedule an appointment.  Please make sure when you call that you mention that you are scheduling your Annual Wellness Visit with the clinical pharmacist so that the appointment may be made for the proper length of time.     Continue current medications. Continue good therapeutic lifestyle changes which include good diet and exercise. Fall precautions discussed with patient. If an FOBT was given today- please return it to our front desk. If you are over 68 years old - you may need Prevnar 12 or the adult Pneumonia vaccine.  **Flu shots are available--- please call and schedule a FLU-CLINIC appointment**  After your visit with [place] today you will receive a survey in the mail or online from [employer] regarding your care with [place]. Please take a moment to fill this out. Your feedback is very  important to [place] as you can help [place] better understand your patient needs as well as improve your experience and satisfaction. WE CARE ABOUT YOU!!!   For the summer months, please try taking a half of a Maxide 2 days a week on Friday and Monday.  Also check blood pressures while taking the Maxide. Watch salt intake more closely Work it out with your daughter and your caregiver to try to get out of the house at least once a week possibly going to the grocery store and using the cart for support and walking in the grocery store for short periods of time just so you can get more exercise and are able to get out of the house.  Discussed this with your daughter and try to work something out. Increase Ensure to 2 cans daily. Drink more water Watch caffeine intake

## 2018-03-29 LAB — CBC WITH DIFFERENTIAL/PLATELET
BASOS: 2 %
Basophils Absolute: 0.1 10*3/uL (ref 0.0–0.2)
EOS (ABSOLUTE): 0.2 10*3/uL (ref 0.0–0.4)
Eos: 3 %
HEMOGLOBIN: 15.1 g/dL (ref 11.1–15.9)
Hematocrit: 46.1 % (ref 34.0–46.6)
IMMATURE GRANS (ABS): 0 10*3/uL (ref 0.0–0.1)
Immature Granulocytes: 0 %
LYMPHS: 35 %
Lymphocytes Absolute: 1.7 10*3/uL (ref 0.7–3.1)
MCH: 30.8 pg (ref 26.6–33.0)
MCHC: 32.8 g/dL (ref 31.5–35.7)
MCV: 94 fL (ref 79–97)
MONOCYTES: 12 %
Monocytes Absolute: 0.6 10*3/uL (ref 0.1–0.9)
NEUTROS ABS: 2.3 10*3/uL (ref 1.4–7.0)
Neutrophils: 48 %
Platelets: 194 10*3/uL (ref 150–450)
RBC: 4.91 x10E6/uL (ref 3.77–5.28)
RDW: 14.4 % (ref 12.3–15.4)
WBC: 4.8 10*3/uL (ref 3.4–10.8)

## 2018-03-29 LAB — HEPATIC FUNCTION PANEL
ALK PHOS: 84 IU/L (ref 39–117)
ALT: 13 IU/L (ref 0–32)
AST: 19 IU/L (ref 0–40)
Albumin: 4.3 g/dL (ref 3.2–4.6)
Bilirubin Total: 0.4 mg/dL (ref 0.0–1.2)
Bilirubin, Direct: 0.12 mg/dL (ref 0.00–0.40)
TOTAL PROTEIN: 7 g/dL (ref 6.0–8.5)

## 2018-03-29 LAB — LIPID PANEL
Chol/HDL Ratio: 3.3 ratio (ref 0.0–4.4)
Cholesterol, Total: 240 mg/dL — ABNORMAL HIGH (ref 100–199)
HDL: 73 mg/dL (ref >39)
LDL CALC: 150 mg/dL — AB (ref 0–99)
Triglycerides: 87 mg/dL (ref 0–149)
VLDL CHOLESTEROL CAL: 17 mg/dL (ref 5–40)

## 2018-03-29 LAB — THYROID PANEL WITH TSH
Free Thyroxine Index: 3.6 (ref 1.2–4.9)
T3 UPTAKE RATIO: 30 % (ref 24–39)
T4 TOTAL: 12.1 ug/dL — AB (ref 4.5–12.0)
TSH: 1.2 u[IU]/mL (ref 0.450–4.500)

## 2018-03-29 LAB — BMP8+EGFR
BUN/Creatinine Ratio: 19 (ref 12–28)
BUN: 19 mg/dL (ref 10–36)
CALCIUM: 10 mg/dL (ref 8.7–10.3)
CO2: 24 mmol/L (ref 20–29)
CREATININE: 1 mg/dL (ref 0.57–1.00)
Chloride: 99 mmol/L (ref 96–106)
GFR, EST AFRICAN AMERICAN: 56 mL/min/{1.73_m2} — AB (ref >59)
GFR, EST NON AFRICAN AMERICAN: 48 mL/min/{1.73_m2} — AB (ref >59)
Glucose: 82 mg/dL (ref 65–99)
Potassium: 4 mmol/L (ref 3.5–5.2)
Sodium: 142 mmol/L (ref 134–144)

## 2018-03-29 LAB — VITAMIN D 25 HYDROXY (VIT D DEFICIENCY, FRACTURES): Vit D, 25-Hydroxy: 51.3 ng/mL (ref 30.0–100.0)

## 2018-04-09 DIAGNOSIS — M1380 Other specified arthritis, unspecified site: Secondary | ICD-10-CM | POA: Diagnosis not present

## 2018-04-16 ENCOUNTER — Other Ambulatory Visit: Payer: Self-pay | Admitting: Family Medicine

## 2018-04-17 DIAGNOSIS — H353221 Exudative age-related macular degeneration, left eye, with active choroidal neovascularization: Secondary | ICD-10-CM | POA: Diagnosis not present

## 2018-04-17 DIAGNOSIS — H353211 Exudative age-related macular degeneration, right eye, with active choroidal neovascularization: Secondary | ICD-10-CM | POA: Diagnosis not present

## 2018-04-18 DIAGNOSIS — M5137 Other intervertebral disc degeneration, lumbosacral region: Secondary | ICD-10-CM | POA: Diagnosis not present

## 2018-04-18 DIAGNOSIS — M9904 Segmental and somatic dysfunction of sacral region: Secondary | ICD-10-CM | POA: Diagnosis not present

## 2018-04-18 DIAGNOSIS — M9903 Segmental and somatic dysfunction of lumbar region: Secondary | ICD-10-CM | POA: Diagnosis not present

## 2018-04-18 DIAGNOSIS — M9905 Segmental and somatic dysfunction of pelvic region: Secondary | ICD-10-CM | POA: Diagnosis not present

## 2018-05-09 DIAGNOSIS — M1380 Other specified arthritis, unspecified site: Secondary | ICD-10-CM | POA: Diagnosis not present

## 2018-05-16 DIAGNOSIS — M9905 Segmental and somatic dysfunction of pelvic region: Secondary | ICD-10-CM | POA: Diagnosis not present

## 2018-05-16 DIAGNOSIS — M5137 Other intervertebral disc degeneration, lumbosacral region: Secondary | ICD-10-CM | POA: Diagnosis not present

## 2018-05-16 DIAGNOSIS — M9903 Segmental and somatic dysfunction of lumbar region: Secondary | ICD-10-CM | POA: Diagnosis not present

## 2018-05-16 DIAGNOSIS — M9904 Segmental and somatic dysfunction of sacral region: Secondary | ICD-10-CM | POA: Diagnosis not present

## 2018-05-17 ENCOUNTER — Other Ambulatory Visit: Payer: Self-pay | Admitting: Family Medicine

## 2018-05-20 ENCOUNTER — Ambulatory Visit (INDEPENDENT_AMBULATORY_CARE_PROVIDER_SITE_OTHER): Payer: Medicare Other

## 2018-05-20 DIAGNOSIS — Z23 Encounter for immunization: Secondary | ICD-10-CM | POA: Diagnosis not present

## 2018-05-29 DIAGNOSIS — D3132 Benign neoplasm of left choroid: Secondary | ICD-10-CM | POA: Diagnosis not present

## 2018-05-29 DIAGNOSIS — H43813 Vitreous degeneration, bilateral: Secondary | ICD-10-CM | POA: Diagnosis not present

## 2018-05-29 DIAGNOSIS — H353211 Exudative age-related macular degeneration, right eye, with active choroidal neovascularization: Secondary | ICD-10-CM | POA: Diagnosis not present

## 2018-05-29 DIAGNOSIS — H353221 Exudative age-related macular degeneration, left eye, with active choroidal neovascularization: Secondary | ICD-10-CM | POA: Diagnosis not present

## 2018-06-09 DIAGNOSIS — M1380 Other specified arthritis, unspecified site: Secondary | ICD-10-CM | POA: Diagnosis not present

## 2018-07-02 DIAGNOSIS — H353221 Exudative age-related macular degeneration, left eye, with active choroidal neovascularization: Secondary | ICD-10-CM | POA: Diagnosis not present

## 2018-07-02 DIAGNOSIS — H353211 Exudative age-related macular degeneration, right eye, with active choroidal neovascularization: Secondary | ICD-10-CM | POA: Diagnosis not present

## 2018-07-18 DIAGNOSIS — M5137 Other intervertebral disc degeneration, lumbosacral region: Secondary | ICD-10-CM | POA: Diagnosis not present

## 2018-07-18 DIAGNOSIS — M9905 Segmental and somatic dysfunction of pelvic region: Secondary | ICD-10-CM | POA: Diagnosis not present

## 2018-07-18 DIAGNOSIS — M9903 Segmental and somatic dysfunction of lumbar region: Secondary | ICD-10-CM | POA: Diagnosis not present

## 2018-07-18 DIAGNOSIS — M9904 Segmental and somatic dysfunction of sacral region: Secondary | ICD-10-CM | POA: Diagnosis not present

## 2018-08-05 DIAGNOSIS — H43813 Vitreous degeneration, bilateral: Secondary | ICD-10-CM | POA: Diagnosis not present

## 2018-08-05 DIAGNOSIS — H353231 Exudative age-related macular degeneration, bilateral, with active choroidal neovascularization: Secondary | ICD-10-CM | POA: Diagnosis not present

## 2018-08-09 DIAGNOSIS — M1380 Other specified arthritis, unspecified site: Secondary | ICD-10-CM | POA: Diagnosis not present

## 2018-08-12 ENCOUNTER — Other Ambulatory Visit: Payer: Self-pay | Admitting: Family Medicine

## 2018-08-13 NOTE — Telephone Encounter (Signed)
Ov 08/20/18

## 2018-08-20 ENCOUNTER — Ambulatory Visit (INDEPENDENT_AMBULATORY_CARE_PROVIDER_SITE_OTHER): Payer: Medicare Other | Admitting: Family Medicine

## 2018-08-20 ENCOUNTER — Encounter: Payer: Self-pay | Admitting: Family Medicine

## 2018-08-20 VITALS — BP 162/84 | HR 80 | Temp 97.5°F | Ht 60.0 in | Wt 111.0 lb

## 2018-08-20 DIAGNOSIS — E559 Vitamin D deficiency, unspecified: Secondary | ICD-10-CM | POA: Diagnosis not present

## 2018-08-20 DIAGNOSIS — E7800 Pure hypercholesterolemia, unspecified: Secondary | ICD-10-CM | POA: Diagnosis not present

## 2018-08-20 DIAGNOSIS — M25561 Pain in right knee: Secondary | ICD-10-CM

## 2018-08-20 DIAGNOSIS — M25562 Pain in left knee: Secondary | ICD-10-CM

## 2018-08-20 DIAGNOSIS — C50911 Malignant neoplasm of unspecified site of right female breast: Secondary | ICD-10-CM

## 2018-08-20 DIAGNOSIS — R2681 Unsteadiness on feet: Secondary | ICD-10-CM

## 2018-08-20 DIAGNOSIS — R29898 Other symptoms and signs involving the musculoskeletal system: Secondary | ICD-10-CM

## 2018-08-20 DIAGNOSIS — I10 Essential (primary) hypertension: Secondary | ICD-10-CM | POA: Diagnosis not present

## 2018-08-20 DIAGNOSIS — L989 Disorder of the skin and subcutaneous tissue, unspecified: Secondary | ICD-10-CM

## 2018-08-20 DIAGNOSIS — G8929 Other chronic pain: Secondary | ICD-10-CM

## 2018-08-20 DIAGNOSIS — Z171 Estrogen receptor negative status [ER-]: Secondary | ICD-10-CM

## 2018-08-20 DIAGNOSIS — M25551 Pain in right hip: Secondary | ICD-10-CM

## 2018-08-20 DIAGNOSIS — M25552 Pain in left hip: Secondary | ICD-10-CM

## 2018-08-20 DIAGNOSIS — E039 Hypothyroidism, unspecified: Secondary | ICD-10-CM | POA: Diagnosis not present

## 2018-08-20 NOTE — Progress Notes (Signed)
Subjective:    Patient ID: Janice Reed, female    DOB: [birth year], 85 y.o.   MRN: [id number]  HPI Pt here for follow up and management of chronic medical problems which includes hyperlipidemia and hypertension. She is taking medication regularly.  Janice Reed is an elderly patient that I have been following for years.  She has had several falls in the past requiring her to be more home confined and she uses her walker regularly.  She is concerned about 2 small skin lesions on the face her ongoing arthralgias red spots on the ankles with some edema and congestion.  She will be given an FOBT to return and will get lab work today.  Her initial blood pressure reading was elevated this morning.  She is currently taking Maxide 25 daily and on thyroid replacement.  She has a history of breast cancer arthritis hyperlipidemia hypertension osteoporosis and hypothyroidism.  Patient is pleasant and alert and elderly but working on 8 cylinders still.  We did review the skin lesions on her face and talked about the arthralgias and the edema she has in her ankles and feet.  She has had some throat congestion and we encouraged her to drink more water and stay well-hydrated.  She denies any chest pain or shortness of breath anymore than usual.  She is not had any falls since 2017 and is more acclimated to using her walker and her daughter gave her a pedal cycle to put on the floor to pedal with and to use her arms with and she has been using this to help keep her strength better.  She denies any trouble with nausea or vomiting blood in the stool or black tarry bowel movements.  She does associate she thinks taking more Ensure causing more loose bowel movements and she will cut back a little bit on the insurance if this helps this.  She denies any trouble with passing her water.     Patient Active Problem List   Diagnosis Date Noted  . Benign hypertension with chronic kidney disease, stage III ([place]) 05/22/2014  .  Age-related macular degeneration, wet, both eyes ([place]) 03/12/2014  . Internal hemorrhoids without mention of complication 19/50/9326  . Immune fecal occult blood test + x 2 01/05/2012  . Cancer of right breast ([place]) 09/28/2011  . Hearing loss 12/27/2010  . Arthritis 12/27/2010  . Hyperlipidemia   . Phlebitis and thrombophlebitis of unspecified site   . Symptomatic menopausal or female climacteric states   . Malaise and fatigue   . Palpitations   . Nonspecific abnormal electrocardiogram (ECG) (EKG)   . Hypothyroidism   . Osteoporosis   . IBS (irritable bowel syndrome)    Outpatient Encounter Medications as of 08/20/2018  Medication Sig  . aspirin 325 MG tablet Take 325 mg by mouth See admin instructions. Take one tablet a day and more if needed for pain - up to 3 times daily  . bevacizumab (AVASTIN) 1.25 mg/0.1 mL SOLN 1.25 mg by Intravitreal route.   . Calcium-Magnesium-Vitamin D (CITRACAL CALCIUM+D PO) Take 1 capsule by mouth 2 (two) times daily.    . Cholecalciferol (VITAMIN D3) 1000 UNITS CAPS Take 1,000 Units by mouth 2 (two) times daily.   [name] Kitchen levothyroxine (SYNTHROID, LEVOTHROID) 100 MCG tablet TAKE 1 TABLET BY MOUTH  DAILY  . Omega-3 Fatty Acids (FISH OIL) 1000 MG CAPS Take 1,000 mg by mouth 2 (two) times daily.   . risedronate (ACTONEL) 35 MG tablet TAKE 1 TABLET BY  MOUTH  EVERY 7 DAYS WITH WATER ON  EMPTY STOMACH, NOTHING BY  MOUTH OR LIE DOWN FOR NEXT  30 MINUTES.  [name] Kitchen triamterene-hydrochlorothiazide (MAXZIDE-25) 37.5-25 MG tablet Take 0.5 tablets by mouth daily.   No facility-administered encounter medications on file as of 08/20/2018.      Review of Systems  Constitutional: Negative.   HENT: Positive for congestion.   Eyes: Negative.   Respiratory: Negative.   Cardiovascular: Negative.  Leg swelling: some.  Gastrointestinal: Negative.   Endocrine: Negative.   Genitourinary: Negative.   Musculoskeletal: Positive for arthralgias.  Skin: Positive for color change (red spots  arouns ankles).       2 small lesions on face   Allergic/Immunologic: Negative.   Neurological: Negative.   Hematological: Negative.   Psychiatric/Behavioral: Negative.        Objective:   Physical Exam Vitals signs and nursing note reviewed.  Constitutional:      Appearance: Normal appearance. She is well-developed. She is not ill-appearing.     Comments: The patient is elderly but pleasant and alert.  HENT:     Head: Normocephalic and atraumatic.     Right Ear: Tympanic membrane, ear canal and external ear normal. There is no impacted cerumen.     Left Ear: Tympanic membrane, ear canal and external ear normal. There is no impacted cerumen.     Ears:     Comments: Hearing aid right ear    Nose: Nose normal.     Mouth/Throat:     Mouth: Mucous membranes are moist.     Pharynx: Oropharynx is clear. No oropharyngeal exudate or posterior oropharyngeal erythema.  Eyes:     General: No scleral icterus.       Right eye: No discharge.        Left eye: No discharge.     Extraocular Movements: Extraocular movements intact.     Conjunctiva/sclera: Conjunctivae normal.     Pupils: Pupils are equal, round, and reactive to light.  Neck:     Musculoskeletal: Normal range of motion and neck supple. No muscular tenderness.     Thyroid: No thyromegaly.     Vascular: No carotid bruit or JVD.     Comments: No carotid bruits noted on exam.  No thyroid enlargement and no anterior cervical adenopathy Cardiovascular:     Rate and Rhythm: Normal rate and regular rhythm.     Heart sounds: Normal heart sounds. No murmur. No gallop.      Comments: Heart is regular at 72/min.  Patient does have some pedal and pretibial edema bilaterally left greater than right and she did not wear support hose this morning.  She indicates that typically this edema seems to be well controlled with the support hose. Pulmonary:     Effort: Pulmonary effort is normal. No respiratory distress.     Breath sounds: Normal  breath sounds. No wheezing or rales.  Abdominal:     General: Abdomen is flat. Bowel sounds are normal.     Palpations: Abdomen is soft. There is no mass.     Tenderness: There is no abdominal tenderness.  Musculoskeletal:        General: No tenderness.     Right lower leg: Edema present.     Left lower leg: Edema present.     Comments: Patient uses a rolling walker for support with walking.  Lymphadenopathy:     Cervical: No cervical adenopathy.  Skin:    General: Skin is warm and dry.  Findings: Lesion present. No rash.     Comments: 2 skin lesions 1 on the forehead and one beneath the iPad on her glasses on the right side of her nose.  The one on the forehead was frozen and appeared to be an early actinic keratosis.  The other 1 may be an actinic keratosis but because of its location we will set her up an appointment to see her dermatologist, Dr. Denna Haggard.  Neurological:     General: No focal deficit present.     Mental Status: She is alert and oriented to person, place, and time. Mental status is at baseline.     Cranial Nerves: No cranial nerve deficit.     Gait: Gait normal.     Deep Tendon Reflexes: Reflexes are normal and symmetric.  Psychiatric:        Mood and Affect: Mood normal.        Behavior: Behavior normal.        Thought Content: Thought content normal.        Judgment: Judgment normal.     Comments: Mood affect and behavior appear to be normal for this patient.    BP (!) 162/84 (BP Location: Left Arm)   Pulse 80   Temp (!) 97.5 F (36.4 C) (Oral)   Ht 5' (1.524 m)   Wt 111 lb (50.3 kg)   BMI 21.68 kg/m         Assessment & Plan:  1. Benign essential HTN -The systolic blood pressure is slightly elevated this morning.  We will asked the patient to check some blood pressure readings from home and have her daughter dropped these readings by for review in the next 2 to 3 weeks. - BMP8+EGFR - CBC with Differential/Platelet - Hepatic function panel  2.  Hypothyroidism, unspecified type -Continue current treatment pending results of lab work - BMP8+EGFR - CBC with Differential/Platelet - Thyroid Panel With TSH  3. Vitamin D deficiency -Continue vitamin D replacement pending results of lab work - BMP8+EGFR - CBC with Differential/Platelet - VITAMIN D 25 Hydroxy (Vit-D Deficiency, Fractures)  4. Pure hypercholesterolemia -Continue with omega-3 fatty acids and healthy eating habits pending results of lab work - BMP8+EGFR - CBC with Differential/Platelet - Lipid panel  5. Malignant neoplasm of right breast in female, estrogen receptor negative, unspecified site of breast ([place]) -Continue to check breast regularly for any additional lumps or masses.  6. Chronic arthralgias of knees and hips -Take Tylenol for arthralgias and joint pain and continue to use walker so as not to fall  7. Weakness of both lower extremities -Use walker and exercise as much as possible with supervision  8. Gait instability -Use walker and exercise lower extremity so as not to fall  Patient Instructions                       Medicare Annual Wellness Visit  [facility] and the medical providers at [facility] strive to bring you the best medical care.  In doing so we not only want to address your current medical conditions and concerns but also to detect new conditions early and prevent illness, disease and health-related problems.    Medicare offers a yearly Wellness Visit which allows our clinical staff to assess your need for preventative services including immunizations, lifestyle education, counseling to decrease risk of preventable diseases and screening for fall risk and other medical concerns.    This visit is provided free of charge (  no copay) for all Medicare recipients. The clinical pharmacists at [facility] have begun to conduct these Wellness Visits which will also include a thorough review of all your  medications.    As you primary medical provider recommend that you make an appointment for your Annual Wellness Visit if you have not done so already this year.  You may set up this appointment before you leave today or you may call back ([phone]) and schedule an appointment.  Please make sure when you call that you mention that you are scheduling your Annual Wellness Visit with the clinical pharmacist so that the appointment may be made for the proper length of time.     Continue current medications. Continue good therapeutic lifestyle changes which include good diet and exercise. Fall precautions discussed with patient. If an FOBT was given today- please return it to our front desk. If you are over 15 years old - you may need Prevnar 12 or the adult Pneumonia vaccine.  **Flu shots are available--- please call and schedule a FLU-CLINIC appointment**  After your visit with [place] today you will receive a survey in the mail or online from [employer] regarding your care with [place]. Please take a moment to fill this out. Your feedback is very important to [place] as you can help [place] better understand your patient needs as well as improve your experience and satisfaction. WE CARE ABOUT YOU!!!   Try to move around more in the house and use the pedal cycle that [name] gave to you with your hands and arms and legs Continue to drink plenty of water and stay well-hydrated Continue to be careful and do not put yourself at risk for falling Continue to practice good hand hygiene during this flu season Try doing 1 can of Ensure in the morning and a half a can in the evening and keep tabs on the bowel movements with cutting back slightly. We will arrange for you to have an appointment the same day with Dr. Denna Haggard that you go to see the ophthalmologist.  Please have your daughter work that out with my nurse. Because of the elevated blood pressure, please check some blood pressure readings at home and return those readings  to [place] in a couple weeks for review .Drink plenty of water and fluids Use nasal saline 3-4 times daily each nostril Remember when you check your blood pressures at home to document whether you took the Lasix that day or not because that may be affecting some of the higher blood pressure readings. Continue to wear support hose and put these on the first thing with arising in the morning Continue to follow-up with ophthalmologist Use a small amount of cortisone 10 on a Q-tip to 3 times weekly into each ear canal as this may help some of the itching. Continue with pedal cycle for both upper and lower extremities   Arrie Senate MD

## 2018-08-20 NOTE — Addendum Note (Signed)
Addended by: Zannie Cove on: 08/20/2018 11:29 AM   Modules accepted: Orders

## 2018-08-20 NOTE — Patient Instructions (Addendum)
Medicare Annual Wellness Visit  [facility] and the medical providers at [facility] strive to bring you the best medical care.  In doing so we not only want to address your current medical conditions and concerns but also to detect new conditions early and prevent illness, disease and health-related problems.    Medicare offers a yearly Wellness Visit which allows our clinical staff to assess your need for preventative services including immunizations, lifestyle education, counseling to decrease risk of preventable diseases and screening for fall risk and other medical concerns.    This visit is provided free of charge (no copay) for all Medicare recipients. The clinical pharmacists at [facility] have begun to conduct these Wellness Visits which will also include a thorough review of all your medications.    As you primary medical provider recommend that you make an appointment for your Annual Wellness Visit if you have not done so already this year.  You may set up this appointment before you leave today or you may call back ([phone]) and schedule an appointment.  Please make sure when you call that you mention that you are scheduling your Annual Wellness Visit with the clinical pharmacist so that the appointment may be made for the proper length of time.     Continue current medications. Continue good therapeutic lifestyle changes which include good diet and exercise. Fall precautions discussed with patient. If an FOBT was given today- please return it to our front desk. If you are over 85 years old - you may need Prevnar 9 or the adult Pneumonia vaccine.  **Flu shots are available--- please call and schedule a FLU-CLINIC appointment**  After your visit with [place] today you will receive a survey in the mail or online from [employer] regarding your care with [place]. Please take a moment to fill this out. Your feedback is very  important to [place] as you can help [place] better understand your patient needs as well as improve your experience and satisfaction. WE CARE ABOUT YOU!!!   Try to move around more in the house and use the pedal cycle that [name] gave to you with your hands and arms and legs Continue to drink plenty of water and stay well-hydrated Continue to be careful and do not put yourself at risk for falling Continue to practice good hand hygiene during this flu season Try doing 1 can of Ensure in the morning and a half a can in the evening and keep tabs on the bowel movements with cutting back slightly. We will arrange for you to have an appointment the same day with Dr. Denna Haggard that you go to see the ophthalmologist.  Please have your daughter work that out with my nurse. Because of the elevated blood pressure, please check some blood pressure readings at home and return those readings to [place] in a couple weeks for review .Drink plenty of water and fluids Use nasal saline 3-4 times daily each nostril Remember when you check your blood pressures at home to document whether you took the Lasix that day or not because that may be affecting some of the higher blood pressure readings. Continue to wear support hose and put these on the first thing with arising in the morning Continue to follow-up with ophthalmologist Use a small amount of cortisone 10 on a Q-tip to 3 times weekly into each ear canal as this may help some of the itching. Continue with pedal cycle for both upper and lower extremities

## 2018-08-21 LAB — THYROID PANEL WITH TSH
Free Thyroxine Index: 2.2 (ref 1.2–4.9)
T3 Uptake Ratio: 24 % (ref 24–39)
T4, Total: 9 ug/dL (ref 4.5–12.0)
TSH: 3.93 u[IU]/mL (ref 0.450–4.500)

## 2018-08-21 LAB — LIPID PANEL
Chol/HDL Ratio: 3.2 ratio (ref 0.0–4.4)
Cholesterol, Total: 262 mg/dL — ABNORMAL HIGH (ref 100–199)
HDL: 81 mg/dL (ref >39)
LDL Calculated: 164 mg/dL — ABNORMAL HIGH (ref 0–99)
TRIGLYCERIDES: 83 mg/dL (ref 0–149)
VLDL CHOLESTEROL CAL: 17 mg/dL (ref 5–40)

## 2018-08-21 LAB — CBC WITH DIFFERENTIAL/PLATELET
Basophils Absolute: 0.1 10*3/uL (ref 0.0–0.2)
Basos: 2 %
EOS (ABSOLUTE): 0.2 10*3/uL (ref 0.0–0.4)
EOS: 3 %
Hematocrit: 44.5 % (ref 34.0–46.6)
Hemoglobin: 15 g/dL (ref 11.1–15.9)
IMMATURE GRANS (ABS): 0 10*3/uL (ref 0.0–0.1)
IMMATURE GRANULOCYTES: 0 %
LYMPHS: 26 %
Lymphocytes Absolute: 1.5 10*3/uL (ref 0.7–3.1)
MCH: 31.4 pg (ref 26.6–33.0)
MCHC: 33.7 g/dL (ref 31.5–35.7)
MCV: 93 fL (ref 79–97)
Monocytes Absolute: 0.6 10*3/uL (ref 0.1–0.9)
Monocytes: 10 %
NEUTROS PCT: 59 %
Neutrophils Absolute: 3.3 10*3/uL (ref 1.4–7.0)
PLATELETS: 213 10*3/uL (ref 150–450)
RBC: 4.78 x10E6/uL (ref 3.77–5.28)
RDW: 12.8 % (ref 11.7–15.4)
WBC: 5.6 10*3/uL (ref 3.4–10.8)

## 2018-08-21 LAB — BMP8+EGFR
BUN/Creatinine Ratio: 23 (ref 12–28)
BUN: 22 mg/dL (ref 10–36)
CALCIUM: 10.1 mg/dL (ref 8.7–10.3)
CO2: 26 mmol/L (ref 20–29)
CREATININE: 0.95 mg/dL (ref 0.57–1.00)
Chloride: 97 mmol/L (ref 96–106)
GFR calc Af Amer: 59 mL/min/{1.73_m2} — ABNORMAL LOW (ref >59)
GFR, EST NON AFRICAN AMERICAN: 51 mL/min/{1.73_m2} — AB (ref >59)
Glucose: 87 mg/dL (ref 65–99)
Potassium: 4 mmol/L (ref 3.5–5.2)
Sodium: 141 mmol/L (ref 134–144)

## 2018-08-21 LAB — HEPATIC FUNCTION PANEL
ALT: 18 IU/L (ref 0–32)
AST: 22 IU/L (ref 0–40)
Albumin: 4.3 g/dL (ref 3.2–4.6)
Alkaline Phosphatase: 92 IU/L (ref 39–117)
BILIRUBIN TOTAL: 0.4 mg/dL (ref 0.0–1.2)
BILIRUBIN, DIRECT: 0.13 mg/dL (ref 0.00–0.40)
Total Protein: 6.8 g/dL (ref 6.0–8.5)

## 2018-08-21 LAB — VITAMIN D 25 HYDROXY (VIT D DEFICIENCY, FRACTURES): VIT D 25 HYDROXY: 44.7 ng/mL (ref 30.0–100.0)

## 2018-09-10 ENCOUNTER — Other Ambulatory Visit: Payer: Self-pay | Admitting: Dermatology

## 2018-09-10 DIAGNOSIS — D485 Neoplasm of uncertain behavior of skin: Secondary | ICD-10-CM | POA: Diagnosis not present

## 2018-09-10 DIAGNOSIS — L570 Actinic keratosis: Secondary | ICD-10-CM | POA: Diagnosis not present

## 2018-09-10 DIAGNOSIS — D229 Melanocytic nevi, unspecified: Secondary | ICD-10-CM | POA: Diagnosis not present

## 2018-09-10 DIAGNOSIS — R202 Paresthesia of skin: Secondary | ICD-10-CM | POA: Diagnosis not present

## 2018-09-13 DIAGNOSIS — H43813 Vitreous degeneration, bilateral: Secondary | ICD-10-CM | POA: Diagnosis not present

## 2018-09-13 DIAGNOSIS — H353231 Exudative age-related macular degeneration, bilateral, with active choroidal neovascularization: Secondary | ICD-10-CM | POA: Diagnosis not present

## 2018-10-01 ENCOUNTER — Other Ambulatory Visit: Payer: Self-pay | Admitting: Family Medicine

## 2018-10-02 ENCOUNTER — Other Ambulatory Visit: Payer: Self-pay | Admitting: *Deleted

## 2018-10-02 MED ORDER — TRIAMTERENE-HCTZ 37.5-25 MG PO TABS: 0.5000 | ORAL_TABLET | Freq: Every day | ORAL | 1 refills | Status: DC

## 2018-10-03 DIAGNOSIS — M79676 Pain in unspecified toe(s): Secondary | ICD-10-CM | POA: Diagnosis not present

## 2018-10-03 DIAGNOSIS — I70203 Unspecified atherosclerosis of native arteries of extremities, bilateral legs: Secondary | ICD-10-CM | POA: Diagnosis not present

## 2018-10-03 DIAGNOSIS — L84 Corns and callosities: Secondary | ICD-10-CM | POA: Diagnosis not present

## 2018-10-03 DIAGNOSIS — B351 Tinea unguium: Secondary | ICD-10-CM | POA: Diagnosis not present

## 2018-11-08 DIAGNOSIS — H353231 Exudative age-related macular degeneration, bilateral, with active choroidal neovascularization: Secondary | ICD-10-CM | POA: Diagnosis not present

## 2018-12-10 ENCOUNTER — Ambulatory Visit (INDEPENDENT_AMBULATORY_CARE_PROVIDER_SITE_OTHER): Payer: Medicare Other | Admitting: *Deleted

## 2018-12-10 ENCOUNTER — Other Ambulatory Visit: Payer: Self-pay

## 2018-12-10 ENCOUNTER — Encounter: Payer: Self-pay | Admitting: *Deleted

## 2018-12-10 DIAGNOSIS — Z0000 Encounter for general adult medical examination without abnormal findings: Secondary | ICD-10-CM

## 2018-12-10 NOTE — Progress Notes (Addendum)
MEDICARE ANNUAL WELLNESS VISIT  12/10/2018  Telephone Visit Disclaimer This Medicare AWV was conducted by telephone due to national recommendations for restrictions regarding the COVID-19 Pandemic (e.g. social distancing).  I verified, using two identifiers, that I am speaking with Janice Reed or their authorized healthcare agent. I discussed the limitations, risks, security, and privacy concerns of performing an evaluation and management service by telephone and the potential availability of an in-person appointment in the future. The patient expressed understanding and agreed to proceed.   Subjective:  Janice Reed is a 85 y.o. female patient of Chipper Herb, MD who had a Medicare Annual Wellness Visit today via telephone. 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  She has 2 children, 4 grandchildren, and 5 great grandchildren.  Her son lives in [place] and her daughter lives next door to her.  Patient has a nurses aid that stays with her from 11:30 am - 4:30 pm daily.  She helps her with household chores and cooking.  She also sees her daughter most days and enjoyed going to lunch with friends before the [place] 19 pandemic.  she reports that she is socially active and does interact with friends/family regularly.  She hopes to return to socializing with friends and family more often once the COVID 19 restrictions are lifted. she is minimally physically active and enjoys reading.   Patient Care Team: Chipper Herb, MD as PCP - General (Family Medicine) Ralene Muskrat as Physician Assistant (Chiropractic Medicine) Sherlynn Stalls, MD as Consulting Physician (Ophthalmology)  Advanced Directives 12/10/2018 12/03/2017 12/05/2016 11/29/2016 11/09/2015 10/08/2015 11/04/2014  Does Patient Have a Medical Advance Directive? Yes Yes Yes Yes No Yes Yes  Type of Paramedic of [place];Living will [place];Living will [place];Living will [place];Living will - - -  Does patient want to make changes to medical advance directive? No - Guardian declined No - Patient declined - - - - -  Copy of [place] in Chart? No - copy requested No - copy requested - No - copy requested - No - copy requested No - copy requested  Would patient like information on creating a medical advance directive? - - - - No - patient declined information - [facility] Utilization Over the Past 12 Months: # of hospitalizations or ER visits: 0 # of surgeries: 0  Review of Systems    Patient reports that her overall health is unchanged compared to last year.     All other systems negative as reported by patient.  Pain Assessment Pain Score: 0-No pain     Current Medications & Allergies (verified) Allergies as of 12/10/2018      Reactions   Ciprofloxacin Anaphylaxis   Benicar [olmesartan Medoxomil] Other (See Comments)   Pt doesn't remember reaction   Clarithromycin Other (See Comments)   Pt doesn't remember reaction   Sulfa Antibiotics Other (See Comments)   Dr told patient not to take any more   Alendronate Sodium Other (See Comments)   Upset stomach   Celebrex [celecoxib] Other (See Comments)   Dr said that it was too strong for her   Penicillins Rash      Medication List       Accurate as of Dec 10, 2018  4:35 PM. Always use your most recent med list.        aspirin 325 MG tablet Take 325 mg by mouth  See admin instructions. Take one tablet a day and more if needed for pain - up to 3 times daily   bevacizumab 1.25 mg/0.1 mL Soln Commonly known as:  AVASTIN 1.25 mg by Intravitreal route.   CITRACAL CALCIUM+D PO Take 1 capsule by mouth 2 (two) times daily.   Fish Oil 1000 MG Caps Take 1,000 mg by mouth 2 (two) times daily.   levothyroxine 100 MCG tablet Commonly known as:  SYNTHROID TAKE 1 TABLET BY MOUTH  DAILY   risedronate 35 MG tablet Commonly known as:  ACTONEL TAKE 1 TABLET BY MOUTH   EVERY 7 DAYS WITH WATER ON  EMPTY STOMACH, NOTHING BY  MOUTH OR LIE DOWN FOR NEXT  30 MINUTES.   triamterene-hydrochlorothiazide 37.5-25 MG tablet Commonly known as:  MAXZIDE-25 Take 0.5 tablets by mouth daily.   Vitamin D3 25 MCG (1000 UT) Caps Take 1,000 Units by mouth 2 (two) times daily.       History (reviewed): Past Medical History:  Diagnosis Date   Arthritis    Arthritis pain    Benign hypertensive heart disease    Breast cancer ([place])    Cancer of right breast ([place]) 09/28/2011   IDC;  T2, N0 ([facility]+);   ER+;  Her-2 neg.,  Right PM,SLN  09/08/08    Collagenous colitis    Diverticulosis of colon    External hemorrhoid    Hearing loss    Hypertension    IBS (irritable bowel syndrome)    Macular degeneration    Macular degeneration of both eyes    Neuropathy, peripheral    Nonspecific abnormal electrocardiogram (ECG) (EKG)    Osteoporosis    Other and unspecified hyperlipidemia    Palpitations    Phlebitis and thrombophlebitis of unspecified site    Prolapse of vaginal walls without mention of uterine prolapse    Shingles    Symptomatic menopausal or female climacteric states    Unspecified hypothyroidism    Past Surgical History:  Procedure Laterality Date   BACK SURGERY     BREAST LUMPECTOMY     per medical history form dated 09/27/09.   CARPAL TUNNEL RELEASE     bilateral   COLONOSCOPY  12/03/2002   Dr. Silvano Rusk   NEUROPLASTY / TRANSPOSITION MEDIAN NERVE AT CARPAL TUNNEL BILATERAL     TONSILLECTOMY AND ADENOIDECTOMY     VAGINAL HYSTERECTOMY     Family History  Problem Relation Age of Onset   Heart disease Mother        Heart failure per medical history form dated 09/27/09.   Heart disease Father        Heart attack per medical history form dated 09/27/09.   Heart attack Father    Heart disease Brother        Heart failure per medical history form dated 09/27/09.   Stroke Brother    Stroke Brother    Leukemia  Brother    Breast cancer Cousin    Healthy Daughter    Healthy Son    Colon cancer Neg Hx    Social History   Socioeconomic History   Marital status: Widowed    Spouse name: Not on file   Number of children: 2   Years of education: 30   Highest education level: 12th grade  Occupational History   Occupation: retired-homemaker  Scientist, product/process development strain: Not hard at all   Food insecurity:    Worry: Never true    Inability: Never true  Transportation needs:    Medical: No    Non-medical: No  Tobacco Use   Smoking status: Never Smoker   Smokeless tobacco: Never Used  Substance and Sexual Activity   Alcohol use: No   Drug use: No   Sexual activity: Not Currently  Lifestyle   Physical activity:    Days per week: 0 days    Minutes per session: 0 min   Stress: Only a little  Relationships   Social connections:    Talks on phone: More than three times a week    Gets together: More than three times a week    Attends religious service: More than 4 times per year    Active member of club or organization: No    Attends meetings of clubs or organizations: Never    Relationship status: Widowed  Other Topics Concern   Not on file  Social History Narrative   Widowed, 1 son one daughter, retired and homemaker. No alcohol or caffeine or tobacco.    Activities of Daily Living In your present state of health, do you have any difficulty performing the following activities: 12/10/2018  Hearing? Y  Comment wears hearing aids, but needs new set   Vision? Y  Comment Macular degeneration  Difficulty concentrating or making decisions? N  Walking or climbing stairs? Y  Comment Uses cane for balance  Dressing or bathing? N  Doing errands, shopping? Y  Comment Daughter provides transportation and does grocery shopping  Preparing Food and eating ? Y  Comment home health aid helps with cooking  Using the Toilet? N  In the past six months, have you  accidently leaked urine? Y  Comment uses incontinence supplies  Do you have problems with loss of bowel control? Y  Comment urgency at times  Managing your Medications? N  Managing your Finances? N  Housekeeping or managing your Housekeeping? Y  Comment Daughter and home health aid helps  Some recent data might be hidden        Exercise Current Exercise Habits: The patient does not participate in regular exercise at present, Exercise limited by: orthopedic condition(s)  Diet Patient reports consuming 3 meals a day and 1 snack(s) a day Patient reports that her primary diet is: Regular Patient reports that she does have regular access to food.  Patient states she eats a balanced diet of mostly fruits, vegetables, whole grains, and some lean proteins - mostly chicken and eggs.  She states she had been drinking 2 ensure per day, but recently has stopped them because she thinks it was causing diarrhea.     Depression Screen PHQ 2/9 Scores 12/10/2018 08/20/2018 03/28/2018 11/28/2017 07/24/2017 03/21/2017 11/29/2016  PHQ - 2 Score 0 0 _0 0 0  PHQ- 9 Score - - - - - - -     Fall Risk Fall Risk  12/10/2018 08/20/2018 03/28/2018 11/28/2017 07/24/2017  Falls in the past year? 0 0 No No No  Number falls in past yr: - - - - -  Injury with Fall? - - - - -  Comment - - - - -  Risk Factor Category  - - - - -  Risk for fall due to : - - - - -  Follow up Education provided;Falls prevention discussed - - - -     Objective:  Janice Reed seemed alert and oriented and she participated appropriately during our telephone visit.  Blood Pressure Weight BMI  BP Readings from Last 3 Encounters:  08/20/18 (!) 162/84  03/28/18 (!) 162/84  12/03/17 (!) 179/76   Wt Readings from Last 3 Encounters:  08/20/18 111 lb (50.3 kg)  03/28/18 111 lb (50.3 kg)  12/03/17 117 lb (53.1 kg)   BMI Readings from Last 1 Encounters:  08/20/18 21.68 kg/m    *Unable to obtain current vital signs, weight, and BMI due  to telephone visit type  Hearing/Vision   Janice Reed did  seem to have difficulty with hearing/understanding during the telephone conversation  Reports that she has had a formal eye exam by an eye care professional within the past year  Reports that she has not had a formal hearing evaluation within the past year *Unable to fully assess hearing and vision during telephone visit type  Cognitive Function: 6CIT Screen 12/10/2018  What Year? 0 points  What month? 0 points  What time? 0 points  Count back from 20 0 points  Months in reverse 0 points  Repeat phrase 0 points  Total Score 0    Normal Cognitive Function Screening: Yes (Normal:0-7, Significant for Dysfunction: >8)  Immunization & Health Maintenance Record Immunization History  Administered Date(s) Administered   Influenza Whole 04/07/2010   Influenza, High Dose Seasonal PF 06/14/2016, 05/29/2017, 05/20/2018   Influenza,inj,Quad PF,6+ Mos 06/04/2013, 05/22/2014, 05/31/2015   Pneumococcal Conjugate-13 06/04/2013   Pneumococcal Polysaccharide-23 08/07/1998   Td 02/05/2007    Health Maintenance  Topic Date Due   TETANUS/TDAP  02/04/2017   DEXA SCAN  11/10/2018   INFLUENZA VACCINE  03/08/2019   PNA vac Low Risk Adult  Completed       Assessment  This is a routine wellness examination for Janice Reed.  Health Maintenance: Due or Overdue Health Maintenance Due  Topic Date Due   TETANUS/TDAP  02/04/2017   DEXA SCAN  11/10/2018   Advised patient she is eligible for tetanus and shingrix vaccines, and that she can get at next office visit with Dr. Laurance Flatten.   Also, Dexa scans are not indicated for patients at age 56.  Advise that Dr. Laurance Flatten discuss with patient and discontinue in health maintenance if he agrees.    Janice Reed does not need a referral for Community Assistance: Care Management:   no Social Work:    no Prescription Assistance:  no Nutrition/Diabetes Education:  no   Plan:  Personalized  Goals Goals Addressed            This Visit's Progress    Exercise 3x per week (30 min per time)       Using your exercise machine to peddle with your arms and legs is a great option.      Personalized Health Maintenance & Screening Recommendations  Td vaccine Advanced directives: has an advanced directive - a copy HAS NOT been provided.  Copy requested.  Lung Cancer Screening Recommended: no (Low Dose CT Chest recommended if Age 65-80 years, 30 pack-year currently smoking OR have quit w/in past 15 years) Hepatitis C Screening recommended: not applicable   Advanced Directives: Written information was not prepared per patient's request.     Follow-up Plan  Follow-up with Chipper Herb, MD as planned  Work on increasing your physical activity to 3 times per week for 30 minutes each session.   Continue to move carefully and use assistive devices when walking to avoid falls.   I have personally reviewed and noted the following in the patients chart:    Medical and social history  Use of alcohol, tobacco or  illicit drugs   Current medications and supplements  Functional ability and status  Nutritional status  Physical activity  Advanced directives  List of other physicians  Hospitalizations, surgeries, and ER visits in previous 12 months  Vitals  Screenings to include cognitive, depression, and falls  Referrals and appointments  In addition, I have reviewed and discussed with Janice Reed certain preventive protocols, quality metrics, and best practice recommendations. A written personalized care plan for preventive services as well as general preventive health recommendations is available and can be mailed to the patient at her request.      Nolberto Hanlon, RN 12/10/2018  I have reviewed and agree with the above AWV documentation.   Evelina Dun, FNP

## 2018-12-10 NOTE — Patient Instructions (Signed)
  Ms. Palen , Thank you for taking time to talk with me for your Medicare Wellness Visit. I appreciate your ongoing commitment to your health goals. Please review the following plan we discussed and let me know if I can assist you in the future.   These are the goals we discussed: Goals    . Exercise 3x per week (30 min per time)     Using your exercise machine to peddle with your arms and legs is a great option.    . Prevent falls     Move carefully to avoid falls. Continue to use walker for balance.        This is a list of the screening recommended for you and due dates:  Health Maintenance  Topic Date Due  . Tetanus Vaccine  02/04/2017  . DEXA scan (bone density measurement)  11/10/2018  . Flu Shot  03/08/2019  . Pneumonia vaccines  Completed

## 2019-01-01 ENCOUNTER — Other Ambulatory Visit: Payer: Self-pay

## 2019-01-01 ENCOUNTER — Ambulatory Visit (INDEPENDENT_AMBULATORY_CARE_PROVIDER_SITE_OTHER): Payer: Medicare Other | Admitting: Family Medicine

## 2019-01-01 ENCOUNTER — Encounter: Payer: Self-pay | Admitting: Family Medicine

## 2019-01-01 DIAGNOSIS — R2681 Unsteadiness on feet: Secondary | ICD-10-CM | POA: Diagnosis not present

## 2019-01-01 DIAGNOSIS — E782 Mixed hyperlipidemia: Secondary | ICD-10-CM

## 2019-01-01 DIAGNOSIS — G8929 Other chronic pain: Secondary | ICD-10-CM

## 2019-01-01 DIAGNOSIS — M25552 Pain in left hip: Secondary | ICD-10-CM

## 2019-01-01 DIAGNOSIS — Z9181 History of falling: Secondary | ICD-10-CM

## 2019-01-01 DIAGNOSIS — M25561 Pain in right knee: Secondary | ICD-10-CM

## 2019-01-01 DIAGNOSIS — M810 Age-related osteoporosis without current pathological fracture: Secondary | ICD-10-CM

## 2019-01-01 DIAGNOSIS — R29898 Other symptoms and signs involving the musculoskeletal system: Secondary | ICD-10-CM

## 2019-01-01 DIAGNOSIS — E559 Vitamin D deficiency, unspecified: Secondary | ICD-10-CM | POA: Diagnosis not present

## 2019-01-01 DIAGNOSIS — E039 Hypothyroidism, unspecified: Secondary | ICD-10-CM

## 2019-01-01 DIAGNOSIS — M25551 Pain in right hip: Secondary | ICD-10-CM

## 2019-01-01 DIAGNOSIS — M25562 Pain in left knee: Secondary | ICD-10-CM

## 2019-01-01 DIAGNOSIS — I10 Essential (primary) hypertension: Secondary | ICD-10-CM

## 2019-01-01 NOTE — Progress Notes (Addendum)
Virtual Visit Via telephone Note I connected with@ on 01/01/19 by telephone and verified that I am speaking with the correct person or authorized healthcare agent using two identifiers. Janice Reed is currently located at home and there are no unauthorized people in close proximity. I completed this visit while in a private location in my home .  This visit type was conducted due to national recommendations for restrictions regarding the COVID-19 Pandemic (e.g. social distancing).  This format is felt to be most appropriate for this patient at this time.  All issues noted in this document were discussed and addressed.  No physical exam was performed.    I discussed the limitations, risks, security and privacy concerns of performing an evaluation and management service by telephone and the availability of in person appointments. I also discussed with the patient that there may be a patient responsible charge related to this service. The patient expressed understanding and agreed to proceed.   Date:  01/01/2019    ID:  Janice Reed      [birth year]        [id number]   Patient Care Team Patient Care Team: Chipper Herb, MD as PCP - General (Family Medicine) Ralene Muskrat as Physician Assistant (Chiropractic Medicine) Sherlynn Stalls, MD as Consulting Physician (Ophthalmology)  Reason for Visit: Primary Care Follow-up     History of Present Illness & Review of Systems:     Janice Reed is a 85 y.o. year old female primary care patient that presents today for a telehealth visit.  The patient is pleasant and alert and over the phone and seems much younger than her stated age of 20 years.  She is 85 years old today.  She has been practicing good hand and respiratory hygiene and is staying in the house as much as possible.  Her daughter is very supportive of her.  Today she denies any chest pain pressure tightness or shortness of breath anymore than usual.  She has lost some weight  but stopped the Ensure which was causing loose bowel movements.  She denies any trouble with swallowing and has not had any blood in the stool or black tarry bowel movements.  She continues to have some sporadic loose bowel movements.  She did bring up the fact that she took some probiotics in the past and she is going to resume taking these to see if this may help some without taking any Imodium.  She denies any trouble with passing her water other than her typical incontinence and she does wear pads.  She is also seeing a different eye doctor, Dr. Sherlynn Stalls because her previous ophthalmologist left.  She has a visit coming up with Dr. Baird Cancer in the next couple weeks.  Review of systems as stated, otherwise negative.  The patient does not have symptoms concerning for COVID-19 infection (fever, chills, cough, or new shortness of breath).      Current Medications (Verified) Allergies as of 01/01/2019      Reactions   Ciprofloxacin Anaphylaxis   Benicar [olmesartan Medoxomil] Other (See Comments)   Pt doesn't remember reaction   Clarithromycin Other (See Comments)   Pt doesn't remember reaction   Sulfa Antibiotics Other (See Comments)   Dr told patient not to take any more   Alendronate Sodium Other (See Comments)   Upset stomach   Celebrex [celecoxib] Other (See Comments)   Dr said that it was too strong for her  Penicillins Rash      Medication List       Accurate as of Jan 01, 2019 10:03 AM. If you have any questions, ask your nurse or doctor.        aspirin 325 MG tablet Take 325 mg by mouth See admin instructions. Take one tablet a day and more if needed for pain - up to 3 times daily   bevacizumab 1.25 mg/0.1 mL Soln Commonly known as:  AVASTIN 1.25 mg by Intravitreal route.   CITRACAL CALCIUM+D PO Take 1 capsule by mouth 2 (two) times daily.   Fish Oil 1000 MG Caps Take 1,000 mg by mouth 2 (two) times daily.   levothyroxine 100 MCG tablet Commonly known as:   SYNTHROID TAKE 1 TABLET BY MOUTH  DAILY   risedronate 35 MG tablet Commonly known as:  ACTONEL TAKE 1 TABLET BY MOUTH  EVERY 7 DAYS WITH WATER ON  EMPTY STOMACH, NOTHING BY  MOUTH OR LIE DOWN FOR NEXT  30 MINUTES.   triamterene-hydrochlorothiazide 37.5-25 MG tablet Commonly known as:  MAXZIDE-25 Take 0.5 tablets by mouth daily.   Vitamin D3 25 MCG (1000 UT) Caps Take 1,000 Units by mouth 2 (two) times daily.           Allergies (Verified)    Ciprofloxacin; Benicar [olmesartan medoxomil]; Clarithromycin; Sulfa antibiotics; Alendronate sodium; Celebrex [celecoxib]; and Penicillins  Past Medical History Past Medical History:  Diagnosis Date   Arthritis    Arthritis pain    Benign hypertensive heart disease    Breast cancer ([place])    Cancer of right breast ([place]) 09/28/2011   IDC;  T2, N0 (IHC+);   ER+;  Her-2 neg.,  Right PM,SLN  09/08/08    Collagenous colitis    Diverticulosis of colon    External hemorrhoid    Hearing loss    Hypertension    IBS (irritable bowel syndrome)    Macular degeneration    Macular degeneration of both eyes    Neuropathy, peripheral    Nonspecific abnormal electrocardiogram (ECG) (EKG)    Osteoporosis    Other and unspecified hyperlipidemia    Palpitations    Phlebitis and thrombophlebitis of unspecified site    Prolapse of vaginal walls without mention of uterine prolapse    Shingles    Symptomatic menopausal or female climacteric states    Unspecified hypothyroidism      Past Surgical History:  Procedure Laterality Date   BACK SURGERY     BREAST LUMPECTOMY     per medical history form dated 09/27/09.   CARPAL TUNNEL RELEASE     bilateral   COLONOSCOPY  12/03/2002   Dr. Silvano Rusk   NEUROPLASTY / TRANSPOSITION MEDIAN NERVE AT CARPAL TUNNEL BILATERAL     TONSILLECTOMY AND ADENOIDECTOMY     VAGINAL HYSTERECTOMY      Social History   Socioeconomic History   Marital status: Widowed    Spouse name:  Not on file   Number of children: 2   Years of education: 46   Highest education level: 12th grade  Occupational History   Occupation: retired-homemaker  Scientist, product/process development strain: Not hard at [employer] insecurity:    Worry: Never true    Inability: Never true   Transportation needs:    Medical: No    Non-medical: No  Tobacco Use   Smoking status: Never Smoker   Smokeless tobacco: Never Used  Substance and Sexual Activity   Alcohol use: No  Drug use: No   Sexual activity: Not Currently  Lifestyle   Physical activity:    Days per week: 0 days    Minutes per session: 0 min   Stress: Only a little  Relationships   Social connections:    Talks on phone: More than three times a week    Gets together: More than three times a week    Attends religious service: More than 4 times per year    Active member of club or organization: No    Attends meetings of clubs or organizations: Never    Relationship status: Widowed  Other Topics Concern   Not on file  Social History Narrative   Widowed, 1 son one daughter, retired and homemaker. No alcohol or caffeine or tobacco.     Family History  Problem Relation Age of Onset   Heart disease Mother        Heart failure per medical history form dated 09/27/09.   Heart disease Father        Heart attack per medical history form dated 09/27/09.   Heart attack Father    Heart disease Brother        Heart failure per medical history form dated 09/27/09.   Stroke Brother    Stroke Brother    Leukemia Brother    Breast cancer Cousin    Healthy Daughter    Healthy Son    Colon cancer Neg Hx       Labs/Other Tests and Data Reviewed:    Wt Readings from Last 3 Encounters:  08/20/18 111 lb (50.3 kg)  03/28/18 111 lb (50.3 kg)  12/03/17 117 lb (53.1 kg)   Temp Readings from Last 3 Encounters:  08/20/18 (!) 97.5 F (36.4 C) (Oral)  03/28/18 (!) 97 F (36.1 C) (Oral)  11/28/17 (!) 97.5 F  (36.4 C) (Oral)   BP Readings from Last 3 Encounters:  08/20/18 (!) 162/84  03/28/18 (!) 162/84  12/03/17 (!) 179/76   Pulse Readings from Last 3 Encounters:  08/20/18 80  03/28/18 72  12/03/17 (!) 53     No results found for: HGBA1C Lab Results  Component Value Date   LDLCALC 164 (H) 08/20/2018   CREATININE 0.95 08/20/2018       Chemistry      Component Value Date/Time   NA 141 08/20/2018 1115   K 4.0 08/20/2018 1115   CL 97 08/20/2018 1115   CO2 26 08/20/2018 1115   BUN 22 08/20/2018 1115   CREATININE 0.95 08/20/2018 1115   CREATININE 0.96 01/27/2013 1608      Component Value Date/Time   CALCIUM 10.1 08/20/2018 1115   ALKPHOS 92 08/20/2018 1115   AST 22 08/20/2018 1115   ALT 18 08/20/2018 1115   BILITOT 0.4 08/20/2018 1115         OBSERVATIONS/ OBJECTIVE:     Patient is alert and pleasant.  She says her weight is about 108 pounds.  She denies any edema and she has not had any falls.  She does use her walker regularly.  She has stopped taking Ensure because of loose bowel movements associated with this.  She brought up the idea of taking align and I thought that was a good suggestion.  She has a history of irritable bowel syndrome in the past.  She is also going to reduce the intake of milk cheese ice cream and dairy products.  Physical exam deferred due to nature of telephonic visit.  ASSESSMENT & PLAN  Time:   I spent 29 minutes with the patient via telephone discussing the above including Covid precautions.     Visit Diagnoses: 1. Hypothyroidism, unspecified type -Get thyroid profile and refill medicine if needed.  2. Mixed hyperlipidemia -Check lipid liver panel when patient comes to the office for blood work and make adjustments pending results of this.  3. Vitamin D deficiency -Continue vitamin D replacement along with calcium.  4. Chronic arthralgias of knees and hips -Take Tylenol if needed for aches and pains  5. Gait instability -Use  walker regularly around the house  6. At high risk for falls -Continue to not be in a hurry and use walker regularly and continue with alendronate and take Tylenol if needed for aches and pains  7. Benign essential HTN -Check blood pressures more regularly and bring readings to the office when lab work is drawn  8. Weakness of both lower extremities -Use walker and stay well-hydrated  9. Age-related osteoporosis without current pathological fracture -Use walker and continue take calcium and vitamin D replacement along with alendronate.  Patient Instructions  Continue to drink plenty of fluids and stay well-hydrated Avoid milk cheese ice cream and dairy products because of increased risk of loose bowel movements Continue with calcium and vitamin D replacement Continue to practice good hand and respiratory hygiene Come to the office in the next 3 to 4 weeks for lab work.  Discussed with my nurse Roselyn Reef the best time of the day to come.  Wear protective equipment. Start a probiotic like align and take 1 daily.     The above assessment and management plan was discussed with the patient. The patient verbalized understanding of and has agreed to the management plan. Patient is aware to call the clinic if symptoms persist or worsen. Patient is aware when to return to the clinic for a follow-up visit. Patient educated on when it is appropriate to go to the emergency department.    Chipper Herb, MD [address], [place], [place] [address] Ph [phone]   Arrie Senate MD

## 2019-01-01 NOTE — Patient Instructions (Signed)
Continue to drink plenty of fluids and stay well-hydrated Avoid milk cheese ice cream and dairy products because of increased risk of loose bowel movements Continue with calcium and vitamin D replacement Continue to practice good hand and respiratory hygiene Come to the office in the next 3 to 4 weeks for lab work.  Discussed with my nurse Roselyn Reef the best time of the day to come.  Wear protective equipment. Start a probiotic like align and take 1 daily.

## 2019-01-01 NOTE — Addendum Note (Signed)
Addended by: Zannie Cove on: 01/01/2019 01:31 PM   Modules accepted: Orders

## 2019-01-03 DIAGNOSIS — H353231 Exudative age-related macular degeneration, bilateral, with active choroidal neovascularization: Secondary | ICD-10-CM | POA: Diagnosis not present

## 2019-01-13 ENCOUNTER — Telehealth: Payer: Self-pay | Admitting: Family Medicine

## 2019-01-13 NOTE — Telephone Encounter (Signed)
Patient aware orders have been placed and can come to this office to have labs drawn

## 2019-01-15 ENCOUNTER — Other Ambulatory Visit: Payer: Medicare Other

## 2019-01-15 ENCOUNTER — Other Ambulatory Visit: Payer: Self-pay

## 2019-01-15 DIAGNOSIS — M25562 Pain in left knee: Secondary | ICD-10-CM

## 2019-01-15 DIAGNOSIS — E559 Vitamin D deficiency, unspecified: Secondary | ICD-10-CM | POA: Diagnosis not present

## 2019-01-15 DIAGNOSIS — R29898 Other symptoms and signs involving the musculoskeletal system: Secondary | ICD-10-CM

## 2019-01-15 DIAGNOSIS — R2681 Unsteadiness on feet: Secondary | ICD-10-CM

## 2019-01-15 DIAGNOSIS — G8929 Other chronic pain: Secondary | ICD-10-CM

## 2019-01-15 DIAGNOSIS — E782 Mixed hyperlipidemia: Secondary | ICD-10-CM | POA: Diagnosis not present

## 2019-01-15 DIAGNOSIS — E039 Hypothyroidism, unspecified: Secondary | ICD-10-CM | POA: Diagnosis not present

## 2019-01-15 DIAGNOSIS — I10 Essential (primary) hypertension: Secondary | ICD-10-CM | POA: Diagnosis not present

## 2019-01-15 DIAGNOSIS — M810 Age-related osteoporosis without current pathological fracture: Secondary | ICD-10-CM | POA: Diagnosis not present

## 2019-01-15 DIAGNOSIS — Z9181 History of falling: Secondary | ICD-10-CM

## 2019-01-16 LAB — BMP8+EGFR
BUN/Creatinine Ratio: 20 (ref 12–28)
BUN: 19 mg/dL (ref 10–36)
CO2: 23 mmol/L (ref 20–29)
Calcium: 9.8 mg/dL (ref 8.7–10.3)
Chloride: 97 mmol/L (ref 96–106)
Creatinine, Ser: 0.97 mg/dL (ref 0.57–1.00)
GFR calc Af Amer: 57 mL/min/{1.73_m2} — ABNORMAL LOW (ref >59)
GFR calc non Af Amer: 50 mL/min/{1.73_m2} — ABNORMAL LOW (ref >59)
Glucose: 88 mg/dL (ref 65–99)
Potassium: 4 mmol/L (ref 3.5–5.2)
Sodium: 139 mmol/L (ref 134–144)

## 2019-01-16 LAB — VITAMIN D 25 HYDROXY (VIT D DEFICIENCY, FRACTURES): Vit D, 25-Hydroxy: 44.9 ng/mL (ref 30.0–100.0)

## 2019-01-16 LAB — CBC WITH DIFFERENTIAL/PLATELET
Basophils Absolute: 0.1 10*3/uL (ref 0.0–0.2)
Basos: 2 %
EOS (ABSOLUTE): 0.1 10*3/uL (ref 0.0–0.4)
Eos: 3 %
Hematocrit: 46.6 % (ref 34.0–46.6)
Hemoglobin: 15.9 g/dL (ref 11.1–15.9)
Immature Grans (Abs): 0 10*3/uL (ref 0.0–0.1)
Immature Granulocytes: 0 %
Lymphocytes Absolute: 1.9 10*3/uL (ref 0.7–3.1)
Lymphs: 37 %
MCH: 30.7 pg (ref 26.6–33.0)
MCHC: 34.1 g/dL (ref 31.5–35.7)
MCV: 90 fL (ref 79–97)
Monocytes Absolute: 0.5 10*3/uL (ref 0.1–0.9)
Monocytes: 10 %
Neutrophils Absolute: 2.5 10*3/uL (ref 1.4–7.0)
Neutrophils: 48 %
Platelets: 222 10*3/uL (ref 150–450)
RBC: 5.18 x10E6/uL (ref 3.77–5.28)
RDW: 13.2 % (ref 11.7–15.4)
WBC: 5.2 10*3/uL (ref 3.4–10.8)

## 2019-01-16 LAB — HEPATIC FUNCTION PANEL
ALT: 11 IU/L (ref 0–32)
AST: 21 IU/L (ref 0–40)
Albumin: 4.2 g/dL (ref 3.5–4.6)
Alkaline Phosphatase: 93 IU/L (ref 39–117)
Bilirubin Total: 0.5 mg/dL (ref 0.0–1.2)
Bilirubin, Direct: 0.12 mg/dL (ref 0.00–0.40)
Total Protein: 6.7 g/dL (ref 6.0–8.5)

## 2019-01-16 LAB — LIPID PANEL
Chol/HDL Ratio: 3.3 ratio (ref 0.0–4.4)
Cholesterol, Total: 247 mg/dL — ABNORMAL HIGH (ref 100–199)
HDL: 75 mg/dL (ref >39)
LDL Calculated: 154 mg/dL — ABNORMAL HIGH (ref 0–99)
Triglycerides: 88 mg/dL (ref 0–149)
VLDL Cholesterol Cal: 18 mg/dL (ref 5–40)

## 2019-01-16 LAB — THYROID PANEL WITH TSH
Free Thyroxine Index: 3.5 (ref 1.2–4.9)
T3 Uptake Ratio: 29 % (ref 24–39)
T4, Total: 12.2 ug/dL — ABNORMAL HIGH (ref 4.5–12.0)
TSH: 1.52 u[IU]/mL (ref 0.450–4.500)

## 2019-02-18 DIAGNOSIS — M79676 Pain in unspecified toe(s): Secondary | ICD-10-CM | POA: Diagnosis not present

## 2019-02-18 DIAGNOSIS — B351 Tinea unguium: Secondary | ICD-10-CM | POA: Diagnosis not present

## 2019-02-18 DIAGNOSIS — L84 Corns and callosities: Secondary | ICD-10-CM | POA: Diagnosis not present

## 2019-02-28 DIAGNOSIS — H43813 Vitreous degeneration, bilateral: Secondary | ICD-10-CM | POA: Diagnosis not present

## 2019-02-28 DIAGNOSIS — H353231 Exudative age-related macular degeneration, bilateral, with active choroidal neovascularization: Secondary | ICD-10-CM | POA: Diagnosis not present

## 2019-03-09 ENCOUNTER — Other Ambulatory Visit: Payer: Self-pay | Admitting: Family Medicine

## 2019-04-30 ENCOUNTER — Other Ambulatory Visit: Payer: Self-pay | Admitting: *Deleted

## 2019-04-30 MED ORDER — LEVOTHYROXINE SODIUM 100 MCG PO TABS: 100.0000 ug | ORAL_TABLET | Freq: Every day | ORAL | 2 refills | Status: DC

## 2019-05-03 ENCOUNTER — Other Ambulatory Visit: Payer: Self-pay | Admitting: Family Medicine

## 2019-05-05 ENCOUNTER — Other Ambulatory Visit: Payer: Self-pay

## 2019-05-05 ENCOUNTER — Ambulatory Visit (INDEPENDENT_AMBULATORY_CARE_PROVIDER_SITE_OTHER): Payer: Medicare Other | Admitting: Family Medicine

## 2019-05-05 DIAGNOSIS — N183 Chronic kidney disease, stage 3 unspecified: Secondary | ICD-10-CM

## 2019-05-05 DIAGNOSIS — I129 Hypertensive chronic kidney disease with stage 1 through stage 4 chronic kidney disease, or unspecified chronic kidney disease: Secondary | ICD-10-CM

## 2019-05-05 DIAGNOSIS — Z7409 Other reduced mobility: Secondary | ICD-10-CM

## 2019-05-05 DIAGNOSIS — M810 Age-related osteoporosis without current pathological fracture: Secondary | ICD-10-CM

## 2019-05-05 DIAGNOSIS — E782 Mixed hyperlipidemia: Secondary | ICD-10-CM | POA: Diagnosis not present

## 2019-05-05 DIAGNOSIS — E039 Hypothyroidism, unspecified: Secondary | ICD-10-CM | POA: Diagnosis not present

## 2019-05-05 DIAGNOSIS — S40022A Contusion of left upper arm, initial encounter: Secondary | ICD-10-CM

## 2019-05-05 NOTE — Progress Notes (Signed)
Telephone visit  Subjective: CC: Hypothyroidism PCP: No primary care provider on file. SKA:JGOT S Janice Reed is a 85 y.o. female calls for telephone consult today. Patient provides verbal consent for consult held via phone.  Location of patient: home Location of provider: Working remotely from home Others present for call: none  1. Hypothyroidism No h/o radiation, surgery to the neck.  Incidentally found to be low on labs.  FamHx negative for hypothyroidism She reports compliance with thyroid medication.  Denies change in bowel habits, change in voice, difficulty swallowing, change in weight, heart palpitations.  2. Hypertension/ edema Patient reports intermittent lower extremity edema to ankles.  She reports compliance with triamterene/hydrochlorothiazide, which she takes half a tablet of.  No chest pain, shortness of breath.  No dizziness or falls since 2017.  3.  Impaired mobility Patient reports impaired mobility.  She uses walker for ambulation.  She no longer drives and relies on others to help get her to and from appointments.  She denies any falls but does report having hit her left upper extremity against something that subsequently produced a very large bruise about a month ago.  The bruise has gotten quite a bit better but she still has 1 small area that is about a quarter sized spot that is somewhat puffy.  Denies any significant heat or redness.  No pain.  She has been applying ice to the affected area in efforts to improve it.   ROS: Per HPI  Allergies  Allergen Reactions  . Ciprofloxacin Anaphylaxis  . Benicar [Olmesartan Medoxomil] Other (See Comments)    Pt doesn't remember reaction  . Clarithromycin Other (See Comments)    Pt doesn't remember reaction  . Sulfa Antibiotics Other (See Comments)    Dr told patient not to take any more  . Alendronate Sodium Other (See Comments)    Upset stomach  . Celebrex [Celecoxib] Other (See Comments)    Dr said that it was too  strong for her  . Penicillins Rash   Past Medical History:  Diagnosis Date  . Arthritis   . Arthritis pain   . Benign hypertensive heart disease   . Breast cancer ([place])   . Cancer of right breast ([place]) 09/28/2011   IDC;  T2, N0 (IHC+);   ER+;  Her-2 neg.,  Right PM,SLN  09/08/08   . Collagenous colitis   . Diverticulosis of colon   . External hemorrhoid   . Hearing loss   . Hypertension   . IBS (irritable bowel syndrome)   . Macular degeneration   . Macular degeneration of both eyes   . Neuropathy, peripheral   . Nonspecific abnormal electrocardiogram (ECG) (EKG)   . Osteoporosis   . Other and unspecified hyperlipidemia   . Palpitations   . Phlebitis and thrombophlebitis of unspecified site   . Prolapse of vaginal walls without mention of uterine prolapse   . Shingles   . Symptomatic menopausal or female climacteric states   . Unspecified hypothyroidism     Current Outpatient Medications:  .  aspirin 325 MG tablet, Take 325 mg by mouth See admin instructions. Take one tablet a day and more if needed for pain - up to 3 times daily, Disp: , Rfl:  .  bevacizumab (AVASTIN) 1.25 mg/0.1 mL SOLN, 1.25 mg by Intravitreal route. , Disp: , Rfl:  .  Calcium-Magnesium-Vitamin D (CITRACAL CALCIUM+D PO), Take 1 capsule by mouth 2 (two) times daily.  , Disp: , Rfl:  .  Cholecalciferol (VITAMIN D3) 1000  UNITS CAPS, Take 1,000 Units by mouth 2 (two) times daily. , Disp: , Rfl:  .  levothyroxine (SYNTHROID) 100 MCG tablet, Take 1 tablet (100 mcg total) by mouth daily., Disp: 90 tablet, Rfl: 2 .  Omega-3 Fatty Acids (FISH OIL) 1000 MG CAPS, Take 1,000 mg by mouth 2 (two) times daily. , Disp: , Rfl:  .  risedronate (ACTONEL) 35 MG tablet, TAKE 1 TABLET BY MOUTH  EVERY 7 DAYS WITH WATER ON  EMPTY STOMACH, NOTHING BY  MOUTH OR LIE DOWN FOR NEXT  30 MINUTES., Disp: 12 tablet, Rfl: 3 .  triamterene-hydrochlorothiazide (MAXZIDE-25) 37.5-25 MG tablet, TAKE ONE-HALF TABLET BY  MOUTH DAILY, Disp: 45  tablet, Rfl: 0  Assessment/ Plan: 85 y.o. female    1. Acquired hypothyroidism Asymptomatic.  She will come in for fasting labs this week.  No refills needed of Synthroid at this time - CMP14+EGFR; Future - CBC; Future - Thyroid Panel With TSH; Future  2. Benign hypertension with chronic kidney disease, stage III ([place]) She will come in for blood pressure check with nurse this week.  Plan for renal function check - CMP14+EGFR; Future  3. Mixed hyperlipidemia She will come in for fasting labs - CMP14+EGFR; Future - Lipid panel; Future  4. Age-related osteoporosis without current pathological fracture On Actonel.  Unsure as to how long she has been on this.  May need to consider drug holiday - CMP14+EGFR; Future  5. Impaired mobility Uses walker  6. Contusion of left upper extremity, initial encounter We discussed application of ice and monitoring for signs and symptoms concerning for infection.   Start time: 9:28am End time: 8:48pm  Total time spent on patient care (including telephone call/ virtual visit): 25 minutes  [place], [place] [phone]

## 2019-05-09 ENCOUNTER — Ambulatory Visit (INDEPENDENT_AMBULATORY_CARE_PROVIDER_SITE_OTHER): Payer: Medicare Other | Admitting: Family Medicine

## 2019-05-09 ENCOUNTER — Other Ambulatory Visit: Payer: Self-pay

## 2019-05-09 ENCOUNTER — Encounter: Payer: Self-pay | Admitting: Family Medicine

## 2019-05-09 VITALS — BP 140/68 | HR 7 | Temp 97.3°F | Ht 60.0 in | Wt 108.0 lb

## 2019-05-09 DIAGNOSIS — E039 Hypothyroidism, unspecified: Secondary | ICD-10-CM | POA: Diagnosis not present

## 2019-05-09 DIAGNOSIS — M810 Age-related osteoporosis without current pathological fracture: Secondary | ICD-10-CM

## 2019-05-09 DIAGNOSIS — E782 Mixed hyperlipidemia: Secondary | ICD-10-CM

## 2019-05-09 DIAGNOSIS — N183 Chronic kidney disease, stage 3 unspecified: Secondary | ICD-10-CM

## 2019-05-09 DIAGNOSIS — Z23 Encounter for immunization: Secondary | ICD-10-CM | POA: Diagnosis not present

## 2019-05-09 DIAGNOSIS — I129 Hypertensive chronic kidney disease with stage 1 through stage 4 chronic kidney disease, or unspecified chronic kidney disease: Secondary | ICD-10-CM

## 2019-05-09 NOTE — Patient Instructions (Addendum)
Ok to use Tylenol Arthritis every 8 hours for pain relief I recommend VOLTAREN gel for your knee pain.  You can use it FOUR times daily if needed.  I think you had something call vasovagal syncope (passing out).  Very common in singers.  You had labs performed today.  You will be contacted with the results of the labs once they are available, usually in the next 3 business days for routine lab work.  If you have an active my chart account, they will be released to your MyChart.  If you prefer to have these labs released to you via telephone, please let us know.  If you had a pap smear or biopsy performed, expect to be contacted in about 7-10 days.

## 2019-05-09 NOTE — Progress Notes (Signed)
Subjective: CC: Follow-up blood pressure PCP: [name], DO Janice Reed is a 85 y.o. female presenting to clinic today for:  1.  Hypertension Patient reports compliance with Maxide 37.5-25 mg.  She takes half a tablet of this daily for fluid but was not aware that it was a blood pressure medication.  She denies any chest pain, shortness of breath.  She had an episode of syncope when she was singing in church a while back and at that time her other blood pressure medications were discontinued.  She notes that she was checked out in the emergency department and they "could not find any thing wrong with her".   ROS: Per HPI  Allergies  Allergen Reactions  . Ciprofloxacin Anaphylaxis  . Benicar [Olmesartan Medoxomil] Other (See Comments)    Pt doesn't remember reaction  . Clarithromycin Other (See Comments)    Pt doesn't remember reaction  . Sulfa Antibiotics Other (See Comments)    Dr told patient not to take any more  . Alendronate Sodium Other (See Comments)    Upset stomach  . Celebrex [Celecoxib] Other (See Comments)    Dr said that it was too strong for her  . Penicillins Rash   Past Medical History:  Diagnosis Date  . Arthritis   . Arthritis pain   . Benign hypertensive heart disease   . Breast cancer ([place])   . Cancer of right breast ([place]) 09/28/2011   IDC;  T2, N0 (IHC+);   ER+;  Her-2 neg.,  Right PM,SLN  09/08/08   . Collagenous colitis   . Diverticulosis of colon   . External hemorrhoid   . Hearing loss   . Hypertension   . IBS (irritable bowel syndrome)   . Macular degeneration   . Macular degeneration of both eyes   . Neuropathy, peripheral   . Nonspecific abnormal electrocardiogram (ECG) (EKG)   . Osteoporosis   . Other and unspecified hyperlipidemia   . Palpitations   . Phlebitis and thrombophlebitis of unspecified site   . Prolapse of vaginal walls without mention of uterine prolapse   . Shingles   . Symptomatic menopausal or female  climacteric states   . Unspecified hypothyroidism     Current Outpatient Medications:  .  aspirin 325 MG tablet, Take 325 mg by mouth See admin instructions. Take one tablet a day and more if needed for pain - up to 3 times daily, Disp: , Rfl:  .  bevacizumab (AVASTIN) 1.25 mg/0.1 mL SOLN, 1.25 mg by Intravitreal route. , Disp: , Rfl:  .  Calcium-Magnesium-Vitamin D (CITRACAL CALCIUM+D PO), Take 1 capsule by mouth 2 (two) times daily.  , Disp: , Rfl:  .  Cholecalciferol (VITAMIN D3) 1000 UNITS CAPS, Take 1,000 Units by mouth 2 (two) times daily. , Disp: , Rfl:  .  levothyroxine (SYNTHROID) 100 MCG tablet, Take 1 tablet (100 mcg total) by mouth daily., Disp: 90 tablet, Rfl: 2 .  Omega-3 Fatty Acids (FISH OIL) 1000 MG CAPS, Take 1,000 mg by mouth 2 (two) times daily. , Disp: , Rfl:  .  risedronate (ACTONEL) 35 MG tablet, TAKE 1 TABLET BY MOUTH  EVERY 7 DAYS WITH WATER ON  EMPTY STOMACH, NOTHING BY  MOUTH OR LIE DOWN FOR NEXT  30 MINUTES., Disp: 12 tablet, Rfl: 3 .  triamterene-hydrochlorothiazide (MAXZIDE-25) 37.5-25 MG tablet, TAKE ONE-HALF TABLET BY  MOUTH DAILY (Patient not taking: Reported on 05/09/2019), Disp: 45 tablet, Rfl: 0 Social History   Socioeconomic History  .  Marital status: Widowed    Spouse name: Not on file  . Number of children: 2  . Years of education: 23  . Highest education level: 12th grade  Occupational History  . Occupation: Firefighter  Social Needs  . Financial resource strain: Not hard at all  . Food insecurity    Worry: Never true    Inability: Never true  . Transportation needs    Medical: No    Non-medical: No  Tobacco Use  . Smoking status: Never Smoker  . Smokeless tobacco: Never Used  Substance and Sexual Activity  . Alcohol use: No  . Drug use: No  . Sexual activity: Not Currently  Lifestyle  . Physical activity    Days per week: 0 days    Minutes per session: 0 min  . Stress: Only a little  Relationships  . Social connections     Talks on phone: More than three times a week    Gets together: More than three times a week    Attends religious service: More than 4 times per year    Active member of club or organization: No    Attends meetings of clubs or organizations: Never    Relationship status: Widowed  . Intimate partner violence    Fear of current or ex partner: No    Emotionally abused: No    Physically abused: No    Forced sexual activity: No  Other Topics Concern  . 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 No alcohol or caffeine or tobacco.   Family History  Problem Relation Age of Onset  . Heart disease Mother        Heart failure per medical history form dated 09/27/09.  [name] Kitchen Heart disease Father        Heart attack per medical history form dated 09/27/09.  [name] Kitchen Heart attack Father   . Heart disease Brother        Heart failure per medical history form dated 09/27/09.  . Stroke Brother   . Stroke Brother   . Leukemia Brother   . Breast cancer Cousin   . Healthy Daughter   . Healthy Son   . Colon cancer Neg Hx     Objective: Office vital signs reviewed. BP 140/68   Pulse (!) 7   Temp (!) 97.3 F (36.3 C) (Temporal)   Ht 5' (1.524 m)   Wt 108 lb (49 kg)   SpO2 97%   BMI 21.09 kg/m   Physical Examination:  General: Awake, alert, well nourished, No acute distress HEENT: Normal, sclera white, MMM Cardio: regular rate and rhythm, S1S2 heard, no murmurs appreciated Pulm: clear to auscultation bilaterally, no wheezes, rhonchi or rales; normal work of breathing on room air Extremities: warm, well perfused, No edema, cyanosis or clubbing; +2 pulses bilaterally  Assessment/ Plan: 85 y.o. female   1. Benign hypertension with chronic kidney disease, stage III ([facility]) Controlled.  Continue current regimen with Maxide. - CMP14+EGFR  2. Acquired hypothyroidism Previously discussed but patient here for her labs today.  This was not discussed during  today's visit - Thyroid Panel With TSH - CBC - CMP14+EGFR  3. Mixed hyperlipidemia - Lipid panel - CMP14+EGFR  4. Age-related osteoporosis without current pathological fracture - CMP14+EGFR  5. Need for immunization against influenza Administered during today's visit - Flu Vaccine QUAD High Dose(Fluad)   No orders of the defined types were placed in this encounter.  No  orders of the defined types were placed in this encounter.    [name], DO [place] [phone]

## 2019-05-10 LAB — CBC
Hematocrit: 43 % (ref 34.0–46.6)
Hemoglobin: 15 g/dL (ref 11.1–15.9)
MCH: 31.4 pg (ref 26.6–33.0)
MCHC: 34.9 g/dL (ref 31.5–35.7)
MCV: 90 fL (ref 79–97)
Platelets: 210 10*3/uL (ref 150–450)
RBC: 4.77 x10E6/uL (ref 3.77–5.28)
RDW: 12.8 % (ref 11.7–15.4)
WBC: 5.3 10*3/uL (ref 3.4–10.8)

## 2019-05-10 LAB — CMP14+EGFR
ALT: 10 IU/L (ref 0–32)
AST: 16 IU/L (ref 0–40)
Albumin/Globulin Ratio: 1.6 (ref 1.2–2.2)
Albumin: 4.1 g/dL (ref 3.5–4.6)
Alkaline Phosphatase: 87 IU/L (ref 39–117)
BUN/Creatinine Ratio: 23 (ref 12–28)
BUN: 23 mg/dL (ref 10–36)
Bilirubin Total: 0.3 mg/dL (ref 0.0–1.2)
CO2: 24 mmol/L (ref 20–29)
Calcium: 9.7 mg/dL (ref 8.7–10.3)
Chloride: 101 mmol/L (ref 96–106)
Creatinine, Ser: 1.01 mg/dL — ABNORMAL HIGH (ref 0.57–1.00)
GFR calc Af Amer: 55 mL/min/{1.73_m2} — ABNORMAL LOW (ref >59)
GFR calc non Af Amer: 47 mL/min/{1.73_m2} — ABNORMAL LOW (ref >59)
Globulin, Total: 2.5 g/dL (ref 1.5–4.5)
Glucose: 91 mg/dL (ref 65–99)
Potassium: 4.5 mmol/L (ref 3.5–5.2)
Sodium: 141 mmol/L (ref 134–144)
Total Protein: 6.6 g/dL (ref 6.0–8.5)

## 2019-05-10 LAB — THYROID PANEL WITH TSH
Free Thyroxine Index: 2.9 (ref 1.2–4.9)
T3 Uptake Ratio: 27 % (ref 24–39)
T4, Total: 10.6 ug/dL (ref 4.5–12.0)
TSH: 2.04 u[IU]/mL (ref 0.450–4.500)

## 2019-05-10 LAB — LIPID PANEL
Chol/HDL Ratio: 3.4 ratio (ref 0.0–4.4)
Cholesterol, Total: 243 mg/dL — ABNORMAL HIGH (ref 100–199)
HDL: 71 mg/dL (ref >39)
LDL Chol Calc (NIH): 156 mg/dL — ABNORMAL HIGH (ref 0–99)
Triglycerides: 93 mg/dL (ref 0–149)
VLDL Cholesterol Cal: 16 mg/dL (ref 5–40)

## 2019-05-20 ENCOUNTER — Other Ambulatory Visit: Payer: Self-pay | Admitting: Family Medicine

## 2019-06-16 ENCOUNTER — Other Ambulatory Visit: Payer: Self-pay

## 2019-06-17 ENCOUNTER — Encounter: Payer: Self-pay | Admitting: Family Medicine

## 2019-06-17 ENCOUNTER — Ambulatory Visit (INDEPENDENT_AMBULATORY_CARE_PROVIDER_SITE_OTHER): Payer: Medicare Other | Admitting: Family Medicine

## 2019-06-17 VITALS — BP 137/71 | HR 46 | Temp 98.0°F | Resp 18 | Ht 60.0 in | Wt 108.0 lb

## 2019-06-17 DIAGNOSIS — I129 Hypertensive chronic kidney disease with stage 1 through stage 4 chronic kidney disease, or unspecified chronic kidney disease: Secondary | ICD-10-CM

## 2019-06-17 DIAGNOSIS — R5381 Other malaise: Secondary | ICD-10-CM | POA: Insufficient documentation

## 2019-06-17 DIAGNOSIS — N183 Chronic kidney disease, stage 3 unspecified: Secondary | ICD-10-CM | POA: Diagnosis not present

## 2019-06-17 DIAGNOSIS — N3946 Mixed incontinence: Secondary | ICD-10-CM | POA: Diagnosis not present

## 2019-06-17 NOTE — Patient Instructions (Signed)

## 2019-06-17 NOTE — Progress Notes (Signed)
Subjective: CC: Hypertension PCP: [name], DO BSJ:GGEZ S Mcilrath is a 85 y.o. female presenting to clinic today for:  1.  Hypertension Patient reports compliance with half a tablet of Maxide daily.  No chest pain, shortness of breath, falls.  She has had no repeat syncopal episodes.  She is been very careful while singing and with position changes.  2.  Weakness Patient reports she does not get around as easily as she used to.  She has been in physical therapy a couple of times and found it useful unfortunately never stays strong when she finishes the physical therapy.  She does report chronic knee pain and has yet to start Voltaren gel.  She has not yet started the 3 times daily Tylenol regimen.  She wanted to make sure that these were the right regimens to take.  She uses a rolling walker for ambulation but finds it difficult to navigate her gravel driveway with this and therefore would like some tips on how to get stronger.  3.  Urinary incontinence Patient reports longstanding history of urinary incontinence.  She describes getting excited or sneezing and she has some leakage of urine.  Sometimes she goes to sleep and when she gets up from a seated position she will also have leakage.  She has not tried anything to treat this yet.   ROS: Per HPI  Allergies  Allergen Reactions  . Ciprofloxacin Anaphylaxis  . Benicar [Olmesartan Medoxomil] Other (See Comments)    Pt doesn't remember reaction  . Clarithromycin Other (See Comments)    Pt doesn't remember reaction  . Sulfa Antibiotics Other (See Comments)    Dr told patient not to take any more  . Alendronate Sodium Other (See Comments)    Upset stomach  . Celebrex [Celecoxib] Other (See Comments)    Dr said that it was too strong for her  . Penicillins Rash   Past Medical History:  Diagnosis Date  . Arthritis   . Arthritis pain   . Benign hypertensive heart disease   . Breast cancer ([place])   . Cancer of right  breast ([place]) 09/28/2011   IDC;  T2, N0 (IHC+);   ER+;  Her-2 neg.,  Right PM,SLN  09/08/08   . Collagenous colitis   . Diverticulosis of colon   . External hemorrhoid   . Hearing loss   . Hypertension   . IBS (irritable bowel syndrome)   . Macular degeneration   . Macular degeneration of both eyes   . Neuropathy, peripheral   . Nonspecific abnormal electrocardiogram (ECG) (EKG)   . Osteoporosis   . Other and unspecified hyperlipidemia   . Palpitations   . Phlebitis and thrombophlebitis of unspecified site   . Prolapse of vaginal walls without mention of uterine prolapse   . Shingles   . Symptomatic menopausal or female climacteric states   . Unspecified hypothyroidism     Current Outpatient Medications:  .  aspirin 325 MG tablet, Take 325 mg by mouth See admin instructions. Take one tablet a day and more if needed for pain - up to 3 times daily, Disp: , Rfl:  .  bevacizumab (AVASTIN) 1.25 mg/0.1 mL SOLN, 1.25 mg by Intravitreal route. , Disp: , Rfl:  .  Calcium-Magnesium-Vitamin D (CITRACAL CALCIUM+D PO), Take 1 capsule by mouth 2 (two) times daily.  , Disp: , Rfl:  .  Cholecalciferol (VITAMIN D3) 1000 UNITS CAPS, Take 1,000 Units by mouth 2 (two) times daily. , Disp: , Rfl:  .  levothyroxine (SYNTHROID) 100 MCG tablet, Take 1 tablet (100 mcg total) by mouth daily., Disp: 90 tablet, Rfl: 2 .  Omega-3 Fatty Acids (FISH OIL) 1000 MG CAPS, Take 1,000 mg by mouth 2 (two) times daily. , Disp: , Rfl:  .  risedronate (ACTONEL) 35 MG tablet, TAKE 1 TABLET BY MOUTH  EVERY 7 DAYS WITH WATER ON  EMPTY STOMACH, NOTHING BY  MOUTH OR LIE DOWN FOR NEXT  30 MINUTES., Disp: 12 tablet, Rfl: 3 .  triamterene-hydrochlorothiazide (MAXZIDE-25) 37.5-25 MG tablet, TAKE ONE-HALF TABLET BY  MOUTH DAILY, Disp: 45 tablet, Rfl: 0 Social History   Socioeconomic History  . Marital status: Widowed    Spouse name: Not on file  . Number of children: 2  . Years of education: 12  . Highest education level: 12th  grade  Occupational History  . Occupation: Firefighter  Social Needs  . Financial resource strain: Not hard at all  . Food insecurity    Worry: Never true    Inability: Never true  . Transportation needs    Medical: No    Non-medical: No  Tobacco Use  . Smoking status: Never Smoker  . Smokeless tobacco: Never Used  Substance and Sexual Activity  . Alcohol use: No  . Drug use: No  . Sexual activity: Not Currently  Lifestyle  . Physical activity    Days per week: 0 days    Minutes per session: 0 min  . Stress: Only a little  Relationships  . Social connections    Talks on phone: More than three times a week    Gets together: More than three times a week    Attends religious service: More than 4 times per year    Active member of club or organization: No    Attends meetings of clubs or organizations: Never    Relationship status: Widowed  . Intimate partner violence    Fear of current or ex partner: No    Emotionally abused: No    Physically abused: No    Forced sexual activity: No  Other Topics Concern  . 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 No alcohol or caffeine or tobacco.   Family History  Problem Relation Age of Onset  . Heart disease Mother        Heart failure per medical history form dated 09/27/09.  [name] Kitchen Heart disease Father        Heart attack per medical history form dated 09/27/09.  [name] Kitchen Heart attack Father   . Heart disease Brother        Heart failure per medical history form dated 09/27/09.  . Stroke Brother   . Stroke Brother   . Leukemia Brother   . Breast cancer Cousin   . Healthy Daughter   . Healthy Son   . Colon cancer Neg Hx     Objective: Office vital signs reviewed. BP 137/71   Pulse (!) 46   Temp 98 F (36.7 C)   Resp 18   Ht 5' (1.524 m)   Wt 108 lb (49 kg)   SpO2 95%   BMI 21.09 kg/m   Physical Examination:  General: Awake, alert, elderly female, No acute distress  HEENT: Normal, sclera white Cardio: regular rate and rhythm, occasional extra beats.  S1S2 heard, no murmurs appreciated Pulm: clear to auscultation bilaterally, no wheezes, rhonchi or rales; normal work of breathing on room air Extremities: warm, well perfused, No edema,  cyanosis or clubbing; +2 pulses bilaterally MSK: Low core strength.  Slow gait and hunched station Neuro: Alert and oriented.  Assessment/ Plan: 85 y.o. female   1. Benign hypertension with chronic kidney disease, stage III (HCC) Controlled.  Continue current regimen  2. Physical deconditioning Home strength training exercises reviewed and handout provided.  May consider PT  3. Mixed stress and urge urinary incontinence Kegel exercises recommended.  We discussed possibility for Myrbetriq.    No orders of the defined types were placed in this encounter.  No orders of the defined types were placed in this encounter.    [name], DO [place] [phone]

## 2019-09-02 ENCOUNTER — Ambulatory Visit: Payer: Medicare Other

## 2019-09-22 ENCOUNTER — Other Ambulatory Visit: Payer: Self-pay | Admitting: Family Medicine

## 2019-09-23 ENCOUNTER — Ambulatory Visit: Payer: Medicare Other

## 2019-10-16 ENCOUNTER — Other Ambulatory Visit: Payer: Self-pay

## 2019-10-17 ENCOUNTER — Encounter: Payer: Self-pay | Admitting: Family Medicine

## 2019-10-17 ENCOUNTER — Ambulatory Visit (INDEPENDENT_AMBULATORY_CARE_PROVIDER_SITE_OTHER): Payer: Medicare Other | Admitting: Family Medicine

## 2019-10-17 VITALS — BP 140/77 | HR 56 | Temp 97.1°F | Ht 60.0 in | Wt 111.6 lb

## 2019-10-17 DIAGNOSIS — L89519 Pressure ulcer of right ankle, unspecified stage: Secondary | ICD-10-CM | POA: Diagnosis not present

## 2019-10-17 DIAGNOSIS — E039 Hypothyroidism, unspecified: Secondary | ICD-10-CM | POA: Diagnosis not present

## 2019-10-17 DIAGNOSIS — I129 Hypertensive chronic kidney disease with stage 1 through stage 4 chronic kidney disease, or unspecified chronic kidney disease: Secondary | ICD-10-CM | POA: Diagnosis not present

## 2019-10-17 DIAGNOSIS — M810 Age-related osteoporosis without current pathological fracture: Secondary | ICD-10-CM

## 2019-10-17 DIAGNOSIS — N183 Chronic kidney disease, stage 3 unspecified: Secondary | ICD-10-CM

## 2019-10-17 DIAGNOSIS — L309 Dermatitis, unspecified: Secondary | ICD-10-CM

## 2019-10-17 MED ORDER — MOMETASONE FUROATE 0.1 % EX CREA: TOPICAL_CREAM | CUTANEOUS | 0 refills | Status: AC

## 2019-10-17 NOTE — Progress Notes (Addendum)
Subjective: CC: Hypertension, hypothyroidism, osteoporosis PCP: [name], DO XYI:AXKP S Janice Reed is a 86 y.o. female presenting to clinic today for:  1.  Hypertension Patient reports compliance with Maxide 37.5-25 mg daily.  No chest pain, shortness of breath, falls.  She does report unsteady gait at baseline and attributes this to her macular degeneration.  She is under the care of ophthalmology for interval injections with Eyelia.  Next follow-up is in 7 weeks.  2.  Osteoporosis Patient reports compliance with her Actonel every week.  No falls.  She takes vitamin D-calcium.  She feels like she has a good appetite and has been gaining weight.  3.  Hypothyroidism Patient reports compliance with her Synthroid 100 mcg daily.  No change in voice, difficulty swallowing, tremor, unplanned weight loss.  4.  Skin lesion Patient reports several history of a right lateral ankle skin lesion.  She thought it was a pressure sore but has never shown it to anyone.  Denies any drainage.  She does report some discomfort over the lesion.  Denies leaning against that area much.  She does have a dermatologist but has not ever shown this to him.  She has a similar lesion on the joint of her right great toe.  She also notes several patches on her back that are dry and itchy.  She moisturizes and in fact has someone help her 5 hours/day.  However, the lesions are not improving.   ROS: Per HPI  Allergies  Allergen Reactions  . Ciprofloxacin Anaphylaxis  . Benicar [Olmesartan Medoxomil] Other (See Comments)    Pt doesn't remember reaction  . Clarithromycin Other (See Comments)    Pt doesn't remember reaction  . Sulfa Antibiotics Other (See Comments)    Dr told patient not to take any more  . Alendronate Sodium Other (See Comments)    Upset stomach  . Celebrex [Celecoxib] Other (See Comments)    Dr said that it was too strong for her  . Penicillins Rash   Past Medical History:  Diagnosis  Date  . Arthritis   . Arthritis pain   . Benign hypertensive heart disease   . Breast cancer ([place])   . Cancer of right breast ([place]) 09/28/2011   IDC;  T2, N0 (IHC+);   ER+;  Her-2 neg.,  Right PM,SLN  09/08/08   . Collagenous colitis   . Diverticulosis of colon   . External hemorrhoid   . Hearing loss   . Hypertension   . IBS (irritable bowel syndrome)   . Macular degeneration   . Macular degeneration of both eyes   . Neuropathy, peripheral   . Nonspecific abnormal electrocardiogram (ECG) (EKG)   . Osteoporosis   . Other and unspecified hyperlipidemia   . Palpitations   . Phlebitis and thrombophlebitis of unspecified site   . Prolapse of vaginal walls without mention of uterine prolapse   . Shingles   . Symptomatic menopausal or female climacteric states   . Unspecified hypothyroidism     Current Outpatient Medications:  .  aspirin 325 MG tablet, Take 325 mg by mouth See admin instructions. Take one tablet a day and more if needed for pain - up to 3 times daily, Disp: , Rfl:  .  bevacizumab (AVASTIN) 1.25 mg/0.1 mL SOLN, 1.25 mg by Intravitreal route. , Disp: , Rfl:  .  Calcium-Magnesium-Vitamin D (CITRACAL CALCIUM+D PO), Take 2 capsules by mouth 2 (two) times daily.  , Disp: , Rfl:  .  Cholecalciferol (VITAMIN  D3) 1000 UNITS CAPS, Take 1,000 Units by mouth 2 (two) times daily. , Disp: , Rfl:  .  levothyroxine (SYNTHROID) 100 MCG tablet, Take 1 tablet (100 mcg total) by mouth daily., Disp: 90 tablet, Rfl: 2 .  Omega-3 Fatty Acids (FISH OIL) 1000 MG CAPS, Take 1,000 mg by mouth 2 (two) times daily. , Disp: , Rfl:  .  risedronate (ACTONEL) 35 MG tablet, TAKE 1 TABLET BY MOUTH  EVERY 7 DAYS WITH WATER ON  EMPTY STOMACH, NOTHING BY  MOUTH OR LIE DOWN FOR NEXT  30 MINUTES., Disp: 12 tablet, Rfl: 3 .  triamterene-hydrochlorothiazide (MAXZIDE-25) 37.5-25 MG tablet, TAKE ONE-HALF TABLET BY  MOUTH DAILY, Disp: 45 tablet, Rfl: 0 Social History   Socioeconomic History  . Marital status:  Widowed    Spouse name: Not on file  . Number of children: 2  . Years of education: 36  . Highest education level: 12th grade  Occupational History  . Occupation: retired-homemaker  Tobacco Use  . Smoking status: Never Smoker  . Smokeless tobacco: Never Used  Substance and Sexual Activity  . Alcohol use: No  . Drug use: No  . Sexual activity: Not Currently  Other Topics Concern  . 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 No alcohol or caffeine or tobacco.   Social Determinants of Health   Financial Resource Strain:   . Difficulty of Paying Living Expenses:   Food Insecurity:   . Worried About Charity fundraiser in the Last Year:   . Arboriculturist in the Last Year:   Transportation Needs:   . Film/video editor (Medical):   [name] Kitchen Lack of Transportation (Non-Medical):   Physical Activity: Inactive  . Days of Exercise per Week: 0 days  . Minutes of Exercise per Session: 0 min  Stress: No Stress Concern Present  . Feeling of Stress : Only a little  Social Connections: Somewhat Isolated  . Frequency of Communication with Friends and Family: More than three times a week  . Frequency of Social Gatherings with Friends and Family: More than three times a week  . Attends Religious Services: More than 4 times per year  . Active Member of Clubs or Organizations: No  . Attends Archivist Meetings: Never  . Marital Status: Widowed  Intimate Partner Violence: Not At Risk  . Fear of Current or Ex-Partner: No  . Emotionally Abused: No  . Physically Abused: No  . Sexually Abused: No   Family History  Problem Relation Age of Onset  . Heart disease Mother        Heart failure per medical history form dated 09/27/09.  [name] Kitchen Heart disease Father        Heart attack per medical history form dated 09/27/09.  [name] Kitchen Heart attack Father   . Heart disease Brother        Heart failure per medical history form dated 09/27/09.  . Stroke  Brother   . Stroke Brother   . Leukemia Brother   . Breast cancer Cousin   . Healthy Daughter   . Healthy Son   . Colon cancer Neg Hx     Objective: Office vital signs reviewed. BP 140/77   Pulse (!) 56   Temp (!) 97.1 F (36.2 C) (Temporal)   Ht 5' (1.524 m)   Wt 111 lb 9.6 oz (50.6 kg)   SpO2 95%   BMI 21.80 kg/m   Physical  Examination:  General: Awake, alert, well appearing elderly female, No acute distress HEENT: Normal; no exophthalmos.  No goiter Cardio: regular rate and rhythm, S1S2 heard, no murmurs appreciated Pulm: clear to auscultation bilaterally, no wheezes, rhonchi or rales; normal work of breathing on room air Extremities: warm, well perfused, 1+ pitting edema to mid shins. No cyanosis or clubbing; +2 pulses bilaterally MSK: slow gait and hunched station; uses rolling walker for ambulation Skin: dry; she has a several, flat, dry patches noted on the back.  There is a 1 cm area of ulceration/skin breakdown along the right lateral ankle.  She has a similar lesion along the right first MTP.  No exudates, bleeding.  She has minimal surrounding erythema.  Assessment/ Plan: 86 y.o. female   1. Pressure injury of skin of right ankle, unspecified injury stage Areas were dressed in Xeroform today.  I placed a referral to dermatology given chronicity of the right ankle lesion would like to rule out a skin cancer.  I do think this seems consistent with a pressure injury but want to make sure in this elderly patient.  Referral to [facility] wound care also placed to assist with wound observation/ management at home. - CBC - Ambulatory referral to Dermatology  2. Benign hypertension with chronic kidney disease, stage III (HCC) Controlled for age - Basic Metabolic Panel  3. Age-related osteoporosis without current pathological fracture Check vitamin D.  Plan for bone density scan - VITAMIN D 25 Hydroxy (Vit-D Deficiency, Fractures) - Basic Metabolic Panel  4. Hypothyroidism,  unspecified type Asymptomatic - Thyroid Panel With TSH  5. Dermatitis Mometasone cream prescribed.  Continue current skin regimen.  Advised to use only for 7 to 10 days and avoid face, groin and axilla   Orders Placed This Encounter  Procedures  . Thyroid Panel With TSH  . CBC  . VITAMIN D 25 Hydroxy (Vit-D Deficiency, Fractures)  . Basic Metabolic Panel  . Ambulatory referral to Dermatology    Referral Priority:   Routine    Referral Type:   Consultation    Referral Reason:   Specialty Services Required    Requested Specialty:   Dermatology    Number of Visits Requested:   1  . Ambulatory referral to Home Health    Referral Priority:   Routine    Referral Type:   Home Health Care    Referral Reason:   Specialty Services Required    Requested Specialty:   [place]    Number of Visits Requested:   1   Meds ordered this encounter  Medications  . mometasone (ELOCON) 0.1 % cream    Sig: Apply thin layer to dry, itchy patches on back ONCE daily x7-10days.    Dispense:  45 g    Refill:  [place], DO [place] [phone]

## 2019-10-17 NOTE — Patient Instructions (Addendum)
You had labs performed today.  You will be contacted with the results of the labs once they are available, usually in the next 3 business days for routine lab work.  If you have an active my chart account, they will be released to your MyChart.  If you prefer to have these labs released to you via telephone, please let us know.  You were given dressings for the sores on your feet.  Use these daily.  Keep legs elevated.  Referral placed to Dr Denna Haggard.    Melissa will call you about your bone density appointment  Edema  Edema is an abnormal buildup of fluids in the body tissues and under the skin. Swelling of the legs, feet, and ankles is a common symptom that becomes more likely as you get older. Swelling is also common in looser tissues, like around the eyes. When the affected area is squeezed, the fluid may move out of that spot and leave a dent for a few moments. This dent is called pitting edema. There are many possible causes of edema. Eating too much salt (sodium) and being on your feet or sitting for a long time can cause edema in your legs, feet, and ankles. Hot weather may make edema worse. Common causes of edema include:  Heart failure.  Liver or kidney disease.  Weak leg blood vessels.  Cancer.  An injury.  Pregnancy.  Medicines.  Being obese.  Low protein levels in the blood. Edema is usually painless. Your skin may look swollen or shiny. Follow these instructions at home:  Keep the affected body part raised (elevated) above the level of your heart when you are sitting or lying down.  Do not sit still or stand for long periods of time.  Do not wear tight clothing. Do not wear garters on your upper legs.  Exercise your legs to get your circulation going. This helps to move the fluid back into your blood vessels, and it may help the swelling go down.  Wear elastic bandages or support stockings to reduce swelling as told by your health care provider.  Eat a  low-salt (low-sodium) diet to reduce fluid as told by your health care provider.  Depending on the cause of your swelling, you may need to limit how much fluid you drink (fluid restriction).  Take over-the-counter and prescription medicines only as told by your health care provider. Contact a health care provider if:  Your edema does not get better with treatment.  You have heart, liver, or kidney disease and have symptoms of edema.  You have sudden and unexplained weight gain. Get help right away if:  You develop shortness of breath or chest pain.  You cannot breathe when you lie down.  You develop pain, redness, or warmth in the swollen areas.  You have heart, liver, or kidney disease and suddenly get edema.  You have a fever and your symptoms suddenly get worse. Summary  Edema is an abnormal buildup of fluids in the body tissues and under the skin.  Eating too much salt (sodium) and being on your feet or sitting for a long time can cause edema in your legs, feet, and ankles.  Keep the affected body part raised (elevated) above the level of your heart when you are sitting or lying down. This information is not intended to replace advice given to you by your health care provider. Make sure you discuss any questions you have with your health care provider. Document Revised: 12/11/2018 Document  Reviewed: 08/26/2016 Elsevier Patient Education  [employer].

## 2019-10-18 LAB — BASIC METABOLIC PANEL
BUN/Creatinine Ratio: 19 (ref 12–28)
BUN: 18 mg/dL (ref 10–36)
CO2: 26 mmol/L (ref 20–29)
Calcium: 10.2 mg/dL (ref 8.7–10.3)
Chloride: 98 mmol/L (ref 96–106)
Creatinine, Ser: 0.97 mg/dL (ref 0.57–1.00)
GFR calc Af Amer: 57 mL/min/{1.73_m2} — ABNORMAL LOW (ref >59)
GFR calc non Af Amer: 50 mL/min/{1.73_m2} — ABNORMAL LOW (ref >59)
Glucose: 78 mg/dL (ref 65–99)
Potassium: 4.6 mmol/L (ref 3.5–5.2)
Sodium: 139 mmol/L (ref 134–144)

## 2019-10-18 LAB — CBC
Hematocrit: 44 % (ref 34.0–46.6)
Hemoglobin: 14.7 g/dL (ref 11.1–15.9)
MCH: 29.9 pg (ref 26.6–33.0)
MCHC: 33.4 g/dL (ref 31.5–35.7)
MCV: 90 fL (ref 79–97)
Platelets: 204 10*3/uL (ref 150–450)
RBC: 4.91 x10E6/uL (ref 3.77–5.28)
RDW: 13 % (ref 11.7–15.4)
WBC: 6.5 10*3/uL (ref 3.4–10.8)

## 2019-10-18 LAB — THYROID PANEL WITH TSH
Free Thyroxine Index: 2.3 (ref 1.2–4.9)
T3 Uptake Ratio: 25 % (ref 24–39)
T4, Total: 9 ug/dL (ref 4.5–12.0)
TSH: 7.4 u[IU]/mL — ABNORMAL HIGH (ref 0.450–4.500)

## 2019-10-18 LAB — VITAMIN D 25 HYDROXY (VIT D DEFICIENCY, FRACTURES): Vit D, 25-Hydroxy: 43.9 ng/mL (ref 30.0–100.0)

## 2019-10-23 ENCOUNTER — Telehealth: Payer: Self-pay | Admitting: Family Medicine

## 2019-10-23 NOTE — Telephone Encounter (Signed)
Patient aware her Ref was Sent to [facility] - Stated she will call and make her own appt.

## 2019-10-23 NOTE — Telephone Encounter (Signed)
Patient made aware of Vit D, Kidney function and TSH levels. She knows that Dr. Lajuana Ripple would like for her to increase her Synthroid to 112 mcg and to evaluate again in 2 months. The patient wishes to speak to Dr. Lajuana Ripple before increasing the medication. She said just a phone call would be fine. Pt has an in office follow up scheduled in July.

## 2019-10-24 ENCOUNTER — Other Ambulatory Visit: Payer: Self-pay | Admitting: Family Medicine

## 2019-10-24 DIAGNOSIS — R7989 Other specified abnormal findings of blood chemistry: Secondary | ICD-10-CM

## 2019-10-24 NOTE — Telephone Encounter (Signed)
I spoke to patient on phone about her TSH.  She would like to wait on increasing synthorid.  Will come in for repeat lab in 4 weeks

## 2019-10-29 ENCOUNTER — Telehealth: Payer: Self-pay | Admitting: Family Medicine

## 2019-10-29 NOTE — Telephone Encounter (Signed)
I addended her 3/12 note

## 2019-10-29 NOTE — Addendum Note (Signed)
Addended by: [name] on: 10/29/2019 05:27 PM   Modules accepted: Orders

## 2019-10-29 NOTE — Telephone Encounter (Signed)
I think so.  Janice Reed, if I addend my last note, do you think HH will see her for wound care?

## 2019-10-29 NOTE — Telephone Encounter (Signed)
Please advise 

## 2019-10-29 NOTE — Telephone Encounter (Signed)
Can we order this?

## 2019-10-29 NOTE — Telephone Encounter (Signed)
We can try, it has been hard recently to get [facility] d/t staffing

## 2019-10-30 NOTE — Telephone Encounter (Signed)
Aware sent to [facility]

## 2019-11-10 ENCOUNTER — Other Ambulatory Visit: Payer: Self-pay

## 2019-11-10 ENCOUNTER — Ambulatory Visit (INDEPENDENT_AMBULATORY_CARE_PROVIDER_SITE_OTHER): Payer: Medicare Other

## 2019-11-10 DIAGNOSIS — E039 Hypothyroidism, unspecified: Secondary | ICD-10-CM

## 2019-11-10 DIAGNOSIS — G9009 Other idiopathic peripheral autonomic neuropathy: Secondary | ICD-10-CM

## 2019-11-10 DIAGNOSIS — I129 Hypertensive chronic kidney disease with stage 1 through stage 4 chronic kidney disease, or unspecified chronic kidney disease: Secondary | ICD-10-CM | POA: Diagnosis not present

## 2019-11-10 DIAGNOSIS — S90411D Abrasion, right great toe, subsequent encounter: Secondary | ICD-10-CM

## 2019-11-10 DIAGNOSIS — R2681 Unsteadiness on feet: Secondary | ICD-10-CM

## 2019-11-10 DIAGNOSIS — N183 Chronic kidney disease, stage 3 unspecified: Secondary | ICD-10-CM

## 2019-11-10 DIAGNOSIS — M810 Age-related osteoporosis without current pathological fracture: Secondary | ICD-10-CM

## 2019-11-10 DIAGNOSIS — L89510 Pressure ulcer of right ankle, unstageable: Secondary | ICD-10-CM | POA: Diagnosis not present

## 2019-11-10 DIAGNOSIS — H353131 Nonexudative age-related macular degeneration, bilateral, early dry stage: Secondary | ICD-10-CM

## 2019-11-20 ENCOUNTER — Telehealth: Payer: Self-pay | Admitting: Family Medicine

## 2019-11-20 NOTE — Chronic Care Management (AMB) (Signed)
  Chronic Care Management   Note  11/20/2019 Name: ALANDRIA BUTKIEWICZ MRN: [id number] DOB: Dec 29, 1921  OKTOBER GLAZER is a 86 y.o. year old female who is a primary care patient of [name], DO. I reached out to Roena Malady by phone today in response to a referral sent by Ms. Zenia Resides Foor's health plan.     Ms. Stainback was given information about Chronic Care Management services today including:  1. CCM service includes personalized support from designated clinical staff supervised by her physician, including individualized plan of care and coordination with other care providers 2. 24/7 contact phone numbers for assistance for urgent and routine care needs. 3. Service will only be billed when office clinical staff spend 20 minutes or more in a month to coordinate care. 4. Only one practitioner may furnish and bill the service in a calendar month. 5. The patient may stop CCM services at any time (effective at the end of the month) by phone call to the office staff. 6. The patient will be responsible for cost sharing (co-pay) of up to 20% of the service fee (after annual deductible is met).  Patient agreed to services and verbal consent obtained.   Follow up plan: Telephone appointment with care management team member scheduled for: 12/25/2019  [place], [place] [address] Direct Dial: [email or web address] Website: [email or web address]

## 2019-11-25 ENCOUNTER — Ambulatory Visit: Payer: Medicare Other | Admitting: Dermatology

## 2019-12-08 ENCOUNTER — Telehealth: Payer: Self-pay | Admitting: Family Medicine

## 2019-12-08 NOTE — Telephone Encounter (Signed)
Attempted to contact patient - NA °

## 2019-12-11 ENCOUNTER — Ambulatory Visit (INDEPENDENT_AMBULATORY_CARE_PROVIDER_SITE_OTHER): Payer: Medicare Other

## 2019-12-11 DIAGNOSIS — Z0000 Encounter for general adult medical examination without abnormal findings: Secondary | ICD-10-CM | POA: Diagnosis not present

## 2019-12-11 NOTE — Progress Notes (Signed)
MEDICARE ANNUAL WELLNESS VISIT  12/11/2019  Telephone Visit Disclaimer This Medicare AWV was conducted by telephone due to national recommendations for restrictions regarding the COVID-19 Pandemic (e.g. social distancing).  I verified, using two identifiers, that I am speaking with Janice Reed or their authorized healthcare agent. I discussed the limitations, risks, security, and privacy concerns of performing an evaluation and management service by telephone and the potential availability of an in-person appointment in the future. The patient expressed understanding and agreed to proceed.   Subjective:  Janice Reed is a 86 y.o. female patient of [name], DO who had a Medicare Annual Wellness Visit today via telephone. Janice Reed is Retired and lives alone. Shehas two children. She reports that she is socially active and does interact with friends/family regularly. She is minimally physically active and enjoys going to church and spending time with family.  Patient Care Team: [name], DO as PCP - General (Family Medicine) Ralene Muskrat as Physician Assistant (Chiropractic Medicine) Sherlynn Stalls, MD as Consulting Physician (Ophthalmology) Shea Evans [name], LCSW as [place] [facility] (Licensed Clinical Social Worker)  Advanced Directives 12/11/2019 12/10/2018 12/03/2017 12/05/2016 11/29/2016 11/09/2015 10/08/2015  Does Patient Have a Medical Advance Directive? No Yes Yes Yes Yes No Yes  Type of Advance Directive - [place];Living will [place];Living will [place];Living will [place];Living will - -  Does patient want to make changes to medical advance directive? - No - Guardian declined No - Patient declined - - - -  Copy of [place] in Chart? - No - copy requested No - copy requested - No - copy requested - No - copy requested  Would patient like  information on creating a medical advance directive? No - Patient declined - - - - No - patient declined information -    Hospital Utilization Over the Past 12 Months: # of hospitalizations or ER visits: 0 # of surgeries: 0  Review of Systems    Patient reports that her overall health is unchanged compared to last year.  History obtained from chart review and the patient  Patient Reported Readings (BP, Pulse, CBG, Weight, etc) none  Pain Assessment Pain : No/denies pain     Current Medications & Allergies (verified) Allergies as of 12/11/2019      Reactions   Ciprofloxacin Anaphylaxis   Benicar [olmesartan Medoxomil] Other (See Comments)   Pt doesn't remember reaction   Clarithromycin Other (See Comments)   Pt doesn't remember reaction   Sulfa Antibiotics Other (See Comments)   Dr told patient not to take any more   Alendronate Sodium Other (See Comments)   Upset stomach   Celebrex [celecoxib] Other (See Comments)   Dr said that it was too strong for her   Penicillins Rash      Medication List       Accurate as of Dec 11, 2019  2:11 PM. If you have any questions, ask your nurse or doctor.        aspirin 325 MG tablet Take 325 mg by mouth See admin instructions. Take one tablet a day and more if needed for pain - up to 3 times daily   bevacizumab 1.25 mg/0.1 mL Soln Commonly known as: AVASTIN 1.25 mg by Intravitreal route.   CITRACAL CALCIUM+D PO Take 2 capsules by mouth 2 (two) times daily.   Fish Oil 1000 MG Caps Take 1,000  mg by mouth 2 (two) times daily.   levothyroxine 100 MCG tablet Commonly known as: SYNTHROID Take 1 tablet (100 mcg total) by mouth daily.   mometasone 0.1 % cream Commonly known as: Elocon Apply thin layer to dry, itchy patches on back ONCE daily x7-10days.   risedronate 35 MG tablet Commonly known as: ACTONEL TAKE 1 TABLET BY MOUTH  EVERY 7 DAYS WITH WATER ON  EMPTY STOMACH, NOTHING BY  MOUTH OR LIE DOWN FOR NEXT  30 MINUTES.     triamterene-hydrochlorothiazide 37.5-25 MG tablet Commonly known as: MAXZIDE-25 TAKE ONE-HALF TABLET BY  MOUTH DAILY   Vitamin D3 25 MCG (1000 UT) Caps Take 1,000 Units by mouth 2 (two) times daily.       History (reviewed): Past Medical History:  Diagnosis Date  . Arthritis   . Arthritis pain   . Benign hypertensive heart disease   . Breast cancer ([place])   . Cancer of right breast ([place]) 09/28/2011   IDC;  T2, N0 (I[facility]+);   ER+;  Her-2 neg.,  Right PM,SLN  09/08/08   . Collagenous colitis   . Diverticulosis of colon   . External hemorrhoid   . Hearing loss   . Hypertension   . IBS (irritable bowel syndrome)   . Macular degeneration   . Macular degeneration of both eyes   . Neuropathy, peripheral   . Nonspecific abnormal electrocardiogram (ECG) (EKG)   . Osteoporosis   . Other and unspecified hyperlipidemia   . Palpitations   . Phlebitis and thrombophlebitis of unspecified site   . Prolapse of vaginal walls without mention of uterine prolapse   . Shingles   . Symptomatic menopausal or female climacteric states   . Unspecified hypothyroidism    Past Surgical History:  Procedure Laterality Date  . BACK SURGERY    . BREAST LUMPECTOMY     per medical history form dated 09/27/09.  [name] Kitchen CARPAL TUNNEL RELEASE     bilateral  . COLONOSCOPY  12/03/2002   Dr. Silvano Rusk  . NEUROPLASTY / TRANSPOSITION MEDIAN NERVE AT CARPAL TUNNEL BILATERAL    . TONSILLECTOMY AND ADENOIDECTOMY    . VAGINAL HYSTERECTOMY     Family History  Problem Relation Age of Onset  . Heart disease Mother        Heart failure per medical history form dated 09/27/09.  [name] Kitchen Heart disease Father        Heart attack per medical history form dated 09/27/09.  [name] Kitchen Heart attack Father   . Heart disease Brother        Heart failure per medical history form dated 09/27/09.  . Stroke Brother   . Stroke Brother   . Leukemia Brother   . Breast cancer Cousin   . Healthy Daughter   . Healthy Son   . Colon cancer Neg Hx     Social History   Socioeconomic History  . Marital status: Widowed    Spouse name: Not on file  . Number of children: 2  . Years of education: 72  . Highest education level: 12th grade  Occupational History  . Occupation: retired-homemaker  Tobacco Use  . Smoking status: Never Smoker  . Smokeless tobacco: Never Used  Substance and Sexual Activity  . Alcohol use: No  . Drug use: No  . Sexual activity: Not Currently  Other Topics Concern  . 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 No alcohol or caffeine or tobacco.  Social Determinants of Health   Financial Resource Strain:   . Difficulty of Paying Living Expenses:   Food Insecurity:   . Worried About Charity fundraiser in the Last Year:   . Arboriculturist in the Last Year:   Transportation Needs:   . Film/video editor (Medical):   [name] Kitchen Lack of Transportation (Non-Medical):   Physical Activity:   . Days of Exercise per Week:   . Minutes of Exercise per Session:   Stress:   . Feeling of Stress :   Social Connections:   . Frequency of Communication with Friends and Family:   . Frequency of Social Gatherings with Friends and Family:   . Attends Religious Services:   . Active Member of Clubs or Organizations:   . Attends Archivist Meetings:   [name] Kitchen Marital Status:     Activities of Daily Living In your present state of health, do you have any difficulty performing the following activities: 12/11/2019  Hearing? Y  Comment wears hearing aids in both ears  Vision? Y  Comment macular degeneration  Difficulty concentrating or making decisions? N  Walking or climbing stairs? Y  Comment slower than she used to be and must use a walker  Dressing or bathing? N  Doing errands, shopping? Y  Comment does not go anywhere by herself, has friends or family go with her  Preparing Food and eating ? N  Using the Toilet? N  In the past six months, have you accidently  leaked urine? N  Do you have problems with loss of bowel control? N  Managing your Medications? N  Managing your Finances? N  Housekeeping or managing your Housekeeping? Y  Comment has someone coming in who helps around the house  Some recent data might be hidden    Patient Education/ Literacy How often do you need to have someone help you when you read instructions, pamphlets, or other written materials from your doctor or pharmacy?: 1 - Never What is the last grade level you completed in school?: 12th  Exercise Current Exercise Habits: The patient does not participate in regular exercise at present, Exercise limited by: Other - see comments(age)  Diet Patient reports consuming 3 meals a day and 1 snack(s) a day Patient reports that her primary diet is: Regular Patient reports that she does have regular access to food.   Depression Screen PHQ 2/9 Scores 12/11/2019 06/17/2019 05/09/2019 12/10/2018 08/20/2018 03/28/2018 11/28/2017  PHQ - 2 Score 0 1 2 0 0 1 1  PHQ- 9 Score - - 2 - - - -     Fall Risk Fall Risk  12/11/2019 10/17/2019 06/17/2019 05/09/2019 12/10/2018  Falls in the past year? 0 0 0 0 0  Number falls in past yr: - - - - -  Injury with Fall? - - - - -  Comment - - - - -  Risk Factor Category  - - - - -  Risk for fall due to : - - - - -  Follow up - - - - Education provided;Falls prevention discussed     Objective:  Janice Reed seemed alert and oriented and she participated appropriately during our telephone visit.  Blood Pressure Weight BMI  BP Readings from Last 3 Encounters:  10/17/19 140/77  06/17/19 137/71  05/09/19 140/68   Wt Readings from Last 3 Encounters:  10/17/19 111 lb 9.6 oz (50.6 kg)  06/17/19 108 lb (49 kg)  05/09/19 108 lb (49 kg)  BMI Readings from Last 1 Encounters:  10/17/19 21.80 kg/m    *Unable to obtain current vital signs, weight, and BMI due to telephone visit type  Hearing/Vision  . Janice Reed did not seem to have difficulty with  hearing/understanding during the telephone conversation . Reports that she has had a formal eye exam by an eye care professional within the past year . Reports that she has had a formal hearing evaluation within the past year *Unable to fully assess hearing and vision during telephone visit type  Cognitive Function: 6CIT Screen 12/11/2019 12/10/2018  What Year? 0 points 0 points  What month? 0 points 0 points  What time? 0 points 0 points  Count back from 20 0 points 0 points  Months in reverse 0 points 0 points  Repeat phrase 0 points 0 points  Total Score 0 0   (Normal:0-7, Significant for Dysfunction: >8)  Normal Cognitive Function Screening: Yes   Immunization & Health Maintenance Record Immunization History  Administered Date(s) Administered  . Fluad Quad(high Dose 65+) 05/09/2019  . Influenza Whole 04/07/2010  . Influenza, High Dose Seasonal PF 06/14/2016, 05/29/2017, 05/20/2018  . Influenza,inj,Quad PF,6+ Mos 06/04/2013, 05/22/2014, 05/31/2015  . Pneumococcal Conjugate-13 06/04/2013  . Pneumococcal Polysaccharide-23 08/07/1998  . Td 02/05/2007    Health Maintenance  Topic Date Due  . COVID-19 Vaccine (1) Never done  . DEXA SCAN  11/10/2018  . TETANUS/TDAP  06/16/2020 (Originally 02/04/2017)  . INFLUENZA VACCINE  03/07/2020  . PNA vac Low Risk Adult  Completed       Assessment  This is a routine wellness examination for Janice Reed.  Health Maintenance: Due or Overdue Health Maintenance Due  Topic Date Due  . COVID-19 Vaccine (1) Never done  . DEXA SCAN  11/10/2018    Janice Reed does not need a referral for Community Assistance: Care Management:   no Social Work:    no Prescription Assistance:  no Nutrition/Diabetes Education:  no   Plan:  Personalized Goals Goals Addressed            This Visit's Progress   . Patient Stated       12/11/2019 AWV Goal: Fall Prevention  . Over the next year, patient will decrease their risk for falls by: o Using  assistive devices, such as a cane or walker, as needed o Identifying fall risks within their home and correcting them by: - Removing throw rugs - Adding handrails to stairs or ramps - Removing clutter and keeping a clear pathway throughout the home - Increasing light, especially at night - Adding shower handles/bars - Raising toilet seat o Identifying potential personal risk factors for falls: - Medication side effects - Incontinence/urgency - Vestibular dysfunction - Hearing loss - Musculoskeletal disorders - Neurological disorders - Orthostatic hypotension        Personalized Health Maintenance & Screening Recommendations  Bone densitometry screening  Lung Cancer Screening Recommended: no (Low Dose CT Chest recommended if Age 66-80 years, 30 pack-year currently smoking OR have quit w/in past 15 years) Hepatitis C Screening recommended: no HIV Screening recommended: no  Advanced Directives: Written information was not prepared per patient's request.  Referrals & Orders No orders of the defined types were placed in this encounter.   Follow-up Plan . Follow-up with [name], DO as planned    Patient declined after visit summary.   I have personally reviewed and noted the following in the patient's chart:   . Medical and social history . Use  of alcohol, tobacco or illicit drugs  . Current medications and supplements . Functional ability and status . Nutritional status . Physical activity . Advanced directives . List of other physicians . Hospitalizations, surgeries, and ER visits in previous 12 months . Vitals . Screenings to include cognitive, depression, and falls . Referrals and appointments  In addition, I have reviewed and discussed with Janice Reed certain preventive protocols, quality metrics, and best practice recommendations. A written personalized care plan for preventive services as well as general preventive health recommendations is  available and can be mailed to the patient at her request.      Felicity Coyer, LPN    12/10/8125

## 2019-12-25 ENCOUNTER — Ambulatory Visit (INDEPENDENT_AMBULATORY_CARE_PROVIDER_SITE_OTHER): Payer: Medicare Other | Admitting: Licensed Clinical Social Worker

## 2019-12-25 DIAGNOSIS — I129 Hypertensive chronic kidney disease with stage 1 through stage 4 chronic kidney disease, or unspecified chronic kidney disease: Secondary | ICD-10-CM

## 2019-12-25 DIAGNOSIS — M810 Age-related osteoporosis without current pathological fracture: Secondary | ICD-10-CM

## 2019-12-25 DIAGNOSIS — E039 Hypothyroidism, unspecified: Secondary | ICD-10-CM | POA: Diagnosis not present

## 2019-12-25 DIAGNOSIS — E782 Mixed hyperlipidemia: Secondary | ICD-10-CM | POA: Diagnosis not present

## 2019-12-25 DIAGNOSIS — N183 Chronic kidney disease, stage 3 unspecified: Secondary | ICD-10-CM | POA: Diagnosis not present

## 2019-12-25 NOTE — Chronic Care Management (AMB) (Signed)
Chronic Care Management    Clinical Social Work Follow Up Note  12/25/2019 Name: Janice Reed MRN: [id number] DOB: [birth year]  Janice Reed is a 86 y.o. year old female who is a primary care patient of [name], DO. The CCM team was consulted for assistance with [name] .   Review of patient status, including review of consultants reports, other relevant assessments, and collaboration with appropriate care team members and the patient's provider was performed as part of comprehensive patient evaluation and provision of chronic care management services.    SDOH (Social Determinants of Health) assessments performed: Yes; risk for tobacco use;risk for depression  SDOH Interventions     Most Recent Value  SDOH Interventions  Depression Interventions/Treatment   -- [spoke with client about RNCM support and LCSW support]        Chronic Care Management from 12/25/2019 in [place]  PHQ-9 Total Score  5     GAD 7 : Generalized Anxiety Score 12/25/2019  Nervous, Anxious, on Edge 1  Control/stop worrying 1  Worry too much - different things 0  Trouble relaxing 0  Restless 0  Easily annoyed or irritable 0  Afraid - awful might happen 0  Total GAD 7 Score 2  Anxiety Difficulty Somewhat difficult   Outpatient Encounter Medications as of 12/25/2019  Medication Sig Note  . aspirin 325 MG tablet Take 325 mg by mouth See admin instructions. Take one tablet a day and more if needed for pain - up to 3 times daily   . bevacizumab (AVASTIN) 1.25 mg/0.1 mL SOLN 1.25 mg by Intravitreal route.  06/17/2019: Dr Baird Cancer ( eye specialist )   . Calcium-Magnesium-Vitamin D (CITRACAL CALCIUM+D PO) Take 2 capsules by mouth 2 (two) times daily.     . Cholecalciferol (VITAMIN D3) 1000 UNITS CAPS Take 1,000 Units by mouth 2 (two) times daily.    [name] Kitchen levothyroxine (SYNTHROID) 100 MCG tablet Take 1 tablet (100 mcg total) by mouth daily.   . mometasone (ELOCON) 0.1 %  cream Apply thin layer to dry, itchy patches on back ONCE daily x7-10days.   . Omega-3 Fatty Acids (FISH OIL) 1000 MG CAPS Take 1,000 mg by mouth 2 (two) times daily.    . risedronate (ACTONEL) 35 MG tablet TAKE 1 TABLET BY MOUTH  EVERY 7 DAYS WITH WATER ON  EMPTY STOMACH, NOTHING BY  MOUTH OR LIE DOWN FOR NEXT  30 MINUTES.   [name] Kitchen triamterene-hydrochlorothiazide (MAXZIDE-25) 37.5-25 MG tablet TAKE ONE-HALF TABLET BY  MOUTH DAILY    No facility-administered encounter medications on file as of 12/25/2019.    Goals Addressed            This Visit's Progress   . Client will talk with LCSW in next 30 days about social work needs of client (pt-stated)       CARE PLAN ENTRY   Current Barriers:  . Vision challenges in client with Chronic Diagnoses of HLD, Hypothoyroidism, Osteoporosis, IBS HTN, Macular Degeneration . Hearing challenges  Clinical Social Work Clinical Goal(s):  [name] Kitchen LCSW will call client in next 30 days to discuss social work needs of client  Interventions: . Talked with client about CCM program support . Talked with client about skin care issues of client (she said she has a home health nurse coming in weekly for skin care) . Talked with client about vision challenges of client . Talked with client about home health with [facility] (physical therapy sessions in home  as scheduled) . Talked with client about pain issues of client . Talked with client about fall history of client . Talked with client about CAP support in home . Talked with client about transport needs of client . Talked with client about food procurement for client . Talked with client about ADLs completion of client  Patient Self Care Activities:   Attends scheduled medical appointments  Patient Self Care Deficits:   Hearing issues Vision hallenges  Initial goal documentation       Follow Up Plan: LCSW to call client in next 4 weeks to talk with her about the social work needs of client at that  time  [name].[place] MSW, LCSW Licensed Clinical Social Worker [facility] [phone]

## 2019-12-25 NOTE — Patient Instructions (Signed)
Licensed Clinical Social Worker Visit Information  Goals we discussed today:  Goals Addressed            This Visit's Progress   . Client will talk with LCSW in next 30 days about social work needs of client (pt-stated)       CARE PLAN ENTRY   Current Barriers:  . Vision challenges in client with Chronic Diagnoses of HLD, Hypothoyroidism, Osteoporosis, IBS HTN, Macular Degeneration . Hearing challenges  Clinical Social Work Clinical Goal(s):  [name] Kitchen LCSW will call client in 30 days to discuss social work needs of client  Interventions: . Talked with client about CCM program support . Talked with client about skin care issues of client (she said she has a home health nurse coming in weekly for skin care) . Talked with client about vision challenges of client . Talked with client about home health with [name] (physical therapy sessions in home as scheduled) . Talked with client about pain issues of client . Talked with client about fall history of client . Talked with client about CAP support in home . Talked with client about transport needs of client . Talked with client about food procurement for client . Talked with client about ADLs completion of client  Patient Self Care Activities:   Attends scheduled medical appointments  Patient Self Care Deficits:   Hearing issues Vision hallenges   Initial goal documentation       Materials Provided: No  Follow Up Plan: LCSW will call client in next 4 weeks to talk with client about the social work needs of client at that time  The patient verbalized understanding of instructions provided today and declined a print copy of patient instruction materials.   [name].Jacquiline Zurcher MSW, LCSW Licensed Clinical Social Worker [facility] [phone]

## 2020-01-02 ENCOUNTER — Other Ambulatory Visit: Payer: Self-pay | Admitting: Family Medicine

## 2020-01-29 ENCOUNTER — Telehealth: Payer: Self-pay

## 2020-02-16 ENCOUNTER — Other Ambulatory Visit: Payer: Self-pay

## 2020-02-16 ENCOUNTER — Encounter: Payer: Self-pay | Admitting: Family Medicine

## 2020-02-16 ENCOUNTER — Ambulatory Visit (INDEPENDENT_AMBULATORY_CARE_PROVIDER_SITE_OTHER): Payer: Medicare Other | Admitting: Family Medicine

## 2020-02-16 VITALS — BP 122/71 | HR 54 | Temp 97.9°F | Ht 60.0 in | Wt 108.0 lb

## 2020-02-16 DIAGNOSIS — I129 Hypertensive chronic kidney disease with stage 1 through stage 4 chronic kidney disease, or unspecified chronic kidney disease: Secondary | ICD-10-CM | POA: Diagnosis not present

## 2020-02-16 DIAGNOSIS — E039 Hypothyroidism, unspecified: Secondary | ICD-10-CM | POA: Diagnosis not present

## 2020-02-16 DIAGNOSIS — M8949 Other hypertrophic osteoarthropathy, multiple sites: Secondary | ICD-10-CM

## 2020-02-16 DIAGNOSIS — R5381 Other malaise: Secondary | ICD-10-CM

## 2020-02-16 DIAGNOSIS — M4124 Other idiopathic scoliosis, thoracic region: Secondary | ICD-10-CM

## 2020-02-16 DIAGNOSIS — L89519 Pressure ulcer of right ankle, unspecified stage: Secondary | ICD-10-CM | POA: Diagnosis not present

## 2020-02-16 DIAGNOSIS — R600 Localized edema: Secondary | ICD-10-CM

## 2020-02-16 DIAGNOSIS — M159 Polyosteoarthritis, unspecified: Secondary | ICD-10-CM

## 2020-02-16 DIAGNOSIS — N183 Chronic kidney disease, stage 3 unspecified: Secondary | ICD-10-CM

## 2020-02-16 MED ORDER — DICLOFENAC SODIUM 1 % EX GEL: 2.0000 g | Freq: Four times a day (QID) | CUTANEOUS | 0 refills | Status: AC

## 2020-02-16 NOTE — Patient Instructions (Addendum)
   Ok to take Tylenol arthritis every 8 hours (3 times a day) for arthritis   Diclofenac gel (voltaren gel) sent to mail order.  You can use this 3-4 times daily on joints as well.  You had labs performed today.  You will be contacted with the results of the labs once they are available, usually in the next 3 business days for routine lab work.  If you have an active my chart account, they will be released to your MyChart.  If you prefer to have these labs released to you via telephone, please let us know.  If you had a pap smear or biopsy performed, expect to be contacted in about 7-10 days.

## 2020-02-16 NOTE — Progress Notes (Signed)
Subjective: CC: hypothyroidism, pressure sore, arthritis PCP: Janice Norlander, DO Janice Reed is a 86 y.o. female presenting to clinic today for:  1.  Hypothyroidism Patient is compliant with her Synthroid 100 mcg daily.  Does not report any changes in voice, difficulty swallowing, tremor or heart palpitations  2.  Pressure sore Patient was diagnosed with a pressure sore on her right lateral ankle at last visit.  She has been having home health come in weekly to help with dressings and to reassess wound.  Wound is improving.  She has been trying to offset that ankle so that she does not apply pressure.  She does note difficulty raising her legs and subsequently has lower extremity edema.  She occasionally wears compression hose but this is limited by the pressure sore.  She saw podiatry recently for nail trimming and they thought that the lesion was looking better as well.  3.  Osteoarthritis Patient reports osteoarthritis, particular in her low back.  Symptoms seem worse when she gets up from sleep in the morning and if she has been seated for a prolonged amount of time and gets up.  She has been using Tylenol regular strength twice daily with some improvement.  She uses Voltaren gel but needs refills of this as well.  She uses a walker for ambulation.  ROS: Per HPI  Allergies  Allergen Reactions  . Ciprofloxacin Anaphylaxis  . Benicar [Olmesartan Medoxomil] Other (See Comments)    Pt doesn't remember reaction  . Clarithromycin Other (See Comments)    Pt doesn't remember reaction  . Sulfa Antibiotics Other (See Comments)    Dr told patient not to take any more  . Alendronate Sodium Other (See Comments)    Upset stomach  . Celebrex [Celecoxib] Other (See Comments)    Dr said that it was too strong for her  . Penicillins Rash   Past Medical History:  Diagnosis Date  . Arthritis   . Arthritis pain   . Benign hypertensive heart disease   . Breast cancer ([place])   .  Cancer of right breast ([place]) 09/28/2011   IDC;  T2, N0 ([facility]+);   ER+;  Her-2 neg.,  Right PM,SLN  09/08/08   . Collagenous colitis   . Diverticulosis of colon   . External hemorrhoid   . Hearing loss   . Hypertension   . IBS (irritable bowel syndrome)   . Macular degeneration   . Macular degeneration of both eyes   . Neuropathy, peripheral   . Nonspecific abnormal electrocardiogram (ECG) (EKG)   . Osteoporosis   . Other and unspecified hyperlipidemia   . Palpitations   . Phlebitis and thrombophlebitis of unspecified site   . Prolapse of vaginal walls without mention of uterine prolapse   . Shingles   . Symptomatic menopausal or female climacteric states   . Unspecified hypothyroidism     Current Outpatient Medications:  .  aspirin 325 MG tablet, Take 325 mg by mouth See admin instructions. Take one tablet a day and more if needed for pain - up to 3 times daily, Disp: , Rfl:  .  bevacizumab (AVASTIN) 1.25 mg/0.1 mL SOLN, 1.25 mg by Intravitreal route. , Disp: , Rfl:  .  Calcium-Magnesium-Vitamin D (CITRACAL CALCIUM+D PO), Take 2 capsules by mouth 2 (two) times daily.  , Disp: , Rfl:  .  Cholecalciferol (VITAMIN D3) 1000 UNITS CAPS, Take 1,000 Units by mouth 2 (two) times daily. , Disp: , Rfl:  .  levothyroxine (  SYNTHROID) 100 MCG tablet, Take 1 tablet (100 mcg total) by mouth daily., Disp: 90 tablet, Rfl: 2 .  mometasone (ELOCON) 0.1 % cream, Apply thin layer to dry, itchy patches on back ONCE daily x7-10days., Disp: 45 g, Rfl: 0 .  Omega-3 Fatty Acids (FISH OIL) 1000 MG CAPS, Take 1,000 mg by mouth 2 (two) times daily. , Disp: , Rfl:  .  risedronate (ACTONEL) 35 MG tablet, TAKE 1 TABLET BY MOUTH  EVERY 7 DAYS WITH WATER ON  EMPTY STOMACH, NOTHING BY  MOUTH OR LIE DOWN FOR NEXT  30 MINUTES., Disp: 12 tablet, Rfl: 3 .  triamterene-hydrochlorothiazide (MAXZIDE-25) 37.5-25 MG tablet, TAKE ONE-HALF TABLET BY  MOUTH DAILY, Disp: 45 tablet, Rfl: 3 Social History   Socioeconomic History  .  Marital status: Widowed    Spouse name: Not on file  . Number of children: 2  . Years of education: 85  . Highest education level: 12th grade  Occupational History  . Occupation: retired-homemaker  Tobacco Use  . Smoking status: Never Smoker  . Smokeless tobacco: Never Used  Vaping Use  . Vaping Use: Never used  Substance and Sexual Activity  . Alcohol use: No  . Drug use: No  . Sexual activity: Not Currently  Other Topics Concern  . 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 No alcohol or caffeine or tobacco.   Social Determinants of Health   Financial Resource Strain:   . Difficulty of Paying Living Expenses:   Food Insecurity:   . Worried About Charity fundraiser in the Last Year:   . Arboriculturist in the Last Year:   Transportation Needs:   . Film/video editor (Medical):   [name] Kitchen Lack of Transportation (Non-Medical):   Physical Activity:   . Days of Exercise per Week:   . Minutes of Exercise per Session:   Stress:   . Feeling of Stress :   Social Connections:   . Frequency of Communication with Friends and Family:   . Frequency of Social Gatherings with Friends and Family:   . Attends Religious Services:   . Active Member of Clubs or Organizations:   . Attends Archivist Meetings:   [name] Kitchen Marital Status:   Intimate Partner Violence:   . Fear of Current or Ex-Partner:   . Emotionally Abused:   [name] Kitchen Physically Abused:   . Sexually Abused:    Family History  Problem Relation Age of Onset  . Heart disease Mother        Heart failure per medical history form dated 09/27/09.  [name] Kitchen Heart disease Father        Heart attack per medical history form dated 09/27/09.  [name] Kitchen Heart attack Father   . Heart disease Brother        Heart failure per medical history form dated 09/27/09.  . Stroke Brother   . Stroke Brother   . Leukemia Brother   . Breast cancer Cousin   . Healthy Daughter   . Healthy Son   . Colon cancer  Neg Hx     Objective: Office vital signs reviewed. BP 122/71   Pulse (!) 54   Temp 97.9 F (36.6 C) (Temporal)   Ht 5' (1.524 m)   Wt 108 lb (49 kg)   SpO2 95%   BMI 21.09 kg/m   Physical Examination:  General: Awake, alert, well appearing elderly female, No acute distress HEENT: Normal; no exophthalmos.  Cardio: regular rate and rhythm, S1S2 heard, no murmurs appreciated Pulm: clear to auscultation bilaterally, no wheezes, rhonchi or rales; normal work of breathing on room air Extremities: warm, well perfused, 1-2+ pitting edema to mid shins. No cyanosis or clubbing; +2 pulses bilaterally MSK: slow gait and hunched station; she has a scoliotic curve to the right with increased rib pumps on the right.  No midline tenderness palpation to the lumbar spine.  Uses rolling walker for ambulation Skin: Right ankle has bandage with no appreciable drainage or bleeding.  No associated erythema.  Normal temperature Neuro: No tremor  Assessment/ Plan: 86 y.o. female   1. Acquired hypothyroidism Asymptomatic.  Check thyroid panel - Thyroid Panel With TSH  2. Benign hypertension with chronic kidney disease, stage III (HCC) Continue to avoid oral NSAIDs.  Topical NSAID provided for osteoarthritis - Basic Metabolic Panel  3. Pressure injury of skin of right ankle, unspecified injury stage Improving.  4. Physical deconditioning Stable.  Continue using walker  5. Primary osteoarthritis involving multiple joints Scheduled Tylenol arthritis 3 times daily.  Voltaren renewed - diclofenac Sodium (VOLTAREN) 1 % GEL; Apply 2 g topically 4 (four) times daily. (for arthritis). if not covered, please call  Dispense: 400 g; Refill: 0  6. Other idiopathic scoliosis, thoracic region - diclofenac Sodium (VOLTAREN) 1 % GEL; Apply 2 g topically 4 (four) times daily. (for arthritis). if not covered, please call  Dispense: 400 g; Refill: 0  7. Bilateral lower extremity edema Encourage elevation of  lower extremities.  I have given her a written prescription for wedge pillow.  [facility] will [facility] her request.   No orders of the defined types were placed in this encounter.  No orders of the defined types were placed in this encounter.    Janice Norlander, DO [place] [phone]

## 2020-02-17 LAB — BASIC METABOLIC PANEL
BUN/Creatinine Ratio: 21 (ref 12–28)
BUN: 23 mg/dL (ref 10–36)
CO2: 27 mmol/L (ref 20–29)
Calcium: 10 mg/dL (ref 8.7–10.3)
Chloride: 97 mmol/L (ref 96–106)
Creatinine, Ser: 1.1 mg/dL — ABNORMAL HIGH (ref 0.57–1.00)
GFR calc Af Amer: 49 mL/min/{1.73_m2} — ABNORMAL LOW (ref >59)
GFR calc non Af Amer: 42 mL/min/{1.73_m2} — ABNORMAL LOW (ref >59)
Glucose: 122 mg/dL — ABNORMAL HIGH (ref 65–99)
Potassium: 4.2 mmol/L (ref 3.5–5.2)
Sodium: 138 mmol/L (ref 134–144)

## 2020-02-17 LAB — THYROID PANEL WITH TSH
Free Thyroxine Index: 2.7 (ref 1.2–4.9)
T3 Uptake Ratio: 28 % (ref 24–39)
T4, Total: 9.8 ug/dL (ref 4.5–12.0)
TSH: 2.19 u[IU]/mL (ref 0.450–4.500)

## 2020-02-18 ENCOUNTER — Ambulatory Visit: Payer: Medicare Other | Admitting: Family Medicine

## 2020-03-05 ENCOUNTER — Ambulatory Visit (INDEPENDENT_AMBULATORY_CARE_PROVIDER_SITE_OTHER): Payer: Medicare Other | Admitting: Licensed Clinical Social Worker

## 2020-03-05 DIAGNOSIS — I129 Hypertensive chronic kidney disease with stage 1 through stage 4 chronic kidney disease, or unspecified chronic kidney disease: Secondary | ICD-10-CM

## 2020-03-05 DIAGNOSIS — M810 Age-related osteoporosis without current pathological fracture: Secondary | ICD-10-CM

## 2020-03-05 DIAGNOSIS — E782 Mixed hyperlipidemia: Secondary | ICD-10-CM | POA: Diagnosis not present

## 2020-03-05 DIAGNOSIS — E039 Hypothyroidism, unspecified: Secondary | ICD-10-CM | POA: Diagnosis not present

## 2020-03-05 DIAGNOSIS — N183 Chronic kidney disease, stage 3 unspecified: Secondary | ICD-10-CM

## 2020-03-05 NOTE — Patient Instructions (Addendum)
Licensed Clinical Social Worker Visit Information  Goals we discussed today:    .  Client will talk with LCSW in next 30 days about social work needs of client (pt-stated)        CARE PLAN ENTRY   Current Barriers:   Vision challenges in client with Chronic Diagnoses of HLD, Hypothoyroidism, Osteoporosis, IBS HTN, Macular Degeneration  Hearing challenges  Clinical Social Work Clinical Goal(s):   LCSW will call client in 30 days to discuss social work needs of client  Interventions:  Talked with client about CCM program support  Talked with client about skin care issues of client (she said she has a home health nurse coming in weekly for skin care)  Talked with client about vision challenges of client  Talked with client about pain issues of client  Talked with client about fall history of client  Talked with client about CAP support in home  Talked with client about transport needs of client  Talked with client about food procurement for client  Talked with client about ADLs completion of client  Talked with client about ambulation needs (uses a walker to walk)  Talked with client about relaxation techniques (enjoys cleaning at home, enjoys TV)   Encouraged client to call RNCM as needed for nursing support   Patient Self Care Activities:   Attends scheduled medical appointments  Patient Self Care Deficits:   Hearing issues Vision hallenges    Initial goal documentation    Follow Up Plan:LCSW to call client in next 4 weeks to talk with her about the social work needs of client at that time  Materials Provided: No  The patient verbalized understanding of instructions provided today and declined a print copy of patient instruction materials.   [name].Jeniah Kishi MSW, LCSW Licensed Clinical Social Worker [facility] [phone]

## 2020-03-05 NOTE — Chronic Care Management (AMB) (Signed)
Chronic Care Management    Clinical Social Work Follow Up Note  03/05/2020 Name: Janice Reed MRN: [id number] DOB: 02-21-1922  Janice Reed is a 86 y.o. year old female who is a primary care patient of [name], DO. The CCM team was consulted for assistance with [name] .   Review of patient status, including review of consultants reports, other relevant assessments, and collaboration with appropriate care team members and the patient's provider was performed as part of comprehensive patient evaluation and provision of chronic care management services.    SDOH (Social Determinants of Health) assessments performed: No;risk for tobacco use; risk for depression; risk for social isolation     Office Visit from 02/16/2020 in [place]  PHQ-9 Total Score 3     GAD 7 : Generalized Anxiety Score 12/25/2019  Nervous, Anxious, on Edge 1  Control/stop worrying 1  Worry too much - different things 0  Trouble relaxing 0  Restless 0  Easily annoyed or irritable 0  Afraid - awful might happen 0  Total GAD 7 Score 2  Anxiety Difficulty Somewhat difficult    Outpatient Encounter Medications as of 03/05/2020  Medication Sig Note  . aspirin 325 MG tablet Take 325 mg by mouth See admin instructions. Take one tablet a day and more if needed for pain - up to 3 times daily   . bevacizumab (AVASTIN) 1.25 mg/0.1 mL SOLN 1.25 mg by Intravitreal route.  06/17/2019: Dr Baird Cancer ( eye specialist )   . Calcium-Magnesium-Vitamin D (CITRACAL CALCIUM+D PO) Take 2 capsules by mouth 2 (two) times daily.     . Cholecalciferol (VITAMIN D3) 1000 UNITS CAPS Take 1,000 Units by mouth 2 (two) times daily.    . diclofenac Sodium (VOLTAREN) 1 % GEL Apply 2 g topically 4 (four) times daily. (for arthritis). if not covered, please call   . levothyroxine (SYNTHROID) 100 MCG tablet Take 1 tablet (100 mcg total) by mouth daily.   . mometasone (ELOCON) 0.1 % cream Apply thin layer to  dry, itchy patches on back ONCE daily x7-10days.   . Omega-3 Fatty Acids (FISH OIL) 1000 MG CAPS Take 1,000 mg by mouth 2 (two) times daily.    . risedronate (ACTONEL) 35 MG tablet TAKE 1 TABLET BY MOUTH  EVERY 7 DAYS WITH WATER ON  EMPTY STOMACH, NOTHING BY  MOUTH OR LIE DOWN FOR NEXT  30 MINUTES.   [name] Kitchen triamterene-hydrochlorothiazide (MAXZIDE-25) 37.5-25 MG tablet TAKE ONE-HALF TABLET BY  MOUTH DAILY    No facility-administered encounter medications on file as of 03/05/2020.    Goals    .  Client will talk with LCSW in next 30 days about social work needs of client (pt-stated)      CARE PLAN ENTRY   Current Barriers:  . Vision challenges in client with Chronic Diagnoses of HLD, Hypothoyroidism, Osteoporosis, IBS HTN, Macular Degeneration . Hearing challenges  Clinical Social Work Clinical Goal(s):  [name] Kitchen LCSW will call client in 30 days to discuss social work needs of client  Interventions: . Talked with client about CCM program support . Talked with client about skin care issues of client (she said she has a home health nurse coming in weekly for skin care) . Talked with client about vision challenges of client . Talked with client about pain issues of client . Talked with client about fall history of client . Talked with client about CAP support in home . Talked with client about transport needs of  client . Talked with client about food procurement for client . Talked with client about ADLs completion of client . Talked with client about ambulation needs (uses a walker to walk) . Talked with client about relaxation techniques (enjoys cleaning at home, enjoys TV) .  Encouraged client to call RNCM as needed for nursing support   Patient Self Care Activities:   Attends scheduled medical appointments  Patient Self Care Deficits:   Hearing issues Vision hallenges .   Initial goal documentation    Follow Up Plan: LCSW to call client in next 4 weeks to talk with her about the  social work needs of client at that time  [name].Jalyiah Shelley MSW, LCSW Licensed Clinical Social Worker [facility] [phone]

## 2020-04-07 ENCOUNTER — Ambulatory Visit: Payer: Medicare Other | Admitting: Licensed Clinical Social Worker

## 2020-04-07 DIAGNOSIS — E039 Hypothyroidism, unspecified: Secondary | ICD-10-CM

## 2020-04-07 DIAGNOSIS — E782 Mixed hyperlipidemia: Secondary | ICD-10-CM

## 2020-04-07 DIAGNOSIS — M810 Age-related osteoporosis without current pathological fracture: Secondary | ICD-10-CM

## 2020-04-07 DIAGNOSIS — I129 Hypertensive chronic kidney disease with stage 1 through stage 4 chronic kidney disease, or unspecified chronic kidney disease: Secondary | ICD-10-CM

## 2020-04-07 NOTE — Chronic Care Management (AMB) (Signed)
Chronic Care Management    Clinical Social Work Follow Up Note  04/07/2020 Name: Janice Reed MRN: [id number] DOB: [birth year]  Janice Reed is a 86 y.o. year old female who is a primary care patient of Janice Norlander, DO. The CCM team was consulted for assistance with [name] .   Review of patient status, including review of consultants reports, other relevant assessments, and collaboration with appropriate care team members and the patient's provider was performed as part of comprehensive patient evaluation and provision of chronic care management services.    SDOH (Social Determinants of Health) assessments performed: No;risk for tobacco use; risk for depression; risk for stress; risk for physical inactivity     Office Visit from 02/16/2020 in [place]  PHQ-9 Total Score 3     GAD 7 : Generalized Anxiety Score 12/25/2019  Nervous, Anxious, on Edge 1  Control/stop worrying 1  Worry too much - different things 0  Trouble relaxing 0  Restless 0  Easily annoyed or irritable 0  Afraid - awful might happen 0  Total GAD 7 Score 2  Anxiety Difficulty Somewhat difficult    Outpatient Encounter Medications as of 04/07/2020  Medication Sig Note  . aspirin 325 MG tablet Take 325 mg by mouth See admin instructions. Take one tablet a day and more if needed for pain - up to 3 times daily   . bevacizumab (AVASTIN) 1.25 mg/0.1 mL SOLN 1.25 mg by Intravitreal route.  06/17/2019: Dr Baird Cancer ( eye specialist )   . Calcium-Magnesium-Vitamin D (CITRACAL CALCIUM+D PO) Take 2 capsules by mouth 2 (two) times daily.     . Cholecalciferol (VITAMIN D3) 1000 UNITS CAPS Take 1,000 Units by mouth 2 (two) times daily.    . diclofenac Sodium (VOLTAREN) 1 % GEL Apply 2 g topically 4 (four) times daily. (for arthritis). if not covered, please call   . levothyroxine (SYNTHROID) 100 MCG tablet Take 1 tablet (100 mcg total) by mouth daily.   . mometasone (ELOCON) 0.1 % cream  Apply thin layer to dry, itchy patches on back ONCE daily x7-10days.   . Omega-3 Fatty Acids (FISH OIL) 1000 MG CAPS Take 1,000 mg by mouth 2 (two) times daily.    . risedronate (ACTONEL) 35 MG tablet TAKE 1 TABLET BY MOUTH  EVERY 7 DAYS WITH WATER ON  EMPTY STOMACH, NOTHING BY  MOUTH OR LIE DOWN FOR NEXT  30 MINUTES.   [name] Kitchen triamterene-hydrochlorothiazide (MAXZIDE-25) 37.5-25 MG tablet TAKE ONE-HALF TABLET BY  MOUTH DAILY    No facility-administered encounter medications on file as of 04/07/2020.    Goals    .  Client will talk with LCSW in next 30 days about social work needs of client (pt-stated)      CARE PLAN ENTRY   Current Barriers:  . Vision challenges in client with Chronic Diagnoses of HLD, Hypothoyroidism, Osteoporosis, IBS HTN, Macular Degeneration . Hearing challenges  Clinical Social Work Clinical Goal(s):  [name] Kitchen LCSW will call client in 30 days to discuss social work needs of client  Interventions: . Talked with client about CCM program support . Talked with client about vision challenges of client . Talked with client about pain issues of client . Talked with client about food procurement for client . Talked with client about upcoming client appointments  Patient Self Care Activities:   Attends scheduled medical appointments  Patient Self Care Deficits:   Hearing issues Vision hallenges .   Initial goal documentation  Follow Up Plan:LCSW to call client in next 4 weeks to talk with her about the social work needs of client at that time  [name].Jese Comella MSW, LCSW Licensed Clinical Social Worker [facility] [phone]

## 2020-04-07 NOTE — Patient Instructions (Addendum)
Licensed Clinical Social Worker Visit Information  Goals we discussed today:   .  Client will talk with LCSW in next 30 days about social work needs of client (pt-stated)       CARE PLAN ENTRY   Current Barriers:   Vision challenges in client with Chronic Diagnoses of HLD, Hypothoyroidism, Osteoporosis, IBS HTN, Macular Degeneration  Hearing challenges  Clinical Social Work Clinical Goal(s):   LCSW will call client in 30 days to discuss social work needs of client  Interventions:  Talked with client about CCM program support  Talked with client about vision challenges of client  Talked with client about pain issues of client  Talked with client about food procurement for client  Talked with client about upcoming client appointments  Patient Self Care Activities:   Attends scheduled medical appointments  Patient Self Care Deficits:   Hearing issues Vision hallenges    Initial goal documentation    Follow Up Plan:LCSW to call client in next 4 weeks to talk with her about the social work needs of client at that time  Materials Provided: No  The patient verbalized understanding of instructions provided today and declined a print copy of patient instruction materials.   [name].Shaunta Oncale MSW, LCSW Licensed Clinical Social Worker [facility] [phone]

## 2020-04-21 ENCOUNTER — Other Ambulatory Visit: Payer: Self-pay | Admitting: Family Medicine

## 2020-05-03 ENCOUNTER — Ambulatory Visit: Payer: Medicare Other | Admitting: Family Medicine

## 2020-05-12 ENCOUNTER — Telehealth: Payer: Medicare Other

## 2020-05-14 ENCOUNTER — Ambulatory Visit (INDEPENDENT_AMBULATORY_CARE_PROVIDER_SITE_OTHER): Payer: Medicare Other | Admitting: Family Medicine

## 2020-05-14 ENCOUNTER — Other Ambulatory Visit: Payer: Self-pay

## 2020-05-14 ENCOUNTER — Encounter: Payer: Self-pay | Admitting: Family Medicine

## 2020-05-14 VITALS — BP 138/72 | HR 89 | Temp 97.7°F | Ht 60.0 in | Wt 104.0 lb

## 2020-05-14 DIAGNOSIS — I129 Hypertensive chronic kidney disease with stage 1 through stage 4 chronic kidney disease, or unspecified chronic kidney disease: Secondary | ICD-10-CM | POA: Diagnosis not present

## 2020-05-14 DIAGNOSIS — N183 Chronic kidney disease, stage 3 unspecified: Secondary | ICD-10-CM

## 2020-05-14 DIAGNOSIS — Z23 Encounter for immunization: Secondary | ICD-10-CM | POA: Diagnosis not present

## 2020-05-14 DIAGNOSIS — I872 Venous insufficiency (chronic) (peripheral): Secondary | ICD-10-CM | POA: Diagnosis not present

## 2020-05-14 DIAGNOSIS — R5381 Other malaise: Secondary | ICD-10-CM

## 2020-05-14 DIAGNOSIS — E039 Hypothyroidism, unspecified: Secondary | ICD-10-CM

## 2020-05-14 DIAGNOSIS — Z9181 History of falling: Secondary | ICD-10-CM

## 2020-05-14 MED ORDER — NONFORMULARY OR COMPOUNDED ITEM: 1 refills | Status: AC

## 2020-05-14 NOTE — Progress Notes (Signed)
Subjective: CC: hypothyroidism, arthritis, pressure sore PCP: [name], DO [id number] Janice Reed is a 86 y.o. female presenting to clinic today for:  1.  Hypothyroidism Patient is compliant with her Synthroid 100 mcg daily.  No change in weight, bowel habit, voice or tremor.  She does report some lower extremity edema that is slightly better with elevation of the legs.  However, she did go ahead and increase her Maxide to the full dose about 3 to 4 weeks ago this seems to be helping as well.  2.  Pressure sore Patient reports that the right ankle pressure sore has totally healed now.  She continues to use a bandage on the ankle.  She continues to be careful about how she lays at nighttime.  She does report quite a bit of dry skin on the legs and wants to know if there is anything that she can do about this.  3.  Osteoarthritis Patient with ongoing osteoarthritis, particularly in her low back which has been chronic.  She does use Tylenol but still has not picked up the Tylenol arthritis.  She plans to.  She does report some difficulty with balance and reports a fall a couple of months ago where she bruised herself.  She has considered physical therapy again as this is been helpful in the past but is not quite sure if she wants to do this yet.  She has several books and handouts at home that she was to try first.   ROS: Per HPI  Allergies  Allergen Reactions  . Ciprofloxacin Anaphylaxis  . Benicar [Olmesartan Medoxomil] Other (See Comments)    Pt doesn't remember reaction  . Clarithromycin Other (See Comments)    Pt doesn't remember reaction  . Sulfa Antibiotics Other (See Comments)    Dr told patient not to take any more  . Alendronate Sodium Other (See Comments)    Upset stomach  . Celebrex [Celecoxib] Other (See Comments)    Dr said that it was too strong for her  . Penicillins Rash   Past Medical History:  Diagnosis Date  . Arthritis   . Arthritis pain   . Benign  hypertensive heart disease   . Breast cancer ([place])   . Cancer of right breast ([place]) 09/28/2011   IDC;  T2, N0 ([facility]+);   ER+;  Her-2 neg.,  Right PM,SLN  09/08/08   . Collagenous colitis   . Diverticulosis of colon   . External hemorrhoid   . Hearing loss   . Hypertension   . IBS (irritable bowel syndrome)   . Macular degeneration   . Macular degeneration of both eyes   . Neuropathy, peripheral   . Nonspecific abnormal electrocardiogram (ECG) (EKG)   . Osteoporosis   . Other and unspecified hyperlipidemia   . Palpitations   . Phlebitis and thrombophlebitis of unspecified site   . Prolapse of vaginal walls without mention of uterine prolapse   . Shingles   . Symptomatic menopausal or female climacteric states   . Unspecified hypothyroidism     Current Outpatient Medications:  .  aspirin 325 MG tablet, Take 325 mg by mouth See admin instructions. Take one tablet a day and more if needed for pain - up to 3 times daily, Disp: , Rfl:  .  bevacizumab (AVASTIN) 1.25 mg/0.1 mL SOLN, 1.25 mg by Intravitreal route. , Disp: , Rfl:  .  Calcium-Magnesium-Vitamin D (CITRACAL CALCIUM+D PO), Take 2 capsules by mouth 2 (two) times daily.  , Disp: ,  Rfl:  .  Cholecalciferol (VITAMIN D3) 1000 UNITS CAPS, Take 1,000 Units by mouth 2 (two) times daily. , Disp: , Rfl:  .  diclofenac Sodium (VOLTAREN) 1 % GEL, Apply 2 g topically 4 (four) times daily. (for arthritis). if not covered, please call, Disp: 400 g, Rfl: 0 .  levothyroxine (SYNTHROID) 100 MCG tablet, TAKE 1 TABLET BY MOUTH  DAILY, Disp: 90 tablet, Rfl: 3 .  mometasone (ELOCON) 0.1 % cream, Apply thin layer to dry, itchy patches on back ONCE daily x7-10days., Disp: 45 g, Rfl: 0 .  Omega-3 Fatty Acids (FISH OIL) 1000 MG CAPS, Take 1,000 mg by mouth 2 (two) times daily. , Disp: , Rfl:  .  risedronate (ACTONEL) 35 MG tablet, TAKE 1 TABLET BY MOUTH  EVERY 7 DAYS WITH WATER ON  EMPTY STOMACH, NOTHING BY  MOUTH OR LIE DOWN FOR NEXT  30 MINUTES., Disp: 12  tablet, Rfl: 3 .  triamterene-hydrochlorothiazide (MAXZIDE-25) 37.5-25 MG tablet, TAKE ONE-HALF TABLET BY  MOUTH DAILY, Disp: 45 tablet, Rfl: 3 Social History   Socioeconomic History  . Marital status: Widowed    Spouse name: Not on file  . Number of children: 2  . Years of education: 65  . Highest education level: 12th grade  Occupational History  . Occupation: retired-homemaker  Tobacco Use  . Smoking status: Never Smoker  . Smokeless tobacco: Never Used  Vaping Use  . Vaping Use: Never used  Substance and Sexual Activity  . Alcohol use: No  . Drug use: No  . Sexual activity: Not Currently  Other Topics Concern  . 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 No alcohol or caffeine or tobacco.   Social Determinants of Health   Financial Resource Strain:   . Difficulty of Paying Living Expenses: Not on file  Food Insecurity:   . Worried About Charity fundraiser in the Last Year: Not on file  . Ran Out of Food in the Last Year: Not on file  Transportation Needs:   . Lack of Transportation (Medical): Not on file  . Lack of Transportation (Non-Medical): Not on file  Physical Activity:   . Days of Exercise per Week: Not on file  . Minutes of Exercise per Session: Not on file  Stress:   . Feeling of Stress : Not on file  Social Connections:   . Frequency of Communication with Friends and Family: Not on file  . Frequency of Social Gatherings with Friends and Family: Not on file  . Attends Religious Services: Not on file  . Active Member of Clubs or Organizations: Not on file  . Attends Archivist Meetings: Not on file  . Marital Status: Not on file  Intimate Partner Violence:   . Fear of Current or Ex-Partner: Not on file  . Emotionally Abused: Not on file  . Physically Abused: Not on file  . Sexually Abused: Not on file   Family History  Problem Relation Age of Onset  . Heart disease Mother        Heart  failure per medical history form dated 09/27/09.  [name] Kitchen Heart disease Father        Heart attack per medical history form dated 09/27/09.  [name] Kitchen Heart attack Father   . Heart disease Brother        Heart failure per medical history form dated 09/27/09.  . Stroke Brother   . Stroke Brother   . Leukemia  Brother   . Breast cancer Cousin   . Healthy Daughter   . Healthy Son   . Colon cancer Neg Hx     Objective: Office vital signs reviewed. BP 138/72   Pulse 89   Temp 97.7 F (36.5 C) (Temporal)   Ht 5' (1.524 m)   Wt 104 lb (47.2 kg)   SpO2 98%   BMI 20.31 kg/m   Physical Examination:  General: Awake, alert, well appearing elderly female, No acute distress HEENT: Normal; no exophthalmos.  Cardio: regular rate and rhythm, S1S2 heard, no murmurs appreciated Pulm: clear to auscultation bilaterally, no wheezes, rhonchi or rales; normal work of breathing on room air Extremities: warm, well perfused, 1-2+ pitting edema to lower shins. No cyanosis or clubbing; +2 pulses bilaterally MSK: slow gait and hunched station; ambulating with use of walker Skin: Right ankle with healed pressure injury.  She has chronic venous stasis changes noted bilaterally. Neuro: Alert and oriented.  Assessment/ Plan: 86 y.o. female   1. Venous stasis dermatitis of both lower extremities I am going to place her on some triamcinolone compounded with CeraVe.  Perhaps this will help with some of the thickening and scaling of her skin.  I would have her use it only once daily given history of ulcerations.  I'd hate to thin the skin too much. - NONFORMULARY OR COMPOUNDED ITEM; Mix triamcinolone cream 0.1% in ceravae cream (1:3) # 464 grams (1 large jar): Apply to affected areas of legs daily.  Dispense: 464 each; Refill: 1  2. Need for immunization against influenza Administered - Flu Vaccine QUAD High Dose(Fluad)  3. Benign hypertension with chronic kidney disease, stage III (HCC) Blood pressures controlled.  Check  renal function given increased use of the diuretic - Renal Function Panel  4. Acquired hypothyroidism Asymptomatic - Thyroid Panel With TSH  5. At high risk for falls Discussed highly that she should consider going and seeing the physical therapist at least getting home health physical therapy to improve balance and strength.  I worry about her falling.  6. Physical deconditioning   No orders of the defined types were placed in this encounter.  No orders of the defined types were placed in this encounter.    [name], DO [place] [phone]

## 2020-05-14 NOTE — Patient Instructions (Signed)
Let me know if you want to start seeing [place] again for falls.

## 2020-05-15 LAB — RENAL FUNCTION PANEL
Albumin: 4.4 g/dL (ref 3.5–4.6)
BUN/Creatinine Ratio: 23 (ref 12–28)
BUN: 23 mg/dL (ref 10–36)
CO2: 28 mmol/L (ref 20–29)
Calcium: 10.6 mg/dL — ABNORMAL HIGH (ref 8.7–10.3)
Chloride: 96 mmol/L (ref 96–106)
Creatinine, Ser: 0.99 mg/dL (ref 0.57–1.00)
GFR calc Af Amer: 56 mL/min/{1.73_m2} — ABNORMAL LOW (ref >59)
GFR calc non Af Amer: 48 mL/min/{1.73_m2} — ABNORMAL LOW (ref >59)
Glucose: 150 mg/dL — ABNORMAL HIGH (ref 65–99)
Phosphorus: 3.4 mg/dL (ref 3.0–4.3)
Potassium: 4.3 mmol/L (ref 3.5–5.2)
Sodium: 139 mmol/L (ref 134–144)

## 2020-05-15 LAB — THYROID PANEL WITH TSH
Free Thyroxine Index: 3.3 (ref 1.2–4.9)
T3 Uptake Ratio: 30 % (ref 24–39)
T4, Total: 11 ug/dL (ref 4.5–12.0)
TSH: 3.93 u[IU]/mL (ref 0.450–4.500)

## 2020-05-26 ENCOUNTER — Other Ambulatory Visit: Payer: Self-pay | Admitting: Family Medicine

## 2020-06-03 ENCOUNTER — Telehealth (INDEPENDENT_AMBULATORY_CARE_PROVIDER_SITE_OTHER): Payer: Medicare Other | Admitting: Family Medicine

## 2020-06-03 ENCOUNTER — Encounter: Payer: Medicare Other | Admitting: Family Medicine

## 2020-06-03 ENCOUNTER — Encounter: Payer: Self-pay | Admitting: Family Medicine

## 2020-06-03 ENCOUNTER — Other Ambulatory Visit: Payer: Self-pay

## 2020-06-03 DIAGNOSIS — Z9181 History of falling: Secondary | ICD-10-CM

## 2020-06-03 DIAGNOSIS — W19XXXA Unspecified fall, initial encounter: Secondary | ICD-10-CM

## 2020-06-03 DIAGNOSIS — R296 Repeated falls: Secondary | ICD-10-CM | POA: Diagnosis not present

## 2020-06-03 DIAGNOSIS — M533 Sacrococcygeal disorders, not elsewhere classified: Secondary | ICD-10-CM

## 2020-06-03 NOTE — Progress Notes (Signed)
Erroneous encounter

## 2020-06-03 NOTE — Progress Notes (Signed)
Virtual Visit via telephone Note Due to COVID-19 pandemic this visit was conducted virtually. This visit type was conducted due to national recommendations for restrictions regarding the COVID-19 Pandemic (e.g. social distancing, sheltering in place) in an effort to limit this patient's exposure and mitigate transmission in our community. All issues noted in this document were discussed and addressed.  A physical exam was not performed with this format.  I connected with Janice Reed on 06/03/20 at 1328 by telephone and verified that I am speaking with the correct person using two identifiers. Janice Reed is currently located at home and her aides is currently with her during the visit. The provider, Gwenlyn Perking, FNP is located in their office at time of visit.  I discussed the limitations, risks, security and privacy concerns of performing an evaluation and management service by telephone and the availability of in person appointments. I also discussed with the patient that there may be a patient responsible charge related to this service. The patient expressed understanding and agreed to proceed.  Attempted to contact with video but text link was not received.   History and Present Illness:  HPI Janice Reed reports a fall that occurred on 05/24/20. She was walking in her room, using her walker, and fell to the floor. She is not sure what causes her to fall. She did not hit her head. She landed on her bottom. She has been having pain around her tailbone since this happened. She reports a constant ache with sharp pains with movements. Pain is moderate. She has been taking tylenol with some relief. Sitting down with a cushion does relieve the pain some. She denies numbness, tingling, changes in bowel or bladder control, or dizziness. She is able to bear weight.   ROS Per HPI.   Observations/Objective: Patient is alert and oriented x 3. She is able to speak in full sentences without difficulty.     Assessment and Plan: Janice Reed was seen today for fall.  Diagnoses and all orders for this visit:  Fall, initial encounter Coccyx pain Discussed that patient could come to office to for Xray, however there is limited intervention if her coccyx is broken. She prefers not to have Xray done if it is not necessary. Continue tylenol and heat. Try voltaren gel to area. Use doughnut or cushion when sitting. Return to office for new or worsening symptoms, or if symptoms persist.   At high risk for falls Recurrent falls Review of note from in person visit with PCP on 05/14/20 shows that home health PT was recommended to improve balance and strength to prevent additional falls. Discussed this with patient and she is agreeable.  -     Ambulatory referral to Home Health  Follow Up Instructions: Return to office for new or worsening symptoms, or if symptoms persist.   I discussed the assessment and treatment plan with the patient. The patient was provided an opportunity to ask questions and all were answered. The patient agreed with the plan and demonstrated an understanding of the instructions.   The patient was advised to call back or seek an in-person evaluation if the symptoms worsen or if the condition fails to improve as anticipated.  The above assessment and management plan was discussed with the patient. The patient verbalized understanding of and has agreed to the management plan. Patient is aware to call the clinic if symptoms persist or worsen. Patient is aware when to return to the clinic for a follow-up visit. Patient  educated on when it is appropriate to go to the emergency department.   Time call ended:  1342  I provided 15 minutes of non-face-to-face time during this encounter.    Gwenlyn Perking, FNP

## 2020-06-10 ENCOUNTER — Telehealth: Payer: Self-pay

## 2020-06-10 NOTE — Telephone Encounter (Signed)
Vicky Corum with [facility] would like to speak with you regarding patient.

## 2020-06-10 NOTE — Telephone Encounter (Signed)
Calling to follow up on home health referral.   Any idea when someone will be going out to see her?

## 2020-06-15 ENCOUNTER — Telehealth: Payer: Self-pay | Admitting: Family Medicine

## 2020-06-15 NOTE — Telephone Encounter (Signed)
Admitted pt for PT and will continue for 6 weeks, would like to add nursing because pt has a wound. If possible, would like to receive a call back to know if this has been approved or not

## 2020-06-15 NOTE — Telephone Encounter (Signed)
LMOVM VO to add nursing is ok

## 2020-06-15 NOTE — Telephone Encounter (Signed)
Is this something I can add on?

## 2020-06-17 ENCOUNTER — Telehealth: Payer: Self-pay | Admitting: *Deleted

## 2020-06-17 NOTE — Telephone Encounter (Signed)
TC from [facility] w/ [facility] Reporting elevated BP today, pt had an anxious, busy morning Her BP was 142/100 after relaxing and drinking a glass of water, she has no other symptoms

## 2020-06-17 NOTE — Telephone Encounter (Signed)
Please recheck tomorrow and if still hypertensive, may need to consider evaluation in office.

## 2020-06-17 NOTE — Telephone Encounter (Signed)
Aware and verbalizes understanding.  

## 2020-06-18 ENCOUNTER — Ambulatory Visit: Payer: Medicare Other | Admitting: Licensed Clinical Social Worker

## 2020-06-18 DIAGNOSIS — E782 Mixed hyperlipidemia: Secondary | ICD-10-CM

## 2020-06-18 DIAGNOSIS — M810 Age-related osteoporosis without current pathological fracture: Secondary | ICD-10-CM

## 2020-06-18 DIAGNOSIS — N183 Chronic kidney disease, stage 3 unspecified: Secondary | ICD-10-CM

## 2020-06-18 DIAGNOSIS — E039 Hypothyroidism, unspecified: Secondary | ICD-10-CM

## 2020-06-18 NOTE — Patient Instructions (Addendum)
Licensed Clinical Social Worker Visit Information  Goals we discussed today:   .  Client will talk with LCSW in next 30 days about social work needs of client (pt-stated)        CARE PLAN ENTRY   Current Barriers:   Vision challenges in client with Chronic Diagnoses of HLD, Hypothoyroidism, Osteoporosis, IBS HTN, Macular Degeneration  Hearing challenges  Clinical Social Work Clinical Goal(s):   LCSW will call client in 30 days to discuss social work needs of client  Interventions: Talked with Janice Reed, daughter, about client needs Talked with Janice Reed about client plans to receive HHPT in the home Talked with Janice Reed about pain issues of client Talked with Janice Reed about client fall on May 24, 2020. Talked with Janice Reed about client ambulation (client uses a walker to ambulate) Talked with Janice Reed about client medical appointments Talked with Janice Reed about skin care issues of client Janice Reed said that home health agency plans to send a nurse next week to home of client to evaluate skin needs of client) Talked with Janice Reed about appetite of client Talked with Janice Reed about relaxation techniques of client (watch TV, talking with family members via phone) Talked with Janice Reed about client eye care with medical provider Talked with Janice Reed about transport needs of client Talked with Janice Reed about in home client support through [facility] program  Patient Self Care Activities:   Attends scheduled medical appointments  Patient Self Care Deficits:   Hearing issues Vision hallenges    Initial goal documentation     Follow Up Plan: LCSW to call client/Janice Reed, daughter, in next 4 weeks to talk with client/daughter about the social work needs of client at that time  Materials Provided: No  The patient/Janice Reed, daughter of patient, verbalized understanding of instructions provided today and declined a print copy of patient instruction materials.   [name].[name] MSW, LCSW Licensed  Clinical Social Worker [name] [phone]

## 2020-06-18 NOTE — Chronic Care Management (AMB) (Signed)
Chronic Care Management    Clinical Social Work Follow Up Note  06/18/2020 Name: Janice Reed MRN: [id number] DOB: January 06, 1922  Janice Reed is a 86 y.o. year old female who is a primary care patient of [name], DO. The CCM team was consulted for assistance with [name] .   Review of patient status, including review of consultants reports, other relevant assessments, and collaboration with appropriate care team members and the patient's provider was performed as part of comprehensive patient evaluation and provision of chronic care management services.    SDOH (Social Determinants of Health) assessments performed: No;risk for tobacco use; risk for depression; risk for stress; risk for physical inactivity    Office Visit from 05/14/2020 in [place]  PHQ-9 Total Score 5     GAD 7 : Generalized Anxiety Score 05/14/2020 12/25/2019  Nervous, Anxious, on Edge 1 1  Control/stop worrying 1 1  Worry too much - different things 0 0  Trouble relaxing 1 0  Restless 0 0  Easily annoyed or irritable 0 0  Afraid - awful might happen 0 0  Total GAD 7 Score 3 2  Anxiety Difficulty Not difficult at all Somewhat difficult    Outpatient Encounter Medications as of 06/18/2020  Medication Sig Note  . aspirin 325 MG tablet Take 325 mg by mouth See admin instructions. Take one tablet a day and more if needed for pain - up to 3 times daily   . bevacizumab (AVASTIN) 1.25 mg/0.1 mL SOLN 1.25 mg by Intravitreal route.  06/17/2019: Dr Baird Cancer ( eye specialist )   . Calcium-Magnesium-Vitamin D (CITRACAL CALCIUM+D PO) Take 2 capsules by mouth 2 (two) times daily.     . Cholecalciferol (VITAMIN D3) 1000 UNITS CAPS Take 1,000 Units by mouth 2 (two) times daily.    . diclofenac Sodium (VOLTAREN) 1 % GEL Apply 2 g topically 4 (four) times daily. (for arthritis). if not covered, please call   . levothyroxine (SYNTHROID) 100 MCG tablet TAKE 1 TABLET BY MOUTH  DAILY   .  mometasone (ELOCON) 0.1 % cream Apply thin layer to dry, itchy patches on back ONCE daily x7-10days.   . NONFORMULARY OR COMPOUNDED ITEM Mix triamcinolone cream 0.1% in ceravae cream (1:3) # 464 grams (1 large jar): Apply to affected areas of legs daily.   . Omega-3 Fatty Acids (FISH OIL) 1000 MG CAPS Take 1,000 mg by mouth 2 (two) times daily.    . risedronate (ACTONEL) 35 MG tablet TAKE 1 TABLET BY MOUTH  WEEKLY WITH 8 OZ OF PLAIN  WATER 30 MINUTES BEFORE  FIRST FOOD, DRINK OR MEDS.  STAY UPRIGHT FOR 30 MINS   . triamterene-hydrochlorothiazide (MAXZIDE-25) 37.5-25 MG tablet TAKE ONE-HALF TABLET BY  MOUTH DAILY    No facility-administered encounter medications on file as of 06/18/2020.    Goals    .  Client will talk with LCSW in next 30 days about social work needs of client (pt-stated)      CARE PLAN ENTRY   Current Barriers:  . Vision challenges in client with Chronic Diagnoses of HLD, Hypothoyroidism, Osteoporosis, IBS HTN, Macular Degeneration . Hearing challenges  Clinical Social Work Clinical Goal(s):  [name] Kitchen LCSW will call client in 30 days to discuss social work needs of client  Interventions: Talked with [name], daughter, about client needs Talked with [name] about client plans to receive HHPT in the home Talked with [name] about pain issues of client Talked with [name] about client  fall on May 24, 2020. Talked with [name] about client ambulation (client uses a walker to ambulate) Talked with [name] about client medical appointments Talked with [name] about skin care issues of client [name] said that home health agency plans to send a nurse next week to home of client to evaluate skin needs of client) Talked with [name] about appetite of client Talked with [name] about relaxation techniques of client (watch TV, talking with family members via phone) Talked with [name] about client eye care with medical provider Talked with [name] about transport needs of client Talked with [name] about in  home client support through [facility] program  Patient Self Care Activities:   Attends scheduled medical appointments  Patient Self Care Deficits:   Hearing issues Vision hallenges .   Initial goal documentation     Follow Up Plan: LCSW to call client/[name] Done, daughter, in next 4 weeks to talk with client/daughter about the social work needs of client at that time  [name].Docie Abramovich MSW, LCSW Licensed Clinical Social Worker [facility] [phone]

## 2020-06-29 ENCOUNTER — Other Ambulatory Visit: Payer: Self-pay

## 2020-06-29 ENCOUNTER — Ambulatory Visit (INDEPENDENT_AMBULATORY_CARE_PROVIDER_SITE_OTHER): Payer: Medicare Other

## 2020-06-29 DIAGNOSIS — Z9181 History of falling: Secondary | ICD-10-CM

## 2020-06-29 DIAGNOSIS — N183 Chronic kidney disease, stage 3 unspecified: Secondary | ICD-10-CM

## 2020-06-29 DIAGNOSIS — I8312 Varicose veins of left lower extremity with inflammation: Secondary | ICD-10-CM | POA: Diagnosis not present

## 2020-06-29 DIAGNOSIS — L89510 Pressure ulcer of right ankle, unstageable: Secondary | ICD-10-CM

## 2020-06-29 DIAGNOSIS — E039 Hypothyroidism, unspecified: Secondary | ICD-10-CM

## 2020-06-29 DIAGNOSIS — I8311 Varicose veins of right lower extremity with inflammation: Secondary | ICD-10-CM

## 2020-06-29 DIAGNOSIS — M150 Primary generalized (osteo)arthritis: Secondary | ICD-10-CM

## 2020-06-29 DIAGNOSIS — R2681 Unsteadiness on feet: Secondary | ICD-10-CM

## 2020-06-29 DIAGNOSIS — I129 Hypertensive chronic kidney disease with stage 1 through stage 4 chronic kidney disease, or unspecified chronic kidney disease: Secondary | ICD-10-CM | POA: Diagnosis not present

## 2020-07-14 ENCOUNTER — Telehealth: Payer: Self-pay

## 2020-07-14 NOTE — Telephone Encounter (Signed)
She needs to be physically assessed.  I agree xrays would be beneficial as well.

## 2020-07-14 NOTE — Telephone Encounter (Signed)
[name] called and states that patient is still having low back pain since fall on 05/24/20 - patient saw Tiffany following fall on 06/03/20 via video visit - patient has been doing therapy at home but pain is not improving, instead has increased and patient is now also c/o left hip pain and they advised that it may be helpful for patient to have xrays. Please advise if patient can come in for x-rays or if patient needs a follow up appointment.

## 2020-07-15 NOTE — Telephone Encounter (Signed)
Patient aware.

## 2020-07-20 ENCOUNTER — Ambulatory Visit: Payer: Medicare Other | Admitting: Family

## 2020-07-20 ENCOUNTER — Telehealth: Payer: Self-pay

## 2020-07-20 NOTE — Telephone Encounter (Signed)
Laurey Arrow called from [facility] stating that pt was supposed to be discharged from PT today but says pt is still complaining about a lot of pelvic pain so she is scheduled to have an xray next Tuesday and they are going to extend her PT.  Pt will have 3 wks of PT 1x per week.  Can call Laurey Arrow if needed. [phone]

## 2020-07-26 ENCOUNTER — Ambulatory Visit: Payer: Medicare Other | Admitting: Licensed Clinical Social Worker

## 2020-07-26 DIAGNOSIS — E782 Mixed hyperlipidemia: Secondary | ICD-10-CM

## 2020-07-26 DIAGNOSIS — M810 Age-related osteoporosis without current pathological fracture: Secondary | ICD-10-CM

## 2020-07-26 DIAGNOSIS — E039 Hypothyroidism, unspecified: Secondary | ICD-10-CM

## 2020-07-26 DIAGNOSIS — I129 Hypertensive chronic kidney disease with stage 1 through stage 4 chronic kidney disease, or unspecified chronic kidney disease: Secondary | ICD-10-CM

## 2020-07-26 NOTE — Patient Instructions (Addendum)
Licensed Clinical Social Worker Visit Information  Goals we discussed today:  .  Client will talk with LCSW in next 30 days about social work needs of client (pt-stated)        CARE PLAN ENTRY   Current Barriers:   Vision challenges in client with Chronic Diagnoses of HLD, Hypothoyroidism, Osteoporosis, IBS HTN, Macular Degeneration  Hearing challenges  Clinical Social Work Clinical Goal(s):   LCSW will call client in 30 days to discuss social work needs of client  Interventions:  Talked with Janice Reed, daughter of patient, about client needs Talked with Janice Reed about pain issues of client Talked with Janice Reed about physical therapy sessions of client Talked with Janice Reed about appetite of client Talked with Janice Reed about sleeping issues of client Talked with Janice Reed about upcoming client medical appointments Talked with Janice Reed about ambulation issues of client (client uses a walker to ambulate) Talked with Janice Reed about occasional client confusion Talked with Janice Reed about vision challenges of client Talked with Janice Reed about home health support through [facility] Talked with Janice Reed about ADLs completion of client Talked with Janice Reed about [facility] program support for client Talked with Janice Reed about process for FL-2 completion through [facility]   Patient Self Care Activities:   Attends scheduled medical appointments  Patient Self Care Deficits:   Hearing issues Vision hallenges   Initial goal documentation     Follow Up Plan: LCSW to call client/Janice Reed, daughter,in next 4 weeks to talk with client/daughterabout the social work needs of client at that time  Materials Provided: No  The patient/Janice Reed, daughter of patient, verbalized understanding of instructions provided today and declined a print copy of patient instruction materials.   [name].[name] MSW, LCSW Licensed Clinical Social Worker [facility] [phone]

## 2020-07-26 NOTE — Chronic Care Management (AMB) (Signed)
Chronic Care Management    Clinical Social Work Follow Up Note  07/26/2020 Name: Janice Reed: [id number] DOB: January 29, 1922  Janice Reed is a 86 y.o. year old female who is a primary care patient of Janice Norlander, DO. The CCM team was consulted for assistance with Janice Reed .   Review of patient status, including review of consultants reports, other relevant assessments, and collaboration with appropriate care team members and the patient's provider was performed as part of comprehensive patient evaluation and provision of chronic care management services.    SDOH (Social Determinants of Health) assessments performed: No; risk for depression; risk for tobacco use; risk for stress; risk for physical inactivity . Janice Reed Office Visit from 05/14/2020 in [place]  PHQ-9 Total Score 5     GAD 7 : Generalized Anxiety Score 05/14/2020 12/25/2019  Nervous, Anxious, on Edge 1 1  Control/stop worrying 1 1  Worry too much - different things 0 0  Trouble relaxing 1 0  Restless 0 0  Easily annoyed or irritable 0 0  Afraid - awful might happen 0 0  Total GAD 7 Score 3 2  Anxiety Difficulty Not difficult at all Somewhat difficult    Outpatient Encounter Medications as of 07/26/2020  Medication Sig Note  . aspirin 325 MG tablet Take 325 mg by mouth See admin instructions. Take one tablet a day and more if needed for pain - up to 3 times daily   . bevacizumab (AVASTIN) 1.25 mg/0.1 mL SOLN 1.25 mg by Intravitreal route.  06/17/2019: Dr Baird Cancer ( eye specialist )   . Calcium-Magnesium-Vitamin D (CITRACAL CALCIUM+D PO) Take 2 capsules by mouth 2 (two) times daily.     . Cholecalciferol (VITAMIN D3) 1000 UNITS CAPS Take 1,000 Units by mouth 2 (two) times daily.    . diclofenac Sodium (VOLTAREN) 1 % GEL Apply 2 g topically 4 (four) times daily. (for arthritis). if not covered, please call   . levothyroxine (SYNTHROID) 100 MCG tablet TAKE 1 TABLET BY  MOUTH  DAILY   . mometasone (ELOCON) 0.1 % cream Apply thin layer to dry, itchy patches on back ONCE daily x7-10days.   . NONFORMULARY OR COMPOUNDED ITEM Mix triamcinolone cream 0.1% in ceravae cream (1:3) # 464 grams (1 large jar): Apply to affected areas of legs daily.   . Omega-3 Fatty Acids (FISH OIL) 1000 MG CAPS Take 1,000 mg by mouth 2 (two) times daily.    . risedronate (ACTONEL) 35 MG tablet TAKE 1 TABLET BY MOUTH  WEEKLY WITH 8 OZ OF PLAIN  WATER 30 MINUTES BEFORE  FIRST FOOD, DRINK OR MEDS.  STAY UPRIGHT FOR 30 MINS   . triamterene-hydrochlorothiazide (MAXZIDE-25) 37.5-25 MG tablet TAKE ONE-HALF TABLET BY  MOUTH DAILY    No facility-administered encounter medications on file as of 07/26/2020.    Goals    .  Client will talk with LCSW in next 30 days about social work needs of client (pt-stated)      CARE PLAN ENTRY   Current Barriers:  . Vision challenges in client with Chronic Diagnoses of HLD, Hypothoyroidism, Osteoporosis, IBS HTN, Macular Degeneration . Hearing challenges  Clinical Social Work Clinical Goal(s):  [name] Kitchen LCSW will call client in 30 days to discuss social work needs of client  Interventions:  Talked with Janice Reed, daughter of patient, about client needs Talked with Janice Reed about pain issues of client Talked with Janice Reed about physical therapy sessions of client Talked with Janice Reed about  appetite of client Talked with Janice Reed about sleeping issues of client Talked with Janice Reed about upcoming client medical appointments Talked with Janice Reed about ambulation issues of client (client uses a walker to ambulate) Talked with Janice Reed about occasional client confusion Talked with Janice Reed about vision challenges of client Talked with Janice Reed about home health support through [facility] Talked with Janice Reed about ADLs completion of client Talked with Janice Reed about Janice Reed program support for client Talked with Janice Reed about process for FL-2 completion through [facility]   Patient Self Care Activities:   Attends  scheduled medical appointments  Patient Self Care Deficits:   Hearing issues Vision hallenges   Initial goal documentation     Follow Up Plan: LCSW to call client/Janice Reed, daughter, in next 4 weeks to talk with client/daughter about the social work needs of client at that time  Janice Reed MSW, LCSW Licensed Clinical Social Worker [facility] [phone]

## 2020-07-27 ENCOUNTER — Ambulatory Visit (INDEPENDENT_AMBULATORY_CARE_PROVIDER_SITE_OTHER): Payer: Medicare Other | Admitting: Family Medicine

## 2020-07-27 ENCOUNTER — Ambulatory Visit (INDEPENDENT_AMBULATORY_CARE_PROVIDER_SITE_OTHER): Payer: Medicare Other

## 2020-07-27 ENCOUNTER — Other Ambulatory Visit: Payer: Self-pay

## 2020-07-27 ENCOUNTER — Encounter: Payer: Self-pay | Admitting: Family Medicine

## 2020-07-27 VITALS — BP 148/80 | HR 92 | Temp 97.3°F | Ht 60.0 in | Wt 110.1 lb

## 2020-07-27 DIAGNOSIS — M7918 Myalgia, other site: Secondary | ICD-10-CM

## 2020-07-27 DIAGNOSIS — M533 Sacrococcygeal disorders, not elsewhere classified: Secondary | ICD-10-CM

## 2020-07-27 DIAGNOSIS — W19XXXD Unspecified fall, subsequent encounter: Secondary | ICD-10-CM

## 2020-07-27 NOTE — Patient Instructions (Signed)
Hip Pain The hip is the joint between the upper legs and the lower pelvis. The bones, cartilage, tendons, and muscles of your hip joint support your body and allow you to move around. Hip pain can range from a minor ache to severe pain in one or both of your hips. The pain may be felt on the inside of the hip joint near the groin, or on the outside near the buttocks and upper thigh. You may also have swelling or stiffness in your hip area. Follow these instructions at home: Managing pain, stiffness, and swelling      If directed, put ice on the painful area. To do this: ? Put ice in a plastic bag. ? Place a towel between your skin and the bag. ? Leave the ice on for 20 minutes, 2-3 times a day.  If directed, apply heat to the affected area as often as told by your health care provider. Use the heat source that your health care provider recommends, such as a moist heat pack or a heating pad. ? Place a towel between your skin and the heat source. ? Leave the heat on for 20-30 minutes. ? Remove the heat if your skin turns bright red. This is especially important if you are unable to feel pain, heat, or cold. You may have a greater risk of getting burned. Activity  Do exercises as told by your health care provider.  Avoid activities that cause pain. General instructions   Take over-the-counter and prescription medicines only as told by your health care provider.  Keep a journal of your symptoms. Write down: ? How often you have hip pain. ? The location of your pain. ? What the pain feels like. ? What makes the pain worse.  Sleep with a pillow between your legs on your most comfortable side.  Keep all follow-up visits as told by your health care provider. This is important. Contact a health care provider if:  You cannot put weight on your leg.  Your pain or swelling continues or gets worse after one week.  It gets harder to walk.  You have a fever. Get help right away  if:  You fall.  You have a sudden increase in pain and swelling in your hip.  Your hip is red or swollen or very tender to touch. Summary  Hip pain can range from a minor ache to severe pain in one or both of your hips.  The pain may be felt on the inside of the hip joint near the groin, or on the outside near the buttocks and upper thigh.  Avoid activities that cause pain.  Write down how often you have hip pain, the location of the pain, what makes it worse, and what it feels like. This information is not intended to replace advice given to you by your health care provider. Make sure you discuss any questions you have with your health care provider. Document Revised: 12/09/2018 Document Reviewed: 12/09/2018 Elsevier Patient Education  2020 Elsevier Inc. -- 

## 2020-07-27 NOTE — Progress Notes (Signed)
Subjective: CC: sacral pain PCP: [name], DO  Janice Reed is a 86 y.o. female presenting with Janice Reed aide to clinic today for:  1. Sacral pain Janice Reed had a fall while using Janice Reed walker on 05/24/20 where she landed on Janice Reed bottom. She has had pain around Janice Reed tailbone since. The pain radiates to Janice Reed gluteal muscles at times. The pain is a 5/10. The pain does sometimes radiates down Janice Reed leg. It is an achy pain with intermittent worsening. Some days it will hurt worse on the right and some days it will hurt worse on the left. She has been doing PT and this has helped. She is also taking tylenol arthritis with a little improvement. She also has tried voltaren gel and bengay without improvement. Elwin Mocha has helped some. She has not had any more falls. She denies changes in Janice Reed bowel or bladder control. She uses a walker for ambulation. She is able to bear weight.   Relevant past medical, surgical, family, and social history reviewed and updated as indicated.  Allergies and medications reviewed and updated.  Allergies  Allergen Reactions  . Ciprofloxacin Anaphylaxis  . Benicar [Olmesartan Medoxomil] Other (See Comments)    Pt doesn't remember reaction  . Clarithromycin Other (See Comments)    Pt doesn't remember reaction  . Sulfa Antibiotics Other (See Comments)    Dr told patient not to take any more  . Alendronate Sodium Other (See Comments)    Upset stomach  . Celebrex [Celecoxib] Other (See Comments)    Dr said that it was too strong for Janice Reed  . Penicillins Rash   Past Medical History:  Diagnosis Date  . Arthritis   . Arthritis pain   . Benign hypertensive heart disease   . Breast cancer ([place])   . Cancer of right breast ([place]) 09/28/2011   IDC;  T2, N0 ([facility]+);   ER+;  Janice Reed-2 neg.,  Right PM,SLN  09/08/08   . Collagenous colitis   . Diverticulosis of colon   . External hemorrhoid   . Hearing loss   . Hypertension   . IBS (irritable bowel syndrome)   . Macular  degeneration   . Macular degeneration of both eyes   . Neuropathy, peripheral   . Nonspecific abnormal electrocardiogram (ECG) (EKG)   . Osteoporosis   . Other and unspecified hyperlipidemia   . Palpitations   . Phlebitis and thrombophlebitis of unspecified site   . Prolapse of vaginal walls without mention of uterine prolapse   . Shingles   . Symptomatic menopausal or female climacteric states   . Unspecified hypothyroidism     Current Outpatient Medications:  .  acetaminophen (TYLENOL 8 HOUR ARTHRITIS PAIN) 650 MG CR tablet, Take 650 mg by mouth every 8 (eight) hours as needed for pain., Disp: , Rfl:  .  aspirin 325 MG tablet, Take 325 mg by mouth See admin instructions. Take one tablet a day and more if needed for pain - up to 3 times daily, Disp: , Rfl:  .  bevacizumab (AVASTIN) 1.25 mg/0.1 mL SOLN, 1.25 mg by Intravitreal route. , Disp: , Rfl:  .  Calcium-Magnesium-Vitamin D (CITRACAL CALCIUM+D PO), Take 2 capsules by mouth 2 (two) times daily., Disp: , Rfl:  .  Cholecalciferol (VITAMIN D3) 1000 UNITS CAPS, Take 1,000 Units by mouth 2 (two) times daily. , Disp: , Rfl:  .  diclofenac Sodium (VOLTAREN) 1 % GEL, Apply 2 g topically 4 (four) times daily. (for arthritis). if not covered, please  call, Disp: 400 g, Rfl: 0 .  levothyroxine (SYNTHROID) 100 MCG tablet, TAKE 1 TABLET BY MOUTH  DAILY, Disp: 90 tablet, Rfl: 3 .  mometasone (ELOCON) 0.1 % cream, Apply thin layer to dry, itchy patches on back ONCE daily x7-10days., Disp: 45 g, Rfl: 0 .  NONFORMULARY OR COMPOUNDED ITEM, Mix triamcinolone cream 0.1% in ceravae cream (1:3) # 464 grams (1 large jar): Apply to affected areas of legs daily., Disp: 464 each, Rfl: 1 .  Omega-3 Fatty Acids (FISH OIL) 1000 MG CAPS, Take 1,000 mg by mouth 2 (two) times daily., Disp: , Rfl:  .  risedronate (ACTONEL) 35 MG tablet, TAKE 1 TABLET BY MOUTH  WEEKLY WITH 8 OZ OF PLAIN  WATER 30 MINUTES BEFORE  FIRST FOOD, DRINK OR MEDS.  STAY UPRIGHT FOR 30 MINS, Disp:  12 tablet, Rfl: 0 .  triamterene-hydrochlorothiazide (MAXZIDE-25) 37.5-25 MG tablet, TAKE ONE-HALF TABLET BY  MOUTH DAILY, Disp: 45 tablet, Rfl: 3 Social History   Socioeconomic History  . Marital status: Widowed    Spouse name: Not on file  . Number of children: 2  . Years of education: 103  . Highest education level: 12th grade  Occupational History  . Occupation: retired-homemaker  Tobacco Use  . Smoking status: Never Smoker  . Smokeless tobacco: Never Used  Vaping Use  . Vaping Use: Never used  Substance and Sexual Activity  . Alcohol use: No  . Drug use: No  . Sexual activity: Not Currently  Other Topics Concern  . 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 No alcohol or caffeine or tobacco.   Social Determinants of Health   Financial Resource Strain: Not on file  Food Insecurity: Not on file  Transportation Needs: Not on file  Physical Activity: Not on file  Stress: Not on file  Social Connections: Not on file  Intimate Partner Violence: Not on file   Family History  Problem Relation Age of Onset  . Heart disease Mother        Heart failure per medical history form dated 09/27/09.  [name] Kitchen Heart disease Father        Heart attack per medical history form dated 09/27/09.  [name] Kitchen Heart attack Father   . Heart disease Brother        Heart failure per medical history form dated 09/27/09.  . Stroke Brother   . Stroke Brother   . Leukemia Brother   . Breast cancer Cousin   . Healthy Daughter   . Healthy Son   . Colon cancer Neg Hx     Review of Systems  Negative unless specially indicated above in HPI.  Objective: Office vital signs reviewed. BP (!) 148/80   Pulse 92   Temp (!) 97.3 F (36.3 C) (Temporal)   Ht 5' (1.524 m)   Wt 110 lb 2 oz (50 kg)   BMI 21.51 kg/m   Physical Examination:  Physical Exam Vitals and nursing note reviewed.  Constitutional:      General: She is not in acute distress.    Appearance:  She is not ill-appearing, toxic-appearing or diaphoretic.  Cardiovascular:     Rate and Rhythm: Normal rate and regular rhythm.     Heart sounds: Normal heart sounds. No murmur heard.   Pulmonary:     Effort: Pulmonary effort is normal. No respiratory distress.     Breath sounds: Normal breath sounds.  Abdominal:     General: Bowel  sounds are normal. There is no distension.     Palpations: Abdomen is soft.     Tenderness: There is no abdominal tenderness.  Musculoskeletal:     Lumbar back: No swelling, edema or deformity.     Right hip: No tenderness or bony tenderness.     Left hip: No tenderness or bony tenderness.     Right lower leg: 1+ Edema present.     Left lower leg: 1+ Edema present.     Comments: Tenderness to sacrum.   Skin:    General: Skin is warm and dry.     Capillary Refill: Capillary refill takes less than 2 seconds.  Neurological:     Mental Status: She is alert and oriented to person, place, and time.     Gait: Gait abnormal (walker).  Psychiatric:        Mood and Affect: Mood normal.        Behavior: Behavior normal.      Results for orders placed or performed in visit on 05/14/20  Thyroid Panel With TSH  Result Value Ref Range   TSH 3.930 0.450 - 4.500 uIU/mL   T4, Total 11.0 4.5 - 12.0 ug/dL   T3 Uptake Ratio 30 24 - 39 %   Free Thyroxine Index 3.3 1.2 - 4.9  Renal Function Panel  Result Value Ref Range   Glucose 150 (H) 65 - 99 mg/dL   BUN 23 10 - 36 mg/dL   Creatinine, Ser 0.99 0.57 - 1.00 mg/dL   GFR calc non Af Amer 48 (L) >59 mL/min/1.73   GFR calc Af Amer 56 (L) >59 mL/min/1.73   BUN/Creatinine Ratio 23 12 - 28   Sodium 139 134 - 144 mmol/L   Potassium 4.3 3.5 - 5.2 mmol/L   Chloride 96 96 - 106 mmol/L   CO2 28 20 - 29 mmol/L   Calcium 10.6 (H) 8.7 - 10.3 mg/dL   Phosphorus 3.4 3.0 - 4.3 mg/dL   Albumin 4.4 3.5 - 4.6 g/dL     Assessment/ Plan: Quandra was seen today for hip pain.  Diagnoses and all orders for this visit:  Coccyx  pain/Gluteal pain Radiology report pending. Continue tylenol, salonpas, heat, ice, rest for pain. Unable to prescribe stronger pain medication due to patient's age and history of falls. Currently doing PT at home, this is helping. Will notify patient of Xray results.  -     DG Pelvis 1-2 Views -     DG Sacrum/Coccyx; Future  Fall, subsequent encounter -     DG Pelvis 1-2 Views -     DG Sacrum/Coccyx; Future  Return to office for new or worsening symptoms, or if symptoms persist.   Follow up with PCP as needed.   The above assessment and management plan was discussed with the patient. The patient verbalized understanding of and has agreed to the management plan. Patient is aware to call the clinic if symptoms persist or worsen. Patient is aware when to return to the clinic for a follow-up visit. Patient educated on when it is appropriate to go to the emergency department.   Marjorie Smolder, FNP-C [facility] [address] [place], [place] [address] [phone]

## 2020-08-04 ENCOUNTER — Telehealth: Payer: Self-pay

## 2020-08-04 NOTE — Telephone Encounter (Signed)
Patient aware of xray results.   

## 2020-08-20 ENCOUNTER — Ambulatory Visit (INDEPENDENT_AMBULATORY_CARE_PROVIDER_SITE_OTHER): Payer: Medicare Other

## 2020-08-20 ENCOUNTER — Other Ambulatory Visit: Payer: Self-pay

## 2020-08-20 DIAGNOSIS — Z9181 History of falling: Secondary | ICD-10-CM

## 2020-08-20 DIAGNOSIS — I8312 Varicose veins of left lower extremity with inflammation: Secondary | ICD-10-CM

## 2020-08-20 DIAGNOSIS — M150 Primary generalized (osteo)arthritis: Secondary | ICD-10-CM

## 2020-08-20 DIAGNOSIS — I129 Hypertensive chronic kidney disease with stage 1 through stage 4 chronic kidney disease, or unspecified chronic kidney disease: Secondary | ICD-10-CM | POA: Diagnosis not present

## 2020-08-20 DIAGNOSIS — E039 Hypothyroidism, unspecified: Secondary | ICD-10-CM

## 2020-08-20 DIAGNOSIS — R2681 Unsteadiness on feet: Secondary | ICD-10-CM

## 2020-08-20 DIAGNOSIS — N183 Chronic kidney disease, stage 3 unspecified: Secondary | ICD-10-CM | POA: Diagnosis not present

## 2020-08-20 DIAGNOSIS — L89513 Pressure ulcer of right ankle, stage 3: Secondary | ICD-10-CM | POA: Diagnosis not present

## 2020-08-24 ENCOUNTER — Ambulatory Visit: Payer: Medicare Other | Admitting: Family Medicine

## 2020-08-31 ENCOUNTER — Ambulatory Visit: Payer: Medicare Other | Admitting: Licensed Clinical Social Worker

## 2020-08-31 DIAGNOSIS — E782 Mixed hyperlipidemia: Secondary | ICD-10-CM

## 2020-08-31 DIAGNOSIS — M810 Age-related osteoporosis without current pathological fracture: Secondary | ICD-10-CM

## 2020-08-31 DIAGNOSIS — N183 Chronic kidney disease, stage 3 unspecified: Secondary | ICD-10-CM

## 2020-08-31 DIAGNOSIS — I129 Hypertensive chronic kidney disease with stage 1 through stage 4 chronic kidney disease, or unspecified chronic kidney disease: Secondary | ICD-10-CM

## 2020-08-31 DIAGNOSIS — E039 Hypothyroidism, unspecified: Secondary | ICD-10-CM

## 2020-08-31 NOTE — Patient Instructions (Addendum)
Licensed Clinical Social Worker Visit Information  Goals we discussed today:  Goals Addressed              This Visit's Progress   .  Client will talk with LCSW in next 30 days about social work needs of client (pt-stated)        CARE PLAN ENTRY   Current Barriers:  . Vision challenges in client with Chronic Diagnoses of HLD, Hypothyroidism, Osteoporosis, IBS HTN, Macular Degeneration . Hearing challenges  Clinical Social Work Clinical Goal(s):  [name] Kitchen LCSW will call client in 30 days to discuss social work needs of client  Interventions:  Talked with client about pain issues of client Talked with client about physical therapy sessions of client Talked with client about appetite of client Talked with client about sleeping issues of client Talked with client about upcoming client medical appointments Talked with client about ambulation issues of client (client uses a walker to ambulate) Talked with [name], daughter of client about occasional client confusion Talked withJudy about vision challenges of client Talked withJudyabout home healthsupport for client through [place] Talked with [name] about ADLs completion of client Talked with [name] about hearing needs of client Talked with [name] about home health nursing support through [facility]  Patient Self Care Activities:   Attends scheduled medical appointments  Patient Self Care Deficits:   Hearing issues Vision hallenges .   Initial goal documentation      Materials Provided: No  Follow Up Plan:LCSW to call client/[name] Done, daughter,in next 4 weeks to talk with client/daughterabout the social work needs of client at that time  The patient/[name] Done, daughter of patient, verbalized understanding of instructions provided today and declined a print copy of patient instruction materials.   [name].Jamela Cumbo MSW, LCSW Licensed Clinical Social Worker [facility] Care  Management [phone]

## 2020-08-31 NOTE — Chronic Care Management (AMB) (Signed)
Chronic Care Management    Clinical Social Work Follow Up Note  08/31/2020 Name: TARISSA KERIN MRN: [id number] DOB: [birth year]  Janice Reed is a 87 y.o. year old female who is a primary care patient of [name], DO. The CCM team was consulted for assistance with Community resources.   Review of patient status, including review of consultants reports, other relevant assessments, and collaboration with appropriate care team members and the patient's provider was performed as part of comprehensive patient evaluation and provision of chronic care management services.    SDOH (Social Determinants of Health) assessments performed: No; risk for depression; risk for tobacco use; risk for stress; risk for physical inactivity  [place] Visit from 05/14/2020 in [place]  PHQ-9 Total Score 5      GAD 7 : Generalized Anxiety Score 05/14/2020 12/25/2019  Nervous, Anxious, on Edge 1 1  Control/stop worrying 1 1  Worry too much - different things 0 0  Trouble relaxing 1 0  Restless 0 0  Easily annoyed or irritable 0 0  Afraid - awful might happen 0 0  Total GAD 7 Score 3 2  Anxiety Difficulty Not difficult at all Somewhat difficult    Outpatient Encounter Medications as of 08/31/2020  Medication Sig Note  . acetaminophen (TYLENOL 8 HOUR ARTHRITIS PAIN) 650 MG CR tablet Take 650 mg by mouth every 8 (eight) hours as needed for pain.   [name] Kitchen aspirin 325 MG tablet Take 325 mg by mouth See admin instructions. Take one tablet a day and more if needed for pain - up to 3 times daily   . bevacizumab (AVASTIN) 1.25 mg/0.1 mL SOLN 1.25 mg by Intravitreal route.  06/17/2019: Dr Baird Cancer ( eye specialist )   . Calcium-Magnesium-Vitamin D (CITRACAL CALCIUM+D PO) Take 2 capsules by mouth 2 (two) times daily.   . Cholecalciferol (VITAMIN D3) 1000 UNITS CAPS Take 1,000 Units by mouth 2 (two) times daily.    . diclofenac Sodium (VOLTAREN) 1 % GEL Apply 2 g topically 4  (four) times daily. (for arthritis). if not covered, please call   . levothyroxine (SYNTHROID) 100 MCG tablet TAKE 1 TABLET BY MOUTH  DAILY   . mometasone (ELOCON) 0.1 % cream Apply thin layer to dry, itchy patches on back ONCE daily x7-10days.   . NONFORMULARY OR COMPOUNDED ITEM Mix triamcinolone cream 0.1% in ceravae cream (1:3) # 464 grams (1 large jar): Apply to affected areas of legs daily.   . Omega-3 Fatty Acids (FISH OIL) 1000 MG CAPS Take 1,000 mg by mouth 2 (two) times daily.   . risedronate (ACTONEL) 35 MG tablet TAKE 1 TABLET BY MOUTH  WEEKLY WITH 8 OZ OF PLAIN  WATER 30 MINUTES BEFORE  FIRST FOOD, DRINK OR MEDS.  STAY UPRIGHT FOR 30 MINS   . triamterene-hydrochlorothiazide (MAXZIDE-25) 37.5-25 MG tablet TAKE ONE-HALF TABLET BY  MOUTH DAILY    No facility-administered encounter medications on file as of 08/31/2020.     Goals Addressed              This Visit's Progress   .  Client will talk with LCSW in next 30 days about social work needs of client (pt-stated)        CARE PLAN ENTRY   Current Barriers:  . Vision challenges in client with Chronic Diagnoses of HLD, Hypothyroidism, Osteoporosis, IBS HTN, Macular Degeneration . Hearing challenges  Clinical Social Work Clinical Goal(s):  [name] Kitchen LCSW will call client in 37  days to discuss social work needs of client  Interventions:  Talked with client about pain issues of client Talked with client about physical therapy sessions of client Talked with client about appetite of client Talked with client about sleeping issues of client Talked with client about upcoming client medical appointments Talked with client about ambulation issues of client (client uses a walker to ambulate) Talked with [name], daughter of client about occasional client confusion Talked with [name] about vision challenges of client Talked with [name] about home health support for client through [place] Talked with [name] about ADLs completion of  client Talked with [name] about hearing needs of client Talked with [name] about home health nursing support through [facility]  Patient Self Care Activities:   Attends scheduled medical appointments  Patient Self Care Deficits:   Hearing issues Vision hallenges .   Initial goal documentation        Follow Up Plan: LCSW to call client/[name] Done, daughter,in next 4 weeks to talk with client/daughterabout the social work needs of client at that time  [name].Kemyra August MSW, LCSW Licensed Clinical Social Worker [facility] [phone]

## 2020-09-14 ENCOUNTER — Ambulatory Visit (INDEPENDENT_AMBULATORY_CARE_PROVIDER_SITE_OTHER): Payer: Medicare Other | Admitting: Family Medicine

## 2020-09-14 ENCOUNTER — Other Ambulatory Visit: Payer: Self-pay

## 2020-09-14 ENCOUNTER — Encounter: Payer: Self-pay | Admitting: Family Medicine

## 2020-09-14 VITALS — BP 131/74 | HR 72 | Temp 97.4°F | Ht 60.0 in | Wt 110.0 lb

## 2020-09-14 DIAGNOSIS — M25551 Pain in right hip: Secondary | ICD-10-CM

## 2020-09-14 DIAGNOSIS — I129 Hypertensive chronic kidney disease with stage 1 through stage 4 chronic kidney disease, or unspecified chronic kidney disease: Secondary | ICD-10-CM | POA: Diagnosis not present

## 2020-09-14 DIAGNOSIS — Z8719 Personal history of other diseases of the digestive system: Secondary | ICD-10-CM

## 2020-09-14 DIAGNOSIS — R739 Hyperglycemia, unspecified: Secondary | ICD-10-CM

## 2020-09-14 DIAGNOSIS — E039 Hypothyroidism, unspecified: Secondary | ICD-10-CM

## 2020-09-14 DIAGNOSIS — N183 Chronic kidney disease, stage 3 unspecified: Secondary | ICD-10-CM

## 2020-09-14 LAB — BAYER DCA HB A1C WAIVED: HB A1C (BAYER DCA - WAIVED): 5.9 % (ref <7.0)

## 2020-09-14 NOTE — Patient Instructions (Signed)
Sugar looks good We talked about thyroid sometimes causing constipation. We are checking this today Ok to use a stool softener daily if needed I will try and get up with your physical therapist to work on pelvis and hip I'd like you to continue the heating pad if needed for pain.

## 2020-09-14 NOTE — Progress Notes (Signed)
Subjective: CC: Tailbone pain, right hip pain PCP: Janice Norlander, DO IWO:Janice Reed is a 87 y.o. female presenting to clinic today for:  1. Tailbone pain, right hip pain Patient reports that she sustained a fall back in October 2021. She was not evaluated post fall but has since been having some ongoing tailbone pain, constipation and right hip pain but sometimes can be severe. The right hip pain has been particularly uncomfortable for the last couple of weeks. She denies any preceding injury from the right hip standpoint but notes that sometimes it is in her low back as well. She does have a physical therapist that comes out to the home and works with her and they have done strengthening but have not necessarily focused on her back or hip. Does not report any sensory changes. No recurrent falls. Using topical anti-inflammatories for relief but again not super helpful. Denies any hematochezia, melena.  2. Hypertension with CKD 3 Patient is compliant with her medications. No reports of chest pain, shortness of breath. She had a fall as above.  3. Hypothyroidism Patient reports compliance with her thyroid medicine. No reports of tremor, heart palpitations, change in weight but she does report constipation as above since her fall.   ROS: Per HPI  Allergies  Allergen Reactions  . Ciprofloxacin Anaphylaxis  . Benicar [Olmesartan Medoxomil] Other (See Comments)    Pt doesn't remember reaction  . Clarithromycin Other (See Comments)    Pt doesn't remember reaction  . Sulfa Antibiotics Other (See Comments)    Dr told patient not to take any more  . Alendronate Sodium Other (See Comments)    Upset stomach  . Celebrex [Celecoxib] Other (See Comments)    Dr said that it was too strong for her  . Penicillins Rash   Past Medical History:  Diagnosis Date  . Arthritis   . Arthritis pain   . Benign hypertensive heart disease   . Breast cancer ([place])   . Cancer of right breast ([place])  09/28/2011   [facility];  T2, N0 ([facility]+);   ER+;  Her-2 neg.,  Right PM,SLN  09/08/08   . Collagenous colitis   . Diverticulosis of colon   . External hemorrhoid   . Hearing loss   . Hypertension   . IBS (irritable bowel syndrome)   . Macular degeneration   . Macular degeneration of both eyes   . Neuropathy, peripheral   . Nonspecific abnormal electrocardiogram (ECG) (EKG)   . Osteoporosis   . Other and unspecified hyperlipidemia   . Palpitations   . Phlebitis and thrombophlebitis of unspecified site   . Prolapse of vaginal walls without mention of uterine prolapse   . Shingles   . Symptomatic menopausal or female climacteric states   . Unspecified hypothyroidism     Current Outpatient Medications:  .  acetaminophen (TYLENOL 8 HOUR ARTHRITIS PAIN) 650 MG CR tablet, Take 650 mg by mouth every 8 (eight) hours as needed for pain., Disp: , Rfl:  .  aspirin 325 MG tablet, Take 325 mg by mouth See admin instructions. Take one tablet a day and more if needed for pain - up to 3 times daily, Disp: , Rfl:  .  bevacizumab (AVASTIN) 1.25 mg/0.1 mL SOLN, 1.25 mg by Intravitreal route. , Disp: , Rfl:  .  Calcium-Magnesium-Vitamin D (CITRACAL CALCIUM+D PO), Take 2 capsules by mouth 2 (two) times daily., Disp: , Rfl:  .  Cholecalciferol (VITAMIN D3) 1000 UNITS CAPS, Take 1,000 Units by  mouth 2 (two) times daily. , Disp: , Rfl:  .  diclofenac Sodium (VOLTAREN) 1 % GEL, Apply 2 g topically 4 (four) times daily. (for arthritis). if not covered, please call, Disp: 400 g, Rfl: 0 .  levothyroxine (SYNTHROID) 100 MCG tablet, TAKE 1 TABLET BY MOUTH  DAILY, Disp: 90 tablet, Rfl: 3 .  mometasone (ELOCON) 0.1 % cream, Apply thin layer to dry, itchy patches on back ONCE daily x7-10days., Disp: 45 g, Rfl: 0 .  NONFORMULARY OR COMPOUNDED ITEM, Mix triamcinolone cream 0.1% in ceravae cream (1:3) # 464 grams (1 large jar): Apply to affected areas of legs daily., Disp: 464 each, Rfl: 1 .  Omega-3 Fatty Acids (FISH OIL) 1000  MG CAPS, Take 1,000 mg by mouth 2 (two) times daily., Disp: , Rfl:  .  risedronate (ACTONEL) 35 MG tablet, TAKE 1 TABLET BY MOUTH  WEEKLY WITH 8 OZ OF PLAIN  WATER 30 MINUTES BEFORE  FIRST FOOD, DRINK OR MEDS.  STAY UPRIGHT FOR 30 MINS, Disp: 12 tablet, Rfl: 0 .  triamterene-hydrochlorothiazide (MAXZIDE-25) 37.5-25 MG tablet, TAKE ONE-HALF TABLET BY  MOUTH DAILY, Disp: 45 tablet, Rfl: 3 Social History   Socioeconomic History  . Marital status: Widowed    Spouse name: Not on file  . Number of children: 2  . Years of education: 87  . Highest education level: 12th grade  Occupational History  . Occupation: retired-homemaker  Tobacco Use  . Smoking status: Never Smoker  . Smokeless tobacco: Never Used  Vaping Use  . Vaping Use: Never used  Substance and Sexual Activity  . Alcohol use: No  . Drug use: No  . Sexual activity: Not Currently  Other Topics Concern  . 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 No alcohol or caffeine or tobacco.   Social Determinants of Health   Financial Resource Strain: Not on file  Food Insecurity: Not on file  Transportation Needs: Not on file  Physical Activity: Not on file  Stress: Not on file  Social Connections: Not on file  Intimate Partner Violence: Not on file   Family History  Problem Relation Age of Onset  . Heart disease Mother        Heart failure per medical history form dated 09/27/09.  [name] Kitchen Heart disease Father        Heart attack per medical history form dated 09/27/09.  [name] Kitchen Heart attack Father   . Heart disease Brother        Heart failure per medical history form dated 09/27/09.  . Stroke Brother   . Stroke Brother   . Leukemia Brother   . Breast cancer Cousin   . Healthy Daughter   . Healthy Son   . Colon cancer Neg Hx     Objective: Office vital signs reviewed. BP 131/74   Pulse 72   Temp (!) 97.4 F (36.3 C)   Ht 5' (1.524 m)   Wt 110 lb (49.9 kg)   SpO2 97%   BMI  21.48 kg/m   Physical Examination:  General: Awake, alert, thin frail elderly female. No acute distress HEENT: Normal, sclera white; no goiter. No exophthalmos Cardio: regular rate and rhythm  Pulm: clear to auscultation bilaterally, no wheezes, rhonchi or rales; normal work of breathing on room air MSK: Slow, antalgic gait. No palpable deformities within the low back, hip or over greater trochanter. She does have some tenderness palpation in these areas though.  Assessment/ Plan:  87 y.o. female   Acute right hip pain  Benign hypertension with chronic kidney disease, stage III (HCC)  Acquired hypothyroidism - Plan: Thyroid Panel With TSH  Serum calcium elevated - Plan: PTH, Intact and Calcium  Blood glucose elevated - Plan: Bayer DCA Hb A1c Waived  History of GI bleed - Plan: CBC  I will see if we can reach out to her home health provider so that we can get them to focus on that right hip and sacrum. I do wonder if the right hip pain is related to her falls such that she has caused some somatic dysfunction of the pelvis and is getting more strain on the right hip. She reports also has a history of scoliosis which certainly is contributing. Encouraged her to use heating pad if needed for pain  She is asymptomatic except for constipation from a thyroid standpoint. Check thyroid panel. We discussed okay to use stool softener daily if needed  Check calcium and PTH level given elevated calcium noted on last lab check  Sugar was not discussed during today's visit but will check an A1c since she is had elevation of sugar in the past  Given history of GIB in the past also will check CBC  No orders of the defined types were placed in this encounter.  No orders of the defined types were placed in this encounter.    Janice Norlander, DO [place] [phone]

## 2020-09-15 ENCOUNTER — Other Ambulatory Visit: Payer: Self-pay | Admitting: Family Medicine

## 2020-09-15 LAB — PTH, INTACT AND CALCIUM
Calcium: 9.3 mg/dL (ref 8.7–10.3)
PTH: 38 pg/mL (ref 15–65)

## 2020-09-15 LAB — CBC
Hematocrit: 41.6 % (ref 34.0–46.6)
Hemoglobin: 14.2 g/dL (ref 11.1–15.9)
MCH: 31.2 pg (ref 26.6–33.0)
MCHC: 34.1 g/dL (ref 31.5–35.7)
MCV: 91 fL (ref 79–97)
Platelets: 223 10*3/uL (ref 150–450)
RBC: 4.55 x10E6/uL (ref 3.77–5.28)
RDW: 13.1 % (ref 11.7–15.4)
WBC: 6.6 10*3/uL (ref 3.4–10.8)

## 2020-09-15 LAB — THYROID PANEL WITH TSH
Free Thyroxine Index: 3 (ref 1.2–4.9)
T3 Uptake Ratio: 30 % (ref 24–39)
T4, Total: 10 ug/dL (ref 4.5–12.0)
TSH: 7.03 u[IU]/mL — ABNORMAL HIGH (ref 0.450–4.500)

## 2020-10-04 ENCOUNTER — Ambulatory Visit (INDEPENDENT_AMBULATORY_CARE_PROVIDER_SITE_OTHER): Payer: Medicare Other | Admitting: Licensed Clinical Social Worker

## 2020-10-04 DIAGNOSIS — E039 Hypothyroidism, unspecified: Secondary | ICD-10-CM | POA: Diagnosis not present

## 2020-10-04 DIAGNOSIS — N183 Chronic kidney disease, stage 3 unspecified: Secondary | ICD-10-CM

## 2020-10-04 DIAGNOSIS — I129 Hypertensive chronic kidney disease with stage 1 through stage 4 chronic kidney disease, or unspecified chronic kidney disease: Secondary | ICD-10-CM

## 2020-10-04 DIAGNOSIS — M810 Age-related osteoporosis without current pathological fracture: Secondary | ICD-10-CM

## 2020-10-04 DIAGNOSIS — E782 Mixed hyperlipidemia: Secondary | ICD-10-CM

## 2020-10-04 NOTE — Chronic Care Management (AMB) (Signed)
Chronic Care Management    Clinical Social Work Follow Up Note  10/04/2020 Name: Janice Reed MRN: [id number] DOB: [birth year]  Janice Reed is a 87 y.o. year old female who is a primary care patient of Janice Norlander, DO. The CCM team was consulted for assistance with [name] .   Review of patient status, including review of consultants reports, other relevant assessments, and collaboration with appropriate care team members and the patient's provider was performed as part of comprehensive patient evaluation and provision of chronic care management services.    SDOH (Social Determinants of Health) assessments performed: No; risk for depression; risk for physical inactivity; risk for tobacco use; risk for stress;   [facility] Office Visit from 05/14/2020 in [facility]  PHQ-9 Total Score 5     GAD 7 : Generalized Anxiety Score 05/14/2020 12/25/2019  Nervous, Anxious, on Edge 1 1  Control/stop worrying 1 1  Worry too much - different things 0 0  Trouble relaxing 1 0  Restless 0 0  Easily annoyed or irritable 0 0  Afraid - awful might happen 0 0  Total GAD 7 Score 3 2  Anxiety Difficulty Not difficult at all Somewhat difficult    Outpatient Encounter Medications as of 10/04/2020  Medication Sig Note  . acetaminophen (TYLENOL) 650 MG CR tablet Take 650 mg by mouth every 8 (eight) hours as needed for pain.   [name] Kitchen aspirin 325 MG tablet Take 325 mg by mouth See admin instructions. Take one tablet a day and more if needed for pain - up to 3 times daily   . bevacizumab (AVASTIN) 1.25 mg/0.1 mL SOLN 1.25 mg by Intravitreal route.  06/17/2019: Dr Baird Cancer ( eye specialist )   . Calcium-Magnesium-Vitamin D (CITRACAL CALCIUM+D PO) Take 2 capsules by mouth 2 (two) times daily.   . Cholecalciferol (VITAMIN D3) 1000 UNITS CAPS Take 1,000 Units by mouth 2 (two) times daily.    . diclofenac Sodium (VOLTAREN) 1 % GEL Apply 2 g topically 4 (four) times daily. (for  arthritis). if not covered, please call   . levothyroxine (SYNTHROID) 100 MCG tablet TAKE 1 TABLET BY MOUTH  DAILY   . mometasone (ELOCON) 0.1 % cream Apply thin layer to dry, itchy patches on back ONCE daily x7-10days.   . NONFORMULARY OR COMPOUNDED ITEM Mix triamcinolone cream 0.1% in ceravae cream (1:3) # 464 grams (1 large jar): Apply to affected areas of legs daily.   . Omega-3 Fatty Acids (FISH OIL) 1000 MG CAPS Take 1,000 mg by mouth 2 (two) times daily.   . risedronate (ACTONEL) 35 MG tablet TAKE 1 TABLET BY MOUTH  WEEKLY WITH 8 OUNCE OF  PLAIN WATER 1/2 HOUR BEFORE FIRST FOOD DRINK OR MEDS.  STAY UPRIGHT FOR 1/2 HOUR   . triamterene-hydrochlorothiazide (MAXZIDE-25) 37.5-25 MG tablet TAKE ONE-HALF TABLET BY  MOUTH DAILY    No facility-administered encounter medications on file as of 10/04/2020.    Goals    .  Client will talk with LCSW in next 30 days about social work needs of client (pt-stated)      CARE PLAN ENTRY   Current Barriers:  . Vision challenges in client with Chronic Diagnoses of HLD, Hypothyroidism, Osteoporosis, IBS HTN, Macular Degeneration . Hearing challenges  Clinical Social Work Clinical Goal(s):  [name] Kitchen LCSW will call client in 30 days to discuss social work needs of client  Interventions:  Talked with Janice Reed, daughter of client, about client needs Talked with  Janice Reed about client completion of physical therapy sessions  Talked with Janice Reed about home care needs of client Talked with Janice Reed about ambulation needs of client (client uses a walker as needed) Talked with Janice Reed about appetite of client Talked with Janice Reed about medication procurement of client Talked with Janice Reed about CAP program support with client Talked with Janice Reed  about vision challenges of client Talked with Janice Reed about pain issues of client Talked with Janice Reed about fall history of client Talked with Janice Reed  about transport needs of client Talked with Janice Reed about ADLs completion of client Talked with Janice Reed  about sleeping issues of client Talked with Janice Reed about upcoming client appointments Talked with Janice Reed about standing difficulty of client Talked with Janice Reed about memory issues of client (client may leave faucet running,family is a little concerned now about her still trying to cook from a safety perspective)   Patient Self Care Activities:   Attends scheduled medical appointments  Patient Self Care Deficits:   Hearing issues Vision hallenges .   Initial goal documentation     Follow Up Plan:LCSW to call client/Janice Reed, daughter,in next 4 weeks to talk with client/daughterabout the social work needs of client at that time  [name].Lavone Weisel MSW, LCSW Licensed Clinical Social Worker [facility] Care Management [phone]

## 2020-10-04 NOTE — Patient Instructions (Addendum)
Licensed Clinical Social Worker Visit Information  Goals we discussed today:  .  Client will talk with LCSW in next 30 days about social work needs of client (pt-stated)        CARE PLAN ENTRY   Current Barriers:   Vision challenges in client with Chronic Diagnoses of HLD, Hypothyroidism, Osteoporosis, IBS HTN, Macular Degeneration  Hearing challenges  Clinical Social Work Clinical Goal(s):   LCSW will call client in 30 days to discuss social work needs of client  Interventions:  Talked with [name], daughter of client, about client needs Talked with [name] about client completion of physical therapy sessions  Talked with [name] about home care needs of client Talked with [name] about ambulation needs of client (client uses a walker as needed) Talked with [name] about appetite of client Talked with [name] about medication procurement of client Talked with [name] about CAP program support with client Talked with [name]  about vision challenges of client Talked with [name] about pain issues of client Talked with [name] about fall history of client Talked with [name]  about transport needs of client Talked with [name] about ADLs completion of client Talked with [name] about sleeping issues of client Talked with [name] about upcoming client appointments Talked with [name] about standing difficulty of client Talked with [name] about memory issues of client (client may leave faucet running,family is a little concerned now about her still trying to cook from a safety perspective)   Patient Self Care Activities:   Attends scheduled medical appointments  Patient Self Care Deficits:   Hearing issues Vision hallenges    Initial goal documentation     Follow Up Plan:LCSW to call client/[name] Done, daughter,in next 4 weeks to talk with client/daughterabout the social work needs of client at that time  Materials Provided: No  The patient/[name] Done, daughter of patient,  verbalized understanding of instructions provided today and declined a print copy of patient instruction materials.   [name].Onie Hayashi MSW, LCSW Licensed Clinical Social Worker [facility] Care Management [phone]

## 2020-11-03 ENCOUNTER — Telehealth: Payer: Medicare Other

## 2020-11-24 ENCOUNTER — Telehealth: Payer: Medicare Other

## 2020-12-13 ENCOUNTER — Ambulatory Visit (INDEPENDENT_AMBULATORY_CARE_PROVIDER_SITE_OTHER): Payer: Medicare Other

## 2020-12-13 VITALS — Ht 60.0 in | Wt 111.0 lb

## 2020-12-13 DIAGNOSIS — Z0000 Encounter for general adult medical examination without abnormal findings: Secondary | ICD-10-CM

## 2020-12-13 NOTE — Patient Instructions (Signed)
Janice Reed , Thank you for taking time to come for your Medicare Wellness Visit. I appreciate your ongoing commitment to your health goals. Please review the following plan we discussed and let me know if I can assist you in the future.   Screening recommendations/referrals: Colonoscopy: Done 02/23/2012 - Repeat not required Mammogram: Done 11/12/2015 - Repeat not required Bone Density: Done 11/09/2016 - repeat every 2 years Recommended yearly ophthalmology/optometry visit for glaucoma screening and checkup Recommended yearly dental visit for hygiene and checkup  Vaccinations: Influenza vaccine: Done 05/14/2020 - Repeat annually Pneumococcal vaccine: Done 06/04/2013 and 08/07/1998 Tdap vaccine: Done 02/05/2007 - DUE for repeat (every 10 years) Shingles vaccine: Shingrix discussed. Please contact your pharmacy for coverage information.    Covid-19:Done 09/12/19, 10/11/19, 06/15/20  Advanced directives: Please bring a copy of your health care power of attorney and living will to the office to be added to your chart at your convenience.  Conditions/risks identified: Aim for 30 minutes of exercise and/or stretching and weight bearing exercise each day, drink 6-8 glasses of water and eat lots of fruits and vegetables. Continue fall prevention.  Next appointment: Follow up in one year for your annual wellness visit    Preventive Care 65 Years and Older, Female Preventive care refers to lifestyle choices and visits with your health care provider that can promote health and wellness. What does preventive care include?  A yearly physical exam. This is also called an annual well check.  Dental exams once or twice a year.  Routine eye exams. Ask your health care provider how often you should have your eyes checked.  Personal lifestyle choices, including:  Daily care of your teeth and gums.  Regular physical activity.  Eating a healthy diet.  Avoiding tobacco and drug use.  Limiting alcohol  use.  Practicing safe sex.  Taking low-dose aspirin every day.  Taking vitamin and mineral supplements as recommended by your health care provider. What happens during an annual well check? The services and screenings done by your health care provider during your annual well check will depend on your age, overall health, lifestyle risk factors, and family history of disease. Counseling  Your health care provider may ask you questions about your:  Alcohol use.  Tobacco use.  Drug use.  Emotional well-being.  Home and relationship well-being.  Sexual activity.  Eating habits.  History of falls.  Memory and ability to understand (cognition).  Work and work Statistician.  Reproductive health. Screening  You may have the following tests or measurements:  Height, weight, and BMI.  Blood pressure.  Lipid and cholesterol levels. These may be checked every 5 years, or more frequently if you are over 70 years old.  Skin check.  Lung cancer screening. You may have this screening every year starting at age 2 if you have a 30-pack-year history of smoking and currently smoke or have quit within the past 15 years.  Fecal occult blood test (FOBT) of the stool. You may have this test every year starting at age 14.  Flexible sigmoidoscopy or colonoscopy. You may have a sigmoidoscopy every 5 years or a colonoscopy every 10 years starting at age 58.  Hepatitis C blood test.  Hepatitis B blood test.  Sexually transmitted disease (STD) testing.  Diabetes screening. This is done by checking your blood sugar (glucose) after you have not eaten for a while (fasting). You may have this done every 1-3 years.  Bone density scan. This is done to screen for osteoporosis.  You may have this done starting at age 45.  Mammogram. This may be done every 1-2 years. Talk to your health care provider about how often you should have regular mammograms. Talk with your health care provider about  your test results, treatment options, and if necessary, the need for more tests. Vaccines  Your health care provider may recommend certain vaccines, such as:  Influenza vaccine. This is recommended every year.  Tetanus, diphtheria, and acellular pertussis (Tdap, Td) vaccine. You may need a Td booster every 10 years.  Zoster vaccine. You may need this after age 46.  Pneumococcal 13-valent conjugate (PCV13) vaccine. One dose is recommended after age 45.  Pneumococcal polysaccharide (PPSV23) vaccine. One dose is recommended after age 42. Talk to your health care provider about which screenings and vaccines you need and how often you need them. This information is not intended to replace advice given to you by your health care provider. Make sure you discuss any questions you have with your health care provider. Document Released: 08/20/2015 Document Revised: 04/12/2016 Document Reviewed: 05/25/2015 Elsevier Interactive Patient Education  2017 [place] Prevention in the Home Falls can cause injuries. They can happen to people of all ages. There are many things you can do to make your home safe and to help prevent falls. What can I do on the outside of my home?  Regularly fix the edges of walkways and driveways and fix any cracks.  Remove anything that might make you trip as you walk through a door, such as a raised step or threshold.  Trim any bushes or trees on the path to your home.  Use bright outdoor lighting.  Clear any walking paths of anything that might make someone trip, such as rocks or tools.  Regularly check to see if handrails are loose or broken. Make sure that both sides of any steps have handrails.  Any raised decks and porches should have guardrails on the edges.  Have any leaves, snow, or ice cleared regularly.  Use sand or salt on walking paths during winter.  Clean up any spills in your garage right away. This includes oil or grease spills. What  can I do in the bathroom?  Use night lights.  Install grab bars by the toilet and in the tub and shower. Do not use towel bars as grab bars.  Use non-skid mats or decals in the tub or shower.  If you need to sit down in the shower, use a plastic, non-slip stool.  Keep the floor dry. Clean up any water that spills on the floor as soon as it happens.  Remove soap buildup in the tub or shower regularly.  Attach bath mats securely with double-sided non-slip rug tape.  Do not have throw rugs and other things on the floor that can make you trip. What can I do in the bedroom?  Use night lights.  Make sure that you have a light by your bed that is easy to reach.  Do not use any sheets or blankets that are too big for your bed. They should not hang down onto the floor.  Have a firm chair that has side arms. You can use this for support while you get dressed.  Do not have throw rugs and other things on the floor that can make you trip. What can I do in the kitchen?  Clean up any spills right away.  Avoid walking on wet floors.  Keep items that you use a  lot in easy-to-reach places.  If you need to reach something above you, use a strong step stool that has a grab bar.  Keep electrical cords out of the way.  Do not use floor polish or wax that makes floors slippery. If you must use wax, use non-skid floor wax.  Do not have throw rugs and other things on the floor that can make you trip. What can I do with my stairs?  Do not leave any items on the stairs.  Make sure that there are handrails on both sides of the stairs and use them. Fix handrails that are broken or loose. Make sure that handrails are as long as the stairways.  Check any carpeting to make sure that it is firmly attached to the stairs. Fix any carpet that is loose or worn.  Avoid having throw rugs at the top or bottom of the stairs. If you do have throw rugs, attach them to the floor with carpet tape.  Make sure  that you have a light switch at the top of the stairs and the bottom of the stairs. If you do not have them, ask someone to add them for you. What else can I do to help prevent falls?  Wear shoes that:  Do not have high heels.  Have rubber bottoms.  Are comfortable and fit you well.  Are closed at the toe. Do not wear sandals.  If you use a stepladder:  Make sure that it is fully opened. Do not climb a closed stepladder.  Make sure that both sides of the stepladder are locked into place.  Ask someone to hold it for you, if possible.  Clearly mark and make sure that you can see:  Any grab bars or handrails.  First and last steps.  Where the edge of each step is.  Use tools that help you move around (mobility aids) if they are needed. These include:  Canes.  Walkers.  Scooters.  Crutches.  Turn on the lights when you go into a dark area. Replace any light bulbs as soon as they burn out.  Set up your furniture so you have a clear path. Avoid moving your furniture around.  If any of your floors are uneven, fix them.  If there are any pets around you, be aware of where they are.  Review your medicines with your doctor. Some medicines can make you feel dizzy. This can increase your chance of falling. Ask your doctor what other things that you can do to help prevent falls. This information is not intended to replace advice given to you by your health care provider. Make sure you discuss any questions you have with your health care provider. Document Released: 05/20/2009 Document Revised: 12/30/2015 Document Reviewed: 08/28/2014 Elsevier Interactive Patient Education  2017 [employer].

## 2020-12-13 NOTE — Progress Notes (Signed)
Subjective:   Janice Reed is a 87 y.o. female who presents for Medicare Annual (Subsequent) preventive examination.  Virtual Visit via Telephone Note  I connected with  Janice Reed on 12/13/20 at  1:15 PM EDT by telephone and verified that I am speaking with the correct person using two identifiers.  Location: Patient: Home Provider: [facility] Persons participating in the virtual visit: patient/Nurse Health Advisor   I discussed the limitations, risks, security and privacy concerns of performing an evaluation and management service by telephone and the availability of in person appointments. The patient expressed understanding and agreed to proceed.  Interactive audio and video telecommunications were attempted between this nurse and patient, however failed, due to patient having technical difficulties OR patient did not have access to video capability.  We continued and completed visit with audio only.  Some vital signs may be absent or patient reported.   Mischa Pollard E Kwasi Joung, LPN   Review of Systems     Cardiac Risk Factors include: advanced age (>62mn, >>62women);sedentary lifestyle;dyslipidemia;hypertension     Objective:    Today's Vitals   12/13/20 1240 12/13/20 1241  Weight: 111 lb (50.3 kg)   Height: 5' (1.524 m)   PainSc:  4    Body mass index is 21.68 kg/m.  Advanced Directives 12/13/2020 12/11/2019 12/10/2018 12/03/2017 12/05/2016 11/29/2016 11/09/2015  Does Patient Have a Medical Advance Directive? Yes No Yes Yes Yes Yes No  Type of AParamed[place]of [place] will - [place]Living will [place]Living will [place]Living will [place]Living will -  Does patient want to make changes to medical advance directive? - - No - Guardian declined No - Patient declined - - -  Copy of [place]even [place] Chart? No - copy requested - No - copy requested No - copy  requested - No - copy requested -  Would patient like information on creating a medical advance directive? - No - Patient declined - - - - No - patient declined information    Current Medications (verified) Outpatient Encounter Medications as of 12/13/2020  Medication Sig  . aspirin 325 MG tablet Take 325 mg by mouth See admin instructions. Take one tablet a day and more if needed for pain - up to 3 times daily  . bevacizumab (AVASTIN) 1.25 mg/0.1 mL SOLN 1.25 mg by Intravitreal route.   . Calcium-Magnesium-Vitamin D (CITRACAL CALCIUM+D PO) Take 2 capsules by mouth 2 (two) times daily.  . Cholecalciferol (VITAMIN D3) 1000 UNITS CAPS Take 1,000 Units by mouth 2 (two) times daily.   .[name] Kitchenlevothyroxine (SYNTHROID) 100 MCG tablet TAKE 1 TABLET BY MOUTH  DAILY  . Multiple Vitamins-Minerals (PRESERVISION AREDS PO) Take by mouth.  . Omega-3 Fatty Acids (FISH OIL) 1000 MG CAPS Take 1,000 mg by mouth 2 (two) times daily.  .[name] Kitchentriamterene-hydrochlorothiazide (MAXZIDE-25) 37.5-25 MG tablet TAKE ONE-HALF TABLET BY  MOUTH DAILY  . acetaminophen (TYLENOL) 650 MG CR tablet Take 650 mg by mouth every 8 (eight) hours as needed for pain. (Patient not taking: Reported on 12/13/2020)  . diclofenac Sodium (VOLTAREN) 1 % GEL Apply 2 g topically 4 (four) times daily. (for arthritis). if not covered, please call (Patient not taking: Reported on 12/13/2020)  . mometasone (ELOCON) 0.1 % cream Apply thin layer to dry, itchy patches on back ONCE daily x7-10days. (Patient not taking: Reported on 12/13/2020)  . NONFORMULARY OR COMPOUNDED ITEM Mix triamcinolone cream 0.1% in  ceravae cream (1:3) # 464 grams (1 large jar): Apply to affected areas of legs daily. (Patient not taking: Reported on 12/13/2020)  . risedronate (ACTONEL) 35 MG tablet TAKE 1 TABLET BY MOUTH  WEEKLY WITH 8 OUNCE OF  PLAIN WATER 1/2 HOUR BEFORE FIRST FOOD DRINK OR MEDS.  STAY UPRIGHT FOR 1/2 HOUR (Patient not taking: Reported on 12/13/2020)   No facility-administered  encounter medications on file as of 12/13/2020.    Allergies (verified) Ciprofloxacin, Benicar [olmesartan medoxomil], Clarithromycin, Sulfa antibiotics, Alendronate sodium, Celebrex [celecoxib], and Penicillins   History: Past Medical History:  Diagnosis Date  . Arthritis   . Arthritis pain   . Benign hypertensive heart disease   . Breast cancer ([place])   . Cancer of right breast ([place]) 09/28/2011   IDC;  T2, N0 (IHC+);   ER+;  Her-2 neg.,  Right PM,SLN  09/08/08   . Collagenous colitis   . Diverticulosis of colon   . External hemorrhoid   . Hearing loss   . Hypertension   . IBS (irritable bowel syndrome)   . Macular degeneration   . Macular degeneration of both eyes   . Neuropathy, peripheral   . Nonspecific abnormal electrocardiogram (ECG) (EKG)   . Osteoporosis   . Other and unspecified hyperlipidemia   . Palpitations   . Phlebitis and thrombophlebitis of unspecified site   . Prolapse of vaginal walls without mention of uterine prolapse   . Shingles   . Symptomatic menopausal or female climacteric states   . Unspecified hypothyroidism    Past Surgical History:  Procedure Laterality Date  . BACK SURGERY    . BREAST LUMPECTOMY     per medical history form dated 09/27/09.  [name] Kitchen CARPAL TUNNEL RELEASE     bilateral  . COLONOSCOPY  12/03/2002   Dr. Silvano Rusk  . NEUROPLASTY / TRANSPOSITION MEDIAN NERVE AT CARPAL TUNNEL BILATERAL    . TONSILLECTOMY AND ADENOIDECTOMY    . VAGINAL HYSTERECTOMY     Family History  Problem Relation Age of Onset  . Heart disease Mother        Heart failure per medical history form dated 09/27/09.  [name] Kitchen Heart disease Father        Heart attack per medical history form dated 09/27/09.  [name] Kitchen Heart attack Father   . Heart disease Brother        Heart failure per medical history form dated 09/27/09.  . Stroke Brother   . Stroke Brother   . Leukemia Brother   . Breast cancer Cousin   . Healthy Daughter   . Healthy Son   . Colon cancer Neg Hx    Social  History   Socioeconomic History  . Marital status: Widowed    Spouse name: Not on file  . Number of children: 2  . Years of education: 16  . Highest education level: 12th grade  Occupational History  . Occupation: retired-homemaker  Tobacco Use  . Smoking status: Never Smoker  . Smokeless tobacco: Never Used  Vaping Use  . Vaping Use: Never used  Substance and Sexual Activity  . Alcohol use: No  . Drug use: No  . Sexual activity: Not Currently  Other Topics Concern  . 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 No alcohol or caffeine or tobacco.   She lives alone; Her daughter lives next door   Daughter, [name] is [employer] POA   She has a Actuary, [place] 5  hours a day, 5 days per week.   Social Determinants of Health   Financial Resource Strain: Low Risk   . Difficulty of Paying Living Expenses: Not hard at all  Food Insecurity: No Food Insecurity  . Worried About Charity fundraiser in the Last Year: Never true  . Ran Out of Food in the Last Year: Never true  Transportation Needs: No Transportation Needs  . Lack of Transportation (Medical): No  . Lack of Transportation (Non-Medical): No  Physical Activity: Insufficiently Active  . Days of Exercise per Week: 7 days  . Minutes of Exercise per Session: 20 min  Stress: No Stress Concern Present  . Feeling of Stress : Only a little  Social Connections: Moderately Integrated  . Frequency of Communication with Friends and Family: More than three times a week  . Frequency of Social Gatherings with Friends and Family: More than three times a week  . Attends Religious Services: More than 4 times per year  . Active Member of Clubs or Organizations: Yes  . Attends Archivist Meetings: Never  . Marital Status: Widowed    Tobacco Counseling Counseling given: Not Answered   Clinical Intake:  Pre-visit preparation completed: Yes  Pain : 0-10 Pain Score: 4  Pain Type:  Chronic pain Pain Location: Hip Pain Orientation: Right,Left Pain Descriptors / Indicators: Aching Pain Onset: More than a month ago Pain Frequency: Intermittent     BMI - recorded: 21.68 Nutritional Status: BMI of 19-24  Normal Nutritional Risks: None Diabetes: No  How often do you need to have someone help you when you read instructions, pamphlets, or other written materials from your doctor or pharmacy?: 1 - Never  Diabetic? No  Interpreter Needed?: No  Information entered by :: Hasset Chaviano, LPN   Activities of Daily Living In your present state of health, do you have any difficulty performing the following activities: 12/13/2020  Hearing? Y  Vision? N  Difficulty concentrating or making decisions? Y  Walking or climbing stairs? Y  Dressing or bathing? N  Doing errands, shopping? Y  Preparing Food and eating ? N  Using the Toilet? N  In the past six months, have you accidently leaked urine? Y  Do you have problems with loss of bowel control? Y  Comment since fall in 05/2020  Managing your Medications? N  Managing your Finances? N  Housekeeping or managing your Housekeeping? Y  Some recent data might be hidden    Patient Care Team: [name], DO as PCP - General (Family Medicine) Ralene Muskrat as Physician Assistant (Chiropractic Medicine) Sherlynn Stalls, MD as Consulting Physician (Ophthalmology) Shea Evans [name], LCSW as [employer] (Licensed Clinical Social Worker)  Indicate any recent [place] you may have received from other than [facility] providers in the past year (date may be approximate).     Assessment:   This is a routine wellness examination for Janice Reed.  Hearing/Vision screen  Hearing Screening   '125Hz'  '250Hz'  '500Hz'  '1000Hz'  '2000Hz'  '3000Hz'  '4000Hz'  '6000Hz'  '8000Hz'   Right ear:           Left ear:           Comments: C/o Moderate hearing loss - wears hearing aid - Got these from [facility] in [place]  Vision Screening  Comments: Macular degeneration - Frequent visits with Dr [facility] in [place]   Dietary issues and exercise activities discussed: Current Exercise Habits: Home exercise routine, Type of exercise: stretching;walking, Time (Minutes): 20, Frequency (  Times/Week): 7, Weekly Exercise (Minutes/Week): 140, Intensity: Mild, Exercise limited by: orthopedic condition(s)  Goals Addressed            This Visit's Progress   . Prevent falls   On track    Move carefully to avoid falls. Continue to use walker for balance.       Depression Screen PHQ 2/9 Scores 12/13/2020 09/14/2020 05/14/2020 02/16/2020 12/25/2019 12/11/2019 06/17/2019  PHQ - 2 Score 0 0 '3 1 2 ' 0 1  PHQ- 9 Score - - '5 3 5 ' - -    Fall Risk Fall Risk  12/13/2020 09/14/2020 07/27/2020 05/14/2020 02/16/2020  Falls in the past year? '1 1 1 1 1  ' Number falls in past yr: '1 1 1 1 1  ' Injury with Fall? '1 1 1 1 ' 0  Comment - - - - -  Risk Factor Category  - - - - -  Risk for fall due to : Impaired vision;Orthopedic patient;History of fall(s);Impaired balance/gait History of fall(s) History of fall(s) History of fall(s) History of fall(s);Impaired mobility  Follow up - Falls evaluation completed Falls evaluation completed - Falls prevention discussed    FALL RISK PREVENTION PERTAINING TO THE HOME:  Any stairs in or around the home? No  If so, are there any without handrails? No  Home free of loose throw rugs in walkways, pet beds, electrical cords, etc? Yes  Adequate lighting in your home to reduce risk of falls? Yes   ASSISTIVE DEVICES UTILIZED TO PREVENT FALLS:  Life alert? No  Use of a cane, walker or w/c? Yes  Grab bars in the bathroom? No  Shower chair or bench in shower? No  Elevated toilet seat or a handicapped toilet? Yes   TIMED UP AND GO:  Was the test performed? No . Telephonic visit.  Cognitive Function: MMSE - Mini Mental State Exam 12/03/2017 11/29/2016 10/08/2015 11/04/2014  Orientation to time '5 5 5 5  ' Orientation to Place '5 5 5 5   ' Registration '3 3 3 3  ' Attention/ Calculation '4 4 5 5  ' Recall '3 2 3 1  ' Language- name 2 objects '2 2 2 2  ' Language- repeat '1 1 1 1  ' Language- follow 3 step command '3 3 3 3  ' Language- read & follow direction '1 1 1 1  ' Write a sentence '1 1 1 1  ' Copy design '1 1 1 1  ' Total score '29 28 30 ' -     6CIT Screen 12/11/2019 12/10/2018  What Year? 0 points 0 points  What month? 0 points 0 points  What time? 0 points 0 points  Count back from 20 0 points 0 points  Months in reverse 0 points 0 points  Repeat phrase 0 points 0 points  Total Score 0 0    Immunizations Immunization History  Administered Date(s) Administered  . Fluad Quad(high Dose 65+) 05/09/2019, 05/14/2020  . Influenza Whole 04/07/2010  . Influenza, High Dose Seasonal PF 06/14/2016, 05/29/2017, 05/20/2018  . Influenza,inj,Quad PF,6+ Mos 06/04/2013, 05/22/2014, 05/31/2015  . Moderna Sars-Covid-2 Vaccination 09/12/2019, 10/11/2019, 06/15/2020  . Pneumococcal Conjugate-13 06/04/2013  . Pneumococcal Polysaccharide-23 08/07/1998  . Td 02/05/2007    TDAP status: Due, Education has been provided regarding the importance of this vaccine. Advised may receive this vaccine at local pharmacy or Health Dept. Aware to provide a copy of the vaccination record if obtained from local pharmacy or Health Dept. Verbalized acceptance and understanding.  Flu Vaccine status: Up to date  Pneumococcal vaccine status: Up to date  Covid-19 vaccine status: Completed vaccines  Qualifies for Shingles Vaccine? Yes   Zostavax completed No   Shingrix Completed?: No.    Education has been provided regarding the importance of this vaccine. Patient has been advised to call insurance company to determine out of pocket expense if they have not yet received this vaccine. Advised may also receive vaccine at local pharmacy or Health Dept. Verbalized acceptance and understanding.  Screening Tests Health Maintenance  Topic Date Due  . TETANUS/TDAP  02/04/2017  .  DEXA SCAN  11/10/2018  . COVID-19 Vaccine (4 - Booster for Moderna series) 12/13/2020  . INFLUENZA VACCINE  03/07/2021  . PNA vac Low Risk Adult  Completed  . HPV VACCINES  Aged Out    Health Maintenance  Health Maintenance Due  Topic Date Due  . TETANUS/TDAP  02/04/2017  . DEXA SCAN  11/10/2018  . COVID-19 Vaccine (4 - Booster for Moderna series) 12/13/2020    Colorectal cancer screening: No longer required.   Mammogram status: No longer required due to age.  Bone Density status: Completed 11/09/2016. Results reflect: Bone density results: OSTEOPOROSIS. Repeat every 2 years.  Lung Cancer Screening: (Low Dose CT Chest recommended if Age 81-80 years, 30 pack-year currently smoking OR have quit w/in 15years.) does not qualify.   Additional Screening:  Hepatitis C Screening: does not qualify  Vision Screening: Recommended annual ophthalmology exams for early detection of glaucoma and other disorders of the eye. Is the patient up to date with their annual eye exam?  Yes  Who is the provider or what is the name of the office in which the patient attends annual eye exams? [facility] If pt is not established with a provider, would they like to be referred to a provider to establish care? No .   Dental Screening: Recommended annual dental exams for proper oral hygiene  Community Resource Referral / Chronic Care Management: CRR required this visit?  No   CCM required this visit?  No      Plan:     I have personally reviewed and noted the following in the patient's chart:   . Medical and social history . Use of alcohol, tobacco or illicit drugs  . Current medications and supplements including opioid prescriptions.  . Functional ability and status . Nutritional status . Physical activity . Advanced directives . List of other physicians . Hospitalizations, surgeries, and ER visits in previous 12 months . Vitals . Screenings to include cognitive, depression, and  falls . Referrals and appointments  In addition, I have reviewed and discussed with patient certain preventive protocols, quality metrics, and best practice recommendations. A written personalized care plan for preventive services as well as general preventive health recommendations were provided to patient.     Sandrea Hammond, LPN   0/01/155   Nurse Notes: None

## 2020-12-14 ENCOUNTER — Ambulatory Visit: Payer: Medicare Other | Admitting: Family Medicine

## 2020-12-17 ENCOUNTER — Encounter: Payer: Self-pay | Admitting: Family Medicine

## 2020-12-23 ENCOUNTER — Other Ambulatory Visit: Payer: Self-pay | Admitting: Family Medicine

## 2020-12-31 ENCOUNTER — Ambulatory Visit (INDEPENDENT_AMBULATORY_CARE_PROVIDER_SITE_OTHER): Payer: Medicare Other | Admitting: Licensed Clinical Social Worker

## 2020-12-31 DIAGNOSIS — N183 Chronic kidney disease, stage 3 unspecified: Secondary | ICD-10-CM

## 2020-12-31 DIAGNOSIS — E039 Hypothyroidism, unspecified: Secondary | ICD-10-CM | POA: Diagnosis not present

## 2020-12-31 DIAGNOSIS — M810 Age-related osteoporosis without current pathological fracture: Secondary | ICD-10-CM

## 2020-12-31 DIAGNOSIS — E782 Mixed hyperlipidemia: Secondary | ICD-10-CM

## 2020-12-31 DIAGNOSIS — I129 Hypertensive chronic kidney disease with stage 1 through stage 4 chronic kidney disease, or unspecified chronic kidney disease: Secondary | ICD-10-CM

## 2020-12-31 NOTE — Patient Instructions (Signed)
Visit Information  PATIENT GOALS: Goals Addressed            This Visit's Progress   . Protect My Health;Complete ADLs daily, as able       Timeframe:  Short-Term Goal Priority:  Medium Progress: On Track Start Date:             12/31/20                Expected End Date:           04/02/21            Follow Up Date 02/15/21   Protect My Health (Patient) Complete ADLs daily, as able    Why is this important?    Screening tests can find diseases early when they are easier to treat.   Your doctor or nurse will talk with you about which tests are important for you.   Getting shots for common diseases like the flu and shingles will help prevent them.      Patient Coping Skills: Takes medications as prescribed Attends scheduled medical appointments  Patient Deficits: Mobility issues Memory challenges  Patient Goals:   Client to communicate with RNCM or LCSW in next 30 days for CCM support Client to communicate regularly with her daughter, [name], in next 30 days about client needs Client to attend scheduled medical appointments in next 30 days -  Follow Up Plan: LCSW to call client on 02/15/21       [name].Adil Tugwell MSW, LCSW Licensed Clinical Social Worker [facility] Care Management [phone]

## 2020-12-31 NOTE — Chronic Care Management (AMB) (Signed)
Chronic Care Management    Clinical Social Work Note  12/31/2020 Name: Janice Reed MRN: [id number] DOB: 1921-11-23  Janice Reed is a 87 y.o. year old female who is a primary care patient of [name], DO. The CCM team was consulted to assist the patient with chronic disease management and/or care coordination needs related to: [name] .   Engaged with patient/ daughter of patient, [name],  by telephone for follow up visit in response to provider referral for social work chronic care management and care coordination services.   Consent to Services:  The patient was given information about Chronic Care Management services, agreed to services, and gave verbal consent prior to initiation of services.  Please see initial visit note for detailed documentation.   Patient agreed to services and consent obtained.   Assessment: Review of patient past medical history, allergies, medications, and health status, including review of relevant consultants reports was performed today as part of a comprehensive evaluation and provision of chronic care management and care coordination services.     SDOH (Social Determinants of Health) assessments and interventions performed:  SDOH Interventions   Flowsheet Row Most Recent Value  SDOH Interventions   Depression Interventions/Treatment  --  [informed client and her daughter of LCSW support and of RNCM support]       Advanced Directives Status: See Vyn[employer] application for related entries.  CCM Care Plan  Allergies  Allergen Reactions  . Ciprofloxacin Anaphylaxis  . Benicar [Olmesartan Medoxomil] Other (See Comments)    Pt doesn't remember reaction  . Clarithromycin Other (See Comments)    Pt doesn't remember reaction  . Sulfa Antibiotics Other (See Comments)    Dr told patient not to take any more  . Alendronate Sodium Other (See Comments)    Upset stomach  . Celebrex [Celecoxib] Other (See Comments)    Dr said that it  was too strong for her  . Penicillins Rash    Outpatient Encounter Medications as of 12/31/2020  Medication Sig Note  . acetaminophen (TYLENOL) 650 MG CR tablet Take 650 mg by mouth every 8 (eight) hours as needed for pain. (Patient not taking: Reported on 12/13/2020)   . aspirin 325 MG tablet Take 325 mg by mouth See admin instructions. Take one tablet a day and more if needed for pain - up to 3 times daily   . bevacizumab (AVASTIN) 1.25 mg/0.1 mL SOLN 1.25 mg by Intravitreal route.  06/17/2019: Dr Baird Cancer ( eye specialist )   . Calcium-Magnesium-Vitamin D (CITRACAL CALCIUM+D PO) Take 2 capsules by mouth 2 (two) times daily. 12/13/2020: Takes one BID  . Cholecalciferol (VITAMIN D3) 1000 UNITS CAPS Take 1,000 Units by mouth 2 (two) times daily.  12/13/2020: Taking one daily  . diclofenac Sodium (VOLTAREN) 1 % GEL Apply 2 g topically 4 (four) times daily. (for arthritis). if not covered, please call (Patient not taking: Reported on 12/13/2020)   . levothyroxine (SYNTHROID) 100 MCG tablet TAKE 1 TABLET BY MOUTH  DAILY   . mometasone (ELOCON) 0.1 % cream Apply thin layer to dry, itchy patches on back ONCE daily x7-10days. (Patient not taking: Reported on 12/13/2020)   . Multiple Vitamins-Minerals (PRESERVISION AREDS PO) Take by mouth.   . NONFORMULARY OR COMPOUNDED ITEM Mix triamcinolone cream 0.1% in ceravae cream (1:3) # 464 grams (1 large jar): Apply to affected areas of legs daily. (Patient not taking: Reported on 12/13/2020)   . Omega-3 Fatty Acids (FISH OIL) 1000 MG CAPS Take  1,000 mg by mouth 2 (two) times daily.   . risedronate (ACTONEL) 35 MG tablet TAKE 1 TABLET BY MOUTH  WEEKLY WITH 8 OUNCE OF  PLAIN WATER 1/2 HOUR BEFORE FIRST FOOD DRINK OR MEDS.  STAY UPRIGHT FOR 1/2 HOUR (Patient not taking: Reported on 12/13/2020)   . triamterene-hydrochlorothiazide (MAXZIDE-25) 37.5-25 MG tablet Take 0.5 tablets by mouth daily. (NEEDS TO BE SEEN BEFORE NEXT REFILL)    No facility-administered encounter medications  on file as of 12/31/2020.    Patient Active Problem List   Diagnosis Date Noted  . Physical deconditioning 06/17/2019  . Mixed stress and urge urinary incontinence 06/17/2019  . Impaired mobility 05/05/2019  . Benign hypertension with chronic kidney disease, stage III ([place]) 05/22/2014  . Age-related macular degeneration, wet, both eyes ([place]) 03/12/2014  . Internal hemorrhoids without mention of complication 01/74/9449  . Immune fecal occult blood test + x 2 01/05/2012  . Cancer of right breast ([place]) 09/28/2011  . Hearing loss 12/27/2010  . Arthritis 12/27/2010  . Hyperlipidemia   . Phlebitis and thrombophlebitis of unspecified site   . Symptomatic menopausal or female climacteric states   . Malaise and fatigue   . Palpitations   . Nonspecific abnormal electrocardiogram (ECG) (EKG)   . Hypothyroidism   . Osteoporosis   . IBS (irritable bowel syndrome)     Conditions to be addressed/monitored: Monitor client completion of ADLs, as able  Care Plan : LCSW Care Plan  Updates made by Katha Cabal, LCSW since 12/31/2020 12:00 AM    Problem: Coping Skills (General Plan of Care)     Goal: Coping Skills Enhanced;Complete ADLs daily as able   Start Date: 12/31/2020  Expected End Date: 04/02/2021  This Visit's Progress: On track  Priority: Medium  Note:   Current barriers:   . Patient in need of assistance with connecting to community resources for possible help with client completion of ADLs and daily activities . Patient is unable to independently navigate community resource options without care coordination support . Mobility challenges  Clinical Goals:  patient will work with SW monthly to  address concerns related to ADLs completion of client, as able Patient will work with SW monthly to address concerns related to mobility of client  Clinical Interventions:  . Collaboration with [name], DO regarding development and update of comprehensive plan of care as  evidenced by provider attestation and co-signature . Assessment of needs, barriers  of client . Talked with [name], daughter of client, about client needs . Talked with [name] about sleeping issues of client . Talked with [name] about appetite of client . Talked with [name] about ambulation of client (uses a walker to help her ambulate) . Talked with [name] about client difficulty in standing . Talked with [name] about pain issues of client . Talked with [name] about upcoming client medical appointments . Talked with [name] about medication procurement for client . Talked with [name] about relaxation techniques of client (enjoys visiting with family or talking via phone with family , watches TV) . Talked with [name]  about vision of client (client has macular degeneration) . Talked with [name] about [facility] aide who helps client weekly as scheduled . Encouraged client or [name] to call RNCM as needed for nursing support  Patient Coping Skills: Takes medications as prescribed Attends scheduled medical appointments  Patient Deficits: Mobility issues Memory challenges  Patient Goals:   Client to communicate with RNCM or LCSW in next 30 days for  CCM support Client to communicate regularly with her daughter, [name], in next 30 days about client needs Client to attend scheduled medical appointments in next 30 days -  Follow Up Plan: LCSW to call client on 02/15/21     [name].Keenon Leitzel MSW, LCSW Licensed Clinical Social Worker [facility] Care Management [phone]

## 2021-02-01 ENCOUNTER — Encounter: Payer: Self-pay | Admitting: Family Medicine

## 2021-02-01 ENCOUNTER — Ambulatory Visit (INDEPENDENT_AMBULATORY_CARE_PROVIDER_SITE_OTHER): Payer: Medicare Other | Admitting: Family Medicine

## 2021-02-01 ENCOUNTER — Other Ambulatory Visit: Payer: Self-pay

## 2021-02-01 VITALS — BP 150/81 | HR 74 | Temp 97.6°F | Ht 60.0 in | Wt 108.4 lb

## 2021-02-01 DIAGNOSIS — M25551 Pain in right hip: Secondary | ICD-10-CM | POA: Diagnosis not present

## 2021-02-01 DIAGNOSIS — G8929 Other chronic pain: Secondary | ICD-10-CM

## 2021-02-01 DIAGNOSIS — Z7409 Other reduced mobility: Secondary | ICD-10-CM

## 2021-02-01 DIAGNOSIS — R7989 Other specified abnormal findings of blood chemistry: Secondary | ICD-10-CM

## 2021-02-01 DIAGNOSIS — I129 Hypertensive chronic kidney disease with stage 1 through stage 4 chronic kidney disease, or unspecified chronic kidney disease: Secondary | ICD-10-CM | POA: Diagnosis not present

## 2021-02-01 DIAGNOSIS — N183 Chronic kidney disease, stage 3 unspecified: Secondary | ICD-10-CM

## 2021-02-01 MED ORDER — TRIAMTERENE-HCTZ 37.5-25 MG PO TABS: 0.5000 | ORAL_TABLET | Freq: Every day | ORAL | 3 refills | Status: DC

## 2021-02-01 NOTE — Patient Instructions (Signed)
You had labs performed today.  You will be contacted with the results of the labs once they are available, usually in the next 3 business days for routine lab work.  If you have an active my chart account, they will be released to your MyChart.  If you prefer to have these labs released to you via telephone, please let us know.  We talked about new walker.  If you decide you want to see the joint specialist for injections let me know In the meantime, use Tylenol arthritis up to 3 times daily for joint pain.  Go back on your blood pressure medication (triamterene/ HCTZ)

## 2021-02-01 NOTE — Progress Notes (Signed)
Subjective: CC: Follow-up hypertension, hip PCP: [name], DO SMO:LMBE CAOIMHE DAMRON is a 87 y.o. female presenting to clinic today for:  1.  Right hip pain Patient was seen about 4 months ago for right-sided hip pain.  She notes that symptoms do seem to be somewhat better after physical therapy and she has felt stronger but still has reluctance to ambulate over certain surfaces.  She has a gravel driveway and does not feel confident that she can ambulate throughout the driveway without assistance.  Her rolling walker does not really function on that surface.  She is had no falls.  2.  Hypertension Patient notes that she is been off of her blood pressure medication.  No chest pain, shortness of breath, blurred vision.   ROS: Per HPI  Allergies  Allergen Reactions   Ciprofloxacin Anaphylaxis   Benicar [Olmesartan Medoxomil] Other (See Comments)    Pt doesn't remember reaction   Clarithromycin Other (See Comments)    Pt doesn't remember reaction   Sulfa Antibiotics Other (See Comments)    Dr told patient not to take any more   Alendronate Sodium Other (See Comments)    Upset stomach   Celebrex [Celecoxib] Other (See Comments)    Dr said that it was too strong for her   Penicillins Rash   Past Medical History:  Diagnosis Date   Arthritis    Arthritis pain    Benign hypertensive heart disease    Breast cancer ([place])    Cancer of right breast ([place]) 09/28/2011   IDC;  T2, N0 (IHC+);   ER+;  Her-2 neg.,  Right PM,SLN  09/08/08    Collagenous colitis    Diverticulosis of colon    External hemorrhoid    Hearing loss    Hypertension    IBS (irritable bowel syndrome)    Macular degeneration    Macular degeneration of both eyes    Neuropathy, peripheral    Nonspecific abnormal electrocardiogram (ECG) (EKG)    Osteoporosis    Other and unspecified hyperlipidemia    Palpitations    Phlebitis and thrombophlebitis of unspecified site    Prolapse of vaginal walls without  mention of uterine prolapse    Shingles    Symptomatic menopausal or female climacteric states    Unspecified hypothyroidism     Current Outpatient Medications:    acetaminophen (TYLENOL) 650 MG CR tablet, Take 650 mg by mouth every 8 (eight) hours as needed for pain. (Patient not taking: Reported on 12/13/2020), Disp: , Rfl:    aspirin 325 MG tablet, Take 325 mg by mouth See admin instructions. Take one tablet a day and more if needed for pain - up to 3 times daily, Disp: , Rfl:    bevacizumab (AVASTIN) 1.25 mg/0.1 mL SOLN, 1.25 mg by Intravitreal route. , Disp: , Rfl:    Calcium-Magnesium-Vitamin D (CITRACAL CALCIUM+D PO), Take 2 capsules by mouth 2 (two) times daily., Disp: , Rfl:    Cholecalciferol (VITAMIN D3) 1000 UNITS CAPS, Take 1,000 Units by mouth 2 (two) times daily. , Disp: , Rfl:    diclofenac Sodium (VOLTAREN) 1 % GEL, Apply 2 g topically 4 (four) times daily. (for arthritis). if not covered, please call (Patient not taking: Reported on 12/13/2020), Disp: 400 g, Rfl: 0   levothyroxine (SYNTHROID) 100 MCG tablet, TAKE 1 TABLET BY MOUTH  DAILY, Disp: 90 tablet, Rfl: 3   mometasone (ELOCON) 0.1 % cream, Apply thin layer to dry, itchy patches on back ONCE daily  x7-10days. (Patient not taking: Reported on 12/13/2020), Disp: 45 g, Rfl: 0   Multiple Vitamins-Minerals (PRESERVISION AREDS PO), Take by mouth., Disp: , Rfl:    NONFORMULARY OR COMPOUNDED ITEM, Mix triamcinolone cream 0.1% in ceravae cream (1:3) # 464 grams (1 large jar): Apply to affected areas of legs daily. (Patient not taking: Reported on 12/13/2020), Disp: 464 each, Rfl: 1   Omega-3 Fatty Acids (FISH OIL) 1000 MG CAPS, Take 1,000 mg by mouth 2 (two) times daily., Disp: , Rfl:    risedronate (ACTONEL) 35 MG tablet, TAKE 1 TABLET BY MOUTH  WEEKLY WITH 8 OUNCE OF  PLAIN WATER 1/2 HOUR BEFORE FIRST FOOD DRINK OR MEDS.  STAY UPRIGHT FOR 1/2 HOUR (Patient not taking: Reported on 12/13/2020), Disp: 12 tablet, Rfl: 0    triamterene-hydrochlorothiazide (MAXZIDE-25) 37.5-25 MG tablet, Take 0.5 tablets by mouth daily. (NEEDS TO BE SEEN BEFORE NEXT REFILL), Disp: 45 tablet, Rfl: 0 Social History   Socioeconomic History   Marital status: Widowed    Spouse name: Not on file   Number of children: 2   Years of education: 63   Highest education level: 12th grade  Occupational History   Occupation: retired-homemaker  Tobacco Use   Smoking status: Never   Smokeless tobacco: Never  Vaping Use   Vaping Use: Never used  Substance and Sexual Activity   Alcohol use: No   Drug use: No   Sexual activity: Not Currently  Other Topics Concern   Not on file  Social History Narrative   Widowed, 1 son one daughter, retired and homemaker. No alcohol or caffeine or tobacco.   She lives alone; Her daughter lives next door   Daughter, [name] is [employer] POA   She has a Actuary, Mariann Laster 5 hours a day, 5 days per week.   Social Determinants of Health   Financial Resource Strain: Low Risk    Difficulty of Paying Living Expenses: Not hard at all  Food Insecurity: No Food Insecurity   Worried About Charity fundraiser in the Last Year: Never true   [place] in the Last Year: Never true  Transportation Needs: No Transportation Needs   Lack of Transportation (Medical): No   Lack of Transportation (Non-Medical): No  Physical Activity: Insufficiently Active   Days of Exercise per Week: 7 days   Minutes of Exercise per Session: 20 min  Stress: No Stress Concern Present   Feeling of Stress : Only a little  Social Connections: Moderately Integrated   Frequency of Communication with Friends and Family: More than three times a week   Frequency of Social Gatherings with Friends and Family: More than three times a week   Attends Religious Services: More than 4 times per year   Active Member of [employer] or Organizations: Yes   Attends Archivist Meetings: Never   Marital Status: Widowed  Intimate Partner Violence:  Not At Risk   Fear of Current or Ex-Partner: No   Emotionally Abused: No   Physically Abused: No   Sexually Abused: No   Family History  Problem Relation Age of Onset   Heart disease Mother        Heart failure per medical history form dated 09/27/09.   Heart disease Father        Heart attack per medical history form dated 09/27/09.   Heart attack Father    Heart disease Brother        Heart failure per medical history form dated 09/27/09.  Stroke Brother    Stroke Brother    Leukemia Brother    Breast cancer Cousin    Healthy Daughter    Healthy Son    Colon cancer Neg Hx     Objective: Office vital signs reviewed. BP (!) 150/81   Pulse 74   Temp 97.6 F (36.4 C)   Ht 5' (1.524 m)   Wt 108 lb 6.4 oz (49.2 kg)   SpO2 94%   BMI 21.17 kg/m   Physical Examination:  General: Awake, alert, thin, elderly female, No acute distress HEENT: Normal; no exophthalmos or goiter Cardio: regular rate and rhythm, S1S2 heard, no murmurs appreciated Pulm: clear to auscultation bilaterally, no wheezes, rhonchi or rales; normal work of breathing on room air MSK: Slow gait.  Requires rolling walker for ambulation. Neuro: Hard of hearing but very pleasant and interactive  Assessment/ Plan: 87 y.o. female   Chronic right hip pain  Impaired mobility  Elevated TSH - Plan: TSH, T4, Free  Benign hypertension with chronic kidney disease, stage III ([place]) - Plan: Renal Function Panel, triamterene-hydrochlorothiazide (MAXZIDE-25) 37.5-25 MG tablet  Hip pain improving.  Offered prescription for new rolling walker.  She declined today.  TSH noted to be elevated on last lab draw so we will recheck this today  Patient with known renal disease.  Would avoid NSAIDs for hip pain but can use Tylenol up to 3 times daily if needed.  This was discussed during her visit.  I have reordered her Maxide as her blood pressure was not at goal today and I would worry about her risking CVA versus MI versus  progressive renal disease.  Check renal function panel  No orders of the defined types were placed in this encounter.  No orders of the defined types were placed in this encounter.    [name], DO [place] [phone]

## 2021-02-02 ENCOUNTER — Other Ambulatory Visit: Payer: Self-pay | Admitting: Family Medicine

## 2021-02-02 DIAGNOSIS — E039 Hypothyroidism, unspecified: Secondary | ICD-10-CM

## 2021-02-02 LAB — RENAL FUNCTION PANEL
Albumin: 4.4 g/dL (ref 3.5–4.6)
BUN/Creatinine Ratio: 19 (ref 12–28)
BUN: 19 mg/dL (ref 10–36)
CO2: 27 mmol/L (ref 20–29)
Calcium: 9.8 mg/dL (ref 8.7–10.3)
Chloride: 98 mmol/L (ref 96–106)
Creatinine, Ser: 1 mg/dL (ref 0.57–1.00)
Glucose: 90 mg/dL (ref 65–99)
Phosphorus: 3.2 mg/dL (ref 3.0–4.3)
Potassium: 4.9 mmol/L (ref 3.5–5.2)
Sodium: 140 mmol/L (ref 134–144)
eGFR: 51 mL/min/{1.73_m2} — ABNORMAL LOW (ref >59)

## 2021-02-02 LAB — TSH: TSH: 17.6 u[IU]/mL — ABNORMAL HIGH (ref 0.450–4.500)

## 2021-02-02 LAB — T4, FREE: Free T4: 1.13 ng/dL (ref 0.82–1.77)

## 2021-02-02 MED ORDER — LEVOTHYROXINE SODIUM 112 MCG PO TABS: 112.0000 ug | ORAL_TABLET | Freq: Every day | ORAL | 0 refills | Status: DC

## 2021-02-15 ENCOUNTER — Ambulatory Visit (INDEPENDENT_AMBULATORY_CARE_PROVIDER_SITE_OTHER): Payer: Medicare Other | Admitting: Licensed Clinical Social Worker

## 2021-02-15 DIAGNOSIS — E782 Mixed hyperlipidemia: Secondary | ICD-10-CM

## 2021-02-15 DIAGNOSIS — M810 Age-related osteoporosis without current pathological fracture: Secondary | ICD-10-CM

## 2021-02-15 DIAGNOSIS — N183 Chronic kidney disease, stage 3 unspecified: Secondary | ICD-10-CM | POA: Diagnosis not present

## 2021-02-15 DIAGNOSIS — E039 Hypothyroidism, unspecified: Secondary | ICD-10-CM

## 2021-02-15 DIAGNOSIS — I129 Hypertensive chronic kidney disease with stage 1 through stage 4 chronic kidney disease, or unspecified chronic kidney disease: Secondary | ICD-10-CM

## 2021-02-15 NOTE — Patient Instructions (Addendum)
Visit Information  PATIENT GOALS:  Goals Addressed             This Visit's Progress    Protect My Health;Complete ADLs daily, as able       Timeframe:  Short-Term Goal Priority:  Medium Progress: On Track Start Date:             02/15/21                Expected End Date:           05/05/21            Follow Up Date 03/25/21   Protect My Health (Patient) Complete ADLs daily, as able    Why is this important?   Screening tests can find diseases early when they are easier to treat.  Your doctor or nurse will talk with you about which tests are important for you.  Getting shots for common diseases like the flu and shingles will help prevent them.    Patient Coping Skills: Takes medications as prescribed Attends scheduled medical appointments  Patient Deficits: Mobility issues Memory challenges  Patient Goals:   Client to communicate with RNCM or LCSW in next 30 days for CCM support Client to communicate regularly with her daughter, [name], in next 30 days about client needs Client to attend scheduled medical appointments in next 30 days -  Follow Up Plan: LCSW to call client on 03/25/21     [name].[place] Espina MSW, LCSW Licensed Clinical Social Worker [facility] Care Management [phone]

## 2021-02-15 NOTE — Chronic Care Management (AMB) (Signed)
Chronic Care Management    Clinical Social Work Note  02/15/2021 Name: Janice Reed MRN: [id number] DOB: Dec 18, 1921  Janice Reed is a 87 y.o. year old female who is a primary care patient of Janice Norlander, DO. The CCM team was consulted to assist the patient with chronic disease management and/or care coordination needs related to: [name] .   Engaged with patient / daughter of patient, Janice Reed, by telephone for follow up visit in response to provider referral for social work chronic care management and care coordination services.   Consent to Services:  The patient was given information about Chronic Care Management services, agreed to services, and gave verbal consent prior to initiation of services.  Please see initial visit note for detailed documentation.   Patient agreed to services and consent obtained.   Assessment: Review of patient past medical history, allergies, medications, and health status, including review of relevant consultants reports was performed today as part of a comprehensive evaluation and provision of chronic care management and care coordination services.     SDOH (Social Determinants of Health) assessments and interventions performed:  SDOH Interventions    Flowsheet Row Most Recent Value  SDOH Interventions   Depression Interventions/Treatment  --  [informed client/daughter of LCSW support and of RNCM support]        Advanced Directives Status: See Vynca application for related entries.  CCM Care Plan  Allergies  Allergen Reactions   Ciprofloxacin Anaphylaxis   Benicar [Olmesartan Medoxomil] Other (See Comments)    Pt doesn't remember reaction   Clarithromycin Other (See Comments)    Pt doesn't remember reaction   Sulfa Antibiotics Other (See Comments)    Dr told patient not to take any more   Alendronate Sodium Other (See Comments)    Upset stomach   Celebrex [Celecoxib] Other (See Comments)    Dr said that it was too  strong for her   Penicillins Rash    Outpatient Encounter Medications as of 02/15/2021  Medication Sig Note   acetaminophen (TYLENOL) 650 MG CR tablet Take 650 mg by mouth every 8 (eight) hours as needed for pain.    aspirin 325 MG tablet Take 325 mg by mouth See admin instructions. Take one tablet a day and more if needed for pain - up to 3 times daily    bevacizumab (AVASTIN) 1.25 mg/0.1 mL SOLN 1.25 mg by Intravitreal route.  06/17/2019: Dr Baird Cancer ( eye specialist )    Calcium-Magnesium-Vitamin D (CITRACAL CALCIUM+D PO) Take 2 capsules by mouth 2 (two) times daily. 12/13/2020: Takes one BID   Cholecalciferol (VITAMIN D3) 1000 UNITS CAPS Take 1,000 Units by mouth 2 (two) times daily.  12/13/2020: Taking one daily   diclofenac Sodium (VOLTAREN) 1 % GEL Apply 2 g topically 4 (four) times daily. (for arthritis). if not covered, please call    levothyroxine (SYNTHROID) 112 MCG tablet Take 1 tablet (112 mcg total) by mouth daily.    mometasone (ELOCON) 0.1 % cream Apply thin layer to dry, itchy patches on back ONCE daily x7-10days.    Multiple Vitamins-Minerals (PRESERVISION AREDS PO) Take by mouth.    NONFORMULARY OR COMPOUNDED ITEM Mix triamcinolone cream 0.1% in ceravae cream (1:3) # 464 grams (1 large jar): Apply to affected areas of legs daily.    Omega-3 Fatty Acids (FISH OIL) 1000 MG CAPS Take 1,000 mg by mouth 2 (two) times daily.    risedronate (ACTONEL) 35 MG tablet TAKE 1 TABLET BY MOUTH  WEEKLY  WITH 8 OUNCE OF  PLAIN WATER 1/2 HOUR BEFORE FIRST FOOD DRINK OR MEDS.  STAY UPRIGHT FOR 1/2 HOUR (Patient taking differently: TAKE 1 TABLET BY MOUTH  WEEKLY WITH 8 OUNCE OF  PLAIN WATER 1/2 HOUR BEFORE FIRST FOOD DRINK OR MEDS.  STAY UPRIGHT FOR 1/2 HOUR)    triamterene-hydrochlorothiazide (MAXZIDE-25) 37.5-25 MG tablet Take 0.5 tablets by mouth daily.    No facility-administered encounter medications on file as of 02/15/2021.    Patient Active Problem List   Diagnosis Date Noted   Physical  deconditioning 06/17/2019   Mixed stress and urge urinary incontinence 06/17/2019   Impaired mobility 05/05/2019   Benign hypertension with chronic kidney disease, stage III ([place]) 05/22/2014   Age-related macular degeneration, wet, both eyes ([place]) 03/12/2014   Internal hemorrhoids without mention of complication 42/70/6237   Immune fecal occult blood test + x 2 01/05/2012   Cancer of right breast ([place]) 09/28/2011   Hearing loss 12/27/2010   Arthritis 12/27/2010   Hyperlipidemia    Phlebitis and thrombophlebitis of unspecified site    Symptomatic menopausal or female climacteric states    Malaise and fatigue    Palpitations    Nonspecific abnormal electrocardiogram (ECG) (EKG)    Hypothyroidism    Osteoporosis    IBS (irritable bowel syndrome)     Conditions to be addressed/monitored: Monitor client completion of ADLs daily, as she is able   Care Plan : [place]  Updates made by Katha Cabal, LCSW since 02/15/2021 12:00 AM     Problem: Coping Skills (General Plan of Care)      Goal: Coping Skills Enhanced;Complete ADLs daily as able   Start Date: 02/15/2021  Expected End Date: 05/05/2021  This Visit's Progress: On track  Recent Progress: On track  Priority: Medium  Note:   Current barriers:   Patient in need of assistance with connecting to community resources for possible help with client completion of ADLs and daily activities Patient is unable to independently navigate community resource options without care coordination support Mobility challenges  Clinical Goals:  patient will work with SW monthly to  address concerns related to ADLs completion of client, as able Patient will work with SW monthly to address concerns related to mobility of client  Clinical Interventions:  Collaboration with Janice Norlander, DO regarding development and update of comprehensive plan of care as evidenced by provider attestation and co-signature Assessment of needs,  barriers  of client Talked with Janice Reed, daughter of client, about client needs Talked with Janice Reed about sleeping issues of client Talked with Janice Reed about appetite of client Talked with Janice Reed about ambulation of client (uses a walker to help her ambulate) Talked with Janice Reed about client difficulty in standing (client uses a Lift chair to help her get up and down) Talked with Janice Reed about pain issues of client Talked with Janice Reed about upcoming client medical appointments Talked with Janice Reed about medication procurement for client Talked with Janice Reed about relaxation techniques of client (enjoys visiting with family or talking via phone with family , watches TV) Talked with Janice Reed  about vision of client (client has macular degeneration) Talked with Janice Reed about [facility] home health aide who helps client weekly as scheduled Encouraged client or Janice Reed to call [facility] as needed for nursing support Talked with Janice Reed about CCM program support for client Talked with Janice Reed about energy level of client  Patient Coping Skills: Takes medications as prescribed Attends scheduled medical appointments  Patient Deficits: Mobility issues Memory  challenges  Patient Goals:   Client to communicate with RNCM or LCSW in next 30 days for CCM support Client to communicate regularly with her daughter, Janice Reed, in next 30 days about client needs Client to attend scheduled medical appointments in next 30 days -  Follow Up Plan: LCSW to call client on 03/25/21     [name].Fortune Brannigan MSW, LCSW Licensed Clinical Social Worker [facility] Care Management [phone]

## 2021-02-21 ENCOUNTER — Telehealth: Payer: Self-pay | Admitting: Family Medicine

## 2021-02-21 NOTE — Telephone Encounter (Signed)
It appears she has been on this bisphophonate for well over 5 years.  Recommend drug holiday, risk of atypical fracture increases with prolonged use.  Could switch to prolia if she desires but I would favor simply taking a drug holiday for 6 months then we can talk about restarting.  Continue adequate dietary calcium and vitamin d and weight bearing exercise as tolerated.

## 2021-03-15 DIAGNOSIS — H43813 Vitreous degeneration, bilateral: Secondary | ICD-10-CM | POA: Diagnosis not present

## 2021-03-15 DIAGNOSIS — H353221 Exudative age-related macular degeneration, left eye, with active choroidal neovascularization: Secondary | ICD-10-CM | POA: Diagnosis not present

## 2021-03-15 DIAGNOSIS — H353212 Exudative age-related macular degeneration, right eye, with inactive choroidal neovascularization: Secondary | ICD-10-CM | POA: Diagnosis not present

## 2021-03-15 DIAGNOSIS — Z7689 Persons encountering health services in other specified circumstances: Secondary | ICD-10-CM | POA: Diagnosis not present

## 2021-03-25 ENCOUNTER — Ambulatory Visit (INDEPENDENT_AMBULATORY_CARE_PROVIDER_SITE_OTHER): Payer: Medicare Other | Admitting: Licensed Clinical Social Worker

## 2021-03-25 DIAGNOSIS — N183 Chronic kidney disease, stage 3 unspecified: Secondary | ICD-10-CM

## 2021-03-25 DIAGNOSIS — E039 Hypothyroidism, unspecified: Secondary | ICD-10-CM

## 2021-03-25 DIAGNOSIS — E782 Mixed hyperlipidemia: Secondary | ICD-10-CM | POA: Diagnosis not present

## 2021-03-25 DIAGNOSIS — M810 Age-related osteoporosis without current pathological fracture: Secondary | ICD-10-CM

## 2021-03-25 DIAGNOSIS — I129 Hypertensive chronic kidney disease with stage 1 through stage 4 chronic kidney disease, or unspecified chronic kidney disease: Secondary | ICD-10-CM | POA: Diagnosis not present

## 2021-03-25 NOTE — Chronic Care Management (AMB) (Signed)
Chronic Care Management    Clinical Social Work Note  03/25/2021 Name: Janice Reed MRN: [id number] DOB: 1922-01-13  Janice Reed is a 87 y.o. year old female who is a primary care patient of Janice Norlander, DO. The CCM team was consulted to assist the patient with chronic disease management and/or care coordination needs related to: [name] .   Engaged with patient / daughter of patient, Janice Reed, by telephone for follow up visit in response to provider referral for social work chronic care management and care coordination services.   Consent to Services:  The patient was given information about Chronic Care Management services, agreed to services, and gave verbal consent prior to initiation of services.  Please see initial visit note for detailed documentation.   Patient agreed to services and consent obtained.   Assessment: Review of patient past medical history, allergies, medications, and health status, including review of relevant consultants reports was performed today as part of a comprehensive evaluation and provision of chronic care management and care coordination services.     SDOH (Social Determinants of Health) assessments and interventions performed:  SDOH Interventions    Flowsheet Row Most Recent Value  SDOH Interventions   Physical Activity Interventions Other (Comments)  [uses walker to walk,  she has decreased energy]  Stress Interventions Provide Counseling  [client has confusion,  she receives care daily from home health aide or from family members]  Depression Interventions/Treatment  --  [informed Janice Reed, daughter of client, of LCSW support for client and of RNCM support for client]        Advanced Directives Status: See Vynca application for related entries.  CCM Care Plan  Allergies  Allergen Reactions   Ciprofloxacin Anaphylaxis   Benicar [Olmesartan Medoxomil] Other (See Comments)    Pt doesn't remember reaction    Clarithromycin Other (See Comments)    Pt doesn't remember reaction   Sulfa Antibiotics Other (See Comments)    Dr told patient not to take any more   Alendronate Sodium Other (See Comments)    Upset stomach   Celebrex [Celecoxib] Other (See Comments)    Dr said that it was too strong for her   Penicillins Rash    Outpatient Encounter Medications as of 03/25/2021  Medication Sig Note   acetaminophen (TYLENOL) 650 MG CR tablet Take 650 mg by mouth every 8 (eight) hours as needed for pain.    aspirin 325 MG tablet Take 325 mg by mouth See admin instructions. Take one tablet a day and more if needed for pain - up to 3 times daily    bevacizumab (AVASTIN) 1.25 mg/0.1 mL SOLN 1.25 mg by Intravitreal route.  06/17/2019: Dr Baird Cancer ( eye specialist )    Calcium-Magnesium-Vitamin D (CITRACAL CALCIUM+D PO) Take 2 capsules by mouth 2 (two) times daily. 12/13/2020: Takes one BID   Cholecalciferol (VITAMIN D3) 1000 UNITS CAPS Take 1,000 Units by mouth 2 (two) times daily.  12/13/2020: Taking one daily   diclofenac Sodium (VOLTAREN) 1 % GEL Apply 2 g topically 4 (four) times daily. (for arthritis). if not covered, please call    levothyroxine (SYNTHROID) 112 MCG tablet Take 1 tablet (112 mcg total) by mouth daily.    mometasone (ELOCON) 0.1 % cream Apply thin layer to dry, itchy patches on back ONCE daily x7-10days.    Multiple Vitamins-Minerals (PRESERVISION AREDS PO) Take by mouth.    NONFORMULARY OR COMPOUNDED ITEM Mix triamcinolone cream 0.1% in ceravae cream (1:3) # [id number]  grams (1 large jar): Apply to affected areas of legs daily.    Omega-3 Fatty Acids (FISH OIL) 1000 MG CAPS Take 1,000 mg by mouth 2 (two) times daily.    risedronate (ACTONEL) 35 MG tablet TAKE 1 TABLET BY MOUTH  WEEKLY WITH 8 OUNCE OF  PLAIN WATER 1/2 HOUR BEFORE FIRST FOOD DRINK OR MEDS.  STAY UPRIGHT FOR 1/2 HOUR (Patient taking differently: TAKE 1 TABLET BY MOUTH  WEEKLY WITH 8 OUNCE OF  PLAIN WATER 1/2 HOUR BEFORE FIRST FOOD DRINK OR  MEDS.  STAY UPRIGHT FOR 1/2 HOUR)    triamterene-hydrochlorothiazide (MAXZIDE-25) 37.5-25 MG tablet Take 0.5 tablets by mouth daily.    No facility-administered encounter medications on file as of 03/25/2021.    Patient Active Problem List   Diagnosis Date Noted   Physical deconditioning 06/17/2019   Mixed stress and urge urinary incontinence 06/17/2019   Impaired mobility 05/05/2019   Benign hypertension with chronic kidney disease, stage III ([place]) 05/22/2014   Age-related macular degeneration, wet, both eyes ([place]) 03/12/2014   Internal hemorrhoids without mention of complication 123456   Immune fecal occult blood test + x 2 01/05/2012   Cancer of right breast ([place]) 09/28/2011   Hearing loss 12/27/2010   Arthritis 12/27/2010   Hyperlipidemia    Phlebitis and thrombophlebitis of unspecified site    Symptomatic menopausal or female climacteric states    Malaise and fatigue    Palpitations    Nonspecific abnormal electrocardiogram (ECG) (EKG)    Hypothyroidism    Osteoporosis    IBS (irritable bowel syndrome)     Conditions to be addressed/monitored: monitor client completion of ADLs, as she is able  Care Plan : [place]  Updates made by Katha Cabal, LCSW since 03/25/2021 12:00 AM     Problem: Coping Skills (General Plan of Care)      Goal: Coping Skills Enhanced;Complete ADLs daily as able   Start Date: 03/25/2021  Expected End Date: 06/23/2021  This Visit's Progress: On track  Recent Progress: On track  Priority: Medium  Note:   Current barriers:   Patient in need of assistance with connecting to community resources for possible help with client completion of ADLs and daily activities Patient is unable to independently navigate community resource options without care coordination support Mobility challenges  Clinical Goals:  patient will work with SW monthly to  address concerns related to ADLs completion of client, as able Patient will work with SW  monthly to address concerns related to mobility of client  Clinical Interventions:  Collaboration with Janice Norlander, DO regarding development and update of comprehensive plan of care as evidenced by provider attestation and co-signature Talked with Janice Reed, daughter of client, about client needs Talked with [name] about sleeping issues of client Talked with [name] about appetite of client Talked with [name] about ambulation of client (uses a walker to help her ambulate) Talked with [name] about client difficulty in standing (client uses a Lift chair to help her get up and down) Talked with [name] about pain issues of client Talked with [name] about upcoming client medical appointments Talked with [name] about medication procurement for client Talked with [name] about relaxation techniques of client (enjoys visiting with family or talking via phone with family , watches TV) Talked with [name]  about vision of client (client has macular degeneration) Talked with [name] about [facility] home [facility] aide who helps client weekly as scheduled Encouraged client or [name] to call [facility] as needed for  nursing support Talked with [name] about CCM program support for client Talked with [name] about energy level of client. [name] said client had decreased energy  Talked with [name] about transport assist for client. [name] said that often the [facility] aide will transport client to and from client medical appointments Talked with [name] about in home support services for client (discussed ADTS services) Talked with [name] about client Macular Degeneration.   Talked with [name] about client difficulty in standing for long periods of time. [name] said client can stand with her walker for short periods of time only; she said client fatigues when standing for very long  Patient Coping Skills: Takes medications as prescribed Attends scheduled medical appointments  Patient Deficits: Mobility issues Memory challenges  Patient Goals:   Client to  communicate with RNCM or LCSW in next 30 days for CCM support Client to communicate regularly with her daughter, [name], in next 30 days about client needs Client to attend scheduled medical appointments in next 30 days -  Follow Up Plan: LCSW to call client or Janice Reed on 05/05/21 to assess client needs      [name].Saniyyah Elster MSW, LCSW Licensed Clinical Social Worker [facility] Care Management [phone]

## 2021-03-25 NOTE — Patient Instructions (Signed)
Visit Information  PATIENT GOALS:  Goals Addressed             This Visit's Progress    Manage My Emotions       Protect My Health;Complete ADLs daily, as able       Timeframe:  Short-Term Goal Priority:  Medium Progress: On Track Start Date:             03/25/21              Expected End Date:           06/23/21            Follow Up Date 05/05/21   Protect My Health (Patient) Complete ADLs daily, as able    Why is this important?   Screening tests can find diseases early when they are easier to treat.  Your doctor or nurse will talk with you about which tests are important for you.  Getting shots for common diseases like the flu and shingles will help prevent them.    Patient Coping Skills: Takes medications as prescribed Attends scheduled medical appointments  Patient Deficits: Mobility issues Memory challenges  Patient Goals:   Client to communicate with RNCM or LCSW in next 30 days for CCM support Client to communicate regularly with her daughter, [name], in next 30 days about client needs Client to attend scheduled medical appointments in next 30 days -  Follow Up Plan: LCSW to call client or [name] on 05/05/21 to assess client needs     [name].Ivone Licht MSW, LCSW Licensed Clinical Social Worker [facility] Care Management [phone]

## 2021-04-19 DIAGNOSIS — H353212 Exudative age-related macular degeneration, right eye, with inactive choroidal neovascularization: Secondary | ICD-10-CM | POA: Diagnosis not present

## 2021-04-19 DIAGNOSIS — H353221 Exudative age-related macular degeneration, left eye, with active choroidal neovascularization: Secondary | ICD-10-CM | POA: Diagnosis not present

## 2021-04-19 DIAGNOSIS — H43813 Vitreous degeneration, bilateral: Secondary | ICD-10-CM | POA: Diagnosis not present

## 2021-05-05 ENCOUNTER — Ambulatory Visit (INDEPENDENT_AMBULATORY_CARE_PROVIDER_SITE_OTHER): Payer: Medicare Other | Admitting: Licensed Clinical Social Worker

## 2021-05-05 DIAGNOSIS — E039 Hypothyroidism, unspecified: Secondary | ICD-10-CM

## 2021-05-05 DIAGNOSIS — M810 Age-related osteoporosis without current pathological fracture: Secondary | ICD-10-CM

## 2021-05-05 DIAGNOSIS — E782 Mixed hyperlipidemia: Secondary | ICD-10-CM

## 2021-05-05 DIAGNOSIS — N183 Chronic kidney disease, stage 3 unspecified: Secondary | ICD-10-CM

## 2021-05-05 NOTE — Patient Instructions (Signed)
Visit Information  PATIENT GOALS:  Goals Addressed             This Visit's Progress    Protect My Health;Complete ADLs daily, as able       Timeframe:  Short-Term Goal Priority:  Medium Progress: On Track Start Date:             03/25/21              Expected End Date:          07/21/21            Follow Up Date  06/22/21 at 2:00 PM   Protect My Health (Patient) Complete ADLs daily, as able    Why is this important?   Screening tests can find diseases early when they are easier to treat.  Your doctor or nurse will talk with you about which tests are important for you.  Getting shots for common diseases like the flu and shingles will help prevent them.    Patient Coping Skills: Takes medications as prescribed Attends scheduled medical appointments  Patient Deficits: Mobility issues Memory challenges  Patient Goals:   Client to communicate with RNCM or LCSW in next 30 days for CCM support Client to communicate regularly with her daughter, Janice Reed, in next 30 days about client needs Client to attend scheduled medical appointments in next 30 days -  Follow Up Plan: LCSW to call client or Janice Reed on 06/22/21 at 2:00 PM to assess client needs     [name].Janice Reed MSW, LCSW Licensed Clinical Social Worker [facility] Care Management [phone]

## 2021-05-05 NOTE — Chronic Care Management (AMB) (Signed)
Chronic Care Management    Clinical Social Work Note  05/05/2021 Name: Janice Reed MRN: [id number] DOB: [birth year]  Janice Reed is a 87 y.o. year old female who is a primary care patient of [name], DO. The CCM team was consulted to assist the patient with chronic disease management and/or care coordination needs related to: [name] .   Engaged with patient /daughter of patient, [name], by telephone for follow up visit in response to provider referral for social work chronic care management and care coordination services.   Consent to Services:  The patient was given information about Chronic Care Management services, agreed to services, and gave verbal consent prior to initiation of services.  Please see initial visit note for detailed documentation.   Patient agreed to services and consent obtained.   Assessment: Review of patient past medical history, allergies, medications, and health status, including review of relevant consultants reports was performed today as part of a comprehensive evaluation and provision of chronic care management and care coordination services.     SDOH (Social Determinants of Health) assessments and interventions performed:  SDOH Interventions    Flowsheet Row Most Recent Value  SDOH Interventions   Physical Activity Interventions Other (Comments)  [client uses a walker to help her walk. client is at risk for falls]  Stress Interventions Provide Counseling  [client has some stress related to confusion issues]  Depression Interventions/Treatment  --  [informed [name], daughter of client, of LCSW support for client and of RNCM support for client]        Advanced Directives Status: See Vynca application for related entries.  CCM Care Plan  Allergies  Allergen Reactions   Ciprofloxacin Anaphylaxis   Benicar [Olmesartan Medoxomil] Other (See Comments)    Pt doesn't remember reaction   Clarithromycin Other (See  Comments)    Pt doesn't remember reaction   Sulfa Antibiotics Other (See Comments)    Dr told patient not to take any more   Alendronate Sodium Other (See Comments)    Upset stomach   Celebrex [Celecoxib] Other (See Comments)    Dr said that it was too strong for her   Penicillins Rash    Outpatient Encounter Medications as of 05/05/2021  Medication Sig Note   acetaminophen (TYLENOL) 650 MG CR tablet Take 650 mg by mouth every 8 (eight) hours as needed for pain.    aspirin 325 MG tablet Take 325 mg by mouth See admin instructions. Take one tablet a day and more if needed for pain - up to 3 times daily    bevacizumab (AVASTIN) 1.25 mg/0.1 mL SOLN 1.25 mg by Intravitreal route.  06/17/2019: Dr Baird Cancer ( eye specialist )    Calcium-Magnesium-Vitamin D (CITRACAL CALCIUM+D PO) Take 2 capsules by mouth 2 (two) times daily. 12/13/2020: Takes one BID   Cholecalciferol (VITAMIN D3) 1000 UNITS CAPS Take 1,000 Units by mouth 2 (two) times daily.  12/13/2020: Taking one daily   diclofenac Sodium (VOLTAREN) 1 % GEL Apply 2 g topically 4 (four) times daily. (for arthritis). if not covered, please call    levothyroxine (SYNTHROID) 112 MCG tablet Take 1 tablet (112 mcg total) by mouth daily.    mometasone (ELOCON) 0.1 % cream Apply thin layer to dry, itchy patches on back ONCE daily x7-10days.    Multiple Vitamins-Minerals (PRESERVISION AREDS PO) Take by mouth.    NONFORMULARY OR COMPOUNDED ITEM Mix triamcinolone cream 0.1% in ceravae cream (1:3) # 464 grams (1 large jar):  Apply to affected areas of legs daily.    Omega-3 Fatty Acids (FISH OIL) 1000 MG CAPS Take 1,000 mg by mouth 2 (two) times daily.    risedronate (ACTONEL) 35 MG tablet TAKE 1 TABLET BY MOUTH  WEEKLY WITH 8 OUNCE OF  PLAIN WATER 1/2 HOUR BEFORE FIRST FOOD DRINK OR MEDS.  STAY UPRIGHT FOR 1/2 HOUR (Patient taking differently: TAKE 1 TABLET BY MOUTH  WEEKLY WITH 8 OUNCE OF  PLAIN WATER 1/2 HOUR BEFORE FIRST FOOD DRINK OR MEDS.  STAY UPRIGHT FOR  1/2 HOUR)    triamterene-hydrochlorothiazide (MAXZIDE-25) 37.5-25 MG tablet Take 0.5 tablets by mouth daily.    No facility-administered encounter medications on file as of 05/05/2021.    Patient Active Problem List   Diagnosis Date Noted   Physical deconditioning 06/17/2019   Mixed stress and urge urinary incontinence 06/17/2019   Impaired mobility 05/05/2019   Benign hypertension with chronic kidney disease, stage III ([place]) 05/22/2014   Age-related macular degeneration, wet, both eyes ([place]) 03/12/2014   Internal hemorrhoids without mention of complication 15/17/6160   Immune fecal occult blood test + x 2 01/05/2012   Cancer of right breast ([place]) 09/28/2011   Hearing loss 12/27/2010   Arthritis 12/27/2010   Hyperlipidemia    Phlebitis and thrombophlebitis of unspecified site    Symptomatic menopausal or female climacteric states    Malaise and fatigue    Palpitations    Nonspecific abnormal electrocardiogram (ECG) (EKG)    Hypothyroidism    Osteoporosis    IBS (irritable bowel syndrome)     Conditions to be addressed/monitored: monitor client completion of daily ADLs, as she is able  Care Plan : [place]  Updates made by Katha Cabal, LCSW since 05/05/2021 12:00 AM     Problem: Coping Skills (General Plan of Care)      Goal: Coping Skills Enhanced;Complete ADLs daily as able   Start Date: 03/25/2021  Expected End Date: 07/21/2021  This Visit's Progress: On track  Recent Progress: On track  Priority: Medium  Note:   Current barriers:   Patient in need of assistance with connecting to community resources for possible help with client completion of ADLs and daily activities Patient is unable to independently navigate community resource options without care coordination support Mobility challenges Confusion issues Macular degeneration  Clinical Goals:  patient will work with SW monthly to  address concerns related to ADLs completion of client, as  able Patient will work with SW monthly to address concerns related to mobility of client Patient or her daughter will call RNCM or [facility] Triage Nurse as needed in next 30 days to discuss nursing needs of client  Clinical Interventions:  Collaboration with [name], DO regarding development and update of comprehensive plan of care as evidenced by provider attestation and co-signature Talked with [name], daughter of client, about client needs Talked with [name] about sleeping issues of client Talked with [name] about appetite of client Talked with [name] about ambulation of client (uses a walker to help her ambulate) Talked with [name] about client difficulty in standing (client uses a Lift chair to help her get up and down) Talked with [name] about pain issues of client Talked with [name] about upcoming client medical appointments Talked with [name] about medication procurement for client Talked with [name]  about vision of client (client has macular degeneration) Talked with [name] about [facility] aide who helps client weekly as scheduled Encouraged client or [name] to call [facility]  as needed for nursing support Talked with [name] about CCM program support for client Talked with [name] about energy level of client. [name] said client had decreased energy  Talked with [name] about confusion of client. [name] feels that client has been more confused lately. [name] stated that client is more confused when she first wakes up or when she is going to bed at night Talked with [name] about client support from Dr. Lottie Dawson, pharmacist at [facility]  Patient Coping Skills: Takes medications as prescribed Attends scheduled medical appointments  Patient Deficits: Mobility issues Memory challenges  Patient Goals:   Client to communicate with RNCM or LCSW in next 30 days for CCM support Client to communicate regularly with her daughter, [name], in next 30 days about client needs Client to attend scheduled medical  appointments in next 30 days -  Follow Up Plan: LCSW to call client or [name] on 06/22/21 at 2:00 PM to assess client needs      [name].Desteny Freeman MSW, LCSW Licensed Clinical Social Worker [facility] Care Management [phone]

## 2021-05-06 DIAGNOSIS — E782 Mixed hyperlipidemia: Secondary | ICD-10-CM

## 2021-05-06 DIAGNOSIS — N183 Chronic kidney disease, stage 3 unspecified: Secondary | ICD-10-CM | POA: Diagnosis not present

## 2021-05-06 DIAGNOSIS — M810 Age-related osteoporosis without current pathological fracture: Secondary | ICD-10-CM

## 2021-05-06 DIAGNOSIS — I129 Hypertensive chronic kidney disease with stage 1 through stage 4 chronic kidney disease, or unspecified chronic kidney disease: Secondary | ICD-10-CM | POA: Diagnosis not present

## 2021-05-06 DIAGNOSIS — E039 Hypothyroidism, unspecified: Secondary | ICD-10-CM | POA: Diagnosis not present

## 2021-05-24 DIAGNOSIS — H43813 Vitreous degeneration, bilateral: Secondary | ICD-10-CM | POA: Diagnosis not present

## 2021-05-24 DIAGNOSIS — H353221 Exudative age-related macular degeneration, left eye, with active choroidal neovascularization: Secondary | ICD-10-CM | POA: Diagnosis not present

## 2021-05-24 DIAGNOSIS — Z7689 Persons encountering health services in other specified circumstances: Secondary | ICD-10-CM | POA: Diagnosis not present

## 2021-05-24 DIAGNOSIS — H353212 Exudative age-related macular degeneration, right eye, with inactive choroidal neovascularization: Secondary | ICD-10-CM | POA: Diagnosis not present

## 2021-06-08 ENCOUNTER — Ambulatory Visit: Payer: Medicare Other

## 2021-06-22 ENCOUNTER — Telehealth: Payer: Medicare Other

## 2021-06-24 DIAGNOSIS — H353221 Exudative age-related macular degeneration, left eye, with active choroidal neovascularization: Secondary | ICD-10-CM | POA: Diagnosis not present

## 2021-06-24 DIAGNOSIS — Z7689 Persons encountering health services in other specified circumstances: Secondary | ICD-10-CM | POA: Diagnosis not present

## 2021-06-24 DIAGNOSIS — H43813 Vitreous degeneration, bilateral: Secondary | ICD-10-CM | POA: Diagnosis not present

## 2021-06-24 DIAGNOSIS — H353212 Exudative age-related macular degeneration, right eye, with inactive choroidal neovascularization: Secondary | ICD-10-CM | POA: Diagnosis not present

## 2021-08-03 DIAGNOSIS — Z7689 Persons encountering health services in other specified circumstances: Secondary | ICD-10-CM | POA: Diagnosis not present

## 2021-08-03 DIAGNOSIS — H353221 Exudative age-related macular degeneration, left eye, with active choroidal neovascularization: Secondary | ICD-10-CM | POA: Diagnosis not present

## 2021-08-23 ENCOUNTER — Ambulatory Visit (INDEPENDENT_AMBULATORY_CARE_PROVIDER_SITE_OTHER): Payer: Medicare Other | Admitting: Licensed Clinical Social Worker

## 2021-08-23 DIAGNOSIS — E039 Hypothyroidism, unspecified: Secondary | ICD-10-CM

## 2021-08-23 DIAGNOSIS — I129 Hypertensive chronic kidney disease with stage 1 through stage 4 chronic kidney disease, or unspecified chronic kidney disease: Secondary | ICD-10-CM

## 2021-08-23 DIAGNOSIS — M810 Age-related osteoporosis without current pathological fracture: Secondary | ICD-10-CM

## 2021-08-23 DIAGNOSIS — E782 Mixed hyperlipidemia: Secondary | ICD-10-CM

## 2021-08-23 DIAGNOSIS — N183 Chronic kidney disease, stage 3 unspecified: Secondary | ICD-10-CM

## 2021-08-23 NOTE — Patient Instructions (Addendum)
Visit Information  Patient Goals:  Protect My Health  (Patient). Complete ADLs daily, as able  Timeframe:  Short-Term Goal Priority:  Medium Progress: On Track Start Date:           08/23/21                 Expected End Date:          11/21/21            Follow Up Date   10/18/21 at 10:00 AM   Protect My Health (Patient) Complete ADLs daily, as able    Why is this important?   Screening tests can find diseases early when they are easier to treat.  Your doctor or nurse will talk with you about which tests are important for you.  Getting shots for common diseases like the flu and shingles will help prevent them.    Patient Coping Skills: Takes medications as prescribed Attends scheduled medical appointments  Patient Deficits: Mobility issues Memory challenges  Patient Goals:   Client to communicate with RNCM or LCSW in next 30 days for CCM support Client to communicate regularly with her daughter, [name], in next 30 days about client needs Client to attend scheduled medical appointments in next 30 days -  Follow Up Plan: LCSW to call client or Janice Reed, daughter,  on 10/18/21 at 10:00 AM to assess client needs   [name].Janice Reed MSW, [place] [employer] representative Janice Reed Care Management [phone]

## 2021-08-23 NOTE — Chronic Care Management (AMB) (Signed)
Chronic Care Management    Clinical Social Work Note  08/23/2021 Name: Janice Reed MRN: [id number] DOB: 1922/02/24  Janice Reed is a 88 y.o. year old female who is a primary care patient of Janice Norlander, DO. The CCM team was consulted to assist the patient with chronic disease management and/or care coordination needs related to: [name] .   Engaged with patient / daughter of patient, Janice Reed telephone for follow up visit in response to provider referral for social work chronic care management and care coordination services.   Consent to Services:  The patient was given information about Chronic Care Management services, agreed to services, and gave verbal consent prior to initiation of services.  Please see initial visit note for detailed documentation.   Patient agreed to services and consent obtained.   Assessment: Review of patient past medical history, allergies, medications, and health status, including review of relevant consultants reports was performed today as part of a comprehensive evaluation and provision of chronic care management and care coordination services.     SDOH (Social Determinants of Health) assessments and interventions performed:  SDOH Interventions    Flowsheet Row Most Recent Value  SDOH Interventions   Physical Activity Interventions Other (Comments)  [walking challenges, uses a walker to help her walk]  Stress Interventions Other (Comment)  [client has some stress related to confusion issues. client has some stress related to managing medical needs]  Depression Interventions/Treatment  --  [informed Janice Reed, daughter of client, of Janice Reed support for client and of RNCM support for client]        Advanced Directives Status: See Vynca application for related entries.  CCM Care Plan  Allergies  Allergen Reactions   Ciprofloxacin Anaphylaxis   Benicar [Olmesartan Medoxomil] Other (See Comments)    Pt doesn't remember  reaction   Clarithromycin Other (See Comments)    Pt doesn't remember reaction   Sulfa Antibiotics Other (See Comments)    Dr told patient not to take any more   Alendronate Sodium Other (See Comments)    Upset stomach   Celebrex [Celecoxib] Other (See Comments)    Dr said that it was too strong for her   Penicillins Rash    Outpatient Encounter Medications as of 08/23/2021  Medication Sig Note   acetaminophen (TYLENOL) 650 MG CR tablet Take 650 mg by mouth every 8 (eight) hours as needed for pain.    aspirin 325 MG tablet Take 325 mg by mouth See admin instructions. Take one tablet a day and more if needed for pain - up to 3 times daily    bevacizumab (AVASTIN) 1.25 mg/0.1 mL SOLN 1.25 mg by Intravitreal route.  06/17/2019: Dr Baird Cancer ( eye specialist )    Calcium-Magnesium-Vitamin D (CITRACAL CALCIUM+D PO) Take 2 capsules by mouth 2 (two) times daily. 12/13/2020: Takes one BID   Cholecalciferol (VITAMIN D3) 1000 UNITS CAPS Take 1,000 Units by mouth 2 (two) times daily.  12/13/2020: Taking one daily   diclofenac Sodium (VOLTAREN) 1 % GEL Apply 2 g topically 4 (four) times daily. (for arthritis). if not covered, please call    levothyroxine (SYNTHROID) 112 MCG tablet Take 1 tablet (112 mcg total) by mouth daily.    mometasone (ELOCON) 0.1 % cream Apply thin layer to dry, itchy patches on back ONCE daily x7-10days.    Multiple Vitamins-Minerals (PRESERVISION AREDS PO) Take by mouth.    NONFORMULARY OR COMPOUNDED ITEM Mix triamcinolone cream 0.1% in ceravae cream (1:3) # 416  grams (1 large jar): Apply to affected areas of legs daily.    Omega-3 Fatty Acids (FISH OIL) 1000 MG CAPS Take 1,000 mg by mouth 2 (two) times daily.    risedronate (ACTONEL) 35 MG tablet TAKE 1 TABLET BY MOUTH  WEEKLY WITH 8 OUNCE OF  PLAIN WATER 1/2 HOUR BEFORE FIRST FOOD DRINK OR MEDS.  STAY UPRIGHT FOR 1/2 HOUR (Patient taking differently: TAKE 1 TABLET BY MOUTH  WEEKLY WITH 8 OUNCE OF  PLAIN WATER 1/2 HOUR BEFORE FIRST  FOOD DRINK OR MEDS.  STAY UPRIGHT FOR 1/2 HOUR)    triamterene-hydrochlorothiazide (MAXZIDE-25) 37.5-25 MG tablet Take 0.5 tablets by mouth daily.    No facility-administered encounter medications on file as of 08/23/2021.    Patient Active Problem List   Diagnosis Date Noted   Physical deconditioning 06/17/2019   Mixed stress and urge urinary incontinence 06/17/2019   Impaired mobility 05/05/2019   Benign hypertension with chronic kidney disease, stage III ([place]) 05/22/2014   Age-related macular degeneration, wet, both eyes ([place]) 03/12/2014   Internal hemorrhoids without mention of complication 58/04/9832   Immune fecal occult blood test + x 2 01/05/2012   Cancer of right breast ([place]) 09/28/2011   Hearing loss 12/27/2010   Arthritis 12/27/2010   Hyperlipidemia    Phlebitis and thrombophlebitis of unspecified site    Symptomatic menopausal or female climacteric states    Malaise and fatigue    Palpitations    Nonspecific abnormal electrocardiogram (ECG) (EKG)    Hypothyroidism    Osteoporosis    IBS (irritable bowel syndrome)     Conditions to be addressed/monitored: monitor client completion of ADLs.  Care Plan : Janice Reed Care Plan  Updates made by Janice Cabal, Janice Reed since 08/23/2021 12:00 AM     Problem: Coping Skills (General Plan of Care)      Goal: Coping Skills Enhanced;Complete ADLs daily as able   Start Date: 08/23/2021  Expected End Date: 11/21/2021  This Visit's Progress: On track  Recent Progress: On track  Priority: Medium  Note:   Current barriers:   Patient in need of assistance with connecting to community resources for possible help with client completion of ADLs and daily activities Patient is unable to independently navigate community resource options without care coordination support Mobility challenges Confusion issues Macular degeneration  Clinical Goals:  patient will work with SW monthly to  address concerns related to ADLs completion of  client, as able Patient will work with SW monthly to address concerns related to mobility of client Patient or her daughter will call RNCM or [facility] Triage Nurse as needed in next 30 days to discuss nursing needs of client  Clinical Interventions:  Collaboration with Janice Norlander, DO regarding development and update of comprehensive plan of care as evidenced by provider attestation and co-signature Discussed client needs with Janice Reed, daughter of client. Reviewed sleeping issues of client Discussed appetite of client. Discussed pain issues of client Reviewed ambulation of client (uses a walker to help her ambulate) Discussed client difficulty in standing (client uses a Lift chair to help her get up and down) Discussed upcoming client appointments with Janice Reed Reviewed vision issues of client. Janice Reed said client sees a retina specialist in [place], [place] for eye care. Client wears glasses to help with vision Reviewed medication procurement for client Discussed in home assistance for client. Janice Reed said that [facility] aide assists client in the home for 5 hours daily on Mondays through Fridays of each week.  Encouraged client or Janice Reed to call RNCM as needed for nursing support Encouraged client or Janice Reed to call Janice Reed, [facility] Pharmacist for medication questions of client. Reviewed energy level of client. Janice Reed said client has reduced energy. Reviewed confusion issues of client. Janice Reed feels that client has been more confused lately. Janice Reed stated that client is more confused when she first wakes up or when she is going to bed at night Discussed hearing needs of client.   Patient Coping Skills: Takes medications as prescribed Attends scheduled medical appointments  Patient Deficits: Mobility issues Memory challenges  Patient Goals:   Client to communicate with RNCM or Janice Reed in next 30 days for CCM support Client to communicate regularly with her daughter, Janice Reed, in next 30  days about client needs Client to attend scheduled medical appointments in next 30 days -  Follow Up Plan: Janice Reed to call client or Janice Reed , daughter, on 10/18/21 at 10:00 AM to assess client needs     [name].Mcclain Shall MSW, [place] [employer] representative [facility] Care Management [phone]

## 2021-09-06 DIAGNOSIS — E782 Mixed hyperlipidemia: Secondary | ICD-10-CM

## 2021-09-06 DIAGNOSIS — I129 Hypertensive chronic kidney disease with stage 1 through stage 4 chronic kidney disease, or unspecified chronic kidney disease: Secondary | ICD-10-CM

## 2021-09-06 DIAGNOSIS — E039 Hypothyroidism, unspecified: Secondary | ICD-10-CM

## 2021-09-06 DIAGNOSIS — N183 Chronic kidney disease, stage 3 unspecified: Secondary | ICD-10-CM

## 2021-09-06 DIAGNOSIS — M810 Age-related osteoporosis without current pathological fracture: Secondary | ICD-10-CM

## 2021-09-07 DIAGNOSIS — H43813 Vitreous degeneration, bilateral: Secondary | ICD-10-CM | POA: Diagnosis not present

## 2021-09-07 DIAGNOSIS — Z7689 Persons encountering health services in other specified circumstances: Secondary | ICD-10-CM | POA: Diagnosis not present

## 2021-09-07 DIAGNOSIS — H353221 Exudative age-related macular degeneration, left eye, with active choroidal neovascularization: Secondary | ICD-10-CM | POA: Diagnosis not present

## 2021-09-07 DIAGNOSIS — H353212 Exudative age-related macular degeneration, right eye, with inactive choroidal neovascularization: Secondary | ICD-10-CM | POA: Diagnosis not present

## 2021-10-12 DIAGNOSIS — H353212 Exudative age-related macular degeneration, right eye, with inactive choroidal neovascularization: Secondary | ICD-10-CM | POA: Diagnosis not present

## 2021-10-12 DIAGNOSIS — H353221 Exudative age-related macular degeneration, left eye, with active choroidal neovascularization: Secondary | ICD-10-CM | POA: Diagnosis not present

## 2021-10-12 DIAGNOSIS — Z7689 Persons encountering health services in other specified circumstances: Secondary | ICD-10-CM | POA: Diagnosis not present

## 2021-10-12 DIAGNOSIS — H43813 Vitreous degeneration, bilateral: Secondary | ICD-10-CM | POA: Diagnosis not present

## 2021-10-18 ENCOUNTER — Ambulatory Visit (INDEPENDENT_AMBULATORY_CARE_PROVIDER_SITE_OTHER): Payer: Medicare Other | Admitting: Licensed Clinical Social Worker

## 2021-10-18 DIAGNOSIS — E039 Hypothyroidism, unspecified: Secondary | ICD-10-CM

## 2021-10-18 DIAGNOSIS — E782 Mixed hyperlipidemia: Secondary | ICD-10-CM

## 2021-10-18 DIAGNOSIS — I129 Hypertensive chronic kidney disease with stage 1 through stage 4 chronic kidney disease, or unspecified chronic kidney disease: Secondary | ICD-10-CM

## 2021-10-18 DIAGNOSIS — M810 Age-related osteoporosis without current pathological fracture: Secondary | ICD-10-CM

## 2021-10-18 NOTE — Patient Instructions (Addendum)
Visit Information ? ?Patient goals:  Protect My Health (Patient). Complete ADLs daily as able ? ?Timeframe:  Short-Term Goal ?Priority:  Medium ?Progress: On Track ?Start Date:           10/18/21                   ?Expected End Date:          01/16/22           ? ?Follow Up Date   12/12/21 at 10:00 AM ?  ?Protect My Health (Patient) Complete ADLs daily, as able  ?  ?Why is this important?   ?Screening tests can find diseases early when they are easier to treat.  ?Your doctor or nurse will talk with you about which tests are important for you.  ?Getting shots for common diseases like the flu and shingles will help prevent them.   ? ?Patient Coping Skills: ?Takes medications as prescribed ?Attends scheduled medical appointments ? ?Patient Deficits: ?Mobility issues ?Memory challenges ? ?Patient Goals:  ? ?Client to communicate with RNCM or LCSW in next 30 days for CCM support ?Client to communicate regularly with her daughter, [name], in next 30 days about client needs ?Client to attend scheduled medical appointments in next 30 days ?-  ?Follow Up Plan: LCSW to call client or [name], daughter,  on 12/12/21 at 10:00 AM to assess client needs  ? ?[name].Silverio Hagan MSW, LCSW ?Licensed Clinical Social Worker ?[place] Management ?[phone] ?

## 2021-10-18 NOTE — Chronic Care Management (AMB) (Signed)
?Chronic Care Management  ? ? Clinical Social Work Note ? ?10/18/2021 ?Name: Janice Reed MRN: [id number] DOB: October 08, 1921 ? ?Janice Reed is a 88 y.o. year old female who is a primary care patient of [name], DO. The CCM team was consulted to assist the patient with chronic disease management and/or care coordination needs related to: [name] .  ? ?Engaged with patient /daughter of patient, Janice Reed, by telephone for follow up visit in response to provider referral for social work chronic care management and care coordination services.  ? ?Consent to Services:  ?The patient was given information about Chronic Care Management services, agreed to services, and gave verbal consent prior to initiation of services.  Please see initial visit note for detailed documentation.  ? ?Patient agreed to services and consent obtained.  ? ?Assessment: Review of patient past medical history, allergies, medications, and health status, including review of relevant consultants reports was performed today as part of a comprehensive evaluation and provision of chronic care management and care coordination services.    ? ?SDOH (Social Determinants of Health) assessments and interventions performed:  ?SDOH Interventions   ? ?Flowsheet Row Most Recent Value  ?SDOH Interventions   ?Physical Activity Interventions Other (Comments)  [walking challenges. she uses a walker to help her walk]  ?Stress Interventions Provide Counseling  [client has stress related to managing medical needs]  ?Depression Interventions/Treatment  Counseling  ? ?  ?  ? ?Advanced Directives Status: See Vynca application for related entries. ? ?CCM Care Plan ? ?Allergies  ?Allergen Reactions  ? Ciprofloxacin Anaphylaxis  ? Benicar [Olmesartan Medoxomil] Other (See Comments)  ?  Pt doesn't remember reaction  ? Clarithromycin Other (See Comments)  ?  Pt doesn't remember reaction  ? Sulfa Antibiotics Other (See Comments)  ?  Dr told patient not to  take any more  ? Alendronate Sodium Other (See Comments)  ?  Upset stomach  ? Celebrex [Celecoxib] Other (See Comments)  ?  Dr said that it was too strong for her  ? Penicillins Rash  ? ? ?Outpatient Encounter Medications as of 10/18/2021  ?Medication Sig Note  ? acetaminophen (TYLENOL) 650 MG CR tablet Take 650 mg by mouth every 8 (eight) hours as needed for pain.   ? aspirin 325 MG tablet Take 325 mg by mouth See admin instructions. Take one tablet a day and more if needed for pain - up to 3 times daily   ? bevacizumab (AVASTIN) 1.25 mg/0.1 mL SOLN 1.25 mg by Intravitreal route.  06/17/2019: Dr Baird Cancer ( eye specialist )   ? Calcium-Magnesium-Vitamin D (CITRACAL CALCIUM+D PO) Take 2 capsules by mouth 2 (two) times daily. 12/13/2020: Takes one BID  ? Cholecalciferol (VITAMIN D3) 1000 UNITS CAPS Take 1,000 Units by mouth 2 (two) times daily.  12/13/2020: Taking one daily  ? diclofenac Sodium (VOLTAREN) 1 % GEL Apply 2 g topically 4 (four) times daily. (for arthritis). if not covered, please call   ? levothyroxine (SYNTHROID) 112 MCG tablet Take 1 tablet (112 mcg total) by mouth daily.   ? mometasone (ELOCON) 0.1 % cream Apply thin layer to dry, itchy patches on back ONCE daily x7-10days.   ? Multiple Vitamins-Minerals (PRESERVISION AREDS PO) Take by mouth.   ? NONFORMULARY OR COMPOUNDED ITEM Mix triamcinolone cream 0.1% in ceravae cream (1:3) # 464 grams (1 large jar): Apply to affected areas of legs daily.   ? Omega-3 Fatty Acids (FISH OIL) 1000 MG CAPS Take 1,000 mg by  mouth 2 (two) times daily.   ? risedronate (ACTONEL) 35 MG tablet TAKE 1 TABLET BY MOUTH  WEEKLY WITH 8 OUNCE OF  PLAIN WATER 1/2 HOUR BEFORE FIRST FOOD DRINK OR MEDS.  STAY UPRIGHT FOR 1/2 HOUR (Patient taking differently: TAKE 1 TABLET BY MOUTH  WEEKLY WITH 8 OUNCE OF  PLAIN WATER 1/2 HOUR BEFORE FIRST FOOD DRINK OR MEDS.  STAY UPRIGHT FOR 1/2 HOUR)   ? triamterene-hydrochlorothiazide (MAXZIDE-25) 37.5-25 MG tablet Take 0.5 tablets by mouth daily.    ? ?No facility-administered encounter medications on file as of 10/18/2021.  ? ? ?Patient Active Problem List  ? Diagnosis Date Noted  ? Physical deconditioning 06/17/2019  ? Mixed stress and urge urinary incontinence 06/17/2019  ? Impaired mobility 05/05/2019  ? Benign hypertension with chronic kidney disease, stage III ([place]) 05/22/2014  ? Age-related macular degeneration, wet, both eyes ([place]) 03/12/2014  ? Internal hemorrhoids without mention of complication 10/26/2246  ? Immune fecal occult blood test + x 2 01/05/2012  ? Cancer of right breast ([place]) 09/28/2011  ? Hearing loss 12/27/2010  ? Arthritis 12/27/2010  ? Hyperlipidemia   ? Phlebitis and thrombophlebitis of unspecified site   ? Symptomatic menopausal or female climacteric states   ? Malaise and fatigue   ? Palpitations   ? Nonspecific abnormal electrocardiogram (ECG) (EKG)   ? Hypothyroidism   ? Osteoporosis   ? IBS (irritable bowel syndrome)   ? ? ?Conditions to be addressed/monitored: monitor client completion of ADLs ? ?Care Plan : LCSW Care Plan  ?Updates made by Janice Cabal, LCSW since 10/18/2021 12:00 AM  ?  ? ?Problem: Coping Skills (General Plan of Care)   ?  ? ?Goal: Coping Skills Enhanced;Complete ADLs daily as able   ?Start Date: 10/18/2021  ?Expected End Date: 01/17/2022  ?This Visit's Progress: On track  ?Recent Progress: On track  ?Priority: Medium  ?Note:   ?Current barriers:   ?Patient in need of assistance with connecting to community resources for possible help with client completion of ADLs and daily activities ?Patient is unable to independently navigate community resource options without care coordination support ?Mobility challenges ?Confusion issues ?Macular degeneration ? ?Clinical Goals:  ?patient will work with SW monthly to  address concerns related to ADLs completion of client, as able ?Patient will work with SW monthly to address concerns related to mobility of client ?Patient or her daughter will call RNCM or [facility] Triage  Nurse as needed in next 30 days to discuss nursing needs of client ? ?Clinical Interventions:  ?Collaboration with [name], DO regarding development and update of comprehensive plan of care as evidenced by provider attestation and co-signature ?Discussed client needs with Janice Reed ?Reviewed sleeping issues of client. Janice Reed said she is sleeping well ?Discussed appetite of client. Janice Reed said she is eating adequately.  Discussed pain issues of client. Client said she has some ongoing pain issues ?Reviewed ambulation of client (uses a walker to help her ambulate) ?Discussed client difficulty in standing (client uses a Lift chair to help her get up and down) ?Discussed upcoming client appointments with Janice Reed, daughter of client.  ?Reviewed vision issues of client. Client sees a retina specialist in [place], [place] for eye care. Client wears glasses to help with vision. Client has macular degeneration.  ?Reviewed medication procurement for client ?Discussed in home assistance for client. Janice Reed said that [facility] aide assists client in the home for 5 hours daily on Mondays through Fridays of each week.  ?  Encouraged client or Janice Reed to call RNCM as needed for nursing support ?Reviewed energy level of client. Client said she has reduced energy. ? ?Patient Coping Skills: ?Takes medications as prescribed ?Attends scheduled medical appointments ? ?Patient Deficits: ?Mobility issues ?Memory challenges ? ?Patient Goals:  ? ?Client to communicate with RNCM or LCSW in next 30 days for CCM support ?Client to communicate regularly with her daughter, Janice Reed, in next 30 days about client needs ?Client to attend scheduled medical appointments in next 30 days ?-  ?Follow Up Plan: LCSW to call client or Janice Reed , daughter, on 12/12/21 at 10:00 AM to assess client needs   ?  ?Janice Reed MSW, LCSW ?Licensed Clinical Social Worker ?[employer] ?[phone] ?

## 2021-10-25 DIAGNOSIS — M79676 Pain in unspecified toe(s): Secondary | ICD-10-CM | POA: Diagnosis not present

## 2021-10-25 DIAGNOSIS — B351 Tinea unguium: Secondary | ICD-10-CM | POA: Diagnosis not present

## 2021-10-31 ENCOUNTER — Ambulatory Visit (INDEPENDENT_AMBULATORY_CARE_PROVIDER_SITE_OTHER): Payer: Medicare Other | Admitting: Family Medicine

## 2021-10-31 ENCOUNTER — Encounter: Payer: Self-pay | Admitting: Family Medicine

## 2021-10-31 VITALS — BP 130/78 | HR 89 | Temp 98.4°F | Ht 60.0 in | Wt 115.2 lb

## 2021-10-31 DIAGNOSIS — M4124 Other idiopathic scoliosis, thoracic region: Secondary | ICD-10-CM

## 2021-10-31 DIAGNOSIS — E039 Hypothyroidism, unspecified: Secondary | ICD-10-CM | POA: Diagnosis not present

## 2021-10-31 DIAGNOSIS — N183 Chronic kidney disease, stage 3 unspecified: Secondary | ICD-10-CM | POA: Diagnosis not present

## 2021-10-31 DIAGNOSIS — Z9181 History of falling: Secondary | ICD-10-CM | POA: Diagnosis not present

## 2021-10-31 DIAGNOSIS — I129 Hypertensive chronic kidney disease with stage 1 through stage 4 chronic kidney disease, or unspecified chronic kidney disease: Secondary | ICD-10-CM

## 2021-10-31 DIAGNOSIS — M810 Age-related osteoporosis without current pathological fracture: Secondary | ICD-10-CM

## 2021-10-31 DIAGNOSIS — Z7409 Other reduced mobility: Secondary | ICD-10-CM

## 2021-10-31 DIAGNOSIS — E782 Mixed hyperlipidemia: Secondary | ICD-10-CM

## 2021-10-31 DIAGNOSIS — M159 Polyosteoarthritis, unspecified: Secondary | ICD-10-CM | POA: Diagnosis not present

## 2021-10-31 NOTE — Patient Instructions (Signed)
You had labs performed today.  You will be contacted with the results of the labs once they are available, usually in the next 3 business days for routine lab work.  If you have an active my chart account, they will be released to your MyChart.  If you prefer to have these labs released to you via telephone, please let us know.     

## 2021-10-31 NOTE — Progress Notes (Signed)
? ?Subjective: ?CC: Follow-up hypertension, hyperlipidemia, CKD ?PCP: [name], DO ?Janice Reed is a 88 y.o. female presenting to clinic today for: ? ?1.  Hypertension with hyperlipidemia and chronic kidney disease stage III ?Patient is compliant with her Maxide at 0.5 mg of the 37.5-25 mg tablet.  She has not treated with any prescription cholesterol medication but she does use over-the-counter fish oils.  No chest pain, shortness of breath reported. ? ?2.  Hypothyroidism ?Patient is compliant with Synthroid 112 mcg daily.  She has been successfully gaining weight with adequate appetite.  She hydrates well.  No tremor, heart palpitations or unplanned weight loss reported.  Sometimes she does have difficulty moving her bowels each morning but throughout the day she is successful with this. ? ?3.  Generalized weakness ?Patient reports worrying about falling again.  She has not fallen in over a year but she feels like her legs are weak and could give out from underneath her.  She uses a rolling walker for ambulation.  She suffers from lower extremity edema.  Has Tylenol at home for osteoarthritis but is interested in maybe starting some type of cream or gel ? ? ?ROS: Per HPI ? ?Allergies  ?Allergen Reactions  ? Ciprofloxacin Anaphylaxis  ? Benicar [Olmesartan Medoxomil] Other (See Comments)  ?  Pt doesn't remember reaction  ? Clarithromycin Other (See Comments)  ?  Pt doesn't remember reaction  ? Sulfa Antibiotics Other (See Comments)  ?  Dr told patient not to take any more  ? Alendronate Sodium Other (See Comments)  ?  Upset stomach  ? Celebrex [Celecoxib] Other (See Comments)  ?  Dr said that it was too strong for her  ? Penicillins Rash  ? ?Past Medical History:  ?Diagnosis Date  ? Arthritis   ? Arthritis pain   ? Benign hypertensive heart disease   ? Breast cancer ([place])   ? Cancer of right breast ([place]) 09/28/2011  ? IDC;  T2, N0 ([facility]);   ER+;  Her-2 neg.,  Right PM,SLN  09/08/08   ? Collagenous  colitis   ? Diverticulosis of colon   ? External hemorrhoid   ? Hearing loss   ? Hypertension   ? IBS (irritable bowel syndrome)   ? Macular degeneration   ? Macular degeneration of both eyes   ? Neuropathy, peripheral   ? Nonspecific abnormal electrocardiogram (ECG) (EKG)   ? Osteoporosis   ? Other and unspecified hyperlipidemia   ? Palpitations   ? Phlebitis and thrombophlebitis of unspecified site   ? Prolapse of vaginal walls without mention of uterine prolapse   ? Shingles   ? Symptomatic menopausal or female climacteric states   ? Unspecified hypothyroidism   ? ? ?Current Outpatient Medications:  ?  acetaminophen (TYLENOL) 650 MG CR tablet, Take 650 mg by mouth every 8 (eight) hours as needed for pain., Disp: , Rfl:  ?  aspirin 325 MG tablet, Take 325 mg by mouth See admin instructions. Take one tablet a day and more if needed for pain - up to 3 times daily, Disp: , Rfl:  ?  bevacizumab (AVASTIN) 1.25 mg/0.1 mL SOLN, 1.25 mg by Intravitreal route. , Disp: , Rfl:  ?  Calcium-Magnesium-Vitamin D (CITRACAL CALCIUM+D PO), Take 2 capsules by mouth 2 (two) times daily., Disp: , Rfl:  ?  Cholecalciferol (VITAMIN D3) 1000 UNITS CAPS, Take 1,000 Units by mouth 2 (two) times daily. , Disp: , Rfl:  ?  diclofenac Sodium (VOLTAREN) 1 %  GEL, Apply 2 g topically 4 (four) times daily. (for arthritis). if not covered, please call, Disp: 400 g, Rfl: 0 ?  levothyroxine (SYNTHROID) 112 MCG tablet, Take 1 tablet (112 mcg total) by mouth daily., Disp: 90 tablet, Rfl: 0 ?  mometasone (ELOCON) 0.1 % cream, Apply thin layer to dry, itchy patches on back ONCE daily x7-10days., Disp: 45 g, Rfl: 0 ?  Multiple Vitamins-Minerals (PRESERVISION AREDS PO), Take by mouth., Disp: , Rfl:  ?  NONFORMULARY OR COMPOUNDED ITEM, Mix triamcinolone cream 0.1% in ceravae cream (1:3) # 464 grams (1 large jar): Apply to affected areas of legs daily., Disp: 464 each, Rfl: 1 ?  Omega-3 Fatty Acids (FISH OIL) 1000 MG CAPS, Take 1,000 mg by mouth 2 (two)  times daily., Disp: , Rfl:  ?  risedronate (ACTONEL) 35 MG tablet, TAKE 1 TABLET BY MOUTH  WEEKLY WITH 8 OUNCE OF  PLAIN WATER 1/2 HOUR BEFORE FIRST FOOD DRINK OR MEDS.  STAY UPRIGHT FOR 1/2 HOUR (Patient taking differently: TAKE 1 TABLET BY MOUTH  WEEKLY WITH 8 OUNCE OF  PLAIN WATER 1/2 HOUR BEFORE FIRST FOOD DRINK OR MEDS.  STAY UPRIGHT FOR 1/2 HOUR), Disp: 12 tablet, Rfl: 0 ?  triamterene-hydrochlorothiazide (MAXZIDE-25) 37.5-25 MG tablet, Take 0.5 tablets by mouth daily., Disp: 45 tablet, Rfl: 3 ?Social History  ? ?Socioeconomic History  ? Marital status: Widowed  ?  Spouse name: Not on file  ? Number of children: 2  ? Years of education: 16  ? Highest education level: 12th grade  ?Occupational History  ? Occupation: retired-homemaker  ?Tobacco Use  ? Smoking status: Never  ? Smokeless tobacco: Never  ?Vaping Use  ? Vaping Use: Never used  ?Substance and Sexual Activity  ? Alcohol use: No  ? Drug use: No  ? Sexual activity: Not Currently  ?Other Topics Concern  ? Not on file  ?Social History Narrative  ? Widowed, 1 son one daughter, retired and homemaker. No alcohol or caffeine or tobacco.  ? She lives alone; Her daughter lives next door  ? Daughter, [name] is [employer] POA  ? She has a Actuary, Mariann Laster 5 hours a day, 5 days per week.  ? ?Social Determinants of Health  ? ?Financial Resource Strain: Low Risk   ? Difficulty of Paying Living Expenses: Not hard at all  ?Food Insecurity: No Food Insecurity  ? Worried About Charity fundraiser in the Last Year: Never true  ? Ran Out of Food in the Last Year: Never true  ?Transportation Needs: No Transportation Needs  ? Lack of Transportation (Medical): No  ? Lack of Transportation (Non-Medical): No  ?Physical Activity: Inactive  ? Days of Exercise per Week: 0 days  ? Minutes of Exercise per Session: 0 min  ?Stress: Stress Concern Present  ? Feeling of Stress : To some extent  ?Social Connections: Moderately Integrated  ? Frequency of Communication with Friends and Family:  More than three times a week  ? Frequency of Social Gatherings with Friends and Family: More than three times a week  ? Attends Religious Services: More than 4 times per year  ? Active Member of Clubs or Organizations: Yes  ? Attends Archivist Meetings: Never  ? Marital Status: Widowed  ?Intimate Partner Violence: Not At Risk  ? Fear of Current or Ex-Partner: No  ? Emotionally Abused: No  ? Physically Abused: No  ? Sexually Abused: No  ? ?Family History  ?Problem Relation Age of Onset  ?  Heart disease Mother   ?     Heart failure per medical history form dated 09/27/09.  ? Heart disease Father   ?     Heart attack per medical history form dated 09/27/09.  ? Heart attack Father   ? Heart disease Brother   ?     Heart failure per medical history form dated 09/27/09.  ? Stroke Brother   ? Stroke Brother   ? Leukemia Brother   ? Breast cancer Cousin   ? Healthy Daughter   ? Healthy Son   ? Colon cancer Neg Hx   ? ? ?Objective: ?Office vital signs reviewed. ?BP 130/78   Pulse 89   Temp 98.4 ?F (36.9 ?C)   Ht 5' (1.524 m)   Wt 115 lb 3.2 oz (52.3 kg)   SpO2 92%   BMI 22.50 kg/m?  ? ?Physical Examination:  ?General: Awake, alert, thin, elderly female, No acute distress ?HEENT: Sclera white.  Notes of thalmus.  No goiter. ?Cardio: regular rate and rhythm.occasional skipped beat.  S1S2 heard, no murmurs appreciated ?Pulm: clear to auscultation bilaterally, no wheezes, rhonchi or rales; normal work of breathing on room air ?Extremities: warm, well perfused, mild lower extremity edema left greater than right, no cyanosis or clubbing; +2 pulses bilaterally ?MSK: Very hunched station, marked thoracic scoliotic curve.  Utilizing rolling walker for ambulation with shuffled gait ? ?Assessment/ Plan: ?88 y.o. female  ? ?Acquired hypothyroidism - Plan: TSH, T4, Free ? ?Mixed hyperlipidemia - Plan: CMP14+EGFR, Lipid Panel ? ?Age-related osteoporosis without current pathological fracture - Plan: CMP14+EGFR, VITAMIN D 25  Hydroxy (Vit-D Deficiency, Fractures) ? ?Benign hypertension with chronic kidney disease, stage III (HCC) - Plan: CMP14+EGFR, VITAMIN D 25 Hydroxy (Vit-D Deficiency, Fractures), CBC ? ?Primary osteoarthritis inv

## 2021-11-01 LAB — CMP14+EGFR
ALT: 11 IU/L (ref 0–32)
AST: 14 IU/L (ref 0–40)
Albumin/Globulin Ratio: 1.6 (ref 1.2–2.2)
Albumin: 4.2 g/dL (ref 3.5–4.6)
Alkaline Phosphatase: 100 IU/L (ref 44–121)
BUN/Creatinine Ratio: 19 (ref 12–28)
BUN: 19 mg/dL (ref 10–36)
Bilirubin Total: 0.5 mg/dL (ref 0.0–1.2)
CO2: 26 mmol/L (ref 20–29)
Calcium: 9.9 mg/dL (ref 8.7–10.3)
Chloride: 101 mmol/L (ref 96–106)
Creatinine, Ser: 1.01 mg/dL — ABNORMAL HIGH (ref 0.57–1.00)
Globulin, Total: 2.7 g/dL (ref 1.5–4.5)
Glucose: 102 mg/dL — ABNORMAL HIGH (ref 70–99)
Potassium: 4.9 mmol/L (ref 3.5–5.2)
Sodium: 140 mmol/L (ref 134–144)
Total Protein: 6.9 g/dL (ref 6.0–8.5)
eGFR: 51 mL/min/{1.73_m2} — ABNORMAL LOW (ref >59)

## 2021-11-01 LAB — CBC
Hematocrit: 42.8 % (ref 34.0–46.6)
Hemoglobin: 14.4 g/dL (ref 11.1–15.9)
MCH: 30.7 pg (ref 26.6–33.0)
MCHC: 33.6 g/dL (ref 31.5–35.7)
MCV: 91 fL (ref 79–97)
Platelets: 213 10*3/uL (ref 150–450)
RBC: 4.69 x10E6/uL (ref 3.77–5.28)
RDW: 14.4 % (ref 11.7–15.4)
WBC: 7.1 10*3/uL (ref 3.4–10.8)

## 2021-11-01 LAB — T4, FREE: Free T4: 0.92 ng/dL (ref 0.82–1.77)

## 2021-11-01 LAB — LIPID PANEL
Chol/HDL Ratio: 4 ratio (ref 0.0–4.4)
Cholesterol, Total: 287 mg/dL — ABNORMAL HIGH (ref 100–199)
HDL: 71 mg/dL (ref >39)
LDL Chol Calc (NIH): 195 mg/dL — ABNORMAL HIGH (ref 0–99)
Triglycerides: 121 mg/dL (ref 0–149)
VLDL Cholesterol Cal: 21 mg/dL (ref 5–40)

## 2021-11-01 LAB — TSH: TSH: 53.9 u[IU]/mL — ABNORMAL HIGH (ref 0.450–4.500)

## 2021-11-01 LAB — VITAMIN D 25 HYDROXY (VIT D DEFICIENCY, FRACTURES): Vit D, 25-Hydroxy: 43.4 ng/mL (ref 30.0–100.0)

## 2021-11-11 DIAGNOSIS — H9190 Unspecified hearing loss, unspecified ear: Secondary | ICD-10-CM | POA: Diagnosis not present

## 2021-11-11 DIAGNOSIS — M4124 Other idiopathic scoliosis, thoracic region: Secondary | ICD-10-CM | POA: Diagnosis not present

## 2021-11-11 DIAGNOSIS — G629 Polyneuropathy, unspecified: Secondary | ICD-10-CM | POA: Diagnosis not present

## 2021-11-11 DIAGNOSIS — E782 Mixed hyperlipidemia: Secondary | ICD-10-CM | POA: Diagnosis not present

## 2021-11-11 DIAGNOSIS — M150 Primary generalized (osteo)arthritis: Secondary | ICD-10-CM | POA: Diagnosis not present

## 2021-11-11 DIAGNOSIS — E039 Hypothyroidism, unspecified: Secondary | ICD-10-CM | POA: Diagnosis not present

## 2021-11-11 DIAGNOSIS — Z9181 History of falling: Secondary | ICD-10-CM | POA: Diagnosis not present

## 2021-11-11 DIAGNOSIS — I129 Hypertensive chronic kidney disease with stage 1 through stage 4 chronic kidney disease, or unspecified chronic kidney disease: Secondary | ICD-10-CM | POA: Diagnosis not present

## 2021-11-11 DIAGNOSIS — M810 Age-related osteoporosis without current pathological fracture: Secondary | ICD-10-CM | POA: Diagnosis not present

## 2021-11-11 DIAGNOSIS — N183 Chronic kidney disease, stage 3 unspecified: Secondary | ICD-10-CM | POA: Diagnosis not present

## 2021-11-11 DIAGNOSIS — Z853 Personal history of malignant neoplasm of breast: Secondary | ICD-10-CM | POA: Diagnosis not present

## 2021-11-14 DIAGNOSIS — G629 Polyneuropathy, unspecified: Secondary | ICD-10-CM | POA: Diagnosis not present

## 2021-11-14 DIAGNOSIS — M4124 Other idiopathic scoliosis, thoracic region: Secondary | ICD-10-CM | POA: Diagnosis not present

## 2021-11-14 DIAGNOSIS — Z853 Personal history of malignant neoplasm of breast: Secondary | ICD-10-CM | POA: Diagnosis not present

## 2021-11-14 DIAGNOSIS — Z9181 History of falling: Secondary | ICD-10-CM | POA: Diagnosis not present

## 2021-11-14 DIAGNOSIS — E782 Mixed hyperlipidemia: Secondary | ICD-10-CM | POA: Diagnosis not present

## 2021-11-14 DIAGNOSIS — H9190 Unspecified hearing loss, unspecified ear: Secondary | ICD-10-CM | POA: Diagnosis not present

## 2021-11-14 DIAGNOSIS — N183 Chronic kidney disease, stage 3 unspecified: Secondary | ICD-10-CM | POA: Diagnosis not present

## 2021-11-14 DIAGNOSIS — E039 Hypothyroidism, unspecified: Secondary | ICD-10-CM | POA: Diagnosis not present

## 2021-11-14 DIAGNOSIS — M150 Primary generalized (osteo)arthritis: Secondary | ICD-10-CM | POA: Diagnosis not present

## 2021-11-14 DIAGNOSIS — I129 Hypertensive chronic kidney disease with stage 1 through stage 4 chronic kidney disease, or unspecified chronic kidney disease: Secondary | ICD-10-CM | POA: Diagnosis not present

## 2021-11-14 DIAGNOSIS — M810 Age-related osteoporosis without current pathological fracture: Secondary | ICD-10-CM | POA: Diagnosis not present

## 2021-11-16 DIAGNOSIS — H353212 Exudative age-related macular degeneration, right eye, with inactive choroidal neovascularization: Secondary | ICD-10-CM | POA: Diagnosis not present

## 2021-11-16 DIAGNOSIS — Z7689 Persons encountering health services in other specified circumstances: Secondary | ICD-10-CM | POA: Diagnosis not present

## 2021-11-16 DIAGNOSIS — H353221 Exudative age-related macular degeneration, left eye, with active choroidal neovascularization: Secondary | ICD-10-CM | POA: Diagnosis not present

## 2021-11-16 DIAGNOSIS — H43813 Vitreous degeneration, bilateral: Secondary | ICD-10-CM | POA: Diagnosis not present

## 2021-11-18 DIAGNOSIS — G629 Polyneuropathy, unspecified: Secondary | ICD-10-CM | POA: Diagnosis not present

## 2021-11-18 DIAGNOSIS — I129 Hypertensive chronic kidney disease with stage 1 through stage 4 chronic kidney disease, or unspecified chronic kidney disease: Secondary | ICD-10-CM | POA: Diagnosis not present

## 2021-11-18 DIAGNOSIS — M810 Age-related osteoporosis without current pathological fracture: Secondary | ICD-10-CM | POA: Diagnosis not present

## 2021-11-18 DIAGNOSIS — M150 Primary generalized (osteo)arthritis: Secondary | ICD-10-CM | POA: Diagnosis not present

## 2021-11-18 DIAGNOSIS — E039 Hypothyroidism, unspecified: Secondary | ICD-10-CM | POA: Diagnosis not present

## 2021-11-18 DIAGNOSIS — Z853 Personal history of malignant neoplasm of breast: Secondary | ICD-10-CM | POA: Diagnosis not present

## 2021-11-18 DIAGNOSIS — H9190 Unspecified hearing loss, unspecified ear: Secondary | ICD-10-CM | POA: Diagnosis not present

## 2021-11-18 DIAGNOSIS — N183 Chronic kidney disease, stage 3 unspecified: Secondary | ICD-10-CM | POA: Diagnosis not present

## 2021-11-18 DIAGNOSIS — E782 Mixed hyperlipidemia: Secondary | ICD-10-CM | POA: Diagnosis not present

## 2021-11-18 DIAGNOSIS — Z9181 History of falling: Secondary | ICD-10-CM | POA: Diagnosis not present

## 2021-11-18 DIAGNOSIS — M4124 Other idiopathic scoliosis, thoracic region: Secondary | ICD-10-CM | POA: Diagnosis not present

## 2021-11-21 DIAGNOSIS — Z9181 History of falling: Secondary | ICD-10-CM | POA: Diagnosis not present

## 2021-11-21 DIAGNOSIS — E039 Hypothyroidism, unspecified: Secondary | ICD-10-CM | POA: Diagnosis not present

## 2021-11-21 DIAGNOSIS — M150 Primary generalized (osteo)arthritis: Secondary | ICD-10-CM | POA: Diagnosis not present

## 2021-11-21 DIAGNOSIS — H9190 Unspecified hearing loss, unspecified ear: Secondary | ICD-10-CM | POA: Diagnosis not present

## 2021-11-21 DIAGNOSIS — I129 Hypertensive chronic kidney disease with stage 1 through stage 4 chronic kidney disease, or unspecified chronic kidney disease: Secondary | ICD-10-CM | POA: Diagnosis not present

## 2021-11-21 DIAGNOSIS — E782 Mixed hyperlipidemia: Secondary | ICD-10-CM | POA: Diagnosis not present

## 2021-11-21 DIAGNOSIS — M4124 Other idiopathic scoliosis, thoracic region: Secondary | ICD-10-CM | POA: Diagnosis not present

## 2021-11-21 DIAGNOSIS — Z853 Personal history of malignant neoplasm of breast: Secondary | ICD-10-CM | POA: Diagnosis not present

## 2021-11-21 DIAGNOSIS — M810 Age-related osteoporosis without current pathological fracture: Secondary | ICD-10-CM | POA: Diagnosis not present

## 2021-11-21 DIAGNOSIS — G629 Polyneuropathy, unspecified: Secondary | ICD-10-CM | POA: Diagnosis not present

## 2021-11-21 DIAGNOSIS — N183 Chronic kidney disease, stage 3 unspecified: Secondary | ICD-10-CM | POA: Diagnosis not present

## 2021-11-23 DIAGNOSIS — I129 Hypertensive chronic kidney disease with stage 1 through stage 4 chronic kidney disease, or unspecified chronic kidney disease: Secondary | ICD-10-CM | POA: Diagnosis not present

## 2021-11-23 DIAGNOSIS — M810 Age-related osteoporosis without current pathological fracture: Secondary | ICD-10-CM | POA: Diagnosis not present

## 2021-11-23 DIAGNOSIS — M4124 Other idiopathic scoliosis, thoracic region: Secondary | ICD-10-CM | POA: Diagnosis not present

## 2021-11-23 DIAGNOSIS — Z9181 History of falling: Secondary | ICD-10-CM | POA: Diagnosis not present

## 2021-11-23 DIAGNOSIS — N183 Chronic kidney disease, stage 3 unspecified: Secondary | ICD-10-CM | POA: Diagnosis not present

## 2021-11-23 DIAGNOSIS — G629 Polyneuropathy, unspecified: Secondary | ICD-10-CM | POA: Diagnosis not present

## 2021-11-23 DIAGNOSIS — H9190 Unspecified hearing loss, unspecified ear: Secondary | ICD-10-CM | POA: Diagnosis not present

## 2021-11-23 DIAGNOSIS — E039 Hypothyroidism, unspecified: Secondary | ICD-10-CM | POA: Diagnosis not present

## 2021-11-23 DIAGNOSIS — M150 Primary generalized (osteo)arthritis: Secondary | ICD-10-CM | POA: Diagnosis not present

## 2021-11-23 DIAGNOSIS — Z853 Personal history of malignant neoplasm of breast: Secondary | ICD-10-CM | POA: Diagnosis not present

## 2021-11-23 DIAGNOSIS — E782 Mixed hyperlipidemia: Secondary | ICD-10-CM | POA: Diagnosis not present

## 2021-11-25 ENCOUNTER — Ambulatory Visit (INDEPENDENT_AMBULATORY_CARE_PROVIDER_SITE_OTHER): Payer: Medicare Other

## 2021-11-25 DIAGNOSIS — M4124 Other idiopathic scoliosis, thoracic region: Secondary | ICD-10-CM

## 2021-11-25 DIAGNOSIS — Z853 Personal history of malignant neoplasm of breast: Secondary | ICD-10-CM

## 2021-11-25 DIAGNOSIS — I129 Hypertensive chronic kidney disease with stage 1 through stage 4 chronic kidney disease, or unspecified chronic kidney disease: Secondary | ICD-10-CM

## 2021-11-25 DIAGNOSIS — E039 Hypothyroidism, unspecified: Secondary | ICD-10-CM

## 2021-11-25 DIAGNOSIS — N183 Chronic kidney disease, stage 3 unspecified: Secondary | ICD-10-CM | POA: Diagnosis not present

## 2021-11-25 DIAGNOSIS — M150 Primary generalized (osteo)arthritis: Secondary | ICD-10-CM

## 2021-11-25 DIAGNOSIS — Z9181 History of falling: Secondary | ICD-10-CM | POA: Diagnosis not present

## 2021-11-25 DIAGNOSIS — M810 Age-related osteoporosis without current pathological fracture: Secondary | ICD-10-CM

## 2021-11-25 DIAGNOSIS — G629 Polyneuropathy, unspecified: Secondary | ICD-10-CM | POA: Diagnosis not present

## 2021-11-25 DIAGNOSIS — H9190 Unspecified hearing loss, unspecified ear: Secondary | ICD-10-CM | POA: Diagnosis not present

## 2021-11-25 DIAGNOSIS — E782 Mixed hyperlipidemia: Secondary | ICD-10-CM | POA: Diagnosis not present

## 2021-11-28 DIAGNOSIS — M7121 Synovial cyst of popliteal space [Baker], right knee: Secondary | ICD-10-CM | POA: Diagnosis not present

## 2021-11-28 DIAGNOSIS — E782 Mixed hyperlipidemia: Secondary | ICD-10-CM | POA: Diagnosis not present

## 2021-11-28 DIAGNOSIS — I129 Hypertensive chronic kidney disease with stage 1 through stage 4 chronic kidney disease, or unspecified chronic kidney disease: Secondary | ICD-10-CM | POA: Diagnosis not present

## 2021-11-28 DIAGNOSIS — I872 Venous insufficiency (chronic) (peripheral): Secondary | ICD-10-CM | POA: Diagnosis not present

## 2021-11-28 DIAGNOSIS — M810 Age-related osteoporosis without current pathological fracture: Secondary | ICD-10-CM | POA: Diagnosis not present

## 2021-11-28 DIAGNOSIS — Z881 Allergy status to other antibiotic agents status: Secondary | ICD-10-CM | POA: Diagnosis not present

## 2021-11-28 DIAGNOSIS — Z880 Allergy status to penicillin: Secondary | ICD-10-CM | POA: Diagnosis not present

## 2021-11-28 DIAGNOSIS — N183 Chronic kidney disease, stage 3 unspecified: Secondary | ICD-10-CM | POA: Diagnosis not present

## 2021-11-28 DIAGNOSIS — M4124 Other idiopathic scoliosis, thoracic region: Secondary | ICD-10-CM | POA: Diagnosis not present

## 2021-11-28 DIAGNOSIS — I739 Peripheral vascular disease, unspecified: Secondary | ICD-10-CM | POA: Diagnosis not present

## 2021-11-28 DIAGNOSIS — G629 Polyneuropathy, unspecified: Secondary | ICD-10-CM | POA: Diagnosis not present

## 2021-11-28 DIAGNOSIS — M7989 Other specified soft tissue disorders: Secondary | ICD-10-CM | POA: Diagnosis not present

## 2021-11-28 DIAGNOSIS — Z9181 History of falling: Secondary | ICD-10-CM | POA: Diagnosis not present

## 2021-11-28 DIAGNOSIS — Z882 Allergy status to sulfonamides status: Secondary | ICD-10-CM | POA: Diagnosis not present

## 2021-11-28 DIAGNOSIS — E039 Hypothyroidism, unspecified: Secondary | ICD-10-CM | POA: Diagnosis not present

## 2021-11-28 DIAGNOSIS — Z853 Personal history of malignant neoplasm of breast: Secondary | ICD-10-CM | POA: Diagnosis not present

## 2021-11-28 DIAGNOSIS — H9190 Unspecified hearing loss, unspecified ear: Secondary | ICD-10-CM | POA: Diagnosis not present

## 2021-11-28 DIAGNOSIS — M150 Primary generalized (osteo)arthritis: Secondary | ICD-10-CM | POA: Diagnosis not present

## 2021-11-29 ENCOUNTER — Telehealth: Payer: Self-pay | Admitting: Family Medicine

## 2021-11-29 NOTE — Telephone Encounter (Signed)
Appt made with DOD on 12/01/21 with Tiffany. 30 min given. Offered to schedule an appt with PCP 4/26 or next Tuesday. Daughter states that tomorrow will be too soon and next Tuesday is too far out. She states that pt just started new med and was instructed by ER to give it a couple of days and then follow up with PCP. Also, pt has other appts on 4/26. ? ?

## 2021-11-29 NOTE — Telephone Encounter (Signed)
Please call patients daughter to schedule pts ER Follow Up. ? ?Call [name] at [phone] to schedule. ?

## 2021-11-30 ENCOUNTER — Ambulatory Visit: Payer: Medicare Other | Admitting: Family Medicine

## 2021-12-01 ENCOUNTER — Ambulatory Visit (INDEPENDENT_AMBULATORY_CARE_PROVIDER_SITE_OTHER): Payer: Medicare Other | Admitting: Family Medicine

## 2021-12-01 ENCOUNTER — Encounter: Payer: Self-pay | Admitting: Family Medicine

## 2021-12-01 VITALS — BP 119/69 | HR 91 | Temp 98.3°F | Ht 60.0 in | Wt 110.0 lb

## 2021-12-01 DIAGNOSIS — L97911 Non-pressure chronic ulcer of unspecified part of right lower leg limited to breakdown of skin: Secondary | ICD-10-CM

## 2021-12-01 DIAGNOSIS — L03115 Cellulitis of right lower limb: Secondary | ICD-10-CM | POA: Diagnosis not present

## 2021-12-01 NOTE — Progress Notes (Signed)
? ?  Acute Office Visit ? ?Subjective:  ? ?  ?Patient ID: Janice Reed, female    DOB: [birth year], 88 y.o.   MRN: [id number] ? ?Chief Complaint  ?Patient presents with  ? ER follow up  ? ? ?HPI ?Patient is in today for an ED follow up. She is here with her sister. Briyonna was seen at [facility] on 11/28/21 for a venous statis ulcer on her right lower leg. She was given clindamycin for treatment of cellulitis and tramadol for pain. She had a negative [place] for DVT. She has been wearing compression stockings. She reports that the pain is much better and she has not been taking the pain medication. The redness is about the same. Ulcer has not changed in size. Swelling has improved greatly. There has not been drainage. She has been taking the antibiotic and lasix as prescribed. They would like to have her leg wrapped. They report that Alvis Lemmings has faxed paperwork to PCP for wound care and for orders to remove and reapply unna boot if needed.  ? ?ROS ?As per HPI.  ? ?   ?Objective:  ?  ?BP 119/69   Pulse 91   Temp 98.3 ?F (36.8 ?C) (Temporal)   Ht 5' (1.524 m)   Wt 110 lb (49.9 kg)   BMI 21.48 kg/m?  ?BP Readings from Last 3 Encounters:  ?12/01/21 119/69  ?10/31/21 130/78  ?02/01/21 (!) 150/81  ? ?  ? ?Physical Exam ?Vitals and nursing note reviewed.  ?Constitutional:   ?   General: She is not in acute distress. ?   Appearance: She is not ill-appearing, toxic-appearing or diaphoretic.  ?Pulmonary:  ?   Effort: Pulmonary effort is normal. No respiratory distress.  ?Musculoskeletal:  ?   Right lower leg: 2+ Pitting Edema present.  ?   Left lower leg: 1+ Edema present.  ?Skin: ?   General: Skin is warm and dry.  ?   Capillary Refill: Capillary refill takes 2 to 3 seconds.  ?   Findings: Lesion (1 cm x1 cm shallow ulcer to right lateral lower leg with surrounding erythema and tenderness) present.  ?Neurological:  ?   Mental Status: She is alert and oriented to person, place, and time.  ?Psychiatric:     ?   Mood and Affect: Mood  normal.     ?   Behavior: Behavior normal.  ? ? ?No results found for any visits on 12/01/21. ? ? ?   ?Assessment & Plan:  ? ?Janice Reed was seen today for er follow up. ? ?Diagnoses and all orders for this visit: ? ?Ulcer of right lower extremity, limited to breakdown of skin ([place]) ?Unna boot applied today. Alvis Lemmings has faxed paperwork for orders for wound care to PCP and removal of unna boot. Instructed to keep Unna boot on and for [facility] nurse remove in 5-7 days.  ? ?Cellulitis of right lower extremity ?Continue clindamycin as prescribed. Improving symptoms.  ? ?Return if symptoms worsen or fail to improve. ? ?The patient indicates understanding of these issues and agrees with the plan. ? ?Gwenlyn Perking, FNP ? ? ?

## 2021-12-01 NOTE — Patient Instructions (Signed)
Unna Boot Care An Unna boot is a type of bandage (dressing) for the foot and leg. The dressing is a gauze wrap that is soaked with a type of medicine called zinc oxide. The gauze may also include other lotions and medicines that help in wound healing, such as calamine. An Unna boot may be used to treat: Open sores (ulcers) on the foot, heel, or leg. Swelling from disorders that affect the veins or lymphatic system (lymphedema). Skin conditions such as chronic inflammation caused by poor blood flow (stasis dermatitis). The dressing is applied by a health care provider. The gauze is wrapped around your lower extremity in several layers, usually starting at the toes and going upward to the knee. A dry outer wrap goes over the medicated wrap for support and compression.  Before applying the Unna boot, your health care provider will clean your leg and foot and may apply an antibiotic ointment. You may be asked to raise (elevate) your leg for a while to reduce swelling before the boot is applied. The boot will dry and harden after it is applied. The boot may need to be changed or replaced about twice a week. Follow these instructions at home: Boot care Wear the Unna boot as told by your health care provider. You may need to wear a slipper or shoe over the boot that is one or two sizes larger than normal. Check the skin around the boot every day. Tell your health care provider about any concerns. Do not stick anything inside the boot to scratch your skin. Doing that increases your risk of infection. Keep your Unna boot clean and dry. Check every day for signs of infection. Check for: Redness, swelling, or pain in your foot or toes. Fluid or blood coming from the boot. Pus or a bad smell coming from the boot. Remove the boot and call your health care provider if you have signs of poor blood flow, such as: Your toes tingle or become numb. Your toes turn cold or turn blue or pale. Your toes are more  swollen or painful. You are unable to move your toes. Activity You may walk with the boot once it has dried. Ask your health care provider how much walking is safe for you. Avoid sitting for a long time without moving. Get up to take short walks as told by your health care provider. This is important to improve blood flow. Bathing Do not take baths, swim, or use a hot tub until your health care provider approves. Ask your health care provider if you may take showers. If your health care provider approves a bath or a shower, do not let the Unna boot get wet. If you take a shower, cover the boot with a watertight covering. If you take a bath, keep your leg with the boot out of the tub. General instructions Keep your leg elevated above the level of your heart while you are sitting or lying down. This will decrease swelling. Do not sit with your knee bent for long periods of time. Take over-the-counter and prescription medicines only as told by your health care provider. Do not use any products that contain nicotine or tobacco, such as cigarettes, e-cigarettes, and chewing tobacco. These can delay healing. If you need help quitting, ask your health care provider. Keep all follow-up visits as told by your health care provider. This is important. Contact a health care provider if: Your skin feels itchy inside the boot. You have a burning sensation, a   rash, or itchy, red, swollen areas of skin (hives) in the boot area. You have a fever or chills. You have any signs of infection, such as: New redness, swelling, or pain. More fluid or blood coming from the boot. Pus or a bad smell coming from the boot. You have increased numbness or pain in your foot or toes. You have any changes in skin color on your foot or toes, such as the skin turning blue or pale or developing patchy areas with spots. Your boot has been damaged or feels like it is no longer fitting properly. Summary An Unna boot is a type of  bandage (dressing) system for the foot and leg. The dressing is a gauze wrap that is soaked with a type of medicine (zinc oxide) to treat foot, heel, or leg ulcers, swelling from disorders that affect the veins or lymphatic system (lymphedema), and skin conditions caused by poor blood flow (stasis dermatitis). This dressing is applied by a health care provider. After it is applied, the boot will dry and harden. The boot may need to be changed or replaced about twice a week. Let your health care provider know if you have any signs of poor blood flow or infection. This information is not intended to replace advice given to you by your health care provider. Make sure you discuss any questions you have with your health care provider. Document Revised: 05/19/2021 Document Reviewed: 05/19/2021 Elsevier Patient Education  2023 Elsevier Inc.  

## 2021-12-02 ENCOUNTER — Telehealth: Payer: Medicare Other

## 2021-12-02 DIAGNOSIS — N183 Chronic kidney disease, stage 3 unspecified: Secondary | ICD-10-CM | POA: Diagnosis not present

## 2021-12-02 DIAGNOSIS — M150 Primary generalized (osteo)arthritis: Secondary | ICD-10-CM | POA: Diagnosis not present

## 2021-12-02 DIAGNOSIS — Z9181 History of falling: Secondary | ICD-10-CM | POA: Diagnosis not present

## 2021-12-02 DIAGNOSIS — M4124 Other idiopathic scoliosis, thoracic region: Secondary | ICD-10-CM | POA: Diagnosis not present

## 2021-12-02 DIAGNOSIS — M810 Age-related osteoporosis without current pathological fracture: Secondary | ICD-10-CM | POA: Diagnosis not present

## 2021-12-02 DIAGNOSIS — E039 Hypothyroidism, unspecified: Secondary | ICD-10-CM | POA: Diagnosis not present

## 2021-12-02 DIAGNOSIS — I129 Hypertensive chronic kidney disease with stage 1 through stage 4 chronic kidney disease, or unspecified chronic kidney disease: Secondary | ICD-10-CM | POA: Diagnosis not present

## 2021-12-02 DIAGNOSIS — E782 Mixed hyperlipidemia: Secondary | ICD-10-CM | POA: Diagnosis not present

## 2021-12-02 DIAGNOSIS — G629 Polyneuropathy, unspecified: Secondary | ICD-10-CM | POA: Diagnosis not present

## 2021-12-02 DIAGNOSIS — H9190 Unspecified hearing loss, unspecified ear: Secondary | ICD-10-CM | POA: Diagnosis not present

## 2021-12-02 DIAGNOSIS — Z853 Personal history of malignant neoplasm of breast: Secondary | ICD-10-CM | POA: Diagnosis not present

## 2021-12-05 DIAGNOSIS — M810 Age-related osteoporosis without current pathological fracture: Secondary | ICD-10-CM | POA: Diagnosis not present

## 2021-12-05 DIAGNOSIS — M4124 Other idiopathic scoliosis, thoracic region: Secondary | ICD-10-CM | POA: Diagnosis not present

## 2021-12-05 DIAGNOSIS — H9190 Unspecified hearing loss, unspecified ear: Secondary | ICD-10-CM | POA: Diagnosis not present

## 2021-12-05 DIAGNOSIS — I129 Hypertensive chronic kidney disease with stage 1 through stage 4 chronic kidney disease, or unspecified chronic kidney disease: Secondary | ICD-10-CM | POA: Diagnosis not present

## 2021-12-05 DIAGNOSIS — G629 Polyneuropathy, unspecified: Secondary | ICD-10-CM | POA: Diagnosis not present

## 2021-12-05 DIAGNOSIS — N183 Chronic kidney disease, stage 3 unspecified: Secondary | ICD-10-CM | POA: Diagnosis not present

## 2021-12-05 DIAGNOSIS — M150 Primary generalized (osteo)arthritis: Secondary | ICD-10-CM | POA: Diagnosis not present

## 2021-12-05 DIAGNOSIS — E039 Hypothyroidism, unspecified: Secondary | ICD-10-CM | POA: Diagnosis not present

## 2021-12-05 DIAGNOSIS — E782 Mixed hyperlipidemia: Secondary | ICD-10-CM | POA: Diagnosis not present

## 2021-12-05 DIAGNOSIS — Z853 Personal history of malignant neoplasm of breast: Secondary | ICD-10-CM | POA: Diagnosis not present

## 2021-12-05 DIAGNOSIS — Z9181 History of falling: Secondary | ICD-10-CM | POA: Diagnosis not present

## 2021-12-07 DIAGNOSIS — Z9181 History of falling: Secondary | ICD-10-CM | POA: Diagnosis not present

## 2021-12-07 DIAGNOSIS — M810 Age-related osteoporosis without current pathological fracture: Secondary | ICD-10-CM | POA: Diagnosis not present

## 2021-12-07 DIAGNOSIS — Z853 Personal history of malignant neoplasm of breast: Secondary | ICD-10-CM | POA: Diagnosis not present

## 2021-12-07 DIAGNOSIS — M150 Primary generalized (osteo)arthritis: Secondary | ICD-10-CM | POA: Diagnosis not present

## 2021-12-07 DIAGNOSIS — H9190 Unspecified hearing loss, unspecified ear: Secondary | ICD-10-CM | POA: Diagnosis not present

## 2021-12-07 DIAGNOSIS — M4124 Other idiopathic scoliosis, thoracic region: Secondary | ICD-10-CM | POA: Diagnosis not present

## 2021-12-07 DIAGNOSIS — I129 Hypertensive chronic kidney disease with stage 1 through stage 4 chronic kidney disease, or unspecified chronic kidney disease: Secondary | ICD-10-CM | POA: Diagnosis not present

## 2021-12-07 DIAGNOSIS — E782 Mixed hyperlipidemia: Secondary | ICD-10-CM | POA: Diagnosis not present

## 2021-12-07 DIAGNOSIS — G629 Polyneuropathy, unspecified: Secondary | ICD-10-CM | POA: Diagnosis not present

## 2021-12-07 DIAGNOSIS — E039 Hypothyroidism, unspecified: Secondary | ICD-10-CM | POA: Diagnosis not present

## 2021-12-07 DIAGNOSIS — N183 Chronic kidney disease, stage 3 unspecified: Secondary | ICD-10-CM | POA: Diagnosis not present

## 2021-12-09 DIAGNOSIS — Z9181 History of falling: Secondary | ICD-10-CM | POA: Diagnosis not present

## 2021-12-09 DIAGNOSIS — G629 Polyneuropathy, unspecified: Secondary | ICD-10-CM | POA: Diagnosis not present

## 2021-12-09 DIAGNOSIS — M810 Age-related osteoporosis without current pathological fracture: Secondary | ICD-10-CM | POA: Diagnosis not present

## 2021-12-09 DIAGNOSIS — M4124 Other idiopathic scoliosis, thoracic region: Secondary | ICD-10-CM | POA: Diagnosis not present

## 2021-12-09 DIAGNOSIS — E039 Hypothyroidism, unspecified: Secondary | ICD-10-CM | POA: Diagnosis not present

## 2021-12-09 DIAGNOSIS — Z853 Personal history of malignant neoplasm of breast: Secondary | ICD-10-CM | POA: Diagnosis not present

## 2021-12-09 DIAGNOSIS — I129 Hypertensive chronic kidney disease with stage 1 through stage 4 chronic kidney disease, or unspecified chronic kidney disease: Secondary | ICD-10-CM | POA: Diagnosis not present

## 2021-12-09 DIAGNOSIS — N183 Chronic kidney disease, stage 3 unspecified: Secondary | ICD-10-CM | POA: Diagnosis not present

## 2021-12-09 DIAGNOSIS — H9190 Unspecified hearing loss, unspecified ear: Secondary | ICD-10-CM | POA: Diagnosis not present

## 2021-12-09 DIAGNOSIS — E782 Mixed hyperlipidemia: Secondary | ICD-10-CM | POA: Diagnosis not present

## 2021-12-09 DIAGNOSIS — M150 Primary generalized (osteo)arthritis: Secondary | ICD-10-CM | POA: Diagnosis not present

## 2021-12-12 ENCOUNTER — Telehealth: Payer: Medicare Other

## 2021-12-12 DIAGNOSIS — E782 Mixed hyperlipidemia: Secondary | ICD-10-CM | POA: Diagnosis not present

## 2021-12-12 DIAGNOSIS — G629 Polyneuropathy, unspecified: Secondary | ICD-10-CM | POA: Diagnosis not present

## 2021-12-12 DIAGNOSIS — H9190 Unspecified hearing loss, unspecified ear: Secondary | ICD-10-CM | POA: Diagnosis not present

## 2021-12-12 DIAGNOSIS — N183 Chronic kidney disease, stage 3 unspecified: Secondary | ICD-10-CM | POA: Diagnosis not present

## 2021-12-12 DIAGNOSIS — E039 Hypothyroidism, unspecified: Secondary | ICD-10-CM | POA: Diagnosis not present

## 2021-12-12 DIAGNOSIS — I129 Hypertensive chronic kidney disease with stage 1 through stage 4 chronic kidney disease, or unspecified chronic kidney disease: Secondary | ICD-10-CM | POA: Diagnosis not present

## 2021-12-12 DIAGNOSIS — M150 Primary generalized (osteo)arthritis: Secondary | ICD-10-CM | POA: Diagnosis not present

## 2021-12-12 DIAGNOSIS — Z853 Personal history of malignant neoplasm of breast: Secondary | ICD-10-CM | POA: Diagnosis not present

## 2021-12-12 DIAGNOSIS — M810 Age-related osteoporosis without current pathological fracture: Secondary | ICD-10-CM | POA: Diagnosis not present

## 2021-12-12 DIAGNOSIS — M4124 Other idiopathic scoliosis, thoracic region: Secondary | ICD-10-CM | POA: Diagnosis not present

## 2021-12-12 DIAGNOSIS — Z9181 History of falling: Secondary | ICD-10-CM | POA: Diagnosis not present

## 2021-12-13 NOTE — Patient Instructions (Signed)
Janice Reed , ?Thank you for taking time to come for your Medicare Wellness Visit. I appreciate your ongoing commitment to your health goals. Please review the following plan we discussed and let me know if I can assist you in the future.  ? ?Screening recommendations/referrals: ?Colonoscopy: No longer required. ?Mammogram: No longer required. ?Bone Density: No longer required. Done 11/09/2016. ? ?Recommended yearly ophthalmology/optometry visit for glaucoma screening and checkup ?Recommended yearly dental visit for hygiene and checkup ? ?Vaccinations: ?Influenza vaccine: Done 06/08/2021 Repeat annually ? ?Pneumococcal vaccine: Done 08/07/1998. 06/04/2013 ?Tdap vaccine: Done 02/05/2007 Repeat in 10 years ? ?Shingles vaccine: Discussed.   ?Covid-19:Done 09/12/2019, 10/11/2019, 06/15/2020 and 05/10/2021 ? ?Advanced directives: Please bring a copy of your health care power of attorney and living will to the office to be added to your chart at your convenience. ? ? ?Conditions/risks identified: KEEP UP THE GOOD WORK!!  ? ?Next appointment: Follow up in one year for your annual wellness visit 2024. ? ? ?Preventive Care 86 Years and Older, Female ?Preventive care refers to lifestyle choices and visits with your health care provider that can promote health and wellness. ?What does preventive care include? ?A yearly physical exam. This is also called an annual well check. ?Dental exams once or twice a year. ?Routine eye exams. Ask your health care provider how often you should have your eyes checked. ?Personal lifestyle choices, including: ?Daily care of your teeth and gums. ?Regular physical activity. ?Eating a healthy diet. ?Avoiding tobacco and drug use. ?Limiting alcohol use. ?Practicing safe sex. ?Taking low-dose aspirin every day. ?Taking vitamin and mineral supplements as recommended by your health care provider. ?What happens during an annual well check? ?The services and screenings done by your health care provider during your  annual well check will depend on your age, overall health, lifestyle risk factors, and family history of disease. ?Counseling  ?Your health care provider may ask you questions about your: ?Alcohol use. ?Tobacco use. ?Drug use. ?Emotional well-being. ?Home and relationship well-being. ?Sexual activity. ?Eating habits. ?History of falls. ?Memory and ability to understand (cognition). ?Work and work Statistician. ?Reproductive health. ?Screening  ?You may have the following tests or measurements: ?Height, weight, and BMI. ?Blood pressure. ?Lipid and cholesterol levels. These may be checked every 5 years, or more frequently if you are over 3 years old. ?Skin check. ?Lung cancer screening. You may have this screening every year starting at age 30 if you have a 30-pack-year history of smoking and currently smoke or have quit within the past 15 years. ?Fecal occult blood test (FOBT) of the stool. You may have this test every year starting at age 70. ?Flexible sigmoidoscopy or colonoscopy. You may have a sigmoidoscopy every 5 years or a colonoscopy every 10 years starting at age 75. ?Hepatitis C blood test. ?Hepatitis B blood test. ?Sexually transmitted disease (STD) testing. ?Diabetes screening. This is done by checking your blood sugar (glucose) after you have not eaten for a while (fasting). You may have this done every 1-3 years. ?Bone density scan. This is done to screen for osteoporosis. You may have this done starting at age 50. ?Mammogram. This may be done every 1-2 years. Talk to your health care provider about how often you should have regular mammograms. ?Talk with your health care provider about your test results, treatment options, and if necessary, the need for more tests. ?Vaccines  ?Your health care provider may recommend certain vaccines, such as: ?Influenza vaccine. This is recommended every year. ?Tetanus, diphtheria, and acellular  pertussis (Tdap, Td) vaccine. You may need a Td booster every 10  years. ?Zoster vaccine. You may need this after age 4. ?Pneumococcal 13-valent conjugate (PCV13) vaccine. One dose is recommended after age 41. ?Pneumococcal polysaccharide (PPSV23) vaccine. One dose is recommended after age 16. ?Talk to your health care provider about which screenings and vaccines you need and how often you need them. ?This information is not intended to replace advice given to you by your health care provider. Make sure you discuss any questions you have with your health care provider. ?Document Released: 08/20/2015 Document Revised: 04/12/2016 Document Reviewed: 05/25/2015 ?Elsevier Interactive Patient Education ? 2017 [employer]. ? ?Fall Prevention in the Home ?Falls can cause injuries. They can happen to people of all ages. There are many things you can do to make your home safe and to help prevent falls. ?What can I do on the outside of my home? ?Regularly fix the edges of walkways and driveways and fix any cracks. ?Remove anything that might make you trip as you walk through a door, such as a raised step or threshold. ?Trim any bushes or trees on the path to your home. ?Use bright outdoor lighting. ?Clear any walking paths of anything that might make someone trip, such as rocks or tools. ?Regularly check to see if handrails are loose or broken. Make sure that both sides of any steps have handrails. ?Any raised decks and porches should have guardrails on the edges. ?Have any leaves, snow, or ice cleared regularly. ?Use sand or salt on walking paths during winter. ?Clean up any spills in your garage right away. This includes oil or grease spills. ?What can I do in the bathroom? ?Use night lights. ?Install grab bars by the toilet and in the tub and shower. Do not use towel bars as grab bars. ?Use non-skid mats or decals in the tub or shower. ?If you need to sit down in the shower, use a plastic, non-slip stool. ?Keep the floor dry. Clean up any water that spills on the floor as soon as it  happens. ?Remove soap buildup in the tub or shower regularly. ?Attach bath mats securely with double-sided non-slip rug tape. ?Do not have throw rugs and other things on the floor that can make you trip. ?What can I do in the bedroom? ?Use night lights. ?Make sure that you have a light by your bed that is easy to reach. ?Do not use any sheets or blankets that are too big for your bed. They should not hang down onto the floor. ?Have a firm chair that has side arms. You can use this for support while you get dressed. ?Do not have throw rugs and other things on the floor that can make you trip. ?What can I do in the kitchen? ?Clean up any spills right away. ?Avoid walking on wet floors. ?Keep items that you use a lot in easy-to-reach places. ?If you need to reach something above you, use a strong step stool that has a grab bar. ?Keep electrical cords out of the way. ?Do not use floor polish or wax that makes floors slippery. If you must use wax, use non-skid floor wax. ?Do not have throw rugs and other things on the floor that can make you trip. ?What can I do with my stairs? ?Do not leave any items on the stairs. ?Make sure that there are handrails on both sides of the stairs and use them. Fix handrails that are broken or loose. Make sure that handrails  are as long as the stairways. ?Check any carpeting to make sure that it is firmly attached to the stairs. Fix any carpet that is loose or worn. ?Avoid having throw rugs at the top or bottom of the stairs. If you do have throw rugs, attach them to the floor with carpet tape. ?Make sure that you have a light switch at the top of the stairs and the bottom of the stairs. If you do not have them, ask someone to add them for you. ?What else can I do to help prevent falls? ?Wear shoes that: ?Do not have high heels. ?Have rubber bottoms. ?Are comfortable and fit you well. ?Are closed at the toe. Do not wear sandals. ?If you use a stepladder: ?Make sure that it is fully opened.  Do not climb a closed stepladder. ?Make sure that both sides of the stepladder are locked into place. ?Ask someone to hold it for you, if possible. ?Clearly mark and make sure that you can see: ?Any grab bars or

## 2021-12-14 ENCOUNTER — Ambulatory Visit (INDEPENDENT_AMBULATORY_CARE_PROVIDER_SITE_OTHER): Payer: Medicare Other

## 2021-12-14 VITALS — Ht 60.0 in | Wt 110.0 lb

## 2021-12-14 DIAGNOSIS — Z0000 Encounter for general adult medical examination without abnormal findings: Secondary | ICD-10-CM

## 2021-12-14 DIAGNOSIS — G629 Polyneuropathy, unspecified: Secondary | ICD-10-CM | POA: Diagnosis not present

## 2021-12-14 DIAGNOSIS — M810 Age-related osteoporosis without current pathological fracture: Secondary | ICD-10-CM | POA: Diagnosis not present

## 2021-12-14 DIAGNOSIS — Z780 Asymptomatic menopausal state: Secondary | ICD-10-CM

## 2021-12-14 DIAGNOSIS — M150 Primary generalized (osteo)arthritis: Secondary | ICD-10-CM | POA: Diagnosis not present

## 2021-12-14 DIAGNOSIS — E782 Mixed hyperlipidemia: Secondary | ICD-10-CM | POA: Diagnosis not present

## 2021-12-14 DIAGNOSIS — N183 Chronic kidney disease, stage 3 unspecified: Secondary | ICD-10-CM | POA: Diagnosis not present

## 2021-12-14 DIAGNOSIS — Z853 Personal history of malignant neoplasm of breast: Secondary | ICD-10-CM | POA: Diagnosis not present

## 2021-12-14 DIAGNOSIS — E039 Hypothyroidism, unspecified: Secondary | ICD-10-CM | POA: Diagnosis not present

## 2021-12-14 DIAGNOSIS — Z9181 History of falling: Secondary | ICD-10-CM | POA: Diagnosis not present

## 2021-12-14 DIAGNOSIS — H9190 Unspecified hearing loss, unspecified ear: Secondary | ICD-10-CM | POA: Diagnosis not present

## 2021-12-14 DIAGNOSIS — M4124 Other idiopathic scoliosis, thoracic region: Secondary | ICD-10-CM | POA: Diagnosis not present

## 2021-12-14 DIAGNOSIS — I129 Hypertensive chronic kidney disease with stage 1 through stage 4 chronic kidney disease, or unspecified chronic kidney disease: Secondary | ICD-10-CM | POA: Diagnosis not present

## 2021-12-14 NOTE — Progress Notes (Signed)
? ?Subjective:  ? Janice Reed is a 88 y.o. female who presents for Medicare Annual (Subsequent) preventive examination. ?Virtual Visit via Telephone Note ? ?I connected with  Janice Reed on 12/14/21 at 11:15 AM EDT by telephone and verified that I am speaking with the correct person using two identifiers. ? ?Location: ?Patient: HOME ?Provider: [facility] ?Persons participating in the virtual visit: patient/Nurse Health Advisor ?  ?I discussed the limitations, risks, security and privacy concerns of performing an evaluation and management service by telephone and the availability of in person appointments. The patient expressed understanding and agreed to proceed. ? ?Interactive audio and video telecommunications were attempted between this nurse and patient, however failed, due to patient having technical difficulties OR patient did not have access to video capability.  We continued and completed visit with audio only. ? ?Some vital signs may be absent or patient reported.  ? ?Chriss Driver, LPN ? ?Review of Systems    ? ?Cardiac Risk Factors include: advanced age (>54mn, >>51women);hypertension;dyslipidemia;sedentary lifestyle ? ?   ?Objective:  ?  ?Today's Vitals  ? 12/14/21 1114  ?Weight: 110 lb (49.9 kg)  ?Height: 5' (1.524 m)  ? ?Body mass index is 21.48 kg/m?. ? ? ?  12/14/2021  ? 11:22 AM 12/13/2020  ? 12:58 PM 12/11/2019  ?  2:04 PM 12/10/2018  ?  2:50 PM 12/03/2017  ?  2:37 PM 12/05/2016  ? 10:46 AM 11/29/2016  ?  2:33 PM  ?Advanced Directives  ?Does Patient Have a Medical Advance Directive? Yes Yes No Yes Yes Yes Yes  ?Type of AParamedicof ABonitaLiving will [place]oyd[place]Living will  HPlainsboro CenterLiving will [place]Living will [place]Living will [place]heat[place]Living will  ?Does patient want to make changes to medical advance directive?    No - Guardian declined No - Patient declined    ?Copy of  [place] Chart? No - copy requested No - copy requested  No - copy requested No - copy requested  No - copy requested  ?Would patient like information on creating a medical advance directive? No - Patient declined  No - Patient declined      ? ? ?Current Medications (verified) ?Outpatient Encounter Medications as of 12/14/2021  ?Medication Sig  ? acetaminophen (TYLENOL) 650 MG CR tablet Take 650 mg by mouth every 8 (eight) hours as needed for pain.  ? aspirin 325 MG tablet Take 325 mg by mouth See admin instructions. Take one tablet a day and more if needed for pain - up to 3 times daily  ? bevacizumab (AVASTIN) 1.25 mg/0.1 mL SOLN 1.25 mg by Intravitreal route.   ? Calcium-Magnesium-Vitamin D (CITRACAL CALCIUM+D PO) Take 2 capsules by mouth 2 (two) times daily.  ? Cholecalciferol (VITAMIN D3) 1000 UNITS CAPS Take 1,000 Units by mouth 2 (two) times daily.   ? diclofenac Sodium (VOLTAREN) 1 % GEL Apply 2 g topically 4 (four) times daily. (for arthritis). if not covered, please call  ? levothyroxine (SYNTHROID) 112 MCG tablet Take 1 tablet (112 mcg total) by mouth daily.  ? mometasone (ELOCON) 0.1 % cream Apply thin layer to dry, itchy patches on back ONCE daily x7-10days.  ? Multiple Vitamins-Minerals (PRESERVISION AREDS PO) Take by mouth.  ? NONFORMULARY OR COMPOUNDED ITEM Mix triamcinolone cream 0.1% in ceravae cream (1:3) # 464 grams (1 large jar): Apply to affected areas of legs daily.  ?  Omega-3 Fatty Acids (FISH OIL) 1000 MG CAPS Take 1,000 mg by mouth 2 (two) times daily.  ? risedronate (ACTONEL) 35 MG tablet TAKE 1 TABLET BY MOUTH  WEEKLY WITH 8 OUNCE OF  PLAIN WATER 1/2 HOUR BEFORE FIRST FOOD DRINK OR MEDS.  STAY UPRIGHT FOR 1/2 HOUR (Patient taking differently: TAKE 1 TABLET BY MOUTH  WEEKLY WITH 8 OUNCE OF  PLAIN WATER 1/2 HOUR BEFORE FIRST FOOD DRINK OR MEDS.  STAY UPRIGHT FOR 1/2 HOUR)  ? triamterene-hydrochlorothiazide (MAXZIDE-25) 37.5-25 MG tablet Take 0.5 tablets by mouth daily.   ? furosemide (LASIX) 20 MG tablet Take by mouth.  ? ?No facility-administered encounter medications on file as of 12/14/2021.  ? ? ?Allergies (verified) ?Ciprofloxacin, Benicar [olmesartan medoxomil], Clarithromycin, Sulfa antibiotics, Alendronate sodium, Celebrex [celecoxib], and Penicillins  ? ?History: ?Past Medical History:  ?Diagnosis Date  ? Arthritis   ? Arthritis pain   ? Benign hypertensive heart disease   ? Breast cancer ([place])   ? Cancer of right breast ([place]) 09/28/2011  ? IDC;  T2, N0 ([facility]+);   ER+;  Her-2 neg.,  Right PM,SLN  09/08/08   ? Collagenous colitis   ? Diverticulosis of colon   ? External hemorrhoid   ? Hearing loss   ? Hypertension   ? IBS (irritable bowel syndrome)   ? Macular degeneration   ? Macular degeneration of both eyes   ? Neuropathy, peripheral   ? Nonspecific abnormal electrocardiogram (ECG) (EKG)   ? Osteoporosis   ? Other and unspecified hyperlipidemia   ? Palpitations   ? Phlebitis and thrombophlebitis of unspecified site   ? Prolapse of vaginal walls without mention of uterine prolapse   ? Shingles   ? Symptomatic menopausal or female climacteric states   ? Unspecified hypothyroidism   ? ?Past Surgical History:  ?Procedure Laterality Date  ? BACK SURGERY    ? BREAST LUMPECTOMY    ? per medical history form dated 09/27/09.  ? CARPAL TUNNEL RELEASE    ? bilateral  ? COLONOSCOPY  12/03/2002  ? Dr. Silvano Rusk  ? NEUROPLASTY / TRANSPOSITION MEDIAN NERVE AT CARPAL TUNNEL BILATERAL    ? TONSILLECTOMY AND ADENOIDECTOMY    ? VAGINAL HYSTERECTOMY    ? ?Family History  ?Problem Relation Age of Onset  ? Heart disease Mother   ?     Heart failure per medical history form dated 09/27/09.  ? Heart disease Father   ?     Heart attack per medical history form dated 09/27/09.  ? Heart attack Father   ? Heart disease Brother   ?     Heart failure per medical history form dated 09/27/09.  ? Stroke Brother   ? Stroke Brother   ? Leukemia Brother   ? Breast cancer Cousin   ? Healthy Daughter   ? Healthy  Son   ? Colon cancer Neg Hx   ? ?Social History  ? ?Socioeconomic History  ? Marital status: Widowed  ?  Spouse name: Not on file  ? Number of children: 2  ? Years of education: 67  ? Highest education level: 12th grade  ?Occupational History  ? Occupation: retired-homemaker  ?Tobacco Use  ? Smoking status: Never  ? Smokeless tobacco: Never  ?Vaping Use  ? Vaping Use: Never used  ?Substance and Sexual Activity  ? Alcohol use: No  ? Drug use: No  ? Sexual activity: Not Currently  ?Other Topics Concern  ? Not on file  ?Social History Narrative  ?  Widowed, 1 son one daughter, retired and homemaker. No alcohol or caffeine or tobacco.  ? She lives alone; Her daughter lives next door  ? Daughter, [name] is [facility] [employer] POA  ? She has a Actuary, Mariann Laster 5 hours a day, 5 days per week.  ? ?Social Determinants of Health  ? ?Financial Resource Strain: Low Risk   ? Difficulty of Paying Living Expenses: Not hard at all  ?Food Insecurity: No Food Insecurity  ? Worried About Charity fundraiser in the Last Year: Never true  ? Ran Out of Food in the Last Year: Never true  ?Transportation Needs: No Transportation Needs  ? Lack of Transportation (Medical): No  ? Lack of Transportation (Non-Medical): No  ?Physical Activity: Insufficiently Active  ? Days of Exercise per Week: 2 days  ? Minutes of Exercise per Session: 60 min  ?Stress: No Stress Concern Present  ? Feeling of Stress : Not at all  ?Social Connections: Moderately Isolated  ? Frequency of Communication with Friends and Family: More than three times a week  ? Frequency of Social Gatherings with Friends and Family: More than three times a week  ? Attends Religious Services: 1 to 4 times per year  ? Active Member of Clubs or Organizations: No  ? Attends Archivist Meetings: Never  ? Marital Status: Widowed  ? ? ?Tobacco Counseling ?Counseling given: Not Answered ? ? ?Clinical Intake: ? ?Pre-visit preparation completed: Yes ? ?Pain : No/denies pain ? ?  ? ?BMI - recorded:  21.48 ?Nutritional Status: BMI of 19-24  Normal ?Nutritional Risks: None ?Diabetes: No ? ?How often do you need to have someone help you when you read instructions, pamphlets, or other written materials

## 2021-12-15 DIAGNOSIS — M4124 Other idiopathic scoliosis, thoracic region: Secondary | ICD-10-CM | POA: Diagnosis not present

## 2021-12-15 DIAGNOSIS — N183 Chronic kidney disease, stage 3 unspecified: Secondary | ICD-10-CM | POA: Diagnosis not present

## 2021-12-15 DIAGNOSIS — M810 Age-related osteoporosis without current pathological fracture: Secondary | ICD-10-CM | POA: Diagnosis not present

## 2021-12-15 DIAGNOSIS — E039 Hypothyroidism, unspecified: Secondary | ICD-10-CM | POA: Diagnosis not present

## 2021-12-15 DIAGNOSIS — Z853 Personal history of malignant neoplasm of breast: Secondary | ICD-10-CM | POA: Diagnosis not present

## 2021-12-15 DIAGNOSIS — Z9181 History of falling: Secondary | ICD-10-CM | POA: Diagnosis not present

## 2021-12-15 DIAGNOSIS — I129 Hypertensive chronic kidney disease with stage 1 through stage 4 chronic kidney disease, or unspecified chronic kidney disease: Secondary | ICD-10-CM | POA: Diagnosis not present

## 2021-12-15 DIAGNOSIS — E782 Mixed hyperlipidemia: Secondary | ICD-10-CM | POA: Diagnosis not present

## 2021-12-15 DIAGNOSIS — G629 Polyneuropathy, unspecified: Secondary | ICD-10-CM | POA: Diagnosis not present

## 2021-12-15 DIAGNOSIS — H9190 Unspecified hearing loss, unspecified ear: Secondary | ICD-10-CM | POA: Diagnosis not present

## 2021-12-15 DIAGNOSIS — M150 Primary generalized (osteo)arthritis: Secondary | ICD-10-CM | POA: Diagnosis not present

## 2021-12-16 DIAGNOSIS — N183 Chronic kidney disease, stage 3 unspecified: Secondary | ICD-10-CM | POA: Diagnosis not present

## 2021-12-16 DIAGNOSIS — M810 Age-related osteoporosis without current pathological fracture: Secondary | ICD-10-CM | POA: Diagnosis not present

## 2021-12-16 DIAGNOSIS — M4124 Other idiopathic scoliosis, thoracic region: Secondary | ICD-10-CM | POA: Diagnosis not present

## 2021-12-16 DIAGNOSIS — M150 Primary generalized (osteo)arthritis: Secondary | ICD-10-CM | POA: Diagnosis not present

## 2021-12-16 DIAGNOSIS — Z9181 History of falling: Secondary | ICD-10-CM | POA: Diagnosis not present

## 2021-12-16 DIAGNOSIS — H9190 Unspecified hearing loss, unspecified ear: Secondary | ICD-10-CM | POA: Diagnosis not present

## 2021-12-16 DIAGNOSIS — Z853 Personal history of malignant neoplasm of breast: Secondary | ICD-10-CM | POA: Diagnosis not present

## 2021-12-16 DIAGNOSIS — E782 Mixed hyperlipidemia: Secondary | ICD-10-CM | POA: Diagnosis not present

## 2021-12-16 DIAGNOSIS — E039 Hypothyroidism, unspecified: Secondary | ICD-10-CM | POA: Diagnosis not present

## 2021-12-16 DIAGNOSIS — G629 Polyneuropathy, unspecified: Secondary | ICD-10-CM | POA: Diagnosis not present

## 2021-12-16 DIAGNOSIS — I129 Hypertensive chronic kidney disease with stage 1 through stage 4 chronic kidney disease, or unspecified chronic kidney disease: Secondary | ICD-10-CM | POA: Diagnosis not present

## 2021-12-19 DIAGNOSIS — E039 Hypothyroidism, unspecified: Secondary | ICD-10-CM | POA: Diagnosis not present

## 2021-12-19 DIAGNOSIS — G629 Polyneuropathy, unspecified: Secondary | ICD-10-CM | POA: Diagnosis not present

## 2021-12-19 DIAGNOSIS — I129 Hypertensive chronic kidney disease with stage 1 through stage 4 chronic kidney disease, or unspecified chronic kidney disease: Secondary | ICD-10-CM | POA: Diagnosis not present

## 2021-12-19 DIAGNOSIS — M150 Primary generalized (osteo)arthritis: Secondary | ICD-10-CM | POA: Diagnosis not present

## 2021-12-19 DIAGNOSIS — N183 Chronic kidney disease, stage 3 unspecified: Secondary | ICD-10-CM | POA: Diagnosis not present

## 2021-12-19 DIAGNOSIS — Z853 Personal history of malignant neoplasm of breast: Secondary | ICD-10-CM | POA: Diagnosis not present

## 2021-12-19 DIAGNOSIS — H9190 Unspecified hearing loss, unspecified ear: Secondary | ICD-10-CM | POA: Diagnosis not present

## 2021-12-19 DIAGNOSIS — E782 Mixed hyperlipidemia: Secondary | ICD-10-CM | POA: Diagnosis not present

## 2021-12-19 DIAGNOSIS — M810 Age-related osteoporosis without current pathological fracture: Secondary | ICD-10-CM | POA: Diagnosis not present

## 2021-12-19 DIAGNOSIS — M4124 Other idiopathic scoliosis, thoracic region: Secondary | ICD-10-CM | POA: Diagnosis not present

## 2021-12-19 DIAGNOSIS — Z9181 History of falling: Secondary | ICD-10-CM | POA: Diagnosis not present

## 2021-12-20 DIAGNOSIS — N183 Chronic kidney disease, stage 3 unspecified: Secondary | ICD-10-CM | POA: Diagnosis not present

## 2021-12-20 DIAGNOSIS — I129 Hypertensive chronic kidney disease with stage 1 through stage 4 chronic kidney disease, or unspecified chronic kidney disease: Secondary | ICD-10-CM | POA: Diagnosis not present

## 2021-12-20 DIAGNOSIS — M150 Primary generalized (osteo)arthritis: Secondary | ICD-10-CM | POA: Diagnosis not present

## 2021-12-20 DIAGNOSIS — H9190 Unspecified hearing loss, unspecified ear: Secondary | ICD-10-CM | POA: Diagnosis not present

## 2021-12-20 DIAGNOSIS — M810 Age-related osteoporosis without current pathological fracture: Secondary | ICD-10-CM | POA: Diagnosis not present

## 2021-12-20 DIAGNOSIS — Z853 Personal history of malignant neoplasm of breast: Secondary | ICD-10-CM | POA: Diagnosis not present

## 2021-12-20 DIAGNOSIS — Z9181 History of falling: Secondary | ICD-10-CM | POA: Diagnosis not present

## 2021-12-20 DIAGNOSIS — G629 Polyneuropathy, unspecified: Secondary | ICD-10-CM | POA: Diagnosis not present

## 2021-12-20 DIAGNOSIS — M4124 Other idiopathic scoliosis, thoracic region: Secondary | ICD-10-CM | POA: Diagnosis not present

## 2021-12-20 DIAGNOSIS — E039 Hypothyroidism, unspecified: Secondary | ICD-10-CM | POA: Diagnosis not present

## 2021-12-20 DIAGNOSIS — E782 Mixed hyperlipidemia: Secondary | ICD-10-CM | POA: Diagnosis not present

## 2021-12-22 ENCOUNTER — Other Ambulatory Visit: Payer: Medicare Other

## 2021-12-22 ENCOUNTER — Telehealth: Payer: Self-pay | Admitting: Family Medicine

## 2021-12-22 DIAGNOSIS — E039 Hypothyroidism, unspecified: Secondary | ICD-10-CM

## 2021-12-22 NOTE — Telephone Encounter (Signed)
Pt aware we did not have any notes as to why we called let pt know if something did come up and we needed something we will rech back out

## 2021-12-23 ENCOUNTER — Telehealth: Payer: Self-pay | Admitting: Family Medicine

## 2021-12-23 DIAGNOSIS — E039 Hypothyroidism, unspecified: Secondary | ICD-10-CM | POA: Diagnosis not present

## 2021-12-23 DIAGNOSIS — G629 Polyneuropathy, unspecified: Secondary | ICD-10-CM | POA: Diagnosis not present

## 2021-12-23 DIAGNOSIS — H9190 Unspecified hearing loss, unspecified ear: Secondary | ICD-10-CM | POA: Diagnosis not present

## 2021-12-23 DIAGNOSIS — M4124 Other idiopathic scoliosis, thoracic region: Secondary | ICD-10-CM | POA: Diagnosis not present

## 2021-12-23 DIAGNOSIS — M810 Age-related osteoporosis without current pathological fracture: Secondary | ICD-10-CM | POA: Diagnosis not present

## 2021-12-23 DIAGNOSIS — N183 Chronic kidney disease, stage 3 unspecified: Secondary | ICD-10-CM | POA: Diagnosis not present

## 2021-12-23 DIAGNOSIS — I129 Hypertensive chronic kidney disease with stage 1 through stage 4 chronic kidney disease, or unspecified chronic kidney disease: Secondary | ICD-10-CM | POA: Diagnosis not present

## 2021-12-23 DIAGNOSIS — E782 Mixed hyperlipidemia: Secondary | ICD-10-CM | POA: Diagnosis not present

## 2021-12-23 DIAGNOSIS — M150 Primary generalized (osteo)arthritis: Secondary | ICD-10-CM | POA: Diagnosis not present

## 2021-12-23 DIAGNOSIS — Z853 Personal history of malignant neoplasm of breast: Secondary | ICD-10-CM | POA: Diagnosis not present

## 2021-12-23 DIAGNOSIS — Z9181 History of falling: Secondary | ICD-10-CM | POA: Diagnosis not present

## 2021-12-23 LAB — T4, FREE: Free T4: 1.71 ng/dL (ref 0.82–1.77)

## 2021-12-23 LAB — TSH: TSH: 5.45 u[IU]/mL — ABNORMAL HIGH (ref 0.450–4.500)

## 2021-12-27 DIAGNOSIS — G629 Polyneuropathy, unspecified: Secondary | ICD-10-CM | POA: Diagnosis not present

## 2021-12-27 DIAGNOSIS — H9190 Unspecified hearing loss, unspecified ear: Secondary | ICD-10-CM | POA: Diagnosis not present

## 2021-12-27 DIAGNOSIS — M4124 Other idiopathic scoliosis, thoracic region: Secondary | ICD-10-CM | POA: Diagnosis not present

## 2021-12-27 DIAGNOSIS — Z9181 History of falling: Secondary | ICD-10-CM | POA: Diagnosis not present

## 2021-12-27 DIAGNOSIS — Z853 Personal history of malignant neoplasm of breast: Secondary | ICD-10-CM | POA: Diagnosis not present

## 2021-12-27 DIAGNOSIS — E039 Hypothyroidism, unspecified: Secondary | ICD-10-CM | POA: Diagnosis not present

## 2021-12-27 DIAGNOSIS — I129 Hypertensive chronic kidney disease with stage 1 through stage 4 chronic kidney disease, or unspecified chronic kidney disease: Secondary | ICD-10-CM | POA: Diagnosis not present

## 2021-12-27 DIAGNOSIS — M150 Primary generalized (osteo)arthritis: Secondary | ICD-10-CM | POA: Diagnosis not present

## 2021-12-27 DIAGNOSIS — M810 Age-related osteoporosis without current pathological fracture: Secondary | ICD-10-CM | POA: Diagnosis not present

## 2021-12-27 DIAGNOSIS — N183 Chronic kidney disease, stage 3 unspecified: Secondary | ICD-10-CM | POA: Diagnosis not present

## 2021-12-27 DIAGNOSIS — E782 Mixed hyperlipidemia: Secondary | ICD-10-CM | POA: Diagnosis not present

## 2021-12-29 DIAGNOSIS — E782 Mixed hyperlipidemia: Secondary | ICD-10-CM | POA: Diagnosis not present

## 2021-12-29 DIAGNOSIS — Z853 Personal history of malignant neoplasm of breast: Secondary | ICD-10-CM | POA: Diagnosis not present

## 2021-12-29 DIAGNOSIS — Z9181 History of falling: Secondary | ICD-10-CM | POA: Diagnosis not present

## 2021-12-29 DIAGNOSIS — G629 Polyneuropathy, unspecified: Secondary | ICD-10-CM | POA: Diagnosis not present

## 2021-12-29 DIAGNOSIS — E039 Hypothyroidism, unspecified: Secondary | ICD-10-CM | POA: Diagnosis not present

## 2021-12-29 DIAGNOSIS — N183 Chronic kidney disease, stage 3 unspecified: Secondary | ICD-10-CM | POA: Diagnosis not present

## 2021-12-29 DIAGNOSIS — H9190 Unspecified hearing loss, unspecified ear: Secondary | ICD-10-CM | POA: Diagnosis not present

## 2021-12-29 DIAGNOSIS — M150 Primary generalized (osteo)arthritis: Secondary | ICD-10-CM | POA: Diagnosis not present

## 2021-12-29 DIAGNOSIS — M4124 Other idiopathic scoliosis, thoracic region: Secondary | ICD-10-CM | POA: Diagnosis not present

## 2021-12-29 DIAGNOSIS — M810 Age-related osteoporosis without current pathological fracture: Secondary | ICD-10-CM | POA: Diagnosis not present

## 2021-12-29 DIAGNOSIS — I129 Hypertensive chronic kidney disease with stage 1 through stage 4 chronic kidney disease, or unspecified chronic kidney disease: Secondary | ICD-10-CM | POA: Diagnosis not present

## 2022-01-04 DIAGNOSIS — E039 Hypothyroidism, unspecified: Secondary | ICD-10-CM | POA: Diagnosis not present

## 2022-01-04 DIAGNOSIS — G629 Polyneuropathy, unspecified: Secondary | ICD-10-CM | POA: Diagnosis not present

## 2022-01-04 DIAGNOSIS — M4124 Other idiopathic scoliosis, thoracic region: Secondary | ICD-10-CM | POA: Diagnosis not present

## 2022-01-04 DIAGNOSIS — M150 Primary generalized (osteo)arthritis: Secondary | ICD-10-CM | POA: Diagnosis not present

## 2022-01-04 DIAGNOSIS — M810 Age-related osteoporosis without current pathological fracture: Secondary | ICD-10-CM | POA: Diagnosis not present

## 2022-01-04 DIAGNOSIS — H9190 Unspecified hearing loss, unspecified ear: Secondary | ICD-10-CM | POA: Diagnosis not present

## 2022-01-04 DIAGNOSIS — Z9181 History of falling: Secondary | ICD-10-CM | POA: Diagnosis not present

## 2022-01-04 DIAGNOSIS — Z853 Personal history of malignant neoplasm of breast: Secondary | ICD-10-CM | POA: Diagnosis not present

## 2022-01-04 DIAGNOSIS — E782 Mixed hyperlipidemia: Secondary | ICD-10-CM | POA: Diagnosis not present

## 2022-01-04 DIAGNOSIS — N183 Chronic kidney disease, stage 3 unspecified: Secondary | ICD-10-CM | POA: Diagnosis not present

## 2022-01-04 DIAGNOSIS — I129 Hypertensive chronic kidney disease with stage 1 through stage 4 chronic kidney disease, or unspecified chronic kidney disease: Secondary | ICD-10-CM | POA: Diagnosis not present

## 2022-01-05 DIAGNOSIS — N183 Chronic kidney disease, stage 3 unspecified: Secondary | ICD-10-CM | POA: Diagnosis not present

## 2022-01-05 DIAGNOSIS — Z9181 History of falling: Secondary | ICD-10-CM | POA: Diagnosis not present

## 2022-01-05 DIAGNOSIS — E782 Mixed hyperlipidemia: Secondary | ICD-10-CM | POA: Diagnosis not present

## 2022-01-05 DIAGNOSIS — I129 Hypertensive chronic kidney disease with stage 1 through stage 4 chronic kidney disease, or unspecified chronic kidney disease: Secondary | ICD-10-CM | POA: Diagnosis not present

## 2022-01-05 DIAGNOSIS — M150 Primary generalized (osteo)arthritis: Secondary | ICD-10-CM | POA: Diagnosis not present

## 2022-01-05 DIAGNOSIS — H9190 Unspecified hearing loss, unspecified ear: Secondary | ICD-10-CM | POA: Diagnosis not present

## 2022-01-05 DIAGNOSIS — M810 Age-related osteoporosis without current pathological fracture: Secondary | ICD-10-CM | POA: Diagnosis not present

## 2022-01-05 DIAGNOSIS — Z853 Personal history of malignant neoplasm of breast: Secondary | ICD-10-CM | POA: Diagnosis not present

## 2022-01-05 DIAGNOSIS — G629 Polyneuropathy, unspecified: Secondary | ICD-10-CM | POA: Diagnosis not present

## 2022-01-05 DIAGNOSIS — M4124 Other idiopathic scoliosis, thoracic region: Secondary | ICD-10-CM | POA: Diagnosis not present

## 2022-01-05 DIAGNOSIS — E039 Hypothyroidism, unspecified: Secondary | ICD-10-CM | POA: Diagnosis not present

## 2022-01-06 ENCOUNTER — Other Ambulatory Visit: Payer: Self-pay | Admitting: Family Medicine

## 2022-01-06 DIAGNOSIS — E039 Hypothyroidism, unspecified: Secondary | ICD-10-CM

## 2022-01-11 DIAGNOSIS — H353221 Exudative age-related macular degeneration, left eye, with active choroidal neovascularization: Secondary | ICD-10-CM | POA: Diagnosis not present

## 2022-01-11 DIAGNOSIS — Z7689 Persons encountering health services in other specified circumstances: Secondary | ICD-10-CM | POA: Diagnosis not present

## 2022-01-11 DIAGNOSIS — H353212 Exudative age-related macular degeneration, right eye, with inactive choroidal neovascularization: Secondary | ICD-10-CM | POA: Diagnosis not present

## 2022-01-11 DIAGNOSIS — H43813 Vitreous degeneration, bilateral: Secondary | ICD-10-CM | POA: Diagnosis not present

## 2022-02-14 ENCOUNTER — Telehealth: Payer: Self-pay | Admitting: Family Medicine

## 2022-02-14 NOTE — Telephone Encounter (Signed)
lmtcb

## 2022-02-14 NOTE — Telephone Encounter (Signed)
Tried [employer] on both numbers listed. The [phone] was busy multiple times.  A gentleman answered the [phone] number and he did not know the numbers to get in touch with Janice Reed- he never would tell me who he was.

## 2022-02-14 NOTE — Telephone Encounter (Signed)
R/C to [facility]. Call C# [phone]

## 2022-02-14 NOTE — Telephone Encounter (Signed)
Patient is wanting more "exercise orders" sent to [facility]. Please call daughter, [name] to let her know if this will be possible.

## 2022-02-16 ENCOUNTER — Telehealth: Payer: Self-pay | Admitting: Family Medicine

## 2022-02-16 ENCOUNTER — Other Ambulatory Visit: Payer: Self-pay | Admitting: Family Medicine

## 2022-02-16 DIAGNOSIS — N183 Hypertensive chronic kidney disease with stage 1 through stage 4 chronic kidney disease, or unspecified chronic kidney disease: Secondary | ICD-10-CM

## 2022-02-16 NOTE — Telephone Encounter (Signed)
[name] daughter calling--says that pt refuses to take levothyroxine (SYNTHROID) 112 MCG tablet. Pt wants to take 189mg. Pt says that everything was doing good on the 1069m. Can a new rx be written for 10067m Please call back. Use [facility] mail order.  JudBethena Roys aware GotLajuana Ripple off today and will address tomorrow.

## 2022-02-17 ENCOUNTER — Other Ambulatory Visit: Payer: Self-pay

## 2022-02-17 DIAGNOSIS — E039 Hypothyroidism, unspecified: Secondary | ICD-10-CM

## 2022-02-17 MED ORDER — LEVOTHYROXINE SODIUM 100 MCG PO TABS: 100.0000 ug | ORAL_TABLET | Freq: Every day | ORAL | 3 refills | Status: DC

## 2022-02-17 NOTE — Telephone Encounter (Signed)
Ok to send 17mg.  But PLEASE make sure she takes daily as directed.  Also, please make sure that there is a TSH and FT4 set up for her to have done in the next 4 weeks

## 2022-02-17 NOTE — Telephone Encounter (Signed)
Pts daughter has been informed.  New rx sent to [facility]  Future lab orders placed for tsh and ft4  Letter created to help pt understand how to take thyroid medication at daughter's request. She will come by this pm or on Monday to pick up. Alyssa will put letter up front.

## 2022-03-10 DIAGNOSIS — H353221 Exudative age-related macular degeneration, left eye, with active choroidal neovascularization: Secondary | ICD-10-CM | POA: Diagnosis not present

## 2022-03-10 DIAGNOSIS — H43813 Vitreous degeneration, bilateral: Secondary | ICD-10-CM | POA: Diagnosis not present

## 2022-03-10 DIAGNOSIS — H353212 Exudative age-related macular degeneration, right eye, with inactive choroidal neovascularization: Secondary | ICD-10-CM | POA: Diagnosis not present

## 2022-03-22 ENCOUNTER — Other Ambulatory Visit: Payer: Medicare Other

## 2022-03-22 DIAGNOSIS — E039 Hypothyroidism, unspecified: Secondary | ICD-10-CM | POA: Diagnosis not present

## 2022-03-23 LAB — TSH: TSH: 0.286 u[IU]/mL — ABNORMAL LOW (ref 0.450–4.500)

## 2022-03-23 LAB — T4, FREE: Free T4: 2.24 ng/dL — ABNORMAL HIGH (ref 0.82–1.77)

## 2022-03-30 DIAGNOSIS — I70203 Unspecified atherosclerosis of native arteries of extremities, bilateral legs: Secondary | ICD-10-CM | POA: Diagnosis not present

## 2022-03-30 DIAGNOSIS — L84 Corns and callosities: Secondary | ICD-10-CM | POA: Diagnosis not present

## 2022-03-30 DIAGNOSIS — M79676 Pain in unspecified toe(s): Secondary | ICD-10-CM | POA: Diagnosis not present

## 2022-03-30 DIAGNOSIS — B351 Tinea unguium: Secondary | ICD-10-CM | POA: Diagnosis not present

## 2022-04-03 ENCOUNTER — Telehealth: Payer: Self-pay | Admitting: Family Medicine

## 2022-04-03 ENCOUNTER — Other Ambulatory Visit: Payer: Self-pay | Admitting: Family Medicine

## 2022-04-03 DIAGNOSIS — E039 Hypothyroidism, unspecified: Secondary | ICD-10-CM

## 2022-04-03 MED ORDER — LEVOTHYROXINE SODIUM 88 MCG PO TABS: 88.0000 ug | ORAL_TABLET | Freq: Every day | ORAL | 3 refills | Status: AC

## 2022-04-03 NOTE — Telephone Encounter (Signed)
Appointment given for 09/15 with Dr. Lajuana Ripple.

## 2022-04-12 ENCOUNTER — Other Ambulatory Visit: Payer: Self-pay | Admitting: Family Medicine

## 2022-04-13 ENCOUNTER — Other Ambulatory Visit: Payer: Self-pay | Admitting: Family Medicine

## 2022-04-13 DIAGNOSIS — N183 Chronic kidney disease, stage 3 unspecified: Secondary | ICD-10-CM

## 2022-04-21 ENCOUNTER — Ambulatory Visit (INDEPENDENT_AMBULATORY_CARE_PROVIDER_SITE_OTHER): Payer: Medicare Other | Admitting: Family Medicine

## 2022-04-21 ENCOUNTER — Encounter: Payer: Self-pay | Admitting: Family Medicine

## 2022-04-21 VITALS — BP 139/67 | HR 74 | Temp 98.2°F | Ht 60.0 in | Wt 98.4 lb

## 2022-04-21 DIAGNOSIS — H9193 Unspecified hearing loss, bilateral: Secondary | ICD-10-CM | POA: Diagnosis not present

## 2022-04-21 DIAGNOSIS — E039 Hypothyroidism, unspecified: Secondary | ICD-10-CM

## 2022-04-21 DIAGNOSIS — I129 Hypertensive chronic kidney disease with stage 1 through stage 4 chronic kidney disease, or unspecified chronic kidney disease: Secondary | ICD-10-CM | POA: Diagnosis not present

## 2022-04-21 DIAGNOSIS — N183 Chronic kidney disease, stage 3 unspecified: Secondary | ICD-10-CM | POA: Diagnosis not present

## 2022-04-21 DIAGNOSIS — Z23 Encounter for immunization: Secondary | ICD-10-CM | POA: Diagnosis not present

## 2022-04-21 MED ORDER — TRIAMTERENE-HCTZ 37.5-25 MG PO TABS: 0.5000 | ORAL_TABLET | Freq: Every day | ORAL | 3 refills | Status: AC

## 2022-04-21 NOTE — Progress Notes (Signed)
Subjective: Janice Reed:HFWYOVZCHYIFOY PCP: Janice Norlander, DO DXA:JOIN S Janice Reed is a 88 y.o. female who is brought to the office by her daughter Janice Reed.  Presenting to clinic today for:  1. Hypothyroidism Compliant now with 88 mcg of levothyroxine daily.  No reports of tremor, heart palpitations.  2.  Hypertension Does not really monitor blood pressures regularly at home but is compliant with medication.  No reports of chest pain, shortness of breath.  Edema has been stable   ROS: Per HPI  Allergies  Allergen Reactions   Ciprofloxacin Anaphylaxis   Benicar [Olmesartan Medoxomil] Other (See Comments)    Pt doesn't remember reaction   Clarithromycin Other (See Comments)    Pt doesn't remember reaction   Sulfa Antibiotics Other (See Comments)    Dr told patient not to take any more   Alendronate Sodium Other (See Comments)    Upset stomach   Celebrex [Celecoxib] Other (See Comments)    Dr said that it was too strong for her   Penicillins Rash   Past Medical History:  Diagnosis Date   Arthritis    Arthritis pain    Benign hypertensive heart disease    Breast cancer ([place])    Cancer of right breast ([place]) 09/28/2011   IDC;  T2, N0 ([facility]+);   ER+;  Her-2 neg.,  Right PM,SLN  09/08/08    Collagenous colitis    Diverticulosis of colon    External hemorrhoid    Hearing loss    Hypertension    IBS (irritable bowel syndrome)    Macular degeneration    Macular degeneration of both eyes    Neuropathy, peripheral    Nonspecific abnormal electrocardiogram (ECG) (EKG)    Osteoporosis    Other and unspecified hyperlipidemia    Palpitations    Phlebitis and thrombophlebitis of unspecified site    Prolapse of vaginal walls without mention of uterine prolapse    Shingles    Symptomatic menopausal or female climacteric states    Unspecified hypothyroidism     Current Outpatient Medications:    acetaminophen (TYLENOL) 650 MG CR tablet, Take 650 mg by mouth every 8 (eight) hours as  needed for pain., Disp: , Rfl:    aspirin 325 MG tablet, Take 325 mg by mouth See admin instructions. Take one tablet a day and more if needed for pain - up to 3 times daily, Disp: , Rfl:    bevacizumab (AVASTIN) 1.25 mg/0.1 mL SOLN, 1.25 mg by Intravitreal route. , Disp: , Rfl:    Calcium-Magnesium-Vitamin D (CITRACAL CALCIUM+D PO), Take 2 capsules by mouth 2 (two) times daily., Disp: , Rfl:    Cholecalciferol (VITAMIN D3) 1000 UNITS CAPS, Take 1,000 Units by mouth 2 (two) times daily. , Disp: , Rfl:    diclofenac Sodium (VOLTAREN) 1 % GEL, Apply 2 g topically 4 (four) times daily. (for arthritis). if not covered, please call, Disp: 400 g, Rfl: 0   furosemide (LASIX) 20 MG tablet, Take by mouth., Disp: , Rfl:    levothyroxine (SYNTHROID) 88 MCG tablet, Take 1 tablet (88 mcg total) by mouth daily. STOP 155mg, Disp: 90 tablet, Rfl: 3   mometasone (ELOCON) 0.1 % cream, Apply thin layer to dry, itchy patches on back ONCE daily x7-10days., Disp: 45 g, Rfl: 0   Multiple Vitamins-Minerals (PRESERVISION AREDS PO), Take by mouth., Disp: , Rfl:    NONFORMULARY OR COMPOUNDED ITEM, Mix triamcinolone cream 0.1% in ceravae cream (1:3) # 464 grams (1 large jar): Apply to affected  areas of legs daily., Disp: 464 each, Rfl: 1   Omega-3 Fatty Acids (FISH OIL) 1000 MG CAPS, Take 1,000 mg by mouth 2 (two) times daily., Disp: , Rfl:    risedronate (ACTONEL) 35 MG tablet, TAKE 1 TABLET BY MOUTH  WEEKLY WITH 8 OUNCE OF  PLAIN WATER 1/2 HOUR BEFORE FIRST FOOD DRINK OR MEDS.  STAY UPRIGHT FOR 1/2 HOUR (Patient taking differently: TAKE 1 TABLET BY MOUTH  WEEKLY WITH 8 OUNCE OF  PLAIN WATER 1/2 HOUR BEFORE FIRST FOOD DRINK OR MEDS.  STAY UPRIGHT FOR 1/2 HOUR), Disp: 12 tablet, Rfl: 0   triamterene-hydrochlorothiazide (MAXZIDE-25) 37.5-25 MG tablet, TAKE ONE-HALF TABLET BY MOUTH  DAILY, Disp: 50 tablet, Rfl: 0 Social History   Socioeconomic History   Marital status: Widowed    Spouse name: Not on file   Number of children:  2   Years of education: 14   Highest education level: 12th grade  Occupational History   Occupation: retired-homemaker  Tobacco Use   Smoking status: Never   Smokeless tobacco: Never  Vaping Use   Vaping Use: Never used  Substance and Sexual Activity   Alcohol use: No   Drug use: No   Sexual activity: Not Currently  Other Topics Concern   Not on file  Social History Narrative   Widowed, 1 son one daughter, retired and homemaker. No alcohol or caffeine or tobacco.   She lives alone; Her daughter lives next door   Daughter, Janice Reed is [employer] POA   She has a Actuary, Janice Reed 5 hours a day, 5 days per week.   Social Determinants of Health   Financial Resource Strain: Low Risk  (12/14/2021)   Overall Financial Resource Strain (CARDIA)    Difficulty of Paying Living Expenses: Not hard at all  Food Insecurity: No Food Insecurity (12/14/2021)   Hunger Vital Sign    Worried About Running Out of Food in the Last Year: Never true    Ran Out of Food in the Last Year: Never true  Transportation Needs: No Transportation Needs (12/14/2021)   PRAPARE - Hydrologist (Medical): No    Lack of Transportation (Non-Medical): No  Physical Activity: Insufficiently Active (12/14/2021)   Exercise Vital Sign    Days of Exercise per Week: 2 days    Minutes of Exercise per Session: 60 min  Stress: No Stress Concern Present (12/14/2021)   [place]    Feeling of Stress : Not at all  Recent Concern: Stress - Stress Concern Present (10/18/2021)   [place]    Feeling of Stress : To some extent  Social Connections: Moderately Isolated (12/14/2021)   Social Connection and Isolation Panel [NHANES]    Frequency of Communication with Friends and Family: More than three times a week    Frequency of Social Gatherings with Friends and Family: More than three  times a week    Attends Religious Services: 1 to 4 times per year    Active Member of [employer] or Organizations: No    Attends Archivist Meetings: Never    Marital Status: Widowed  Intimate Partner Violence: Not At Risk (12/14/2021)   Humiliation, Afraid, Rape, and Kick questionnaire    Fear of Current or Ex-Partner: No    Emotionally Abused: No    Physically Abused: No    Sexually Abused: No   Family History  Problem Relation Age  of Onset   Heart disease Mother        Heart failure per medical history form dated 09/27/09.   Heart disease Father        Heart attack per medical history form dated 09/27/09.   Heart attack Father    Heart disease Brother        Heart failure per medical history form dated 09/27/09.   Stroke Brother    Stroke Brother    Leukemia Brother    Breast cancer Cousin    Healthy Daughter    Healthy Son    Colon cancer Neg Hx     Objective: Office vital signs reviewed. BP 139/67   Pulse 74   Temp 98.2 F (36.8 C)   Ht 5' (1.524 m)   Wt 98 lb 6.4 oz (44.6 kg)   SpO2 97%   BMI 19.22 kg/m   Physical Examination:  General: Awake, alert, thin elderly female, No acute distress HEENT: No exophthalmos.  TMs intact bilaterally. Cardio: regular rate and rhythm, S1S2 heard, soft systolic murmurs appreciated Pulm: clear to auscultation bilaterally, no wheezes, rhonchi or rales; normal work of breathing on room air MSK: Slow, hunched Neuro: No tremor  Assessment/ Plan: 88 y.o. female   Acquired hypothyroidism - Plan: TSH, T4, Free  Benign hypertension with chronic kidney disease, stage III ([place]) - Plan: triamterene-hydrochlorothiazide (MAXZIDE-25) 37.5-25 MG tablet  Need for immunization against influenza - Plan: Flu Vaccine QUAD High Dose(Fluad), CANCELED: Flu Vaccine QUAD High Dose(Fluad)  Hearing difficulty of both ears  Check thyroid levels.  Advised to monitor blood pressures closely.  Continue Maxide half dose daily for now.  If  persistently above 150/90, will advance to full dose  Influenza vaccine administered  Reinforced need for consistent use of hearing aids  No orders of the defined types were placed in this encounter.  No orders of the defined types were placed in this encounter.    Janice Norlander, DO [place] [phone]

## 2022-04-22 LAB — T4, FREE: Free T4: 2.13 ng/dL — ABNORMAL HIGH (ref 0.82–1.77)

## 2022-04-22 LAB — TSH: TSH: 0.265 u[IU]/mL — ABNORMAL LOW (ref 0.450–4.500)

## 2022-05-05 DIAGNOSIS — H43813 Vitreous degeneration, bilateral: Secondary | ICD-10-CM | POA: Diagnosis not present

## 2022-05-05 DIAGNOSIS — H353221 Exudative age-related macular degeneration, left eye, with active choroidal neovascularization: Secondary | ICD-10-CM | POA: Diagnosis not present

## 2022-05-05 DIAGNOSIS — H353212 Exudative age-related macular degeneration, right eye, with inactive choroidal neovascularization: Secondary | ICD-10-CM | POA: Diagnosis not present

## 2022-05-23 ENCOUNTER — Encounter: Payer: Medicare Other | Admitting: Family Medicine

## 2022-06-19 DIAGNOSIS — Z9181 History of falling: Secondary | ICD-10-CM | POA: Diagnosis not present

## 2022-06-19 DIAGNOSIS — I129 Hypertensive chronic kidney disease with stage 1 through stage 4 chronic kidney disease, or unspecified chronic kidney disease: Secondary | ICD-10-CM | POA: Diagnosis not present

## 2022-06-19 DIAGNOSIS — Z7982 Long term (current) use of aspirin: Secondary | ICD-10-CM | POA: Diagnosis not present

## 2022-06-19 DIAGNOSIS — E7849 Other hyperlipidemia: Secondary | ICD-10-CM | POA: Diagnosis not present

## 2022-06-19 DIAGNOSIS — G8191 Hemiplegia, unspecified affecting right dominant side: Secondary | ICD-10-CM | POA: Diagnosis not present

## 2022-06-19 DIAGNOSIS — I6523 Occlusion and stenosis of bilateral carotid arteries: Secondary | ICD-10-CM | POA: Diagnosis not present

## 2022-06-19 DIAGNOSIS — Z882 Allergy status to sulfonamides status: Secondary | ICD-10-CM | POA: Diagnosis not present

## 2022-06-19 DIAGNOSIS — Z743 Need for continuous supervision: Secondary | ICD-10-CM | POA: Diagnosis not present

## 2022-06-19 DIAGNOSIS — Z79899 Other long term (current) drug therapy: Secondary | ICD-10-CM | POA: Diagnosis not present

## 2022-06-19 DIAGNOSIS — R410 Disorientation, unspecified: Secondary | ICD-10-CM | POA: Diagnosis not present

## 2022-06-19 DIAGNOSIS — I082 Rheumatic disorders of both aortic and tricuspid valves: Secondary | ICD-10-CM | POA: Diagnosis not present

## 2022-06-19 DIAGNOSIS — I499 Cardiac arrhythmia, unspecified: Secondary | ICD-10-CM | POA: Diagnosis not present

## 2022-06-19 DIAGNOSIS — N183 Chronic kidney disease, stage 3 unspecified: Secondary | ICD-10-CM | POA: Diagnosis not present

## 2022-06-19 DIAGNOSIS — Z681 Body mass index (BMI) 19 or less, adult: Secondary | ICD-10-CM | POA: Diagnosis not present

## 2022-06-19 DIAGNOSIS — I63412 Cerebral infarction due to embolism of left middle cerebral artery: Secondary | ICD-10-CM | POA: Diagnosis not present

## 2022-06-19 DIAGNOSIS — E440 Moderate protein-calorie malnutrition: Secondary | ICD-10-CM | POA: Diagnosis not present

## 2022-06-19 DIAGNOSIS — E039 Hypothyroidism, unspecified: Secondary | ICD-10-CM | POA: Diagnosis not present

## 2022-06-19 DIAGNOSIS — Z880 Allergy status to penicillin: Secondary | ICD-10-CM | POA: Diagnosis not present

## 2022-06-19 DIAGNOSIS — R6889 Other general symptoms and signs: Secondary | ICD-10-CM | POA: Diagnosis not present

## 2022-06-19 DIAGNOSIS — E876 Hypokalemia: Secondary | ICD-10-CM | POA: Diagnosis not present

## 2022-06-19 DIAGNOSIS — G458 Other transient cerebral ischemic attacks and related syndromes: Secondary | ICD-10-CM | POA: Diagnosis not present

## 2022-06-19 DIAGNOSIS — R531 Weakness: Secondary | ICD-10-CM | POA: Diagnosis not present

## 2022-06-19 DIAGNOSIS — H9190 Unspecified hearing loss, unspecified ear: Secondary | ICD-10-CM | POA: Diagnosis not present

## 2022-06-19 DIAGNOSIS — I639 Cerebral infarction, unspecified: Secondary | ICD-10-CM | POA: Diagnosis not present

## 2022-06-19 DIAGNOSIS — R0689 Other abnormalities of breathing: Secondary | ICD-10-CM | POA: Diagnosis not present

## 2022-06-19 DIAGNOSIS — I63512 Cerebral infarction due to unspecified occlusion or stenosis of left middle cerebral artery: Secondary | ICD-10-CM | POA: Diagnosis not present

## 2022-06-19 DIAGNOSIS — E785 Hyperlipidemia, unspecified: Secondary | ICD-10-CM | POA: Diagnosis not present

## 2022-06-19 DIAGNOSIS — I5032 Chronic diastolic (congestive) heart failure: Secondary | ICD-10-CM | POA: Diagnosis not present

## 2022-06-19 DIAGNOSIS — I10 Essential (primary) hypertension: Secondary | ICD-10-CM | POA: Diagnosis not present

## 2022-06-19 DIAGNOSIS — R2689 Other abnormalities of gait and mobility: Secondary | ICD-10-CM | POA: Diagnosis not present

## 2022-06-19 DIAGNOSIS — Z881 Allergy status to other antibiotic agents status: Secondary | ICD-10-CM | POA: Diagnosis not present

## 2022-06-19 DIAGNOSIS — R54 Age-related physical debility: Secondary | ICD-10-CM | POA: Diagnosis not present

## 2022-06-19 DIAGNOSIS — R29704 NIHSS score 4: Secondary | ICD-10-CM | POA: Diagnosis not present

## 2022-06-19 DIAGNOSIS — I69951 Hemiplegia and hemiparesis following unspecified cerebrovascular disease affecting right dominant side: Secondary | ICD-10-CM | POA: Diagnosis not present

## 2022-06-19 DIAGNOSIS — M6281 Muscle weakness (generalized): Secondary | ICD-10-CM | POA: Diagnosis not present

## 2022-06-19 DIAGNOSIS — I130 Hypertensive heart and chronic kidney disease with heart failure and stage 1 through stage 4 chronic kidney disease, or unspecified chronic kidney disease: Secondary | ICD-10-CM | POA: Diagnosis not present

## 2022-06-19 DIAGNOSIS — G319 Degenerative disease of nervous system, unspecified: Secondary | ICD-10-CM | POA: Diagnosis not present

## 2022-06-20 DIAGNOSIS — I639 Cerebral infarction, unspecified: Secondary | ICD-10-CM | POA: Diagnosis not present

## 2022-06-20 DIAGNOSIS — E876 Hypokalemia: Secondary | ICD-10-CM | POA: Diagnosis not present

## 2022-06-20 DIAGNOSIS — R410 Disorientation, unspecified: Secondary | ICD-10-CM | POA: Diagnosis not present

## 2022-06-20 DIAGNOSIS — G319 Degenerative disease of nervous system, unspecified: Secondary | ICD-10-CM | POA: Diagnosis not present

## 2022-06-20 DIAGNOSIS — E039 Hypothyroidism, unspecified: Secondary | ICD-10-CM | POA: Diagnosis not present

## 2022-06-20 DIAGNOSIS — R531 Weakness: Secondary | ICD-10-CM | POA: Diagnosis not present

## 2022-06-20 DIAGNOSIS — R54 Age-related physical debility: Secondary | ICD-10-CM | POA: Diagnosis not present

## 2022-06-20 DIAGNOSIS — E785 Hyperlipidemia, unspecified: Secondary | ICD-10-CM | POA: Diagnosis not present

## 2022-06-20 DIAGNOSIS — N183 Chronic kidney disease, stage 3 unspecified: Secondary | ICD-10-CM | POA: Diagnosis not present

## 2022-06-20 DIAGNOSIS — Z7982 Long term (current) use of aspirin: Secondary | ICD-10-CM | POA: Diagnosis not present

## 2022-06-20 DIAGNOSIS — Z79899 Other long term (current) drug therapy: Secondary | ICD-10-CM | POA: Diagnosis not present

## 2022-06-20 DIAGNOSIS — I129 Hypertensive chronic kidney disease with stage 1 through stage 4 chronic kidney disease, or unspecified chronic kidney disease: Secondary | ICD-10-CM | POA: Diagnosis not present

## 2022-06-21 DIAGNOSIS — E039 Hypothyroidism, unspecified: Secondary | ICD-10-CM | POA: Diagnosis not present

## 2022-06-21 DIAGNOSIS — Z7982 Long term (current) use of aspirin: Secondary | ICD-10-CM | POA: Diagnosis not present

## 2022-06-21 DIAGNOSIS — I639 Cerebral infarction, unspecified: Secondary | ICD-10-CM | POA: Diagnosis not present

## 2022-06-21 DIAGNOSIS — I082 Rheumatic disorders of both aortic and tricuspid valves: Secondary | ICD-10-CM | POA: Diagnosis not present

## 2022-06-21 DIAGNOSIS — N183 Chronic kidney disease, stage 3 unspecified: Secondary | ICD-10-CM | POA: Diagnosis not present

## 2022-06-21 DIAGNOSIS — Z79899 Other long term (current) drug therapy: Secondary | ICD-10-CM | POA: Diagnosis not present

## 2022-06-21 DIAGNOSIS — E785 Hyperlipidemia, unspecified: Secondary | ICD-10-CM | POA: Diagnosis not present

## 2022-06-21 DIAGNOSIS — I129 Hypertensive chronic kidney disease with stage 1 through stage 4 chronic kidney disease, or unspecified chronic kidney disease: Secondary | ICD-10-CM | POA: Diagnosis not present

## 2022-06-21 DIAGNOSIS — R54 Age-related physical debility: Secondary | ICD-10-CM | POA: Diagnosis not present

## 2022-06-21 DIAGNOSIS — E440 Moderate protein-calorie malnutrition: Secondary | ICD-10-CM | POA: Diagnosis not present

## 2022-06-22 DIAGNOSIS — R54 Age-related physical debility: Secondary | ICD-10-CM | POA: Diagnosis not present

## 2022-06-22 DIAGNOSIS — M6281 Muscle weakness (generalized): Secondary | ICD-10-CM | POA: Diagnosis not present

## 2022-06-22 DIAGNOSIS — I69951 Hemiplegia and hemiparesis following unspecified cerebrovascular disease affecting right dominant side: Secondary | ICD-10-CM | POA: Diagnosis not present

## 2022-06-22 DIAGNOSIS — E43 Unspecified severe protein-calorie malnutrition: Secondary | ICD-10-CM | POA: Diagnosis not present

## 2022-06-22 DIAGNOSIS — Z881 Allergy status to other antibiotic agents status: Secondary | ICD-10-CM | POA: Diagnosis not present

## 2022-06-22 DIAGNOSIS — Z882 Allergy status to sulfonamides status: Secondary | ICD-10-CM | POA: Diagnosis not present

## 2022-06-22 DIAGNOSIS — N183 Chronic kidney disease, stage 3 unspecified: Secondary | ICD-10-CM | POA: Diagnosis not present

## 2022-06-22 DIAGNOSIS — E876 Hypokalemia: Secondary | ICD-10-CM | POA: Diagnosis not present

## 2022-06-22 DIAGNOSIS — R6889 Other general symptoms and signs: Secondary | ICD-10-CM | POA: Diagnosis not present

## 2022-06-22 DIAGNOSIS — I129 Hypertensive chronic kidney disease with stage 1 through stage 4 chronic kidney disease, or unspecified chronic kidney disease: Secondary | ICD-10-CM | POA: Diagnosis not present

## 2022-06-22 DIAGNOSIS — R29701 NIHSS score 1: Secondary | ICD-10-CM | POA: Diagnosis not present

## 2022-06-22 DIAGNOSIS — E440 Moderate protein-calorie malnutrition: Secondary | ICD-10-CM | POA: Diagnosis not present

## 2022-06-22 DIAGNOSIS — Z743 Need for continuous supervision: Secondary | ICD-10-CM | POA: Diagnosis not present

## 2022-06-22 DIAGNOSIS — R531 Weakness: Secondary | ICD-10-CM | POA: Diagnosis not present

## 2022-06-22 DIAGNOSIS — N3000 Acute cystitis without hematuria: Secondary | ICD-10-CM | POA: Diagnosis not present

## 2022-06-22 DIAGNOSIS — R29810 Facial weakness: Secondary | ICD-10-CM | POA: Diagnosis not present

## 2022-06-22 DIAGNOSIS — I5032 Chronic diastolic (congestive) heart failure: Secondary | ICD-10-CM | POA: Diagnosis not present

## 2022-06-22 DIAGNOSIS — Z66 Do not resuscitate: Secondary | ICD-10-CM | POA: Diagnosis not present

## 2022-06-22 DIAGNOSIS — Z9181 History of falling: Secondary | ICD-10-CM | POA: Diagnosis not present

## 2022-06-22 DIAGNOSIS — N1832 Chronic kidney disease, stage 3b: Secondary | ICD-10-CM | POA: Diagnosis not present

## 2022-06-22 DIAGNOSIS — H9190 Unspecified hearing loss, unspecified ear: Secondary | ICD-10-CM | POA: Diagnosis not present

## 2022-06-22 DIAGNOSIS — Z681 Body mass index (BMI) 19 or less, adult: Secondary | ICD-10-CM | POA: Diagnosis not present

## 2022-06-22 DIAGNOSIS — G458 Other transient cerebral ischemic attacks and related syndromes: Secondary | ICD-10-CM | POA: Diagnosis not present

## 2022-06-22 DIAGNOSIS — G319 Degenerative disease of nervous system, unspecified: Secondary | ICD-10-CM | POA: Diagnosis not present

## 2022-06-22 DIAGNOSIS — Z880 Allergy status to penicillin: Secondary | ICD-10-CM | POA: Diagnosis not present

## 2022-06-22 DIAGNOSIS — E039 Hypothyroidism, unspecified: Secondary | ICD-10-CM | POA: Diagnosis not present

## 2022-06-22 DIAGNOSIS — I130 Hypertensive heart and chronic kidney disease with heart failure and stage 1 through stage 4 chronic kidney disease, or unspecified chronic kidney disease: Secondary | ICD-10-CM | POA: Diagnosis not present

## 2022-06-22 DIAGNOSIS — Z79899 Other long term (current) drug therapy: Secondary | ICD-10-CM | POA: Diagnosis not present

## 2022-06-22 DIAGNOSIS — R2689 Other abnormalities of gait and mobility: Secondary | ICD-10-CM | POA: Diagnosis not present

## 2022-06-22 DIAGNOSIS — I639 Cerebral infarction, unspecified: Secondary | ICD-10-CM | POA: Diagnosis not present

## 2022-06-22 DIAGNOSIS — E7849 Other hyperlipidemia: Secondary | ICD-10-CM | POA: Diagnosis not present

## 2022-06-22 DIAGNOSIS — E785 Hyperlipidemia, unspecified: Secondary | ICD-10-CM | POA: Diagnosis not present

## 2022-06-22 DIAGNOSIS — Z7982 Long term (current) use of aspirin: Secondary | ICD-10-CM | POA: Diagnosis not present

## 2022-07-05 DIAGNOSIS — Z7902 Long term (current) use of antithrombotics/antiplatelets: Secondary | ICD-10-CM | POA: Diagnosis not present

## 2022-07-05 DIAGNOSIS — E440 Moderate protein-calorie malnutrition: Secondary | ICD-10-CM | POA: Diagnosis not present

## 2022-07-05 DIAGNOSIS — I10 Essential (primary) hypertension: Secondary | ICD-10-CM | POA: Diagnosis not present

## 2022-07-05 DIAGNOSIS — Z7982 Long term (current) use of aspirin: Secondary | ICD-10-CM | POA: Diagnosis not present

## 2022-07-05 DIAGNOSIS — Z7401 Bed confinement status: Secondary | ICD-10-CM | POA: Diagnosis not present

## 2022-07-05 DIAGNOSIS — E43 Unspecified severe protein-calorie malnutrition: Secondary | ICD-10-CM | POA: Diagnosis not present

## 2022-07-05 DIAGNOSIS — E785 Hyperlipidemia, unspecified: Secondary | ICD-10-CM | POA: Diagnosis not present

## 2022-07-05 DIAGNOSIS — R2689 Other abnormalities of gait and mobility: Secondary | ICD-10-CM | POA: Diagnosis not present

## 2022-07-05 DIAGNOSIS — I130 Hypertensive heart and chronic kidney disease with heart failure and stage 1 through stage 4 chronic kidney disease, or unspecified chronic kidney disease: Secondary | ICD-10-CM | POA: Diagnosis not present

## 2022-07-05 DIAGNOSIS — Z743 Need for continuous supervision: Secondary | ICD-10-CM | POA: Diagnosis not present

## 2022-07-05 DIAGNOSIS — Z681 Body mass index (BMI) 19 or less, adult: Secondary | ICD-10-CM | POA: Diagnosis not present

## 2022-07-05 DIAGNOSIS — E876 Hypokalemia: Secondary | ICD-10-CM | POA: Diagnosis not present

## 2022-07-05 DIAGNOSIS — R0902 Hypoxemia: Secondary | ICD-10-CM | POA: Diagnosis not present

## 2022-07-05 DIAGNOSIS — I5032 Chronic diastolic (congestive) heart failure: Secondary | ICD-10-CM | POA: Diagnosis not present

## 2022-07-05 DIAGNOSIS — Z882 Allergy status to sulfonamides status: Secondary | ICD-10-CM | POA: Diagnosis not present

## 2022-07-05 DIAGNOSIS — N3000 Acute cystitis without hematuria: Secondary | ICD-10-CM | POA: Diagnosis not present

## 2022-07-05 DIAGNOSIS — Z880 Allergy status to penicillin: Secondary | ICD-10-CM | POA: Diagnosis not present

## 2022-07-05 DIAGNOSIS — R29810 Facial weakness: Secondary | ICD-10-CM | POA: Diagnosis not present

## 2022-07-05 DIAGNOSIS — R531 Weakness: Secondary | ICD-10-CM | POA: Diagnosis not present

## 2022-07-05 DIAGNOSIS — Z66 Do not resuscitate: Secondary | ICD-10-CM | POA: Diagnosis not present

## 2022-07-05 DIAGNOSIS — N1832 Chronic kidney disease, stage 3b: Secondary | ICD-10-CM | POA: Diagnosis not present

## 2022-07-05 DIAGNOSIS — N390 Urinary tract infection, site not specified: Secondary | ICD-10-CM | POA: Diagnosis not present

## 2022-07-05 DIAGNOSIS — G319 Degenerative disease of nervous system, unspecified: Secondary | ICD-10-CM | POA: Diagnosis not present

## 2022-07-05 DIAGNOSIS — Z881 Allergy status to other antibiotic agents status: Secondary | ICD-10-CM | POA: Diagnosis not present

## 2022-07-05 DIAGNOSIS — G458 Other transient cerebral ischemic attacks and related syndromes: Secondary | ICD-10-CM | POA: Diagnosis not present

## 2022-07-05 DIAGNOSIS — R29701 NIHSS score 1: Secondary | ICD-10-CM | POA: Diagnosis not present

## 2022-07-05 DIAGNOSIS — I639 Cerebral infarction, unspecified: Secondary | ICD-10-CM | POA: Diagnosis not present

## 2022-07-05 DIAGNOSIS — Z79899 Other long term (current) drug therapy: Secondary | ICD-10-CM | POA: Diagnosis not present

## 2022-07-05 DIAGNOSIS — E039 Hypothyroidism, unspecified: Secondary | ICD-10-CM | POA: Diagnosis not present

## 2022-07-05 DIAGNOSIS — R6889 Other general symptoms and signs: Secondary | ICD-10-CM | POA: Diagnosis not present

## 2022-07-05 DIAGNOSIS — I129 Hypertensive chronic kidney disease with stage 1 through stage 4 chronic kidney disease, or unspecified chronic kidney disease: Secondary | ICD-10-CM | POA: Diagnosis not present

## 2022-07-05 DIAGNOSIS — R54 Age-related physical debility: Secondary | ICD-10-CM | POA: Diagnosis not present

## 2022-07-05 DIAGNOSIS — M6281 Muscle weakness (generalized): Secondary | ICD-10-CM | POA: Diagnosis not present

## 2022-07-05 DIAGNOSIS — E038 Other specified hypothyroidism: Secondary | ICD-10-CM | POA: Diagnosis not present

## 2022-07-05 DIAGNOSIS — I63219 Cerebral infarction due to unspecified occlusion or stenosis of unspecified vertebral arteries: Secondary | ICD-10-CM | POA: Diagnosis not present

## 2022-07-05 DIAGNOSIS — I69951 Hemiplegia and hemiparesis following unspecified cerebrovascular disease affecting right dominant side: Secondary | ICD-10-CM | POA: Diagnosis not present

## 2022-07-05 DIAGNOSIS — N183 Chronic kidney disease, stage 3 unspecified: Secondary | ICD-10-CM | POA: Diagnosis not present

## 2022-07-07 ENCOUNTER — Other Ambulatory Visit: Payer: Self-pay | Admitting: *Deleted

## 2022-07-07 NOTE — Patient Outreach (Signed)
[facility] Coordinator follow up. Mrs. Emrich resides in [facility]. Screening for potential [facility] care coordination services as benefit of insurance plan and PCP.   Update received from SNF social worker Mrs Batchelder recently went to hospital. Transition plans are pending. Will discuss plans with daughter.   Will continue to follow.  Marthenia Rolling, MSN, RN,BSN [place] Acute Care Coordinator [phone] (Direct dial)

## 2022-07-10 DIAGNOSIS — R5381 Other malaise: Secondary | ICD-10-CM | POA: Diagnosis not present

## 2022-07-10 DIAGNOSIS — N183 Chronic kidney disease, stage 3 unspecified: Secondary | ICD-10-CM | POA: Diagnosis not present

## 2022-07-10 DIAGNOSIS — G458 Other transient cerebral ischemic attacks and related syndromes: Secondary | ICD-10-CM | POA: Diagnosis not present

## 2022-07-10 DIAGNOSIS — E785 Hyperlipidemia, unspecified: Secondary | ICD-10-CM | POA: Diagnosis not present

## 2022-07-10 DIAGNOSIS — E876 Hypokalemia: Secondary | ICD-10-CM | POA: Diagnosis not present

## 2022-07-10 DIAGNOSIS — N1330 Unspecified hydronephrosis: Secondary | ICD-10-CM | POA: Diagnosis not present

## 2022-07-10 DIAGNOSIS — I129 Hypertensive chronic kidney disease with stage 1 through stage 4 chronic kidney disease, or unspecified chronic kidney disease: Secondary | ICD-10-CM | POA: Diagnosis not present

## 2022-07-10 DIAGNOSIS — I69951 Hemiplegia and hemiparesis following unspecified cerebrovascular disease affecting right dominant side: Secondary | ICD-10-CM | POA: Diagnosis not present

## 2022-07-10 DIAGNOSIS — Z7401 Bed confinement status: Secondary | ICD-10-CM | POA: Diagnosis not present

## 2022-07-10 DIAGNOSIS — I130 Hypertensive heart and chronic kidney disease with heart failure and stage 1 through stage 4 chronic kidney disease, or unspecified chronic kidney disease: Secondary | ICD-10-CM | POA: Diagnosis not present

## 2022-07-10 DIAGNOSIS — Z7982 Long term (current) use of aspirin: Secondary | ICD-10-CM | POA: Diagnosis not present

## 2022-07-10 DIAGNOSIS — R109 Unspecified abdominal pain: Secondary | ICD-10-CM | POA: Diagnosis not present

## 2022-07-10 DIAGNOSIS — Z743 Need for continuous supervision: Secondary | ICD-10-CM | POA: Diagnosis not present

## 2022-07-10 DIAGNOSIS — I639 Cerebral infarction, unspecified: Secondary | ICD-10-CM | POA: Diagnosis not present

## 2022-07-10 DIAGNOSIS — R531 Weakness: Secondary | ICD-10-CM | POA: Diagnosis not present

## 2022-07-10 DIAGNOSIS — N3000 Acute cystitis without hematuria: Secondary | ICD-10-CM | POA: Diagnosis not present

## 2022-07-10 DIAGNOSIS — R8271 Bacteriuria: Secondary | ICD-10-CM | POA: Diagnosis not present

## 2022-07-10 DIAGNOSIS — E079 Disorder of thyroid, unspecified: Secondary | ICD-10-CM | POA: Diagnosis not present

## 2022-07-10 DIAGNOSIS — S81811A Laceration without foreign body, right lower leg, initial encounter: Secondary | ICD-10-CM | POA: Diagnosis not present

## 2022-07-10 DIAGNOSIS — Z881 Allergy status to other antibiotic agents status: Secondary | ICD-10-CM | POA: Diagnosis not present

## 2022-07-10 DIAGNOSIS — Z515 Encounter for palliative care: Secondary | ICD-10-CM | POA: Diagnosis not present

## 2022-07-10 DIAGNOSIS — R54 Age-related physical debility: Secondary | ICD-10-CM | POA: Diagnosis not present

## 2022-07-10 DIAGNOSIS — E039 Hypothyroidism, unspecified: Secondary | ICD-10-CM | POA: Diagnosis not present

## 2022-07-10 DIAGNOSIS — E870 Hyperosmolality and hypernatremia: Secondary | ICD-10-CM | POA: Diagnosis not present

## 2022-07-10 DIAGNOSIS — R279 Unspecified lack of coordination: Secondary | ICD-10-CM | POA: Diagnosis not present

## 2022-07-10 DIAGNOSIS — I700 Atherosclerosis of aorta: Secondary | ICD-10-CM | POA: Diagnosis not present

## 2022-07-10 DIAGNOSIS — M6281 Muscle weakness (generalized): Secondary | ICD-10-CM | POA: Diagnosis not present

## 2022-07-10 DIAGNOSIS — R0602 Shortness of breath: Secondary | ICD-10-CM | POA: Diagnosis not present

## 2022-07-10 DIAGNOSIS — R58 Hemorrhage, not elsewhere classified: Secondary | ICD-10-CM | POA: Diagnosis not present

## 2022-07-10 DIAGNOSIS — R2689 Other abnormalities of gait and mobility: Secondary | ICD-10-CM | POA: Diagnosis not present

## 2022-07-10 DIAGNOSIS — Z681 Body mass index (BMI) 19 or less, adult: Secondary | ICD-10-CM | POA: Diagnosis not present

## 2022-07-10 DIAGNOSIS — W268XXA Contact with other sharp object(s), not elsewhere classified, initial encounter: Secondary | ICD-10-CM | POA: Diagnosis not present

## 2022-07-10 DIAGNOSIS — I10 Essential (primary) hypertension: Secondary | ICD-10-CM | POA: Diagnosis not present

## 2022-07-10 DIAGNOSIS — K5289 Other specified noninfective gastroenteritis and colitis: Secondary | ICD-10-CM | POA: Diagnosis not present

## 2022-07-10 DIAGNOSIS — R0902 Hypoxemia: Secondary | ICD-10-CM | POA: Diagnosis not present

## 2022-07-10 DIAGNOSIS — Z66 Do not resuscitate: Secondary | ICD-10-CM | POA: Diagnosis not present

## 2022-07-10 DIAGNOSIS — N390 Urinary tract infection, site not specified: Secondary | ICD-10-CM | POA: Diagnosis not present

## 2022-07-10 DIAGNOSIS — R6889 Other general symptoms and signs: Secondary | ICD-10-CM | POA: Diagnosis not present

## 2022-07-10 DIAGNOSIS — K5641 Fecal impaction: Secondary | ICD-10-CM | POA: Diagnosis not present

## 2022-07-10 DIAGNOSIS — E860 Dehydration: Secondary | ICD-10-CM | POA: Diagnosis not present

## 2022-07-10 DIAGNOSIS — K5733 Diverticulitis of large intestine without perforation or abscess with bleeding: Secondary | ICD-10-CM | POA: Diagnosis not present

## 2022-07-10 DIAGNOSIS — N179 Acute kidney failure, unspecified: Secondary | ICD-10-CM | POA: Diagnosis not present

## 2022-07-10 DIAGNOSIS — E440 Moderate protein-calorie malnutrition: Secondary | ICD-10-CM | POA: Diagnosis not present

## 2022-07-10 DIAGNOSIS — E038 Other specified hypothyroidism: Secondary | ICD-10-CM | POA: Diagnosis not present

## 2022-07-10 DIAGNOSIS — R627 Adult failure to thrive: Secondary | ICD-10-CM | POA: Diagnosis not present

## 2022-07-10 DIAGNOSIS — Z1152 Encounter for screening for COVID-19: Secondary | ICD-10-CM | POA: Diagnosis not present

## 2022-07-10 DIAGNOSIS — Z7902 Long term (current) use of antithrombotics/antiplatelets: Secondary | ICD-10-CM | POA: Diagnosis not present

## 2022-07-10 DIAGNOSIS — I5032 Chronic diastolic (congestive) heart failure: Secondary | ICD-10-CM | POA: Diagnosis not present

## 2022-07-21 ENCOUNTER — Other Ambulatory Visit: Payer: Self-pay | Admitting: *Deleted

## 2022-07-21 NOTE — Patient Outreach (Signed)
[facility] Post- Acute Care Coordinator follow up. Mrs. Mikkelson resides in [facility].   Previous update from SNF SW indicating transition plan is for LTC.   No identifiable [facility] care coordination needs at this time.   Marthenia Rolling, MSN, RN,BSN [place] Acute Care Coordinator [phone] (Direct dial)

## 2022-07-23 DIAGNOSIS — I130 Hypertensive heart and chronic kidney disease with heart failure and stage 1 through stage 4 chronic kidney disease, or unspecified chronic kidney disease: Secondary | ICD-10-CM | POA: Diagnosis not present

## 2022-07-23 DIAGNOSIS — R279 Unspecified lack of coordination: Secondary | ICD-10-CM | POA: Diagnosis not present

## 2022-07-23 DIAGNOSIS — R109 Unspecified abdominal pain: Secondary | ICD-10-CM | POA: Diagnosis not present

## 2022-07-23 DIAGNOSIS — Z881 Allergy status to other antibiotic agents status: Secondary | ICD-10-CM | POA: Diagnosis not present

## 2022-07-23 DIAGNOSIS — E870 Hyperosmolality and hypernatremia: Secondary | ICD-10-CM | POA: Diagnosis not present

## 2022-07-23 DIAGNOSIS — R030 Elevated blood-pressure reading, without diagnosis of hypertension: Secondary | ICD-10-CM | POA: Diagnosis not present

## 2022-07-23 DIAGNOSIS — I5032 Chronic diastolic (congestive) heart failure: Secondary | ICD-10-CM | POA: Diagnosis not present

## 2022-07-23 DIAGNOSIS — R7989 Other specified abnormal findings of blood chemistry: Secondary | ICD-10-CM | POA: Diagnosis not present

## 2022-07-23 DIAGNOSIS — I700 Atherosclerosis of aorta: Secondary | ICD-10-CM | POA: Diagnosis not present

## 2022-07-23 DIAGNOSIS — Z792 Long term (current) use of antibiotics: Secondary | ICD-10-CM | POA: Diagnosis not present

## 2022-07-23 DIAGNOSIS — Z7902 Long term (current) use of antithrombotics/antiplatelets: Secondary | ICD-10-CM | POA: Diagnosis not present

## 2022-07-23 DIAGNOSIS — K5289 Other specified noninfective gastroenteritis and colitis: Secondary | ICD-10-CM | POA: Diagnosis not present

## 2022-07-23 DIAGNOSIS — Z515 Encounter for palliative care: Secondary | ICD-10-CM | POA: Diagnosis not present

## 2022-07-23 DIAGNOSIS — I129 Hypertensive chronic kidney disease with stage 1 through stage 4 chronic kidney disease, or unspecified chronic kidney disease: Secondary | ICD-10-CM | POA: Diagnosis not present

## 2022-07-23 DIAGNOSIS — K5641 Fecal impaction: Secondary | ICD-10-CM | POA: Diagnosis not present

## 2022-07-23 DIAGNOSIS — S81811A Laceration without foreign body, right lower leg, initial encounter: Secondary | ICD-10-CM | POA: Diagnosis not present

## 2022-07-23 DIAGNOSIS — K5733 Diverticulitis of large intestine without perforation or abscess with bleeding: Secondary | ICD-10-CM | POA: Diagnosis not present

## 2022-07-23 DIAGNOSIS — E860 Dehydration: Secondary | ICD-10-CM | POA: Diagnosis not present

## 2022-07-23 DIAGNOSIS — R8271 Bacteriuria: Secondary | ICD-10-CM | POA: Diagnosis not present

## 2022-07-23 DIAGNOSIS — R6889 Other general symptoms and signs: Secondary | ICD-10-CM | POA: Diagnosis not present

## 2022-07-23 DIAGNOSIS — R58 Hemorrhage, not elsewhere classified: Secondary | ICD-10-CM | POA: Diagnosis not present

## 2022-07-23 DIAGNOSIS — Z681 Body mass index (BMI) 19 or less, adult: Secondary | ICD-10-CM | POA: Diagnosis not present

## 2022-07-23 DIAGNOSIS — D72829 Elevated white blood cell count, unspecified: Secondary | ICD-10-CM | POA: Diagnosis not present

## 2022-07-23 DIAGNOSIS — R5381 Other malaise: Secondary | ICD-10-CM | POA: Diagnosis not present

## 2022-07-23 DIAGNOSIS — N390 Urinary tract infection, site not specified: Secondary | ICD-10-CM | POA: Diagnosis not present

## 2022-07-23 DIAGNOSIS — N3000 Acute cystitis without hematuria: Secondary | ICD-10-CM | POA: Diagnosis not present

## 2022-07-23 DIAGNOSIS — N179 Acute kidney failure, unspecified: Secondary | ICD-10-CM | POA: Diagnosis not present

## 2022-07-23 DIAGNOSIS — K922 Gastrointestinal hemorrhage, unspecified: Secondary | ICD-10-CM | POA: Diagnosis not present

## 2022-07-23 DIAGNOSIS — N1330 Unspecified hydronephrosis: Secondary | ICD-10-CM | POA: Diagnosis not present

## 2022-07-23 DIAGNOSIS — R531 Weakness: Secondary | ICD-10-CM | POA: Diagnosis not present

## 2022-07-23 DIAGNOSIS — Z66 Do not resuscitate: Secondary | ICD-10-CM | POA: Diagnosis not present

## 2022-07-23 DIAGNOSIS — E079 Disorder of thyroid, unspecified: Secondary | ICD-10-CM | POA: Diagnosis not present

## 2022-07-23 DIAGNOSIS — R54 Age-related physical debility: Secondary | ICD-10-CM | POA: Diagnosis not present

## 2022-07-23 DIAGNOSIS — E440 Moderate protein-calorie malnutrition: Secondary | ICD-10-CM | POA: Diagnosis not present

## 2022-07-23 DIAGNOSIS — W268XXA Contact with other sharp object(s), not elsewhere classified, initial encounter: Secondary | ICD-10-CM | POA: Diagnosis not present

## 2022-07-23 DIAGNOSIS — R638 Other symptoms and signs concerning food and fluid intake: Secondary | ICD-10-CM | POA: Diagnosis not present

## 2022-07-23 DIAGNOSIS — Z743 Need for continuous supervision: Secondary | ICD-10-CM | POA: Diagnosis not present

## 2022-07-23 DIAGNOSIS — R0602 Shortness of breath: Secondary | ICD-10-CM | POA: Diagnosis not present

## 2022-07-23 DIAGNOSIS — Z1152 Encounter for screening for COVID-19: Secondary | ICD-10-CM | POA: Diagnosis not present

## 2022-07-23 DIAGNOSIS — Z7982 Long term (current) use of aspirin: Secondary | ICD-10-CM | POA: Diagnosis not present

## 2022-07-23 DIAGNOSIS — E785 Hyperlipidemia, unspecified: Secondary | ICD-10-CM | POA: Diagnosis not present

## 2022-07-23 DIAGNOSIS — N183 Chronic kidney disease, stage 3 unspecified: Secondary | ICD-10-CM | POA: Diagnosis not present

## 2022-07-23 DIAGNOSIS — R627 Adult failure to thrive: Secondary | ICD-10-CM | POA: Diagnosis not present

## 2022-07-23 DIAGNOSIS — Z7401 Bed confinement status: Secondary | ICD-10-CM | POA: Diagnosis not present

## 2022-07-25 DIAGNOSIS — R0602 Shortness of breath: Secondary | ICD-10-CM | POA: Diagnosis not present

## 2022-07-25 DIAGNOSIS — R8271 Bacteriuria: Secondary | ICD-10-CM | POA: Diagnosis not present

## 2022-07-25 DIAGNOSIS — N390 Urinary tract infection, site not specified: Secondary | ICD-10-CM | POA: Diagnosis not present

## 2022-07-25 DIAGNOSIS — Z66 Do not resuscitate: Secondary | ICD-10-CM | POA: Diagnosis not present

## 2022-07-25 DIAGNOSIS — K5641 Fecal impaction: Secondary | ICD-10-CM | POA: Diagnosis not present

## 2022-07-25 DIAGNOSIS — I700 Atherosclerosis of aorta: Secondary | ICD-10-CM | POA: Diagnosis not present

## 2022-07-25 DIAGNOSIS — R109 Unspecified abdominal pain: Secondary | ICD-10-CM | POA: Diagnosis not present

## 2022-07-25 DIAGNOSIS — N1330 Unspecified hydronephrosis: Secondary | ICD-10-CM | POA: Diagnosis not present

## 2022-07-26 DIAGNOSIS — Z792 Long term (current) use of antibiotics: Secondary | ICD-10-CM | POA: Diagnosis not present

## 2022-07-26 DIAGNOSIS — D72829 Elevated white blood cell count, unspecified: Secondary | ICD-10-CM | POA: Diagnosis not present

## 2022-07-26 DIAGNOSIS — N183 Chronic kidney disease, stage 3 unspecified: Secondary | ICD-10-CM | POA: Diagnosis not present

## 2022-07-26 DIAGNOSIS — K5289 Other specified noninfective gastroenteritis and colitis: Secondary | ICD-10-CM | POA: Diagnosis not present

## 2022-07-26 DIAGNOSIS — R030 Elevated blood-pressure reading, without diagnosis of hypertension: Secondary | ICD-10-CM | POA: Diagnosis not present

## 2022-07-26 DIAGNOSIS — K5641 Fecal impaction: Secondary | ICD-10-CM | POA: Diagnosis not present

## 2022-07-26 DIAGNOSIS — R7989 Other specified abnormal findings of blood chemistry: Secondary | ICD-10-CM | POA: Diagnosis not present

## 2022-07-26 DIAGNOSIS — N3000 Acute cystitis without hematuria: Secondary | ICD-10-CM | POA: Diagnosis not present

## 2022-07-27 DIAGNOSIS — N3000 Acute cystitis without hematuria: Secondary | ICD-10-CM | POA: Diagnosis not present

## 2022-07-27 DIAGNOSIS — Z792 Long term (current) use of antibiotics: Secondary | ICD-10-CM | POA: Diagnosis not present

## 2022-07-27 DIAGNOSIS — K5289 Other specified noninfective gastroenteritis and colitis: Secondary | ICD-10-CM | POA: Diagnosis not present

## 2022-07-27 DIAGNOSIS — R030 Elevated blood-pressure reading, without diagnosis of hypertension: Secondary | ICD-10-CM | POA: Diagnosis not present

## 2022-07-27 DIAGNOSIS — N183 Chronic kidney disease, stage 3 unspecified: Secondary | ICD-10-CM | POA: Diagnosis not present

## 2022-07-27 DIAGNOSIS — R7989 Other specified abnormal findings of blood chemistry: Secondary | ICD-10-CM | POA: Diagnosis not present

## 2022-07-28 DIAGNOSIS — E870 Hyperosmolality and hypernatremia: Secondary | ICD-10-CM | POA: Diagnosis not present

## 2022-07-28 DIAGNOSIS — E860 Dehydration: Secondary | ICD-10-CM | POA: Diagnosis not present

## 2022-07-28 DIAGNOSIS — K5289 Other specified noninfective gastroenteritis and colitis: Secondary | ICD-10-CM | POA: Diagnosis not present

## 2022-07-28 DIAGNOSIS — N183 Chronic kidney disease, stage 3 unspecified: Secondary | ICD-10-CM | POA: Diagnosis not present

## 2022-07-28 DIAGNOSIS — R030 Elevated blood-pressure reading, without diagnosis of hypertension: Secondary | ICD-10-CM | POA: Diagnosis not present

## 2022-07-28 DIAGNOSIS — R627 Adult failure to thrive: Secondary | ICD-10-CM | POA: Diagnosis not present

## 2022-07-28 DIAGNOSIS — R638 Other symptoms and signs concerning food and fluid intake: Secondary | ICD-10-CM | POA: Diagnosis not present

## 2022-07-28 DIAGNOSIS — K922 Gastrointestinal hemorrhage, unspecified: Secondary | ICD-10-CM | POA: Diagnosis not present

## 2022-07-28 DIAGNOSIS — N3000 Acute cystitis without hematuria: Secondary | ICD-10-CM | POA: Diagnosis not present

## 2022-07-28 DIAGNOSIS — R7989 Other specified abnormal findings of blood chemistry: Secondary | ICD-10-CM | POA: Diagnosis not present

## 2022-07-29 DIAGNOSIS — N3000 Acute cystitis without hematuria: Secondary | ICD-10-CM | POA: Diagnosis not present

## 2022-07-29 DIAGNOSIS — R638 Other symptoms and signs concerning food and fluid intake: Secondary | ICD-10-CM | POA: Diagnosis not present

## 2022-07-29 DIAGNOSIS — K5289 Other specified noninfective gastroenteritis and colitis: Secondary | ICD-10-CM | POA: Diagnosis not present

## 2022-07-29 DIAGNOSIS — R54 Age-related physical debility: Secondary | ICD-10-CM | POA: Diagnosis not present

## 2022-07-29 DIAGNOSIS — R627 Adult failure to thrive: Secondary | ICD-10-CM | POA: Diagnosis not present

## 2022-07-29 DIAGNOSIS — N183 Chronic kidney disease, stage 3 unspecified: Secondary | ICD-10-CM | POA: Diagnosis not present

## 2022-07-29 DIAGNOSIS — K922 Gastrointestinal hemorrhage, unspecified: Secondary | ICD-10-CM | POA: Diagnosis not present

## 2022-07-29 DIAGNOSIS — I129 Hypertensive chronic kidney disease with stage 1 through stage 4 chronic kidney disease, or unspecified chronic kidney disease: Secondary | ICD-10-CM | POA: Diagnosis not present

## 2022-07-29 DIAGNOSIS — Z792 Long term (current) use of antibiotics: Secondary | ICD-10-CM | POA: Diagnosis not present

## 2022-07-30 DIAGNOSIS — R54 Age-related physical debility: Secondary | ICD-10-CM | POA: Diagnosis not present

## 2022-07-30 DIAGNOSIS — N3000 Acute cystitis without hematuria: Secondary | ICD-10-CM | POA: Diagnosis not present

## 2022-07-30 DIAGNOSIS — R638 Other symptoms and signs concerning food and fluid intake: Secondary | ICD-10-CM | POA: Diagnosis not present

## 2022-07-30 DIAGNOSIS — N183 Chronic kidney disease, stage 3 unspecified: Secondary | ICD-10-CM | POA: Diagnosis not present

## 2022-07-30 DIAGNOSIS — R627 Adult failure to thrive: Secondary | ICD-10-CM | POA: Diagnosis not present

## 2022-07-30 DIAGNOSIS — K922 Gastrointestinal hemorrhage, unspecified: Secondary | ICD-10-CM | POA: Diagnosis not present

## 2022-07-30 DIAGNOSIS — I129 Hypertensive chronic kidney disease with stage 1 through stage 4 chronic kidney disease, or unspecified chronic kidney disease: Secondary | ICD-10-CM | POA: Diagnosis not present

## 2022-07-30 DIAGNOSIS — K5289 Other specified noninfective gastroenteritis and colitis: Secondary | ICD-10-CM | POA: Diagnosis not present

## 2022-07-30 DIAGNOSIS — Z792 Long term (current) use of antibiotics: Secondary | ICD-10-CM | POA: Diagnosis not present

## 2022-07-31 DIAGNOSIS — E870 Hyperosmolality and hypernatremia: Secondary | ICD-10-CM | POA: Diagnosis not present

## 2022-07-31 DIAGNOSIS — E440 Moderate protein-calorie malnutrition: Secondary | ICD-10-CM | POA: Diagnosis not present

## 2022-07-31 DIAGNOSIS — I129 Hypertensive chronic kidney disease with stage 1 through stage 4 chronic kidney disease, or unspecified chronic kidney disease: Secondary | ICD-10-CM | POA: Diagnosis not present

## 2022-07-31 DIAGNOSIS — K922 Gastrointestinal hemorrhage, unspecified: Secondary | ICD-10-CM | POA: Diagnosis not present

## 2022-07-31 DIAGNOSIS — R54 Age-related physical debility: Secondary | ICD-10-CM | POA: Diagnosis not present

## 2022-07-31 DIAGNOSIS — R638 Other symptoms and signs concerning food and fluid intake: Secondary | ICD-10-CM | POA: Diagnosis not present

## 2022-07-31 DIAGNOSIS — K5641 Fecal impaction: Secondary | ICD-10-CM | POA: Diagnosis not present

## 2022-07-31 DIAGNOSIS — R627 Adult failure to thrive: Secondary | ICD-10-CM | POA: Diagnosis not present

## 2022-07-31 DIAGNOSIS — N183 Chronic kidney disease, stage 3 unspecified: Secondary | ICD-10-CM | POA: Diagnosis not present

## 2022-07-31 DIAGNOSIS — N3000 Acute cystitis without hematuria: Secondary | ICD-10-CM | POA: Diagnosis not present

## 2022-07-31 DIAGNOSIS — K5289 Other specified noninfective gastroenteritis and colitis: Secondary | ICD-10-CM | POA: Diagnosis not present

## 2022-08-07 DEATH — deceased

## 2022-08-21 ENCOUNTER — Telehealth: Payer: Self-pay

## 2022-08-21 ENCOUNTER — Encounter: Payer: Medicare Other | Admitting: Family Medicine

## 2022-08-21 NOTE — Telephone Encounter (Signed)
Patient passed away on christmas day- wanted to make you aware
